# Patient Record
Sex: Male | Born: 1940 | Race: White | Hispanic: No | Marital: Married | Smoking: Former smoker
Health system: Southern US, Community
[De-identification: ages and names within clinical notes are randomized; demographics above are authoritative.]

## PROBLEM LIST (undated history)

## (undated) DIAGNOSIS — I509 Heart failure, unspecified: Secondary | ICD-10-CM

## (undated) DIAGNOSIS — E119 Type 2 diabetes mellitus without complications: Secondary | ICD-10-CM

## (undated) DIAGNOSIS — O2230 Deep phlebothrombosis in pregnancy, unspecified trimester: Secondary | ICD-10-CM

## (undated) DIAGNOSIS — G459 Transient cerebral ischemic attack, unspecified: Secondary | ICD-10-CM

## (undated) DIAGNOSIS — K219 Gastro-esophageal reflux disease without esophagitis: Secondary | ICD-10-CM

## (undated) DIAGNOSIS — I10 Essential (primary) hypertension: Secondary | ICD-10-CM

## (undated) DIAGNOSIS — I2510 Atherosclerotic heart disease of native coronary artery without angina pectoris: Secondary | ICD-10-CM

## (undated) DIAGNOSIS — I82409 Acute embolism and thrombosis of unspecified deep veins of unspecified lower extremity: Secondary | ICD-10-CM

## (undated) DIAGNOSIS — I639 Cerebral infarction, unspecified: Secondary | ICD-10-CM

## (undated) DIAGNOSIS — C969 Malignant neoplasm of lymphoid, hematopoietic and related tissue, unspecified: Secondary | ICD-10-CM

## (undated) DIAGNOSIS — Z7901 Long term (current) use of anticoagulants: Secondary | ICD-10-CM

## (undated) HISTORY — PX: CORONARY ANGIOPLASTY WITH STENT PLACEMENT: SHX49

## (undated) HISTORY — PX: BACK SURGERY: SHX140

---
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
## 2015-01-02 ENCOUNTER — Ambulatory Visit (INDEPENDENT_AMBULATORY_CARE_PROVIDER_SITE_OTHER): Payer: Medicare Other | Admitting: Podiatry

## 2015-01-02 ENCOUNTER — Encounter: Payer: Self-pay | Admitting: Podiatry

## 2015-01-02 VITALS — Ht 74.0 in | Wt 260.0 lb

## 2015-01-02 DIAGNOSIS — M2041 Other hammer toe(s) (acquired), right foot: Secondary | ICD-10-CM

## 2015-01-02 DIAGNOSIS — M79671 Pain in right foot: Secondary | ICD-10-CM

## 2015-01-02 NOTE — Progress Notes (Signed)
Subjective: 74 year old male presents complaining of pain in 2nd digit right foot, been going on off and on for the last 3 months. Has had Gout years ago and was treated and got over it.   Objective: Vascular: All pedal pulses are palpable. Mild pedal edema bilateral. Dermatologic: Thick deformed nails x 10. Neurologic: Subjective numbness and dull feeling on both feet. Pain off and on 2nd toe right x 3 months. Orthopedic: Severe HAV with bunion bilateral. Contracted 2nd toe right.  Enlarged 2nd toe right.   Assessment: Painful hammer toe right. Onychomycosis x 10. Painful feet bilateral.  Plan: May benefit from resection of enlarged phalangeal head. Need to have [place].

## 2015-01-02 NOTE — Patient Instructions (Signed)
Seen for painful toe 2nd right. Need to have nails trimmed in next appointment.

## 2015-01-11 ENCOUNTER — Ambulatory Visit (INDEPENDENT_AMBULATORY_CARE_PROVIDER_SITE_OTHER): Payer: Medicare Other | Admitting: Podiatry

## 2015-01-11 ENCOUNTER — Encounter: Payer: Self-pay | Admitting: Podiatry

## 2015-01-11 VITALS — Ht 74.0 in | Wt 260.0 lb

## 2015-01-11 DIAGNOSIS — B351 Tinea unguium: Secondary | ICD-10-CM | POA: Insufficient documentation

## 2015-01-11 DIAGNOSIS — M79606 Pain in leg, unspecified: Secondary | ICD-10-CM

## 2015-01-11 DIAGNOSIS — M79604 Pain in right leg: Secondary | ICD-10-CM

## 2015-01-11 DIAGNOSIS — M10071 Idiopathic gout, right ankle and foot: Secondary | ICD-10-CM

## 2015-01-11 DIAGNOSIS — M109 Gout, unspecified: Secondary | ICD-10-CM | POA: Insufficient documentation

## 2015-01-11 NOTE — Patient Instructions (Signed)
Seen for hypertrophic nails. Cortisone injection given to right 2nd toe.  All nails debrided. Return in 3 months or as needed.

## 2015-01-11 NOTE — Progress Notes (Signed)
Subjective: 74 year old male presents complaining of pain in 2nd digit right foot, and request for toe nails trimmed. He attempted to trim and ended up bleeding toes. He is on blood thinner.   Objective: Vascular: All pedal pulses are palpable. Mild pedal edema bilateral. Dermatologic: Thick deformed nails x 10. Neurologic: Subjective numbness and dull feeling on both feet. Pain off and on 2nd toe right x 3 months. Orthopedic: Severe HAV with bunion bilateral. Contracted 2nd toe right.  Enlarged 2nd toe right.   Assessment: Painful hammer toe right. Onychomycosis x 10. Painful feet bilateral. R/O Gouty arthritis 2nd digit right.   Plan: 2nd digit right foot injected with mixture of 4 mg Dexamethasone and  1 cc of 0.5% Marcaine plain. Patient tolerated well without difficulty.  All nails debrided.

## 2015-02-20 ENCOUNTER — Ambulatory Visit (INDEPENDENT_AMBULATORY_CARE_PROVIDER_SITE_OTHER): Payer: Medicare Other | Admitting: Podiatry

## 2015-02-20 ENCOUNTER — Encounter: Payer: Self-pay | Admitting: Podiatry

## 2015-02-20 DIAGNOSIS — D2121 Benign neoplasm of connective and other soft tissue of right lower limb, including hip: Secondary | ICD-10-CM | POA: Diagnosis not present

## 2015-02-20 DIAGNOSIS — D2120 Benign neoplasm of connective and other soft tissue of unspecified lower limb, including hip: Secondary | ICD-10-CM | POA: Insufficient documentation

## 2015-02-20 DIAGNOSIS — M79671 Pain in right foot: Secondary | ICD-10-CM

## 2015-02-20 NOTE — Patient Instructions (Signed)
Seen for right foot lesion over the 2nd toe base. Possible insect bite? Localized soft tissue swelling.  Will do hammer toe surgery on 2nd right when scheduled.

## 2015-02-20 NOTE — Progress Notes (Signed)
Subjective: 74 year old male presents complaining of a red lesion at the base of 2nd toe right for the past few days. It was hurting till yesterday and not hurting any more today.  Patient wants to have the hammer toe surgery on the 2nd right.   Objective:  Seen raised skin with pink discoloration. No skin break down. No drainage.  No associated edema on toe. No pain at this time.  Assessment: Cellular reaction with possible insect bite.  Plan:  Reviewed finding. Will discuss hammer toe surgery when he returns for the pre op.

## 2015-04-10 ENCOUNTER — Ambulatory Visit: Payer: Medicare Other | Admitting: Podiatry

## 2015-04-12 ENCOUNTER — Ambulatory Visit: Payer: No Typology Code available for payment source | Admitting: Podiatry

## 2015-05-19 ENCOUNTER — Ambulatory Visit (INDEPENDENT_AMBULATORY_CARE_PROVIDER_SITE_OTHER): Payer: Medicare Other | Admitting: Podiatry

## 2015-05-19 ENCOUNTER — Encounter: Payer: Self-pay | Admitting: Podiatry

## 2015-05-19 VITALS — Ht 74.0 in | Wt 262.0 lb

## 2015-05-19 DIAGNOSIS — M79606 Pain in leg, unspecified: Secondary | ICD-10-CM

## 2015-05-19 DIAGNOSIS — B351 Tinea unguium: Secondary | ICD-10-CM | POA: Diagnosis not present

## 2015-05-19 NOTE — Patient Instructions (Signed)
Seen for hypertrophic nails. All nails debrided. Return in 3 months or as needed.  

## 2015-05-19 NOTE — Progress Notes (Signed)
Subjective: 74 year old male presents complaining of painful nails.   Objective: Vascular: All pedal pulses are palpable. Mild pedal edema bilateral. Dermatologic: Thick deformed nails x 10. Neurologic: Subjective numbness and dull feeling on both feet. Pain off and on 2nd toe right x 3 months. Orthopedic: Severe HAV with bunion bilateral. Contracted 2nd toe right.   Assessment: Painful hammer toe right. Onychomycosis x 10. Painful feet bilateral.  Plan: Debrided all nails x 10.

## 2015-09-06 ENCOUNTER — Ambulatory Visit (INDEPENDENT_AMBULATORY_CARE_PROVIDER_SITE_OTHER): Payer: Medicare Other | Admitting: Podiatry

## 2015-09-06 ENCOUNTER — Encounter: Payer: Self-pay | Admitting: Podiatry

## 2015-09-06 VITALS — BP 145/65 | HR 74

## 2015-09-06 DIAGNOSIS — M79606 Pain in leg, unspecified: Secondary | ICD-10-CM

## 2015-09-06 DIAGNOSIS — B351 Tinea unguium: Secondary | ICD-10-CM

## 2015-09-06 NOTE — Progress Notes (Signed)
Subjective: 74 year old male presents complaining of painful thick nails. Requesting nails trimmed.  Objective: Vascular: All pedal pulses are palpable. Mild pedal edema bilateral. Dermatologic: Thick deformed nails x 10. Neurologic: Subjective numbness and dull feeling on both feet. Pain off and on 2nd toe right x 3 months. Orthopedic: Severe HAV with bunion bilateral. Contracted 2nd toe right.   Assessment: Painful hammer toe right. Onychomycosis x 10. Painful feet bilateral.  Plan: Debrided all nails x 10

## 2016-01-22 ENCOUNTER — Encounter: Payer: Self-pay | Admitting: Podiatry

## 2016-01-22 ENCOUNTER — Ambulatory Visit (INDEPENDENT_AMBULATORY_CARE_PROVIDER_SITE_OTHER): Payer: Medicare Other | Admitting: Podiatry

## 2016-01-22 VITALS — BP 143/65 | HR 70

## 2016-01-22 DIAGNOSIS — B351 Tinea unguium: Secondary | ICD-10-CM | POA: Diagnosis not present

## 2016-01-22 DIAGNOSIS — M79606 Pain in leg, unspecified: Secondary | ICD-10-CM | POA: Diagnosis not present

## 2016-01-22 NOTE — Progress Notes (Signed)
Subjective: 76 year old male presents complaining of painful thick nails. Requesting nails trimmed. Also requests his old skin cream prescribed. He had used for 10 years as skin cracks and peels off.  Objective: Vascular: All pedal pulses are palpable. Mild pedal edema bilateral. Dermatologic: Thick deformed nails x 10. Neurologic: Subjective numbness and dull feeling on both feet. Pain off and on 2nd toe right x 3 months. Orthopedic: Severe HAV with bunion bilateral. Contracted 2nd toe right.   Assessment: Painful hammer toe right. Onychomycosis x 10. Painful feet bilateral.  Plan: Debrided all nails x 10. According to existing container, prescribed Triamcinolone 0.1% mixed with Eucerin cream 1:3 ratio, 454 gm. With 3 refills.

## 2016-01-22 NOTE — Patient Instructions (Signed)
Seen for hypertrophic nails. All nails debrided. Return in 3 months or as needed.  

## 2016-04-24 ENCOUNTER — Ambulatory Visit (INDEPENDENT_AMBULATORY_CARE_PROVIDER_SITE_OTHER): Payer: Medicare Other | Admitting: Podiatry

## 2016-04-24 ENCOUNTER — Encounter: Payer: Self-pay | Admitting: Podiatry

## 2016-04-24 VITALS — BP 130/74 | HR 66

## 2016-04-24 DIAGNOSIS — M79606 Pain in leg, unspecified: Secondary | ICD-10-CM

## 2016-04-24 DIAGNOSIS — B351 Tinea unguium: Secondary | ICD-10-CM | POA: Diagnosis not present

## 2016-04-24 NOTE — Progress Notes (Signed)
Subjective: 75 year old male presents complaining of painful thick nails. Requesting nails trimmed. Been using Cortisone skin cream as needed.  Patient is on anti coagulant therapy.  Objective: Vascular: All pedal pulses are palpable. Hyperpigmented lower limbs bilateral.  Dermatologic: Thick deformed nails x 10. Neurologic: Subjective numbness and dull feeling on both feet. Pain off and on 2nd toe right x 3 months. Orthopedic: Severe HAV with bunion bilateral. Contracted 2nd toe right.   Assessment: Painful hammer toe 2nd right. Onychomycosis x 10. Painful feet bilateral.  Plan: Debrided all nails x 10. Bleeding right great medial border cleansed and dressed with Betadine solution.  Return as needed.

## 2016-04-24 NOTE — Patient Instructions (Signed)
Seen for hypertrophic nails. All nails debrided. Betadine dressing applied to bleeding right great toe. Return in 3 months or as needed.

## 2016-08-14 ENCOUNTER — Ambulatory Visit (INDEPENDENT_AMBULATORY_CARE_PROVIDER_SITE_OTHER): Payer: Medicare Other | Admitting: Podiatry

## 2016-08-14 ENCOUNTER — Encounter: Payer: Self-pay | Admitting: Podiatry

## 2016-08-14 VITALS — BP 130/70 | HR 66

## 2016-08-14 DIAGNOSIS — M79606 Pain in leg, unspecified: Secondary | ICD-10-CM | POA: Diagnosis not present

## 2016-08-14 DIAGNOSIS — B351 Tinea unguium: Secondary | ICD-10-CM

## 2016-08-14 NOTE — Progress Notes (Signed)
Subjective: 75 year old male presents complaining of painful thick nails. Requesting nails trimmed. Been using Cortisone skin cream as needed.  Patient is on anti coagulant therapy.  Objective: Vascular: All pedal pulses are palpable. Hyperpigmented lower limbs bilateral.  Dermatologic: Thick deformed nails x 10. Neurologic: Subjective numbness and dull feeling on both feet. Pain off and on 2nd toe right x 3 months. Orthopedic: Severe HAV with bunion bilateral. Contracted 2nd toe right.   Assessment: Painful hammer toe 2nd right. Onychomycosis x 10. Painful feet bilateral.  Plan: Debrided all nails x 10. Bleeding right great medial border cleansed and dressed with Betadine solution.  Return as needed.

## 2016-08-14 NOTE — Patient Instructions (Signed)
Seen for hypertrophic nails. All nails debrided. Return in 3 months or as needed.  

## 2016-11-13 ENCOUNTER — Encounter: Payer: Self-pay | Admitting: Podiatry

## 2016-11-13 ENCOUNTER — Ambulatory Visit (INDEPENDENT_AMBULATORY_CARE_PROVIDER_SITE_OTHER): Payer: Medicare Other | Admitting: Podiatry

## 2016-11-13 DIAGNOSIS — M79671 Pain in right foot: Secondary | ICD-10-CM | POA: Diagnosis not present

## 2016-11-13 DIAGNOSIS — M79606 Pain in leg, unspecified: Secondary | ICD-10-CM

## 2016-11-13 DIAGNOSIS — B351 Tinea unguium: Secondary | ICD-10-CM | POA: Diagnosis not present

## 2016-11-13 NOTE — Patient Instructions (Signed)
Seen for hypertrophic nails. All nails debrided. Return in 3 months or as needed.  

## 2016-11-14 NOTE — Progress Notes (Signed)
Subjective: 75 year old male presents complaining of painful thick nails. Requesting nails trimmed.  Objective: Vascular: All pedal pulses are palpable. Hyperpigmented lower limbs bilateral.  Dermatologic: Thick deformed nails x 10. Thin hyperpigmented skin in both feet from Venous stasis and anticoagulant therapy.  Neurologic: Subjective numbness and dull feeling on both feet. Pain off and on 2nd toe right x 3 months. Orthopedic: Severe HAV with bunion bilateral. Contracted 2nd toe right.   Assessment: Painful hammer toe 2nd right. Onychomycosis x 10. Painful feet bilateral.  Plan: Debrided all nails x 10. Return as needed.

## 2017-03-12 ENCOUNTER — Encounter: Payer: Self-pay | Admitting: Podiatry

## 2017-03-12 ENCOUNTER — Ambulatory Visit (INDEPENDENT_AMBULATORY_CARE_PROVIDER_SITE_OTHER): Payer: Medicare Other | Admitting: Podiatry

## 2017-03-12 ENCOUNTER — Ambulatory Visit: Payer: Medicare Other | Admitting: Podiatry

## 2017-03-12 DIAGNOSIS — M79673 Pain in unspecified foot: Secondary | ICD-10-CM

## 2017-03-12 DIAGNOSIS — B351 Tinea unguium: Secondary | ICD-10-CM | POA: Diagnosis not present

## 2017-03-12 DIAGNOSIS — M79606 Pain in leg, unspecified: Secondary | ICD-10-CM

## 2017-03-12 NOTE — Patient Instructions (Signed)
Seen for hypertrophic nails. All nails debrided. Return in 3 months or as needed.  

## 2017-03-12 NOTE — Progress Notes (Signed)
Subjective: 76 year old male presents complaining of painful thick nails. Requesting nails trimmed. Still using Cortisone cream on feet when skin flares up.   Objective: Vascular: All pedal pulses are palpable. Hyperpigmented lower limbs bilateral.  Dermatologic: Thick deformed nails x 10. Thin hyperpigmented skin in both feet from Venous stasis and anticoagulant therapy.  Neurologic: Subjective numbness and dull feeling on both feet. Pain off and on at the medial base with firm dark brown mass about 0.3 cm in diameter 2nd toe right x 3 months. Orthopedic: Severe HAV with bunion bilateral. Contracted 2nd toe right.   Assessment: Painful hammer toe with possible fibrous angioma medial base 2nd right. Onychomycosis x 10. Painful feet bilateral.  Plan: Debrided all nails x 10. Applied Vitamin A cream on both lower legs. Return as needed.

## 2017-06-19 ENCOUNTER — Encounter: Payer: Self-pay | Admitting: Podiatry

## 2017-06-19 ENCOUNTER — Ambulatory Visit (INDEPENDENT_AMBULATORY_CARE_PROVIDER_SITE_OTHER): Payer: Medicare Other | Admitting: Podiatry

## 2017-06-19 DIAGNOSIS — M79606 Pain in leg, unspecified: Secondary | ICD-10-CM | POA: Diagnosis not present

## 2017-06-19 DIAGNOSIS — B351 Tinea unguium: Secondary | ICD-10-CM | POA: Diagnosis not present

## 2017-06-19 NOTE — Progress Notes (Signed)
Subjective: 76 year old male presents complaining of painful thick nails. Requesting nails trimmed. Denies any new problems.  Objective: Vascular: All pedal pulses are palpable. Hyperpigmented lower limbs bilateral.  Dermatologic: Thick deformed nails x 10. Thin hyperpigmented skin in both feet from Venous stasis and anticoagulant therapy.  Neurologic: Subjective numbness and dull feeling on both feet. Orthopedic: Severe HAV with bunion bilateral. Contracted 2nd toe right.   Assessment: Onychomycosis x 10. Painful feet bilateral.  Plan: Debrided all nails x 10. Return as needed.

## 2017-06-19 NOTE — Patient Instructions (Signed)
Seen for hypertrophic nails. All nails debrided. Return in 3 months or as needed.  

## 2017-09-18 ENCOUNTER — Encounter: Payer: Self-pay | Admitting: Podiatry

## 2017-09-18 ENCOUNTER — Ambulatory Visit (INDEPENDENT_AMBULATORY_CARE_PROVIDER_SITE_OTHER): Payer: Medicare Other | Admitting: Podiatry

## 2017-09-18 DIAGNOSIS — M79606 Pain in leg, unspecified: Secondary | ICD-10-CM

## 2017-09-18 DIAGNOSIS — B351 Tinea unguium: Secondary | ICD-10-CM

## 2017-09-18 NOTE — Progress Notes (Signed)
Subjective: 76 y.o. year old male patient presents complaining of painful nails. Patient requests toe nails trimmed.  Denies any new problem other than on going neuropathy foot pain.  Objective: Dermatologic: Thick yellow deformed nails x 10. Vascular: Pedal pulses are all palpable. Hyperpigmented purple and red skin with poor skin texture from venous stasis and anti coagulant theatment. Orthopedic: Severe bunion deformity bilateral. Contracted 2nd digit right. Neurologic: Subjective numbness, tingling and pain bilateral.  Assessment: Dystrophic mycotic nails x 10. Peripheral neuropathy. Chronic Venous stasis. On anti coagulant therapy.  Treatment: All mycotic nails debrided and grinded.  Return in 3 months or as needed.

## 2017-09-18 NOTE — Patient Instructions (Signed)
Seen for hypertrophic nails. All nails debrided. Return in 3 months or as needed.  

## 2017-12-18 ENCOUNTER — Encounter: Payer: Self-pay | Admitting: Podiatry

## 2017-12-18 ENCOUNTER — Ambulatory Visit (INDEPENDENT_AMBULATORY_CARE_PROVIDER_SITE_OTHER): Payer: Medicare Other | Admitting: Podiatry

## 2017-12-18 DIAGNOSIS — M79672 Pain in left foot: Secondary | ICD-10-CM | POA: Diagnosis not present

## 2017-12-18 DIAGNOSIS — M79671 Pain in right foot: Secondary | ICD-10-CM | POA: Diagnosis not present

## 2017-12-18 DIAGNOSIS — B351 Tinea unguium: Secondary | ICD-10-CM | POA: Diagnosis not present

## 2017-12-18 NOTE — Patient Instructions (Signed)
Seen for hypertrophic nails. All nails debrided. Return in 3 months or sooner if needed.  

## 2017-12-18 NOTE — Progress Notes (Signed)
Subjective: 77 y.o. year old male patient presents complaining of painful nails and request to be trimmed. Currently having on going neuropathy foot pain. On anticoagulant therapy.  Objective: Dermatologic: Thick yellow deformed nails x 10. Vascular: Pedal pulses are all palpable. Hyperpigmented skin with venous stasis dermatitis and anticoagulant therapy. 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 Neurologic: Positive for subjective numbness and tingling with pain on both feet.  Assessment: Dystrophic mycotic nails x 10. Peripheral neuropathy. Venous stasis dermatitis.  Treatment: All mycotic nails debrided.  Return in 3 months or as needed.

## 2018-03-19 ENCOUNTER — Ambulatory Visit (INDEPENDENT_AMBULATORY_CARE_PROVIDER_SITE_OTHER): Payer: Medicare Other | Admitting: Podiatry

## 2018-03-19 DIAGNOSIS — M79672 Pain in left foot: Secondary | ICD-10-CM

## 2018-03-19 DIAGNOSIS — G609 Hereditary and idiopathic neuropathy, unspecified: Secondary | ICD-10-CM | POA: Diagnosis not present

## 2018-03-19 DIAGNOSIS — M79671 Pain in right foot: Secondary | ICD-10-CM

## 2018-03-19 DIAGNOSIS — B351 Tinea unguium: Secondary | ICD-10-CM | POA: Diagnosis not present

## 2018-03-19 NOTE — Patient Instructions (Signed)
Seen for hypertrophic nails. All nails debrided. Return in 3 months or as needed.  

## 2018-03-19 NOTE — Progress Notes (Signed)
Subjective: 77 y.o. year old male patient presents complaining of painful nails. Patient requests toe nails trimmed.  Patient is on anticoagulant therapy.  Objective: Dermatologic: Thick yellow deformed nails x 10. Vascular: Pedal pulses are all palpable. Venous stasis dermatis without open skin. Orthopedic: Severe hallux valgus with bunion deformity. 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 Neurologic: Subjective numbness and tingling on both feet.  Assessment: Dystrophic mycotic nails x 10. Peripheral neuropathy. Venous stasis dermatitis.  Treatment: All mycotic nails debrided.  Return in 3 months or as needed.

## 2018-03-21 ENCOUNTER — Encounter: Payer: Self-pay | Admitting: Podiatry

## 2018-06-18 ENCOUNTER — Ambulatory Visit (INDEPENDENT_AMBULATORY_CARE_PROVIDER_SITE_OTHER): Payer: Medicare Other | Admitting: Podiatry

## 2018-06-18 ENCOUNTER — Encounter: Payer: Self-pay | Admitting: Podiatry

## 2018-06-18 DIAGNOSIS — M79671 Pain in right foot: Secondary | ICD-10-CM | POA: Diagnosis not present

## 2018-06-18 DIAGNOSIS — B351 Tinea unguium: Secondary | ICD-10-CM | POA: Diagnosis not present

## 2018-06-18 DIAGNOSIS — M79672 Pain in left foot: Secondary | ICD-10-CM | POA: Diagnosis not present

## 2018-06-18 NOTE — Patient Instructions (Signed)
Seen for hypertrophic nails. All nails debrided. Return in 3 months or as needed.  

## 2018-06-18 NOTE — Progress Notes (Signed)
Subjective: 77 y.o. year old male patient presents complaining of painful nails. Patient requests toe nails trimmed.   Objective: Dermatologic: Thick yellow deformed nails x 10. Dark discolored poor textured skin both lower limbs. Vascular: Pedal pulses are not palpable. Orthopedic: Contracted lesser digits bilateral. Neurologic: All epicritic and tactile sensations grossly intact.  Assessment: Dystrophic mycotic nails x 10. Venous stasis dermatitis.  Treatment: All mycotic nails debrided.  Return in 3 months or as needed.

## 2018-09-23 ENCOUNTER — Ambulatory Visit: Payer: Medicare Other | Admitting: Podiatry

## 2018-09-30 ENCOUNTER — Ambulatory Visit: Payer: Medicare Other | Admitting: Podiatry

## 2019-05-31 ENCOUNTER — Inpatient Hospital Stay (HOSPITAL_COMMUNITY)
Admission: EM | Admit: 2019-05-31 | Discharge: 2019-06-07 | DRG: 065 | Disposition: A | Discharge status: Rehabilitation Facility | Payer: Medicare HMO | Attending: Family Medicine | Admitting: Family Medicine

## 2019-05-31 ENCOUNTER — Emergency Department (HOSPITAL_COMMUNITY): Payer: Medicare HMO

## 2019-05-31 DIAGNOSIS — R29810 Facial weakness: Secondary | ICD-10-CM | POA: Diagnosis present

## 2019-05-31 DIAGNOSIS — G460 Middle cerebral artery syndrome: Secondary | ICD-10-CM | POA: Diagnosis present

## 2019-05-31 DIAGNOSIS — I69351 Hemiplegia and hemiparesis following cerebral infarction affecting right dominant side: Secondary | ICD-10-CM | POA: Diagnosis not present

## 2019-05-31 DIAGNOSIS — I2510 Atherosclerotic heart disease of native coronary artery without angina pectoris: Secondary | ICD-10-CM | POA: Diagnosis present

## 2019-05-31 DIAGNOSIS — I63412 Cerebral infarction due to embolism of left middle cerebral artery: Principal | ICD-10-CM | POA: Diagnosis present

## 2019-05-31 DIAGNOSIS — E1142 Type 2 diabetes mellitus with diabetic polyneuropathy: Secondary | ICD-10-CM | POA: Diagnosis present

## 2019-05-31 DIAGNOSIS — G8191 Hemiplegia, unspecified affecting right dominant side: Secondary | ICD-10-CM | POA: Diagnosis present

## 2019-05-31 DIAGNOSIS — K148 Other diseases of tongue: Secondary | ICD-10-CM | POA: Diagnosis present

## 2019-05-31 DIAGNOSIS — Z8249 Family history of ischemic heart disease and other diseases of the circulatory system: Secondary | ICD-10-CM

## 2019-05-31 DIAGNOSIS — R7309 Other abnormal glucose: Secondary | ICD-10-CM | POA: Diagnosis not present

## 2019-05-31 DIAGNOSIS — M109 Gout, unspecified: Secondary | ICD-10-CM | POA: Diagnosis present

## 2019-05-31 DIAGNOSIS — R29716 NIHSS score 16: Secondary | ICD-10-CM | POA: Diagnosis not present

## 2019-05-31 DIAGNOSIS — E1151 Type 2 diabetes mellitus with diabetic peripheral angiopathy without gangrene: Secondary | ICD-10-CM | POA: Diagnosis present

## 2019-05-31 DIAGNOSIS — I10 Essential (primary) hypertension: Secondary | ICD-10-CM | POA: Diagnosis present

## 2019-05-31 DIAGNOSIS — R911 Solitary pulmonary nodule: Secondary | ICD-10-CM

## 2019-05-31 DIAGNOSIS — Z794 Long term (current) use of insulin: Secondary | ICD-10-CM

## 2019-05-31 DIAGNOSIS — M25561 Pain in right knee: Secondary | ICD-10-CM | POA: Diagnosis not present

## 2019-05-31 DIAGNOSIS — I4891 Unspecified atrial fibrillation: Secondary | ICD-10-CM | POA: Diagnosis present

## 2019-05-31 DIAGNOSIS — Z79891 Long term (current) use of opiate analgesic: Secondary | ICD-10-CM

## 2019-05-31 DIAGNOSIS — R471 Dysarthria and anarthria: Secondary | ICD-10-CM | POA: Diagnosis present

## 2019-05-31 DIAGNOSIS — I440 Atrioventricular block, first degree: Secondary | ICD-10-CM | POA: Diagnosis present

## 2019-05-31 DIAGNOSIS — K219 Gastro-esophageal reflux disease without esophagitis: Secondary | ICD-10-CM | POA: Diagnosis present

## 2019-05-31 DIAGNOSIS — E785 Hyperlipidemia, unspecified: Secondary | ICD-10-CM | POA: Diagnosis present

## 2019-05-31 DIAGNOSIS — R29719 NIHSS score 19: Secondary | ICD-10-CM | POA: Diagnosis present

## 2019-05-31 DIAGNOSIS — I69391 Dysphagia following cerebral infarction: Secondary | ICD-10-CM | POA: Diagnosis not present

## 2019-05-31 DIAGNOSIS — Z7901 Long term (current) use of anticoagulants: Secondary | ICD-10-CM | POA: Diagnosis not present

## 2019-05-31 DIAGNOSIS — I6602 Occlusion and stenosis of left middle cerebral artery: Secondary | ICD-10-CM | POA: Diagnosis not present

## 2019-05-31 DIAGNOSIS — Z86718 Personal history of other venous thrombosis and embolism: Secondary | ICD-10-CM

## 2019-05-31 DIAGNOSIS — Z87891 Personal history of nicotine dependence: Secondary | ICD-10-CM

## 2019-05-31 DIAGNOSIS — I63419 Cerebral infarction due to embolism of unspecified middle cerebral artery: Secondary | ICD-10-CM | POA: Diagnosis not present

## 2019-05-31 DIAGNOSIS — R4182 Altered mental status, unspecified: Secondary | ICD-10-CM

## 2019-05-31 DIAGNOSIS — R531 Weakness: Secondary | ICD-10-CM | POA: Diagnosis not present

## 2019-05-31 DIAGNOSIS — R32 Unspecified urinary incontinence: Secondary | ICD-10-CM | POA: Diagnosis present

## 2019-05-31 DIAGNOSIS — I639 Cerebral infarction, unspecified: Secondary | ICD-10-CM | POA: Diagnosis present

## 2019-05-31 DIAGNOSIS — E876 Hypokalemia: Secondary | ICD-10-CM | POA: Diagnosis present

## 2019-05-31 DIAGNOSIS — Z1159 Encounter for screening for other viral diseases: Secondary | ICD-10-CM | POA: Diagnosis not present

## 2019-05-31 DIAGNOSIS — D62 Acute posthemorrhagic anemia: Secondary | ICD-10-CM | POA: Diagnosis not present

## 2019-05-31 DIAGNOSIS — Z66 Do not resuscitate: Secondary | ICD-10-CM | POA: Diagnosis present

## 2019-05-31 DIAGNOSIS — R29720 NIHSS score 20: Secondary | ICD-10-CM | POA: Diagnosis not present

## 2019-05-31 DIAGNOSIS — Z7902 Long term (current) use of antithrombotics/antiplatelets: Secondary | ICD-10-CM

## 2019-05-31 DIAGNOSIS — R4701 Aphasia: Secondary | ICD-10-CM | POA: Diagnosis present

## 2019-05-31 DIAGNOSIS — Z79899 Other long term (current) drug therapy: Secondary | ICD-10-CM

## 2019-05-31 DIAGNOSIS — E1169 Type 2 diabetes mellitus with other specified complication: Secondary | ICD-10-CM | POA: Diagnosis not present

## 2019-05-31 DIAGNOSIS — R0902 Hypoxemia: Secondary | ICD-10-CM | POA: Diagnosis present

## 2019-05-31 DIAGNOSIS — R29715 NIHSS score 15: Secondary | ICD-10-CM | POA: Diagnosis not present

## 2019-05-31 DIAGNOSIS — I63511 Cerebral infarction due to unspecified occlusion or stenosis of right middle cerebral artery: Secondary | ICD-10-CM | POA: Diagnosis not present

## 2019-05-31 DIAGNOSIS — R410 Disorientation, unspecified: Secondary | ICD-10-CM | POA: Diagnosis not present

## 2019-05-31 DIAGNOSIS — Z955 Presence of coronary angioplasty implant and graft: Secondary | ICD-10-CM

## 2019-05-31 DIAGNOSIS — I69320 Aphasia following cerebral infarction: Secondary | ICD-10-CM | POA: Diagnosis not present

## 2019-05-31 DIAGNOSIS — R0989 Other specified symptoms and signs involving the circulatory and respiratory systems: Secondary | ICD-10-CM | POA: Diagnosis not present

## 2019-05-31 DIAGNOSIS — E871 Hypo-osmolality and hyponatremia: Secondary | ICD-10-CM | POA: Diagnosis not present

## 2019-05-31 DIAGNOSIS — G8929 Other chronic pain: Secondary | ICD-10-CM | POA: Diagnosis not present

## 2019-05-31 DIAGNOSIS — E669 Obesity, unspecified: Secondary | ICD-10-CM | POA: Diagnosis not present

## 2019-05-31 HISTORY — DX: Atherosclerotic heart disease of native coronary artery without angina pectoris: I25.10

## 2019-05-31 HISTORY — DX: Essential (primary) hypertension: I10

## 2019-05-31 HISTORY — DX: Type 2 diabetes mellitus without complications: E11.9

## 2019-05-31 HISTORY — DX: Transient cerebral ischemic attack, unspecified: G45.9

## 2019-05-31 HISTORY — DX: Long term (current) use of anticoagulants: Z79.01

## 2019-05-31 LAB — COMPREHENSIVE METABOLIC PANEL
ALT: 23 U/L (ref 0–44)
AST: 25 U/L (ref 15–41)
Albumin: 4 g/dL (ref 3.5–5.0)
Alkaline Phosphatase: 74 U/L (ref 38–126)
Anion gap: 12 (ref 5–15)
BUN: 11 mg/dL (ref 8–23)
CO2: 22 mmol/L (ref 22–32)
Calcium: 8.9 mg/dL (ref 8.9–10.3)
Chloride: 106 mmol/L (ref 98–111)
Creatinine, Ser: 1.08 mg/dL (ref 0.61–1.24)
GFR calc Af Amer: >60 mL/min (ref >60)
GFR calc non Af Amer: >60 mL/min (ref >60)
Glucose, Bld: 202 mg/dL — ABNORMAL HIGH (ref 70–99)
Potassium: 3.7 mmol/L (ref 3.5–5.1)
Sodium: 140 mmol/L (ref 135–145)
Total Bilirubin: 1 mg/dL (ref 0.3–1.2)
Total Protein: 6.4 g/dL — ABNORMAL LOW (ref 6.5–8.1)

## 2019-05-31 LAB — CBC
HCT: 39.1 % (ref 39.0–52.0)
Hemoglobin: 12.9 g/dL — ABNORMAL LOW (ref 13.0–17.0)
MCH: 28.5 pg (ref 26.0–34.0)
MCHC: 33 g/dL (ref 30.0–36.0)
MCV: 86.3 fL (ref 80.0–100.0)
Platelets: 269 10*3/uL (ref 150–400)
RBC: 4.53 MIL/uL (ref 4.22–5.81)
RDW: 15.6 % — ABNORMAL HIGH (ref 11.5–15.5)
WBC: 5.7 10*3/uL (ref 4.0–10.5)
nRBC: 0 % (ref 0.0–0.2)

## 2019-05-31 LAB — I-STAT CHEM 8, ED
BUN: 11 mg/dL (ref 8–23)
Calcium, Ion: 1.01 mmol/L — ABNORMAL LOW (ref 1.15–1.40)
Chloride: 104 mmol/L (ref 98–111)
Creatinine, Ser: 0.9 mg/dL (ref 0.61–1.24)
Glucose, Bld: 199 mg/dL — ABNORMAL HIGH (ref 70–99)
HCT: 37 % — ABNORMAL LOW (ref 39.0–52.0)
Hemoglobin: 12.6 g/dL — ABNORMAL LOW (ref 13.0–17.0)
Potassium: 3.6 mmol/L (ref 3.5–5.1)
Sodium: 140 mmol/L (ref 135–145)
TCO2: 24 mmol/L (ref 22–32)

## 2019-05-31 LAB — DIFFERENTIAL
Abs Immature Granulocytes: 0.04 10*3/uL (ref 0.00–0.07)
Basophils Absolute: 0.1 10*3/uL (ref 0.0–0.1)
Basophils Relative: 1 %
Eosinophils Absolute: 0.1 10*3/uL (ref 0.0–0.5)
Eosinophils Relative: 2 %
Immature Granulocytes: 1 %
Lymphocytes Relative: 37 %
Lymphs Abs: 2.1 10*3/uL (ref 0.7–4.0)
Monocytes Absolute: 0.4 10*3/uL (ref 0.1–1.0)
Monocytes Relative: 7 %
Neutro Abs: 3 10*3/uL (ref 1.7–7.7)
Neutrophils Relative %: 52 %

## 2019-05-31 LAB — SARS CORONAVIRUS 2 BY RT PCR (HOSPITAL ORDER, PERFORMED IN ~~LOC~~ HOSPITAL LAB): SARS Coronavirus 2: NEGATIVE

## 2019-05-31 LAB — ETHANOL: Alcohol, Ethyl (B): <10 mg/dL (ref <10)

## 2019-05-31 LAB — PROTIME-INR
INR: 1.1 (ref 0.8–1.2)
Prothrombin Time: 14.2 seconds (ref 11.4–15.2)

## 2019-05-31 LAB — CBG MONITORING, ED: Glucose-Capillary: 169 mg/dL — ABNORMAL HIGH (ref 70–99)

## 2019-05-31 LAB — GLUCOSE, CAPILLARY: Glucose-Capillary: 204 mg/dL — ABNORMAL HIGH (ref 70–99)

## 2019-05-31 LAB — APTT: aPTT: 31 seconds (ref 24–36)

## 2019-05-31 MED ORDER — ACETAMINOPHEN 160 MG/5ML PO SOLN: 650.0000 mg | ORAL | Status: DC | PRN

## 2019-05-31 MED ORDER — IOHEXOL 350 MG/ML SOLN
75.0000 mL | Freq: Once | INTRAVENOUS | Status: AC | PRN
  Administered 2019-05-31: 75 mL via INTRAVENOUS

## 2019-05-31 MED ORDER — INSULIN ASPART 100 UNIT/ML ~~LOC~~ SOLN
0.0000 [IU] | SUBCUTANEOUS | Status: DC
  Administered 2019-05-31: 3 [IU] via SUBCUTANEOUS
  Administered 2019-06-01 – 2019-06-02 (×8): 2 [IU] via SUBCUTANEOUS

## 2019-05-31 MED ORDER — PANTOPRAZOLE SODIUM 40 MG IV SOLR
40.0000 mg | Freq: Every day | INTRAVENOUS | Status: DC
  Administered 2019-06-01: 40 mg via INTRAVENOUS
  Filled 2019-05-31: qty 40

## 2019-05-31 MED ORDER — ENOXAPARIN SODIUM 40 MG/0.4ML ~~LOC~~ SOLN
40.0000 mg | Freq: Every day | SUBCUTANEOUS | Status: DC
  Administered 2019-06-01 – 2019-06-02 (×2): 40 mg via SUBCUTANEOUS
  Filled 2019-05-31 (×2): qty 0.4

## 2019-05-31 MED ORDER — ACETAMINOPHEN 650 MG RE SUPP: 650.0000 mg | RECTAL | Status: DC | PRN

## 2019-05-31 MED ORDER — ACETAMINOPHEN 325 MG PO TABS: 650.0000 mg | ORAL_TABLET | ORAL | Status: DC | PRN

## 2019-05-31 MED ORDER — SODIUM CHLORIDE 0.9 % IV SOLN
INTRAVENOUS | Status: AC
  Administered 2019-05-31: via INTRAVENOUS

## 2019-05-31 MED ORDER — STROKE: EARLY STAGES OF RECOVERY BOOK
Freq: Once | Status: AC
  Administered 2019-05-31
  Filled 2019-05-31: qty 1

## 2019-05-31 NOTE — ED Notes (Signed)
ED TO INPATIENT HANDOFF REPORT  ED Nurse Name and Phone #:  Kelby Fam [phone]  S Name/Age/Gender Luke Rivera 78 y.o. male Room/Bed: 033C/033C  Code Status   Code Status: DNR  Home/SNF/Other Home Patient oriented to: self Is this baseline? No   Triage Complete: Triage complete  Chief Complaint stroke  Triage Note Pt presents to ED from home by [facility]. Per EMS family reports at 1915 today pt began gargled speech, weakness. Upon EMS arrival R facial droop and R arm drift. EMS VSS.   Allergies No Known Allergies  Level of Care/Admitting Diagnosis ED Disposition    ED Disposition Condition [facility]: [place] [[id number]]  Level of Care: Telemetry Medical [104]  Covid Evaluation: Screening Protocol (No Symptoms)  Diagnosis: CVA (cerebral vascular accident) [facility]) [[id number]]  Admitting Physician: Danna Hefty [[id number]]  Attending Physician: Dorcas Mcmurray L [[id number]]  Estimated length of stay: past midnight tomorrow  Certification:: I certify this patient will need inpatient services for at least 2 midnights  PT Class (Do Not Modify): Inpatient [101]  PT Acc Code (Do Not Modify): Private [1]       B Medical/Surgery History No past medical history on file.    A IV Location/Drains/Wounds Patient Lines/Drains/Airways Status   Active Line/Drains/Airways    Name:   Placement date:   Placement time:   Site:   Days:   Peripheral IV 05/31/19 Anterior;Distal;Left;Upper Antecubital   05/31/19    2005    Antecubital   less than 1          Intake/Output Last 24 hours No intake or output data in the 24 hours ending 05/31/19 2238  Labs/Imaging Results for orders placed or performed during the hospital encounter of 05/31/19 (from the past 48 hour(s))  CBG monitoring, ED     Status: Abnormal   Collection Time: 05/31/19  8:07 PM  Result Value Ref Range   Glucose-Capillary 169 (H) 70 - 99 mg/dL  Ethanol     Status: None   Collection  Time: 05/31/19  8:09 PM  Result Value Ref Range   Alcohol, Ethyl (B) <10 <10 mg/dL    Comment: (NOTE) Lowest detectable limit for serum alcohol is 10 mg/dL. For medical purposes only. Performed at [facility], [place] [address], [place], [place] [address]   Protime-INR     Status: None   Collection Time: 05/31/19  8:09 PM  Result Value Ref Range   Prothrombin Time 14.2 11.4 - 15.2 seconds   INR 1.1 0.8 - 1.2    Comment: (NOTE) INR goal varies based on device and disease states. Performed at [facility], [place] [address], [place], [place] [address]   APTT     Status: None   Collection Time: 05/31/19  8:09 PM  Result Value Ref Range   aPTT 31 24 - 36 seconds    Comment: Performed at [facility] [address] [place], [place] [address]  CBC     Status: Abnormal   Collection Time: 05/31/19  8:09 PM  Result Value Ref Range   WBC 5.7 4.0 - 10.5 K/uL   RBC 4.53 4.22 - 5.81 MIL/uL   Hemoglobin 12.9 (L) 13.0 - 17.0 g/dL   HCT 39.1 39.0 - 52.0 %   MCV 86.3 80.0 - 100.0 fL   MCH 28.5 26.0 - 34.0 pg   MCHC 33.0 30.0 - 36.0 g/dL   RDW 15.6 (H) 11.5 -  15.5 %   Platelets 269 150 - 400 K/uL   nRBC 0.0 0.0 - 0.2 %    Comment: Performed at [facility], [place] [address] [place], [place] [address]  Differential     Status: None   Collection Time: 05/31/19  8:09 PM  Result Value Ref Range   Neutrophils Relative % 52 %   Neutro Abs 3.0 1.7 - 7.7 K/uL   Lymphocytes Relative 37 %   Lymphs Abs 2.1 0.7 - 4.0 K/uL   Monocytes Relative 7 %   Monocytes Absolute 0.4 0.1 - 1.0 K/uL   Eosinophils Relative 2 %   Eosinophils Absolute 0.1 0.0 - 0.5 K/uL   Basophils Relative 1 %   Basophils Absolute 0.1 0.0 - 0.1 K/uL   Immature Granulocytes 1 %   Abs Immature Granulocytes 0.04 0.00 - 0.07 K/uL    Comment: Performed at [facility], [place] [address], [place], [place] [address]  Comprehensive metabolic panel     Status: Abnormal   Collection Time: 05/31/19  8:09 PM   Result Value Ref Range   Sodium 140 135 - 145 mmol/L   Potassium 3.7 3.5 - 5.1 mmol/L   Chloride 106 98 - 111 mmol/L   CO2 22 22 - 32 mmol/L   Glucose, Bld 202 (H) 70 - 99 mg/dL   BUN 11 8 - 23 mg/dL   Creatinine, Ser 1.08 0.61 - 1.24 mg/dL   Calcium 8.9 8.9 - 10.3 mg/dL   Total Protein 6.4 (L) 6.5 - 8.1 g/dL   Albumin 4.0 3.5 - 5.0 g/dL   AST 25 15 - 41 U/L   ALT 23 0 - 44 U/L   Alkaline Phosphatase 74 38 - 126 U/L   Total Bilirubin 1.0 0.3 - 1.2 mg/dL   GFR calc non Af Amer >60 >60 mL/min   GFR calc Af Amer >60 >60 mL/min   Anion gap 12 5 - 15    Comment: Performed at [facility] [address], [place], [place] [address]  I-stat chem 8, ED     Status: Abnormal   Collection Time: 05/31/19  8:12 PM  Result Value Ref Range   Sodium 140 135 - 145 mmol/L   Potassium 3.6 3.5 - 5.1 mmol/L   Chloride 104 98 - 111 mmol/L   BUN 11 8 - 23 mg/dL   Creatinine, Ser 0.90 0.61 - 1.24 mg/dL   Glucose, Bld 199 (H) 70 - 99 mg/dL   Calcium, Ion 1.01 (L) 1.15 - 1.40 mmol/L   TCO2 24 22 - 32 mmol/L   Hemoglobin 12.6 (L) 13.0 - 17.0 g/dL   HCT 37.0 (L) 39.0 - 52.0 %  SARS Coronavirus 2 (CEPHEID - Performed in [place] hospital lab), Hosp Order     Status: None   Collection Time: 05/31/19  8:13 PM   Specimen: Nasopharyngeal Swab  Result Value Ref Range   SARS Coronavirus 2 NEGATIVE NEGATIVE    Comment: (NOTE) If result is NEGATIVE SARS-CoV-2 target nucleic acids are NOT DETECTED. The SARS-CoV-2 RNA is generally detectable in upper and lower  respiratory specimens during the acute phase of infection. The lowest  concentration of SARS-CoV-2 viral copies this assay can detect is 250  copies / mL. A negative result does not preclude SARS-CoV-2 infection  and should not be used as the sole basis for treatment or other  patient management decisions.  A negative result may occur with  improper specimen collection / handling,  submission of specimen other  than nasopharyngeal swab,  presence of viral mutation(s) within the  areas targeted by this assay, and inadequate number of viral copies  (<250 copies / mL). A negative result must be combined with clinical  observations, patient history, and epidemiological information. If result is POSITIVE SARS-CoV-2 target nucleic acids are DETECTED. The SARS-CoV-2 RNA is generally detectable in upper and lower  respiratory specimens dur ing the acute phase of infection.  Positive  results are indicative of active infection with SARS-CoV-2.  Clinical  correlation with patient history and other diagnostic information is  necessary to determine patient infection status.  Positive results do  not rule out bacterial infection or co-infection with other viruses. If result is PRESUMPTIVE POSTIVE SARS-CoV-2 nucleic acids MAY BE PRESENT.   A presumptive positive result was obtained on the submitted specimen  and confirmed on repeat testing.  While 2019 novel coronavirus  (SARS-CoV-2) nucleic acids may be present in the submitted sample  additional confirmatory testing may be necessary for epidemiological  and / or clinical management purposes  to differentiate between  SARS-CoV-2 and other Sarbecovirus currently known to infect humans.  If clinically indicated additional testing with an alternate test  methodology [phone]) is advised. The SARS-CoV-2 RNA is generally  detectable in upper and lower respiratory sp ecimens during the acute  phase of infection. The expected result is Negative. Fact Sheet for Patients:  StrictlyIdeas.no Fact Sheet for Healthcare Providers: BankingDealers.co.za This test is not yet approved or cleared by the [place] FDA and has been authorized for detection and/or diagnosis of SARS-CoV-2 by FDA under an Emergency Use Authorization (EUA).  This EUA will remain in effect (meaning this test can be used) for the duration of the COVID-19 declaration under  Section 564(b)(1) of the Act, 21 U.S.C. section 360bbb-3(b)(1), unless the authorization is terminated or revoked sooner. Performed at [facility], [place] [address], [place], [place] [address]    Ct Angio Head W Or Wo Contrast  Result Date: 05/31/2019 CLINICAL DATA:  Initial evaluation for possible stroke, right-sided deficits. EXAM: CT ANGIOGRAPHY HEAD AND NECK TECHNIQUE: Multidetector CT imaging of the head and neck was performed using the standard protocol during bolus administration of intravenous contrast. Multiplanar CT image reconstructions and MIPs were obtained to evaluate the vascular anatomy. Carotid stenosis measurements (when applicable) are obtained utilizing NASCET criteria, using the distal internal carotid diameter as the denominator. CONTRAST:  105mL OMNIPAQUE IOHEXOL 350 MG/ML SOLN COMPARISON:  None available. FINDINGS: CT HEAD FINDINGS Brain: Generalized age-related cerebral atrophy with mild chronic small vessel ischemic disease. 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 There is question of subtle evolving hypodensity involving the supra ganglionic cortical gray matter, M5 distribution (series 6, image 43). No other definite acute large vessel territory infarct. No acute intracranial hemorrhage. No mass lesion or midline shift. No hydrocephalus. No extra-axial fluid collection. Vascular: No hyperdense vessel. Calcified atherosclerosis present at skull base. Skull: Scalp soft tissues and calvarium within normal limits. Sinuses/Orbits: Globes and orbital soft tissues within normal limits. Chronic right sphenoid sinus disease. Mucosal thickening noted within the inferior left maxillary sinus. Paranasal sinuses are otherwise clear. Small right mastoid effusion noted. Other: None. ASPECTS [facility] Stroke Program Early CT Score) - Ganglionic level infarction (caudate, lentiform nuclei, internal capsule, insula,  M1-M3 cortex): 7 - Supraganglionic infarction (M4-M6 cortex): 2 Total score (0-10 with 10 being normal): 9 Review of the MIP images confirms the above  findings CTA NECK FINDINGS Aortic arch: Visualized arch of normal caliber with normal 3 vessel morphology. Mild atheromatous change about the origin of the great vessels without hemodynamically significant stenosis. Visualized subclavian arteries widely patent. Right carotid system: Right common carotid artery patent from its origin to the bifurcation without stenosis. Scattered mixed plaque about the proximal right ICA with associated stenosis of up to 50% by NASCET criteria. Right ICA otherwise widely patent to the skull base. Left carotid system: Left common carotid artery patent from its origin to the bifurcation without flow-limiting stenosis. Eccentric calcified plaque at the origin of the left ICA with associated stenosis of approximately 65-70% by NASCET criteria. Left ICA otherwise widely patent to the skull base. Vertebral arteries: Both of the vertebral arteries arise from the subclavian arteries. Dominant left vertebral artery widely patent within the neck. 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 Distal reconstitution via muscular branches at the distal right V2 segment. Right vertebral patent distally to the skull base without stenosis or other abnormality. Skeleton: No acute osseous finding. No discrete lytic or blastic osseous lesions. 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 Other neck: No other acute soft tissue abnormality within the neck. Upper chest: Upper lobe predominant emphysema. Superimposed 6 mm left upper lobe nodule (series 5, image 176). Visualized upper chest demonstrates no other acute abnormality. Review of the MIP images confirms the above findings CTA HEAD FINDINGS Anterior circulation: Focal atheromatous plaque within the petrous left ICA with mild stenosis. Petrous right ICA  widely patent. Scattered atheromatous plaque within the cavernous/supraclinoid ICAs with mild to moderate multifocal narrowing (no more than 50%). ICA termini well perfused. A1 segments widely patent bilaterally. Normal anterior communicating artery. Anterior cerebral arteries patent to their distal aspects without stenosis. M1 segments demonstrate atheromatous irregularity but are patent bilaterally without flow-limiting stenosis. Normal MCA bifurcations. No proximal M2 occlusion. Focal atherosclerotic plaque with associated severe left M2 stenosis noted (series 9, image 151). Distal MCA branches well perfused and symmetric. Posterior circulation: Dominant left vertebral artery patent as it crosses into the cranial vault. Atheromatous plaque within the mid-distal left V4 segment with associated moderate approximate 50% stenosis. Left PICA patent. Hypoplastic right vertebral artery largely terminates in PICA, although a tiny branch ascending towards the vertebrobasilar junction. Right PICA patent as well. Basilar demonstrates multifocal atheromatous irregularity short-segment mild stenosis noted within the mid basilar. Superior cerebral arteries patent proximally. Left PCA supplied via the basilar. Predominant fetal type origin of the right PCA. PCAs demonstrate scattered atheromatous irregularity but are patent to their distal aspects without flow-limiting stenosis. Venous sinuses: Grossly patent allowing for timing of the contrast bolus. Anatomic variants: Fetal type origin of the right PCA. No intracranial aneurysm or other vascular abnormality. Delayed phase: Not performed. Review of the MIP images confirms the above findings IMPRESSION: CT HEAD IMPRESSION: 1. Question subtle evolving hypodensity involving the supra ganglionic mid left frontal lobe, suspicious for acute left MCA territory infarct. No intracranial hemorrhage. 2. Aspects equals 9. 3. Underlying chronic anterior left frontal lobe infarct. 4.  Age-related cerebral atrophy with mild chronic small vessel ischemic disease. CTA HEAD AND NECK IMPRESSION: 1. Negative CTA for large vessel occlusion. 2. 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 3. Approximate 50% stenosis about the proximal right ICA. 4. 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 Distal reconstitution at the distal right V2 segment. Dominant left vertebral artery widely patent within the neck. 5. 50% atheromatous stenosis involving the  mid-distal left V4 segment. 6. Additional diffuse small vessel atheromatous irregularity throughout the intracranial circulation. 7. Emphysema. 8. 6 mm left upper lobe nodule, indeterminate. Non-contrast chest CT at 6-12 months is recommended. If the nodule is stable at time of repeat CT, then future CT at 18-24 months (from today's scan) is considered optional for low-risk patients, but is recommended for high-risk patients. This recommendation follows the consensus statement: Guidelines for Management of Incidental Pulmonary Nodules Detected on CT Images: From the Fleischner Society 2017; Radiology 2017; 284:228-243. Electronically Signed   By: Jeannine Boga M.D.   On: 05/31/2019 21:24   Ct Angio Neck W Or Wo Contrast  Result Date: 05/31/2019 CLINICAL DATA:  Initial evaluation for possible stroke, right-sided deficits. EXAM: CT ANGIOGRAPHY HEAD AND NECK TECHNIQUE: Multidetector CT imaging of the head and neck was performed using the standard protocol during bolus administration of intravenous contrast. Multiplanar CT image reconstructions and MIPs were obtained to evaluate the vascular anatomy. Carotid stenosis measurements (when applicable) are obtained utilizing NASCET criteria, using the distal internal carotid diameter as the denominator. CONTRAST:  75mL OMNIPAQUE IOHEXOL 350 MG/ML SOLN COMPARISON:  None available. FINDINGS: CT HEAD FINDINGS Brain: Generalized age-related cerebral atrophy with mild chronic small  vessel ischemic disease. 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 There is question of subtle evolving hypodensity involving the supra ganglionic cortical gray matter, M5 distribution (series 6, image 43). No other definite acute large vessel territory infarct. No acute intracranial hemorrhage. No mass lesion or midline shift. No hydrocephalus. No extra-axial fluid collection. Vascular: No hyperdense vessel. Calcified atherosclerosis present at skull base. Skull: Scalp soft tissues and calvarium within normal limits. Sinuses/Orbits: Globes and orbital soft tissues within normal limits. Chronic right sphenoid sinus disease. Mucosal thickening noted within the inferior left maxillary sinus. Paranasal sinuses are otherwise clear. Small right mastoid effusion noted. Other: None. ASPECTS [facility] Stroke Program Early CT Score) - Ganglionic level infarction (caudate, lentiform nuclei, internal capsule, insula, M1-M3 cortex): 7 - Supraganglionic infarction (M4-M6 cortex): 2 Total score (0-10 with 10 being normal): 9 Review of the MIP images confirms the above findings CTA NECK FINDINGS Aortic arch: Visualized arch of normal caliber with normal 3 vessel morphology. Mild atheromatous change about the origin of the great vessels without hemodynamically significant stenosis. Visualized subclavian arteries widely patent. Right carotid system: Right common carotid artery patent from its origin to the bifurcation without stenosis. Scattered mixed plaque about the proximal right ICA with associated stenosis of up to 50% by NASCET criteria. Right ICA otherwise widely patent to the skull base. Left carotid system: Left common carotid artery patent from its origin to the bifurcation without flow-limiting stenosis. Eccentric calcified plaque at the origin of the left ICA with associated stenosis of approximately 65-70% by NASCET criteria. Left ICA otherwise  widely patent to the skull base. Vertebral arteries: Both of the vertebral arteries arise from the subclavian arteries. Dominant left vertebral artery widely patent within the neck. 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 Distal reconstitution via muscular branches at the distal right V2 segment. Right vertebral patent distally to the skull base without stenosis or other abnormality. Skeleton: No acute osseous finding. No discrete lytic or blastic osseous lesions. 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 Other neck: No other acute soft tissue abnormality within the neck. Upper chest: Upper lobe predominant emphysema. Superimposed 6 mm left upper lobe nodule (series 5, image 176). Visualized upper chest demonstrates  no other acute abnormality. Review of the MIP images confirms the above findings CTA HEAD FINDINGS Anterior circulation: Focal atheromatous plaque within the petrous left ICA with mild stenosis. Petrous right ICA widely patent. Scattered atheromatous plaque within the cavernous/supraclinoid ICAs with mild to moderate multifocal narrowing (no more than 50%). ICA termini well perfused. A1 segments widely patent bilaterally. Normal anterior communicating artery. Anterior cerebral arteries patent to their distal aspects without stenosis. M1 segments demonstrate atheromatous irregularity but are patent bilaterally without flow-limiting stenosis. Normal MCA bifurcations. No proximal M2 occlusion. Focal atherosclerotic plaque with associated severe left M2 stenosis noted (series 9, image 151). Distal MCA branches well perfused and symmetric. Posterior circulation: Dominant left vertebral artery patent as it crosses into the cranial vault. Atheromatous plaque within the mid-distal left V4 segment with associated moderate approximate 50% stenosis. Left PICA patent. Hypoplastic right vertebral artery largely terminates in PICA, although a tiny branch ascending  towards the vertebrobasilar junction. Right PICA patent as well. Basilar demonstrates multifocal atheromatous irregularity short-segment mild stenosis noted within the mid basilar. Superior cerebral arteries patent proximally. Left PCA supplied via the basilar. Predominant fetal type origin of the right PCA. PCAs demonstrate scattered atheromatous irregularity but are patent to their distal aspects without flow-limiting stenosis. Venous sinuses: Grossly patent allowing for timing of the contrast bolus. Anatomic variants: Fetal type origin of the right PCA. No intracranial aneurysm or other vascular abnormality. Delayed phase: Not performed. Review of the MIP images confirms the above findings IMPRESSION: CT HEAD IMPRESSION: 1. Question subtle evolving hypodensity involving the supra ganglionic mid left frontal lobe, suspicious for acute left MCA territory infarct. No intracranial hemorrhage. 2. Aspects equals 9. 3. Underlying chronic anterior left frontal lobe infarct. 4. Age-related cerebral atrophy with mild chronic small vessel ischemic disease. CTA HEAD AND NECK IMPRESSION: 1. Negative CTA for large vessel occlusion. 2. 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 3. Approximate 50% stenosis about the proximal right ICA. 4. 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 Distal reconstitution at the distal right V2 segment. Dominant left vertebral artery widely patent within the neck. 5. 50% atheromatous stenosis involving the mid-distal left V4 segment. 6. Additional diffuse small vessel atheromatous irregularity throughout the intracranial circulation. 7. Emphysema. 8. 6 mm left upper lobe nodule, indeterminate. Non-contrast chest CT at 6-12 months is recommended. If the nodule is stable at time of repeat CT, then future CT at 18-24 months (from today's scan) is considered optional for low-risk patients, but is recommended for high-risk patients. This recommendation follows the  consensus statement: Guidelines for Management of Incidental Pulmonary Nodules Detected on CT Images: From the Fleischner Society 2017; Radiology 2017; 284:228-243. Electronically Signed   By: Jeannine Boga M.D.   On: 05/31/2019 21:24   Ct Head Code Stroke Wo Contrast  Result Date: 05/31/2019 CLINICAL DATA:  Initial evaluation for possible stroke, right-sided deficits. EXAM: CT ANGIOGRAPHY HEAD AND NECK TECHNIQUE: Multidetector CT imaging of the head and neck was performed using the standard protocol during bolus administration of intravenous contrast. Multiplanar CT image reconstructions and MIPs were obtained to evaluate the vascular anatomy. Carotid stenosis measurements (when applicable) are obtained utilizing NASCET criteria, using the distal internal carotid diameter as the denominator. CONTRAST:  31mL OMNIPAQUE IOHEXOL 350 MG/ML SOLN COMPARISON:  None available. FINDINGS: CT HEAD FINDINGS Brain: Generalized age-related cerebral atrophy with mild chronic small vessel ischemic disease. 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 There is question  of subtle evolving hypodensity involving the supra ganglionic cortical gray matter, M5 distribution (series 6, image 43). No other definite acute large vessel territory infarct. No acute intracranial hemorrhage. No mass lesion or midline shift. No hydrocephalus. No extra-axial fluid collection. Vascular: No hyperdense vessel. Calcified atherosclerosis present at skull base. Skull: Scalp soft tissues and calvarium within normal limits. Sinuses/Orbits: Globes and orbital soft tissues within normal limits. Chronic right sphenoid sinus disease. Mucosal thickening noted within the inferior left maxillary sinus. Paranasal sinuses are otherwise clear. Small right mastoid effusion noted. Other: None. ASPECTS [facility] Stroke Program Early CT Score) - Ganglionic level infarction (caudate,  lentiform nuclei, internal capsule, insula, M1-M3 cortex): 7 - Supraganglionic infarction (M4-M6 cortex): 2 Total score (0-10 with 10 being normal): 9 Review of the MIP images confirms the above findings CTA NECK FINDINGS Aortic arch: Visualized arch of normal caliber with normal 3 vessel morphology. Mild atheromatous change about the origin of the great vessels without hemodynamically significant stenosis. Visualized subclavian arteries widely patent. Right carotid system: Right common carotid artery patent from its origin to the bifurcation without stenosis. Scattered mixed plaque about the proximal right ICA with associated stenosis of up to 50% by NASCET criteria. Right ICA otherwise widely patent to the skull base. Left carotid system: Left common carotid artery patent from its origin to the bifurcation without flow-limiting stenosis. Eccentric calcified plaque at the origin of the left ICA with associated stenosis of approximately 65-70% by NASCET criteria. Left ICA otherwise widely patent to the skull base. Vertebral arteries: Both of the vertebral arteries arise from the subclavian arteries. Dominant left vertebral artery widely patent within the neck. 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 Distal reconstitution via muscular branches at the distal right V2 segment. Right vertebral patent distally to the skull base without stenosis or other abnormality. Skeleton: No acute osseous finding. No discrete lytic or blastic osseous lesions. 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 Other neck: No other acute soft tissue abnormality within the neck. Upper chest: Upper lobe predominant emphysema. Superimposed 6 mm left upper lobe nodule (series 5, image 176). Visualized upper chest demonstrates no other acute abnormality. Review of the MIP images confirms the above findings CTA HEAD FINDINGS Anterior circulation: Focal atheromatous plaque within the petrous left  ICA with mild stenosis. Petrous right ICA widely patent. Scattered atheromatous plaque within the cavernous/supraclinoid ICAs with mild to moderate multifocal narrowing (no more than 50%). ICA termini well perfused. A1 segments widely patent bilaterally. Normal anterior communicating artery. Anterior cerebral arteries patent to their distal aspects without stenosis. M1 segments demonstrate atheromatous irregularity but are patent bilaterally without flow-limiting stenosis. Normal MCA bifurcations. No proximal M2 occlusion. Focal atherosclerotic plaque with associated severe left M2 stenosis noted (series 9, image 151). Distal MCA branches well perfused and symmetric. Posterior circulation: Dominant left vertebral artery patent as it crosses into the cranial vault. Atheromatous plaque within the mid-distal left V4 segment with associated moderate approximate 50% stenosis. Left PICA patent. Hypoplastic right vertebral artery largely terminates in PICA, although a tiny branch ascending towards the vertebrobasilar junction. Right PICA patent as well. Basilar demonstrates multifocal atheromatous irregularity short-segment mild stenosis noted within the mid basilar. Superior cerebral arteries patent proximally. Left PCA supplied via the basilar. Predominant fetal type origin of the right PCA. PCAs demonstrate scattered atheromatous irregularity but are patent to their distal aspects without flow-limiting stenosis. Venous sinuses: Grossly patent allowing for timing of the contrast bolus. Anatomic variants: Fetal type origin of  the right PCA. No intracranial aneurysm or other vascular abnormality. Delayed phase: Not performed. Review of the MIP images confirms the above findings IMPRESSION: CT HEAD IMPRESSION: 1. Question subtle evolving hypodensity involving the supra ganglionic mid left frontal lobe, suspicious for acute left MCA territory infarct. No intracranial hemorrhage. 2. Aspects equals 9. 3. Underlying chronic  anterior left frontal lobe infarct. 4. Age-related cerebral atrophy with mild chronic small vessel ischemic disease. CTA HEAD AND NECK IMPRESSION: 1. Negative CTA for large vessel occlusion. 2. 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 3. Approximate 50% stenosis about the proximal right ICA. 4. 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 Distal reconstitution at the distal right V2 segment. Dominant left vertebral artery widely patent within the neck. 5. 50% atheromatous stenosis involving the mid-distal left V4 segment. 6. Additional diffuse small vessel atheromatous irregularity throughout the intracranial circulation. 7. Emphysema. 8. 6 mm left upper lobe nodule, indeterminate. Non-contrast chest CT at 6-12 months is recommended. If the nodule is stable at time of repeat CT, then future CT at 18-24 months (from today's scan) is considered optional for low-risk patients, but is recommended for high-risk patients. This recommendation follows the consensus statement: Guidelines for Management of Incidental Pulmonary Nodules Detected on CT Images: From the Fleischner Society 2017; Radiology 2017; 284:228-243. Electronically Signed   By: Jeannine Boga M.D.   On: 05/31/2019 21:24    Pending Labs Unresulted Labs (From admission, onward)    Start     Ordered   06/01/19 0500  Hemoglobin A1c  Tomorrow morning,   R     05/31/19 2229   06/01/19 0500  Lipid panel  Tomorrow morning,   R    Comments: Fasting    05/31/19 2229   06/01/19 0500  TSH  Tomorrow morning,   R     05/31/19 2229   06/01/19 0500  CBC  Tomorrow morning,   R     05/31/19 2229   06/01/19 8453  Basic metabolic panel  Tomorrow morning,   R     05/31/19 2229   05/31/19 2009  Urine rapid drug screen (hosp performed)  ONCE - STAT,   STAT     05/31/19 2008   05/31/19 2009  Urinalysis, Routine w reflex microscopic  ONCE - STAT,   STAT     05/31/19 2008   Signed and Held  SARS Coronavirus 2 (Performed in  [facility] hospital lab)  (For testing to be completed at [facility] campuses)  Once,   R    Question:  Pre-procedural testing  Answer:  Yes   Signed and Held          Vitals/Pain Today's Vitals   05/31/19 2045 05/31/19 2100 05/31/19 2115 05/31/19 2200  BP: (!) 150/69 (!) 153/73 (!) 144/69 (!) 163/66  Pulse: 69 68 68 69  Resp: 11 11 11 12   Temp:      TempSrc:      SpO2: 95% 96% 95% 95%  Weight:      Height:        Isolation Precautions No active isolations  Medications Medications   stroke: mapping our early stages of recovery book (has no administration in time range)  0.9 %  sodium chloride infusion (has no administration in time range)  acetaminophen (TYLENOL) tablet 650 mg (has no administration in time range)    Or  acetaminophen (TYLENOL) solution 650 mg (has no administration in time range)    Or  acetaminophen (TYLENOL)  suppository 650 mg (has no administration in time range)  enoxaparin (LOVENOX) injection 40 mg (has no administration in time range)  insulin aspart (novoLOG) injection 0-9 Units (has no administration in time range)  pantoprazole (PROTONIX) injection 40 mg (has no administration in time range)  iohexol (OMNIPAQUE) 350 MG/ML injection 75 mL (75 mLs Intravenous Contrast Given 05/31/19 2021)    Mobility non-ambulatory High fall risk   Focused Assessments Neuro Assessment Handoff:  Swallow screen pass? No  Cardiac Rhythm: Normal sinus rhythm NIH Stroke Scale ( + Modified Stroke Scale Criteria)  Interval: Initial Level of Consciousness (1a.)   : Not alert, but arousable by minor stimulation to obey, answer, or respond LOC Questions (1b. )   +: Answers neither question correctly LOC Commands (1c. )   + : Performs both tasks correctly Best Gaze (2. )  +: Normal Visual (3. )  +: Complete hemianopia Facial Palsy (4. )    : Minor paralysis Motor Arm, Left (5a. )   +: No drift Motor Arm, Right (5b. )   +: No movement Motor Leg, Left (6a. )   +:  No drift Motor Leg, Right (6b. )   +: No effort against gravity Limb Ataxia (7. ): Absent Sensory (8. )   +: Severe to total sensory loss, patient is not aware of being touched in the face, arm, and leg Best Language (9. )   +: Severe aphasia Dysarthria (10. ): Severe dysarthria, patient's speech is so slurred as to be unintelligible in the absence of or out of proportion to any dysphasia, or is mute/anarthric Extinction/Inattention (11.)   +: No Abnormality Modified SS Total  +: 15 Complete NIHSS TOTAL: 19 Last date known well: 05/31/19 Last time known well: 1915 Neuro Assessment: Exceptions to WDL Neuro Checks:   Initial (05/31/19 2010)  Last Documented NIHSS Modified Score: 15 (05/31/19 2015) Has TPA been given? No If patient is a Neuro Trauma and patient is going to OR before floor call report to [facility] nurse: [phone] or [phone]     R Recommendations: See Admitting Provider Note  Report given to:   Additional Notes:

## 2019-05-31 NOTE — Consult Note (Signed)
Neurology Consultation Reason for Consult: Right-sided weakness Referring Physician: Tyron Russell  CC: Right-sided weakness  History is obtained from: Wife  HPI: Luke Rivera is a 78 y.o. male with a history of atrial fibrillation on anticoagulation with Eliquis who is compliant with his medication per his wife who was in his normal state of health around 7 when he went into the next room to talk on the phone.  He continued talking normally until approximately 715 which point his speech changed to a garbled mishmash.  She then noticed that he was having trouble with his right side and therefore called 911.  He was brought into the emergency department as a code stroke where CT excluded intracranial hemorrhage and a CTA was performed to assess for candidacy for intra-arterial intervention, but unfortunately there was no target.  At baseline, he is able to walk with the help of a walking stick, but does not get around all that much.  This is due to previous back surgeries.  Mentally, he has had some difficulties with his short-term memory but still takes care of his own bills and his own activities of daily living.  LKW: 7:15 PM tpa given?: no, anticoagulated with Eliquis   ROS:  Unable to obtain due to altered mental status.   Past medical history: Unable to obtain due to altered mental status.   Family history: Unable to obtain due to altered mental status.   Social History:  reports that he has quit smoking. He has never used smokeless tobacco. No history on file for alcohol and drug.   Exam: Current vital signs: BP (!) 150/69   Pulse 69   Temp 97.8 F (36.6 C) (Oral)   Resp 11   Ht 6' (1.829 m)   Wt 125.2 kg   SpO2 95%   BMI 37.43 kg/m  Vital signs in last 24 hours: Temp:  [97.8 F (36.6 C)] 97.8 F (36.6 C) (06/22 2008) Pulse Rate:  [69-85] 69 (06/22 2045) Resp:  [11] 11 (06/22 2045) BP: (150-169)/(69-89) 150/69 (06/22 2045) SpO2:  [93 %-97 %] 95 % (06/22  2045) Weight:  [125.2 kg] 125.2 kg (06/22 2008)   Physical Exam  Constitutional: Appears well-developed and well-nourished.  Psych: Affect appropriate to situation Eyes: No scleral injection HENT: No OP obstrucion Head: Normocephalic.  Cardiovascular: Normal rate and regular rhythm.  Respiratory: Effort normal, non-labored breathing GI: Soft.  No distension. There is no tenderness.  Skin: WDI  Neuro: Mental Status: Patient is awake, alert, he is able to follow some simple commands, but speech is non-intelligible Cranial Nerves: II: Does not blink to threat from the right.  Pupils are equal, round, and reactive to light.   III,IV, VI: Left gaze preference but does cross midline to the right.  V,VII: Facial movement with left weakness VIII: hearing is intact to voice X: Uvula elevates symmetrically XI: Shoulder shrug is symmetric. XII: tongue is midline without atrophy or fasciculations.  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  I suspect that he likely had occlusion of the MCA with  subsequent breaking up and distal embolization.  He is unfortunately not a candidate for IV TPA and his symptoms are currently fairly devastating.  I would hold anticoagulation pending MRI.  Recommendations: - HgbA1c, fasting lipid panel - MRI of the brain without contrast - Frequent neuro checks - Echocardiogram - Prophylactic therapy-Antiplatelet med: Aspirin - dose 325mg  PO or 300mg  PR - Risk factor modification - Telemetry monitoring - PT consult, OT  consult, Speech consult - Stroke team to follow    Roland Rack, MD [facility] [phone]  If 7pm- 7am, please page neurology on call as listed in [facility].

## 2019-05-31 NOTE — ED Provider Notes (Signed)
[facility] EMERGENCY DEPARTMENT Provider Note   CSN: [id number] Arrival date & time: 05/31/19  2005   LEVEL 5 CAVEAT - APHASIA/CODE STROKE  History   Chief Complaint Chief Complaint  Patient presents with  . Code Stroke    HPI Luke Rivera is a 78 y.o. male.     HPI  79 year old male presents as a code stroke.  Around 7:15 PM he took a phone call and went to another room and the wife heard garbled speech.  EMS was called.  He is on Eliquis and Plavix.  The patient is unable to provide much history.  Had right-sided facial droop and right arm weakness for EMS.  No past medical history on file.  Patient Active Problem List   Diagnosis Date Noted  . 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 Onychomycosis 01/11/2015  . Pain in lower limb 01/11/2015  . Hammer toe of right foot 01/02/2015  . Pain in right foot 01/02/2015          Home Medications    Prior to Admission medications   Medication Sig Start Date End Date Taking? Authorizing Provider  atorvastatin (LIPITOR) 80 MG tablet Take 80 mg by mouth daily. Half tab six days a week    [provider]  Cholecalciferol (VITAMIN D3) 3000 UNITS TABS Take by mouth daily.    [provider]  clopidogrel (PLAVIX) 75 MG tablet Take 75 mg by mouth daily.    [provider]  furosemide (LASIX) 40 MG tablet Take 40 mg by mouth as needed.    [provider]  gabapentin (NEURONTIN) 300 MG capsule Take 300 mg by mouth 3 (three) times daily.    [provider]  glipiZIDE (GLUCOTROL) 5 MG tablet Take 5 mg by mouth daily before breakfast.    [provider]  losartan (COZAAR) 25 MG tablet Take 25 mg by mouth. Half 3 times a week    [provider]  metFORMIN (GLUCOPHAGE) 1000 MG tablet Take 1,000 mg by mouth daily with breakfast.    [provider]  metoprolol succinate (TOPROL-XL) 25 MG 24 hr tablet Take 25 mg by mouth. Half twice  daily    [provider]  nitroGLYCERIN (NITROSTAT) 0.6 MG SL tablet Place 0.6 mg under the tongue as needed for chest pain.    [provider]  oxycodone (OXY-IR) 5 MG capsule Take 5 mg by mouth 3 (three) times daily.    [provider]  pantoprazole (PROTONIX) 40 MG tablet Take 40 mg by mouth daily.    [provider]    Family History No family history on file.  Social History Social History   Tobacco Use  . Smoking status: Former Research scientist (life sciences)  . Smokeless tobacco: Never Used  Substance Use Topics  . Alcohol use: Not on file  . Drug use: Not on file     Allergies   Patient has no known allergies.   Review of Systems Review of Systems  Unable to perform ROS: Other     Physical Exam Updated Vital Signs BP (!) 150/69   Pulse 69   Temp 97.8 F (36.6 C) (Oral)   Resp 11   Ht 6' (1.829 m)   Wt 125.2 kg   SpO2 95%   BMI 37.43 kg/m   Physical Exam Vitals signs and nursing note reviewed.  Constitutional:      Appearance: He is well-developed. He is obese.  HENT:  Head: Normocephalic and atraumatic.     Right Ear: External ear normal.     Left Ear: External ear normal.     Nose: Nose normal.  Eyes:     General:        Right eye: No discharge.        Left eye: No discharge.  Neck:     Musculoskeletal: Neck supple.  Cardiovascular:     Rate and Rhythm: Normal rate and regular rhythm.     Heart sounds: Normal heart sounds.  Pulmonary:     Effort: Pulmonary effort is normal.     Breath sounds: Normal breath sounds.  Abdominal:     Palpations: Abdomen is soft.     Tenderness: There is no abdominal tenderness.  Skin:    General: Skin is warm and dry.  Neurological:     Mental Status: He is alert.     Comments: Awake, alert, incomprehensible speech. Does not smile on command. RUE is flaccid. RLE 3/5. LUE, LLE 5/5  Psychiatric:        Mood and Affect: Mood is not anxious.      ED Treatments / Results  Labs (all labs  ordered are listed, but only abnormal results are displayed) Labs Reviewed  CBC - Abnormal; Notable for the following components:      Result Value   Hemoglobin 12.9 (*)    RDW 15.6 (*)    All other components within normal limits  COMPREHENSIVE METABOLIC PANEL - Abnormal; Notable for the following components:   Glucose, Bld 202 (*)    Total Protein 6.4 (*)    All other components within normal limits  I-STAT CHEM 8, ED - Abnormal; Notable for the following components:   Glucose, Bld 199 (*)    Calcium, Ion 1.01 (*)    Hemoglobin 12.6 (*)    HCT 37.0 (*)    All other components within normal limits  CBG MONITORING, ED - Abnormal; Notable for the following components:   Glucose-Capillary 169 (*)    All other components within normal limits  SARS CORONAVIRUS 2 (HOSPITAL ORDER, [facility])  ETHANOL  PROTIME-INR  APTT  DIFFERENTIAL  RAPID URINE DRUG SCREEN, HOSP PERFORMED  URINALYSIS, ROUTINE W REFLEX MICROSCOPIC    EKG EKG Interpretation  Date/Time:  Monday May 31 2019 20:47:13 EDT Ventricular Rate:  67 PR Interval:    QRS Duration: 141 QT Interval:  441 QTC Calculation: 466 R Axis:   -31 Text Interpretation:  Sinus rhythm Ventricular premature complex Prolonged PR interval IVCD, consider atypical RBBB No old tracing to compare Confirmed by Sherwood Gambler [phone]) on 05/31/2019 8:49:41 PM   Radiology No results found.  Procedures Procedures (including critical care time)  Medications Ordered in ED Medications  iohexol (OMNIPAQUE) 350 MG/ML injection 75 mL (75 mLs Intravenous Contrast Given 05/31/19 2021)     Initial Impression / Assessment and Plan / ED Course  I have reviewed the triage vital signs and the nursing notes.  Pertinent labs & imaging results that were available during my care of the patient were reviewed by me and considered in my medical decision making (see chart for details).        Patient is not a candidate for  TPA based on the blood thinners.  No obvious large vessel occlusion.  He will need admission to the hospitalist service for further work-up and care of an acute stroke.  Final Clinical Impressions(s) / ED Diagnoses   Final diagnoses:  Ischemic stroke [facility])    ED Discharge Orders    None       Sherwood Gambler, MD 05/31/19 2127

## 2019-05-31 NOTE — ED Triage Notes (Signed)
Pt presents to ED from home by [facility]. Per EMS family reports at 1915 today pt began gargled speech, weakness. Upon EMS arrival R facial droop and R arm drift. EMS VSS.

## 2019-05-31 NOTE — H&P (Addendum)
[facility] Admission History and Physical Service Pager: [phone]  Patient name: Luke Rivera Medical record number: [id number] Date of birth: [birth year] Age: 78 y.o. Gender: male  Primary Care Provider: Kristopher Glee., MD Consultants: Neurology Code Status: DNR Emergency Contact: Ron Parker [phone]  Chief Complaint: Gargled speech, right sided weakness, questionable right sided facial droop  Assessment and Plan: Luke Rivera is a 78 y.o. male presenting with gargled speech and right sided deficits. 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  Gargled Speech, Right sided Deficits: Presents after developing garbled speech, right sided facial droop and arm weakness at around 7:15pm on 6/22 which has not returned to baseline. Patient unable to provide history during exam. CT head negative for acute hemorrhagebut notable for subtle evolving hypodensity involving supra ganglionic mid left frontal lobe suspicious for acute left MCA infarct with a chronic anterior left frontal lobe infarct and age related cerebral atrophy with mild chronic small vessel ischemic disease. 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 Exam is somewhat limited as patient only able to follow some commands. PE notable for significant RUE weakness, moderate RLE weakness, and tongue deviation to the right. Unable to assess facial droop, however EOMI and PERRL. Speech was mumbled and incomprehensible. Per wife, patient is able to walk at baseline and carry out IADLs, however does appear to have some memory impairment since his TIA in 2018. Neuro was consulted in ED and although patient was in the window for TPA, did not recommend TPA given  he is on Eliquis and Plavix. Patient was admitted for further stroke work up. Per chart review, Echo in 2018 notable for EF of 55-60% with LV impaired relaxation. EKG on admission NSR with some prolonged PR interval and PVC which appear to be chronic for patient per chart review.   -Admit to inpatient, medical telemetry, attending Dr. Nori Riis -Neurology consult, appreciate recommendations -MRI brain -Echo -Carotid Doppler -Permissive hypertension -Hemoglobin A1c, lipid panel, TSH -heart healthy/carb modified diet after passes swallow screen -NS 150 mL/h for 12 hours -PT/OT/SLP assessment -Bed rest until PT/OT recs -AM CBC, BMP, EKG  - Aspirin 325mg  PO or 300mg  PR -depending on Neuro recs, may consider using a different platelet inhibitor such as Brillinta in addition to ASA since patient has had two neurovascular events while on Plavix  HTN:  BP 150/69 on admission. Home meds: Lasix 40mg  QD, Lopressor 12.5mg  2x/week , losartan 12.5mg  3x/week, Potassium gluconate 99mg  QD - permissive HTN  - hold home meds  T2DM with periopheral neuropathy:  Last A1C 6.9 (2018). Home meds: Glipizide 10mg  BID, Lantus 40U qHS, Metformin 1000mg  qHS, Gabapentin 600mg  BID  - NPO until passes swallow study - sSSI - CBGs q4 hours until passes swallow study, then with meals - hold all home meds  HLD: Home meds: Atorvastatin 40mg  QD - hold home meds until swallow study  H/o CAD with two DES (2013): History of two DES in 2nd marginal and mid RCA in 03/2012. 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  FEN/GI: NPO pending swallow study, IV protonix Prophylaxis:  Lovenox  Disposition: med-surg telemetry for further stroke workup  History of Present Illness:  Luke Rivera is a 78 y.o. male presenting with garbled speech and right sided weakness starting this evening at around 7:15pm.  Unable to obtain history from patient given current state. History provided by wife. She notes he was in normal state of health at dinner. He received a phone call and got up to take the call. She went in a few minutes later and noticed he was unable to talk to her. He tried to get up but his right arm was very weak and so she called 911. He continued to mumble to her but unable to understand anything.  Per wife, he has a history of a TIA 2 years ago when visiting [place] that consisted of some right sided arm weakness. Seen in ED and had multiple imaging but was discharged that day given that he had returned to baseline. However she does note that his handwriting (right hand dominant) became a little more sloppy since that time. She also notices a small speech change when he pronounces his "s's" which now sound more like a "hiss" which she has noticed since his TIA in 2018 as well. 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 She also notes that he endorses some numbness in his right foot as well which may be secondary to neuropathy or TIA. Per wife, he is very independent including paying his own bills. Per wife he also has history of DVT in left leg which is why he is on two blood thinners. She also notes he has a history of urinary incontinence.   Review Of Systems: Per HPI with the following additions:   Review of Systems  Unable to perform ROS: Other  Level 5 caveat due to aphasia  Patient Active Problem List   Diagnosis Date Noted  . CVA (cerebral vascular accident) ([place]) 05/31/2019  . 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 Onychomycosis 01/11/2015  . Pain in lower limb 01/11/2015  . Hammer toe of  right foot 01/02/2015  . Pain in right foot 01/02/2015    Past Medical History: No past medical history on file.  Past Surgical History: Unable to obtain history given patient's current state of health  Social History: Social History   Tobacco Use  . Smoking status: Former Research scientist (life sciences)  . Smokeless tobacco: Never Used  Substance Use Topics  . Alcohol use: Not on file  . Drug use: Not on file   Additional social history: Unable to obtain social history Please also refer to relevant sections of EMR.  Family History: No family history on file.  Allergies and Medications: No Known Allergies No current facility-administered medications on file prior to encounter.    Current Outpatient Medications on File Prior to Encounter  Medication Sig Dispense Refill  . atorvastatin (LIPITOR) 80 MG tablet Take 80 mg by mouth daily. Half tab six days a week    . Cholecalciferol (VITAMIN D3) 3000 UNITS TABS Take by mouth daily.    . clopidogrel (PLAVIX) 75 MG tablet Take 75 mg by mouth daily.    . furosemide (LASIX) 40 MG tablet Take 40 mg by mouth as needed.    . gabapentin (NEURONTIN) 300 MG capsule  Take 300 mg by mouth 3 (three) times daily.    Marland Kitchen glipiZIDE (GLUCOTROL) 5 MG tablet Take 5 mg by mouth daily before breakfast.    . losartan (COZAAR) 25 MG tablet Take 25 mg by mouth. Half 3 times a week    . metFORMIN (GLUCOPHAGE) 1000 MG tablet Take 1,000 mg by mouth daily with breakfast.    . metoprolol succinate (TOPROL-XL) 25 MG 24 hr tablet Take 25 mg by mouth. Half twice daily    . nitroGLYCERIN (NITROSTAT) 0.6 MG SL tablet Place 0.6 mg under the tongue as needed for chest pain.    Marland Kitchen oxycodone (OXY-IR) 5 MG capsule Take 5 mg by mouth 3 (three) times daily.    . pantoprazole (PROTONIX) 40 MG tablet Take 40 mg by mouth daily.      Objective: BP (!) 144/69   Pulse 68   Temp 97.8 F (36.6 C) (Oral)   Resp 11   Ht 6' (1.829 m)   Wt 125.2 kg   SpO2 95%   BMI 37.43 kg/m  Physical  Exam Constitutional:      General: He is not in acute distress.    Appearance: He is obese. He is not diaphoretic.  HENT:     Head: Normocephalic and atraumatic.     Nose: Nose normal. No rhinorrhea.     Mouth/Throat:     Mouth: Mucous membranes are moist.  Eyes:     Extraocular Movements: Extraocular movements intact.     Conjunctiva/sclera: Conjunctivae normal.     Pupils: Pupils are equal, round, and reactive to light.  Neck:     Musculoskeletal: Normal range of motion.  Cardiovascular:     Rate and Rhythm: Normal rate and regular rhythm.     Pulses: Normal pulses.     Heart sounds: No murmur.  Pulmonary:     Effort: Pulmonary effort is normal. No respiratory distress.     Breath sounds: Normal breath sounds.  Abdominal:     General: Abdomen is flat. Bowel sounds are normal. There is no distension.     Palpations: Abdomen is soft. There is no mass.     Comments: Scar across RLQ  Musculoskeletal:        General: No tenderness.  Skin:    Comments: Dusky areas on bilateral lower extremities  Neurological:     Mental Status: He is confused.     Cranial Nerves: Cranial nerve deficit, dysarthria and facial asymmetry present.     Sensory: Sensation is intact.     Motor: Weakness present. No tremor, atrophy or abnormal muscle tone.     Comments: 0/5 grip strength of R hand, 2/5 strength of RUE, 3/5 strength of RLE, 5/5 strength of LUE and LLE, tongue deviates to the right.  Will follow some commands but not all.  Unable to assess sensation due to aphasia and confusion. Unable to assess facial droop as he was unable to follow commands.    Labs and Imaging: CBC BMET  Recent Labs  Lab 05/31/19 2009 05/31/19 2012  WBC 5.7  --   HGB 12.9* 12.6*  HCT 39.1 37.0*  PLT 269  --    Recent Labs  Lab 05/31/19 2009 05/31/19 2012  NA 140 140  K 3.7 3.6  CL 106 104  CO2 22  --   BUN 11 11  CREATININE 1.08 0.90  GLUCOSE 202* 199*  CALCIUM 8.9  --      CBG 169 Ethanol  <10 PT/INR: 14.2/1.1  Ct Angio Head W Or Wo Contrast  Result Date: 05/31/2019 CLINICAL DATA:  Initial evaluation for possible stroke, right-sided deficits. EXAM: CT ANGIOGRAPHY HEAD AND NECK TECHNIQUE: Multidetector CT imaging of the head and neck was performed using the standard protocol during bolus administration of intravenous contrast. Multiplanar CT image reconstructions and MIPs were obtained to evaluate the vascular anatomy. Carotid stenosis measurements (when applicable) are obtained utilizing NASCET criteria, using the distal internal carotid diameter as the denominator. CONTRAST:  65mL OMNIPAQUE IOHEXOL 350 MG/ML SOLN COMPARISON:  None available. FINDINGS: CT HEAD FINDINGS Brain: Generalized age-related cerebral atrophy with mild chronic small vessel ischemic disease. 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 There is question of subtle evolving hypodensity involving the supra ganglionic cortical gray matter, M5 distribution (series 6, image 43). No other definite acute large vessel territory infarct. No acute intracranial hemorrhage. No mass lesion or midline shift. No hydrocephalus. No extra-axial fluid collection. Vascular: No hyperdense vessel. Calcified atherosclerosis present at skull base. Skull: Scalp soft tissues and calvarium within normal limits. Sinuses/Orbits: Globes and orbital soft tissues within normal limits. Chronic right sphenoid sinus disease. Mucosal thickening noted within the inferior left maxillary sinus. Paranasal sinuses are otherwise clear. Small right mastoid effusion noted. Other: None. ASPECTS [facility] Stroke Program Early CT Score) - Ganglionic level infarction (caudate, lentiform nuclei, internal capsule, insula, M1-M3 cortex): 7 - Supraganglionic infarction (M4-M6 cortex): 2 Total score (0-10 with 10 being normal): 9 Review of the MIP images confirms the above findings CTA NECK FINDINGS  Aortic arch: Visualized arch of normal caliber with normal 3 vessel morphology. Mild atheromatous change about the origin of the great vessels without hemodynamically significant stenosis. Visualized subclavian arteries widely patent. Right carotid system: Right common carotid artery patent from its origin to the bifurcation without stenosis. Scattered mixed plaque about the proximal right ICA with associated stenosis of up to 50% by NASCET criteria. Right ICA otherwise widely patent to the skull base. Left carotid system: Left common carotid artery patent from its origin to the bifurcation without flow-limiting stenosis. Eccentric calcified plaque at the origin of the left ICA with associated stenosis of approximately 65-70% by NASCET criteria. Left ICA otherwise widely patent to the skull base. Vertebral arteries: Both of the vertebral arteries arise from the subclavian arteries. Dominant left vertebral artery widely patent within the neck. 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 Distal reconstitution via muscular branches at the distal right V2 segment. Right vertebral patent distally to the skull base without stenosis or other abnormality. Skeleton: No acute osseous finding. No discrete lytic or blastic osseous lesions. 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 Other neck: No other acute soft tissue abnormality within the neck. Upper chest: Upper lobe predominant emphysema. Superimposed 6 mm left upper lobe nodule (series 5, image 176). Visualized upper chest demonstrates no other acute abnormality. Review of the MIP images confirms the above findings CTA HEAD FINDINGS Anterior circulation: Focal atheromatous plaque within the petrous left ICA with mild stenosis. Petrous right ICA widely patent. Scattered atheromatous plaque within the cavernous/supraclinoid ICAs with mild to moderate multifocal narrowing (no more than 50%). ICA termini well perfused. A1  segments widely patent bilaterally. Normal anterior communicating artery. Anterior cerebral arteries patent to their distal aspects without stenosis. M1 segments demonstrate atheromatous irregularity but are patent bilaterally without flow-limiting stenosis. Normal MCA bifurcations. No proximal M2 occlusion. Focal atherosclerotic plaque with associated severe left M2 stenosis noted (series 9,  image 151). Distal MCA branches well perfused and symmetric. Posterior circulation: Dominant left vertebral artery patent as it crosses into the cranial vault. Atheromatous plaque within the mid-distal left V4 segment with associated moderate approximate 50% stenosis. Left PICA patent. Hypoplastic right vertebral artery largely terminates in PICA, although a tiny branch ascending towards the vertebrobasilar junction. Right PICA patent as well. Basilar demonstrates multifocal atheromatous irregularity short-segment mild stenosis noted within the mid basilar. Superior cerebral arteries patent proximally. Left PCA supplied via the basilar. Predominant fetal type origin of the right PCA. PCAs demonstrate scattered atheromatous irregularity but are patent to their distal aspects without flow-limiting stenosis. Venous sinuses: Grossly patent allowing for timing of the contrast bolus. Anatomic variants: Fetal type origin of the right PCA. No intracranial aneurysm or other vascular abnormality. Delayed phase: Not performed. Review of the MIP images confirms the above findings IMPRESSION: CT HEAD IMPRESSION: 1. Question subtle evolving hypodensity involving the supra ganglionic mid left frontal lobe, suspicious for acute left MCA territory infarct. No intracranial hemorrhage. 2. Aspects equals 9. 3. Underlying chronic anterior left frontal lobe infarct. 4. Age-related cerebral atrophy with mild chronic small vessel ischemic disease. CTA HEAD AND NECK IMPRESSION: 1. Negative CTA for large vessel occlusion. 2. Approximate 65-70% stenosis  at the origin of the left ICA. 3. Approximate 50% stenosis about the proximal right ICA. 4. 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 Distal reconstitution at the distal right V2 segment. Dominant left vertebral artery widely patent within the neck. 5. 50% atheromatous stenosis involving the mid-distal left V4 segment. 6. Additional diffuse small vessel atheromatous irregularity throughout the intracranial circulation. 7. Emphysema. 8. 6 mm left upper lobe nodule, indeterminate. Non-contrast chest CT at 6-12 months is recommended. If the nodule is stable at time of repeat CT, then future CT at 18-24 months (from today's scan) is considered optional for low-risk patients, but is recommended for high-risk patients. This recommendation follows the consensus statement: Guidelines for Management of Incidental Pulmonary Nodules Detected on CT Images: From the Fleischner Society 2017; Radiology 2017; 284:228-243. Electronically Signed   By: Jeannine Boga M.D.   On: 05/31/2019 21:24   Ct Angio Neck W Or Wo Contrast  Result Date: 05/31/2019 CLINICAL DATA:  Initial evaluation for possible stroke, right-sided deficits. EXAM: CT ANGIOGRAPHY HEAD AND NECK TECHNIQUE: Multidetector CT imaging of the head and neck was performed using the standard protocol during bolus administration of intravenous contrast. Multiplanar CT image reconstructions and MIPs were obtained to evaluate the vascular anatomy. Carotid stenosis measurements (when applicable) are obtained utilizing NASCET criteria, using the distal internal carotid diameter as the denominator. CONTRAST:  26mL OMNIPAQUE IOHEXOL 350 MG/ML SOLN COMPARISON:  None available. FINDINGS: CT HEAD FINDINGS Brain: Generalized age-related cerebral atrophy with mild chronic small vessel ischemic disease. 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 There is question of subtle evolving  hypodensity involving the supra ganglionic cortical gray matter, M5 distribution (series 6, image 43). No other definite acute large vessel territory infarct. No acute intracranial hemorrhage. No mass lesion or midline shift. No hydrocephalus. No extra-axial fluid collection. Vascular: No hyperdense vessel. Calcified atherosclerosis present at skull base. Skull: Scalp soft tissues and calvarium within normal limits. Sinuses/Orbits: Globes and orbital soft tissues within normal limits. Chronic right sphenoid sinus disease. Mucosal thickening noted within the inferior left maxillary sinus. Paranasal sinuses are otherwise clear. Small right mastoid effusion noted. Other: None. ASPECTS [facility] Stroke Program Early CT Score) - Ganglionic  level infarction (caudate, lentiform nuclei, internal capsule, insula, M1-M3 cortex): 7 - Supraganglionic infarction (M4-M6 cortex): 2 Total score (0-10 with 10 being normal): 9 Review of the MIP images confirms the above findings CTA NECK FINDINGS Aortic arch: Visualized arch of normal caliber with normal 3 vessel morphology. Mild atheromatous change about the origin of the great vessels without hemodynamically significant stenosis. Visualized subclavian arteries widely patent. Right carotid system: Right common carotid artery patent from its origin to the bifurcation without stenosis. Scattered mixed plaque about the proximal right ICA with associated stenosis of up to 50% by NASCET criteria. Right ICA otherwise widely patent to the skull base. Left carotid system: Left common carotid artery patent from its origin to the bifurcation without flow-limiting stenosis. Eccentric calcified plaque at the origin of the left ICA with associated stenosis of approximately 65-70% by NASCET criteria. Left ICA otherwise widely patent to the skull base. Vertebral arteries: Both of the vertebral arteries arise from the subclavian arteries. Dominant left vertebral artery widely patent within the neck.  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 Distal reconstitution via muscular branches at the distal right V2 segment. Right vertebral patent distally to the skull base without stenosis or other abnormality. Skeleton: No acute osseous finding. No discrete lytic or blastic osseous lesions. 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 Other neck: No other acute soft tissue abnormality within the neck. Upper chest: Upper lobe predominant emphysema. Superimposed 6 mm left upper lobe nodule (series 5, image 176). Visualized upper chest demonstrates no other acute abnormality. Review of the MIP images confirms the above findings CTA HEAD FINDINGS Anterior circulation: Focal atheromatous plaque within the petrous left ICA with mild stenosis. Petrous right ICA widely patent. Scattered atheromatous plaque within the cavernous/supraclinoid ICAs with mild to moderate multifocal narrowing (no more than 50%). ICA termini well perfused. A1 segments widely patent bilaterally. Normal anterior communicating artery. Anterior cerebral arteries patent to their distal aspects without stenosis. M1 segments demonstrate atheromatous irregularity but are patent bilaterally without flow-limiting stenosis. Normal MCA bifurcations. No proximal M2 occlusion. Focal atherosclerotic plaque with associated severe left M2 stenosis noted (series 9, image 151). Distal MCA branches well perfused and symmetric. Posterior circulation: Dominant left vertebral artery patent as it crosses into the cranial vault. Atheromatous plaque within the mid-distal left V4 segment with associated moderate approximate 50% stenosis. Left PICA patent. Hypoplastic right vertebral artery largely terminates in PICA, although a tiny branch ascending towards the vertebrobasilar junction. Right PICA patent as well. Basilar demonstrates multifocal atheromatous irregularity short-segment mild stenosis noted within the mid basilar.  Superior cerebral arteries patent proximally. Left PCA supplied via the basilar. Predominant fetal type origin of the right PCA. PCAs demonstrate scattered atheromatous irregularity but are patent to their distal aspects without flow-limiting stenosis. Venous sinuses: Grossly patent allowing for timing of the contrast bolus. Anatomic variants: Fetal type origin of the right PCA. No intracranial aneurysm or other vascular abnormality. Delayed phase: Not performed. Review of the MIP images confirms the above findings IMPRESSION: CT HEAD IMPRESSION: 1. Question subtle evolving hypodensity involving the supra ganglionic mid left frontal lobe, suspicious for acute left MCA territory infarct. No intracranial hemorrhage. 2. Aspects equals 9. 3. Underlying chronic anterior left frontal lobe infarct. 4. Age-related cerebral atrophy with mild chronic small vessel ischemic disease. CTA HEAD AND NECK IMPRESSION: 1. Negative CTA for large vessel occlusion. 2. 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 3. Approximate 50% stenosis about the proximal right  ICA. 4. 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 Distal reconstitution at the distal right V2 segment. Dominant left vertebral artery widely patent within the neck. 5. 50% atheromatous stenosis involving the mid-distal left V4 segment. 6. Additional diffuse small vessel atheromatous irregularity throughout the intracranial circulation. 7. Emphysema. 8. 6 mm left upper lobe nodule, indeterminate. Non-contrast chest CT at 6-12 months is recommended. If the nodule is stable at time of repeat CT, then future CT at 18-24 months (from today's scan) is considered optional for low-risk patients, but is recommended for high-risk patients. This recommendation follows the consensus statement: Guidelines for Management of Incidental Pulmonary Nodules Detected on CT Images: From the Fleischner Society 2017; Radiology 2017; 284:228-243. Electronically Signed    By: Jeannine Boga M.D.   On: 05/31/2019 21:24   Ct Head Code Stroke Wo Contrast  Result Date: 05/31/2019 CLINICAL DATA:  Initial evaluation for possible stroke, right-sided deficits. EXAM: CT ANGIOGRAPHY HEAD AND NECK TECHNIQUE: Multidetector CT imaging of the head and neck was performed using the standard protocol during bolus administration of intravenous contrast. Multiplanar CT image reconstructions and MIPs were obtained to evaluate the vascular anatomy. Carotid stenosis measurements (when applicable) are obtained utilizing NASCET criteria, using the distal internal carotid diameter as the denominator. CONTRAST:  73mL OMNIPAQUE IOHEXOL 350 MG/ML SOLN COMPARISON:  None available. FINDINGS: CT HEAD FINDINGS Brain: Generalized age-related cerebral atrophy with mild chronic small vessel ischemic disease. 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 There is question of subtle evolving hypodensity involving the supra ganglionic cortical gray matter, M5 distribution (series 6, image 43). No other definite acute large vessel territory infarct. No acute intracranial hemorrhage. No mass lesion or midline shift. No hydrocephalus. No extra-axial fluid collection. Vascular: No hyperdense vessel. Calcified atherosclerosis present at skull base. Skull: Scalp soft tissues and calvarium within normal limits. Sinuses/Orbits: Globes and orbital soft tissues within normal limits. Chronic right sphenoid sinus disease. Mucosal thickening noted within the inferior left maxillary sinus. Paranasal sinuses are otherwise clear. Small right mastoid effusion noted. Other: None. ASPECTS [facility] Stroke Program Early CT Score) - Ganglionic level infarction (caudate, lentiform nuclei, internal capsule, insula, M1-M3 cortex): 7 - Supraganglionic infarction (M4-M6 cortex): 2 Total score (0-10 with 10 being normal): 9 Review of the MIP images confirms the  above findings CTA NECK FINDINGS Aortic arch: Visualized arch of normal caliber with normal 3 vessel morphology. Mild atheromatous change about the origin of the great vessels without hemodynamically significant stenosis. Visualized subclavian arteries widely patent. Right carotid system: Right common carotid artery patent from its origin to the bifurcation without stenosis. Scattered mixed plaque about the proximal right ICA with associated stenosis of up to 50% by NASCET criteria. Right ICA otherwise widely patent to the skull base. Left carotid system: Left common carotid artery patent from its origin to the bifurcation without flow-limiting stenosis. Eccentric calcified plaque at the origin of the left ICA with associated stenosis of approximately 65-70% by NASCET criteria. Left ICA otherwise widely patent to the skull base. Vertebral arteries: Both of the vertebral arteries arise from the subclavian arteries. Dominant left vertebral artery widely patent within the neck. 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 Distal reconstitution via muscular branches at the distal right V2 segment. Right vertebral patent distally to the skull base without stenosis or other abnormality. Skeleton: No acute osseous finding. No discrete lytic or blastic osseous lesions. 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  Other neck: No other acute soft tissue abnormality within the neck. Upper chest: Upper lobe predominant emphysema. Superimposed 6 mm left upper lobe nodule (series 5, image 176). Visualized upper chest demonstrates no other acute abnormality. Review of the MIP images confirms the above findings CTA HEAD FINDINGS Anterior circulation: Focal atheromatous plaque within the petrous left ICA with mild stenosis. Petrous right ICA widely patent. Scattered atheromatous plaque within the cavernous/supraclinoid ICAs with mild to moderate multifocal narrowing (no more than 50%).  ICA termini well perfused. A1 segments widely patent bilaterally. Normal anterior communicating artery. Anterior cerebral arteries patent to their distal aspects without stenosis. M1 segments demonstrate atheromatous irregularity but are patent bilaterally without flow-limiting stenosis. Normal MCA bifurcations. No proximal M2 occlusion. Focal atherosclerotic plaque with associated severe left M2 stenosis noted (series 9, image 151). Distal MCA branches well perfused and symmetric. Posterior circulation: Dominant left vertebral artery patent as it crosses into the cranial vault. Atheromatous plaque within the mid-distal left V4 segment with associated moderate approximate 50% stenosis. Left PICA patent. Hypoplastic right vertebral artery largely terminates in PICA, although a tiny branch ascending towards the vertebrobasilar junction. Right PICA patent as well. Basilar demonstrates multifocal atheromatous irregularity short-segment mild stenosis noted within the mid basilar. Superior cerebral arteries patent proximally. Left PCA supplied via the basilar. Predominant fetal type origin of the right PCA. PCAs demonstrate scattered atheromatous irregularity but are patent to their distal aspects without flow-limiting stenosis. Venous sinuses: Grossly patent allowing for timing of the contrast bolus. Anatomic variants: Fetal type origin of the right PCA. No intracranial aneurysm or other vascular abnormality. Delayed phase: Not performed. Review of the MIP images confirms the above findings IMPRESSION: CT HEAD IMPRESSION: 1. Question subtle evolving hypodensity involving the supra ganglionic mid left frontal lobe, suspicious for acute left MCA territory infarct. No intracranial hemorrhage. 2. Aspects equals 9. 3. Underlying chronic anterior left frontal lobe infarct. 4. Age-related cerebral atrophy with mild chronic small vessel ischemic disease. CTA HEAD AND NECK IMPRESSION: 1. Negative CTA for large vessel occlusion.  2. 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 3. Approximate 50% stenosis about the proximal right ICA. 4. 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 Distal reconstitution at the distal right V2 segment. Dominant left vertebral artery widely patent within the neck. 5. 50% atheromatous stenosis involving the mid-distal left V4 segment. 6. Additional diffuse small vessel atheromatous irregularity throughout the intracranial circulation. 7. Emphysema. 8. 6 mm left upper lobe nodule, indeterminate. Non-contrast chest CT at 6-12 months is recommended. If the nodule is stable at time of repeat CT, then future CT at 18-24 months (from today's scan) is considered optional for low-risk patients, but is recommended for high-risk patients. This recommendation follows the consensus statement: Guidelines for Management of Incidental Pulmonary Nodules Detected on CT Images: From the Fleischner Society 2017; Radiology 2017; 284:228-243. Electronically Signed   By: Jeannine Boga M.D.   On: 05/31/2019 21:24    Danna Hefty, DO 05/31/2019, 10:29 PM PGY-1, [place] Intern pager: [phone], text pages welcome  FPTS Upper-Level Resident Addendum   I have independently interviewed and examined the patient. I have discussed the above with the original author and agree with their documentation. My edits for correction/addition/clarification are in blue. Please see also any attending notes.    Kathrene Alu, MD PGY-2, [place] [facility] 05/31/2019 11:07 PM  FPTS Service pager: [phone] (text pages welcome through [place])

## 2019-05-31 NOTE — Code Documentation (Signed)
Responded to Code Stroke called at [facility] for R sided deficits. Pt was talking on the phone at 1915 and developed garbled speech, word salad, and weakness per pt's wife. Pt arrived at 2005. [id number], CBG-[id number], NIH-19, CT negative for acute changes, CTA-no LVO. Plan to admit to medical team.

## 2019-06-01 ENCOUNTER — Inpatient Hospital Stay (HOSPITAL_COMMUNITY): Payer: Medicare HMO

## 2019-06-01 ENCOUNTER — Encounter (HOSPITAL_COMMUNITY): Payer: Self-pay | Admitting: *Deleted

## 2019-06-01 DIAGNOSIS — I639 Cerebral infarction, unspecified: Secondary | ICD-10-CM

## 2019-06-01 DIAGNOSIS — R911 Solitary pulmonary nodule: Secondary | ICD-10-CM

## 2019-06-01 DIAGNOSIS — I63419 Cerebral infarction due to embolism of unspecified middle cerebral artery: Secondary | ICD-10-CM

## 2019-06-01 LAB — ECHOCARDIOGRAM COMPLETE
Height: 72 in
Weight: 4303.38 oz

## 2019-06-01 LAB — RAPID URINE DRUG SCREEN, HOSP PERFORMED
Amphetamines: NOT DETECTED
Barbiturates: NOT DETECTED
Benzodiazepines: NOT DETECTED
Cocaine: NOT DETECTED
Opiates: NOT DETECTED
Tetrahydrocannabinol: NOT DETECTED

## 2019-06-01 LAB — BASIC METABOLIC PANEL
Anion gap: 9 (ref 5–15)
BUN: 10 mg/dL (ref 8–23)
CO2: 26 mmol/L (ref 22–32)
Calcium: 8.8 mg/dL — ABNORMAL LOW (ref 8.9–10.3)
Chloride: 108 mmol/L (ref 98–111)
Creatinine, Ser: 1.05 mg/dL (ref 0.61–1.24)
GFR calc Af Amer: >60 mL/min (ref >60)
GFR calc non Af Amer: >60 mL/min (ref >60)
Glucose, Bld: 170 mg/dL — ABNORMAL HIGH (ref 70–99)
Potassium: 3.6 mmol/L (ref 3.5–5.1)
Sodium: 143 mmol/L (ref 135–145)

## 2019-06-01 LAB — HEMOGLOBIN A1C
Hgb A1c MFr Bld: 7.3 % — ABNORMAL HIGH (ref 4.8–5.6)
Mean Plasma Glucose: 162.81 mg/dL

## 2019-06-01 LAB — CBC
HCT: 35.6 % — ABNORMAL LOW (ref 39.0–52.0)
Hemoglobin: 11.8 g/dL — ABNORMAL LOW (ref 13.0–17.0)
MCH: 28.8 pg (ref 26.0–34.0)
MCHC: 33.1 g/dL (ref 30.0–36.0)
MCV: 86.8 fL (ref 80.0–100.0)
Platelets: 245 10*3/uL (ref 150–400)
RBC: 4.1 MIL/uL — ABNORMAL LOW (ref 4.22–5.81)
RDW: 15.8 % — ABNORMAL HIGH (ref 11.5–15.5)
WBC: 6.5 10*3/uL (ref 4.0–10.5)
nRBC: 0 % (ref 0.0–0.2)

## 2019-06-01 LAB — LIPID PANEL
Cholesterol: 107 mg/dL (ref 0–200)
HDL: 32 mg/dL — ABNORMAL LOW (ref >40)
LDL Cholesterol: 49 mg/dL (ref 0–99)
Total CHOL/HDL Ratio: 3.3 RATIO
Triglycerides: 128 mg/dL (ref <150)
VLDL: 26 mg/dL (ref 0–40)

## 2019-06-01 LAB — URINALYSIS, ROUTINE W REFLEX MICROSCOPIC
Bilirubin Urine: NEGATIVE
Glucose, UA: NEGATIVE mg/dL
Hgb urine dipstick: NEGATIVE
Ketones, ur: NEGATIVE mg/dL
Leukocytes,Ua: NEGATIVE
Nitrite: NEGATIVE
Protein, ur: NEGATIVE mg/dL
Specific Gravity, Urine: 1.045 — ABNORMAL HIGH (ref 1.005–1.030)
pH: 5 (ref 5.0–8.0)

## 2019-06-01 LAB — GLUCOSE, CAPILLARY
Glucose-Capillary: 169 mg/dL — ABNORMAL HIGH (ref 70–99)
Glucose-Capillary: 156 mg/dL — ABNORMAL HIGH (ref 70–99)
Glucose-Capillary: 175 mg/dL — ABNORMAL HIGH (ref 70–99)
Glucose-Capillary: 166 mg/dL — ABNORMAL HIGH (ref 70–99)
Glucose-Capillary: 161 mg/dL — ABNORMAL HIGH (ref 70–99)

## 2019-06-01 LAB — TSH: TSH: 0.827 u[IU]/mL (ref 0.350–4.500)

## 2019-06-01 MED ORDER — ASPIRIN 300 MG RE SUPP
300.0000 mg | Freq: Every day | RECTAL | Status: DC
  Administered 2019-06-01: 300 mg via RECTAL
  Filled 2019-06-01 (×2): qty 1

## 2019-06-01 MED ORDER — ASPIRIN 325 MG PO TABS
325.0000 mg | ORAL_TABLET | Freq: Every day | ORAL | Status: DC
  Administered 2019-06-02: 325 mg via ORAL
  Filled 2019-06-01 (×2): qty 1

## 2019-06-01 NOTE — Consult Note (Signed)
[place] Nurse wound consult note Patient receiving care in [facility] 3W05. Reason for Consult: "likely venous stasis" Wound type: NO wounds present, only hemosiderin staining. Thank you for the consult.  Discussed plan of care with the patient.  [place] nurse will not follow at this time.  Please re-consult the [place] team if needed.  Val Riles, RN, MSN, CWOCN, CNS-BC, pager [phone]

## 2019-06-01 NOTE — Evaluation (Signed)
Clinical/Bedside Swallow Evaluation Patient Details  Name: Luke Rivera MRN: [id number] Date of Birth: 1941/05/26  Today's Date: 06/01/2019 Time: SLP Start Time (ACUTE ONLY): 1101 SLP Stop Time (ACUTE ONLY): 1128 SLP Time Calculation (min) (ACUTE ONLY): 27 min  Past Medical History: History reviewed. No pertinent past medical history. Past Surgical History: History reviewed. No pertinent surgical history. HPI:  78 y.o. male presenting with gargled speech and right sided deficits. 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 No intracranial hemorrhage. MRI pending    Assessment / Plan / Recommendation Clinical Impression   Pt presents with oropharyngeal dysphagia characterized by suspected delay in the initiation of the swallow, multiple swallows, and delayed throat clearing with ice chips/tsp water and puree consistencies.  R lingual deviation with minimal oral manipulation affected, but solids were not assessed at bedside d/t overt s/s of aspiration noted; recommend MBS to r/o aspiration and determine safest diet; ST will set goals after objective testing completed. SLP Visit Diagnosis: Dysphagia, oropharyngeal phase (R13.12)    Aspiration Risk  Risk for inadequate nutrition/hydration;Moderate aspiration risk    Diet Recommendation   NPO  Medication Administration: Via alternative means    Other  Recommendations Oral Care Recommendations: Oral care QID   Follow up Recommendations Other (comment)(TBD)      Frequency and Duration min 2x/week  1 week       Prognosis Prognosis for Safe Diet Advancement: Good      Swallow Study   General Date of Onset: 05/31/19 HPI: 78 y.o. male presenting with gargled speech and right sided  deficits. 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 No intracranial hemorrhage. MRI pending  Type of Study: Bedside Swallow Evaluation Previous Swallow Assessment: Failed [place] Diet Prior to this Study: NPO Temperature Spikes Noted: No Respiratory Status: Nasal cannula History of Recent Intubation: No Behavior/Cognition: Alert;Cooperative;Requires cueing;Distractible Oral Cavity Assessment: Dry Oral Care Completed by SLP: Recent completion by staff Oral Cavity - Dentition: Adequate natural dentition Self-Feeding Abilities: Needs assist Patient Positioning: Upright in bed Baseline Vocal Quality: Low vocal intensity;Other (comment)(Not observed d/t garbled speech) Volitional Cough: Strong Volitional Swallow: Able to elicit    Oral/Motor/Sensory Function Overall Oral Motor/Sensory Function: Mild impairment Facial ROM: Reduced right Facial Symmetry: Abnormal symmetry right Facial Strength: Reduced right Lingual ROM: Reduced right Lingual Symmetry: Abnormal symmetry right Lingual Strength: Reduced   Ice Chips Ice chips: Impaired Presentation: Spoon Pharyngeal Phase Impairments: Suspected delayed Swallow;Multiple swallows;Throat Clearing - Delayed   Thin Liquid Thin Liquid: Impaired Presentation: Spoon Pharyngeal  Phase Impairments: Suspected delayed Swallow;Multiple swallows;Throat Clearing - Delayed    Nectar Thick Nectar Thick Liquid: Not tested   Honey Thick Honey Thick Liquid: Not tested   Puree Puree: Impaired Presentation: Spoon Pharyngeal Phase Impairments: Multiple swallows;Throat Clearing - Delayed   Solid     Solid: Not tested      Elvina Sidle, M.S., CCC-SLP 06/01/2019,12:16 PM

## 2019-06-01 NOTE — Procedures (Signed)
Second echo attempt made. Patient being transported to another test. Will attempt again.

## 2019-06-01 NOTE — Discharge Summary (Addendum)
[facility] Discharge Summary  Patient name: Luke Rivera Medical record number: [id number] Date of birth: [birth year] Age: 78 y.o. 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 male in NAD Cardio: RRR no m/r/g Lungs: CTAB, no wheezing, no rhonchi, no crackles, no IWOB on RA Abdomen: Soft, non-tender to palpation, non-distended, positive bowel sounds Skin: warm and dry Extremities: No edema, 3/5 strength RUE/RLE Neuro: Dysarthric, able to state "why am I not at rehab?",  Follows simple commands  Brief Hospital Course:  Luke Rivera a 78 y.o.malepresenting with gargled speech and right sided deficits. 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  His hospital course as outlined below.  Patient presented with new onset expressive aphasia, right-sided facial droop, bilateral arm weakness.  He was found to have left MCA infarct on CT head as well as MRI consistent with new acute stroke in that area.  Also found to have chronic left frontal infarct.  Echo performed with EF of 55 to 24%, no diastolic dysfunction, no PFO visualized.  Re-stratification labs were obtained, LDL was 49, A1c 7.3.  Patient was followed by neurology during his hospitalization, but was not a candidate for TPA on presentation.  Neurology continue to discuss  patient with his wife, and noted that he had a remote history of atrial fibrillation and had been on cardiac monitoring in the past.  Due to this, and suspicion for embolic stroke without other cause found, decision was made to increase patient's Eliquis to 5 mg twice daily, for atrial fibrillation dosing.  He was also restarted on Plavix.  As an outpatient, patient should continue to take Eliquis 5 mg twice daily and Plavix daily.  He should not be on aspirin.  PT/OT saw patient and recommended CIR.  Patient was discharged to CIR on 6/29.  Patient also continue to work with speech therapy throughout his hospitalization.  At the time of discharge, patient was able to articulate some short sentences with dysarthria and had right sided hemiparesis, and would respond to simple commands.  During his hospitalization, patient was allowed for permissive hypertension up to 220/120 with goal to gradually normalize in 5 to 7 days.  Patient's blood pressure medications were held during his hospitalization, and continued to be held on discharge.  Would recommend that these are added back slowly over the next few days while at CIR.  Of note, patient did have a low-grade fever of 100.5 on 6/23 at 2000.  He remained with a normal white blood count during his hospitalization and showed no evidence of infection.  At the time of discharge she had been afebrile for >72 hrs hours.  Patient did have first-degree AV block noted on telemetry as well as two, 2-second pauses during hospitalization.  For this reason Lopressor continued to be held.  He was seen by cardiology while inpatient who recommended possible loop recorder as outpatient from primary cardiologist.  He has an outpatient cardiologist at [facility], and should  see them as an outpatient.  Issues for Follow Up:  1. Incidental finding of 6 mm left upper node nodule on head CTA.  Recommend repeat noncontrast chest CT in 6 to 12 months. 2. Patient started on Eliquis  5 mg twice daily for atrial fibrillation dosing per neurology recommendations.  Patient should also continue on Plavix. No aspirin.  3. Patient should follow-up as outpatient with cardiologist given possibility of atrial fibrillation, noted pause on telemetry.  Holding lopressor and should consider loop recorder with outpatient cardiologist. 4. Patient's lisinopril also held on discharge, can restart as needed as outpatient.  His home dose was 12.5mg  MWF. 5. Continue to monitor CBGs.  Was sent to CIR on sSSI and lantus 10u QHS. 45. Patient's wife noted during discharge conversation that he has "a place on his back" that he needs to follow up with the dermatologist about.  She reports a history of mole removals at [facility].  Please continue to monitor and ensure Derm follow up.  Significant Procedures: none  Significant Labs and Imaging:  Recent Labs  Lab 06/03/19 0402 06/04/19 0420 06/05/19 0344  WBC 6.4 6.1 6.4  HGB 12.2* 12.1* 12.9*  HCT 36.8* 36.0* 38.0*  PLT 252 238 265   Recent Labs  Lab 05/31/19 2009  06/02/19 0456 06/03/19 0402 06/03/19 0449 06/04/19 0420 06/05/19 0344 06/06/19 0611  NA 140   < > 142 140  --  140 140 139  K 3.7   < > 3.6 3.4*  --  3.5 3.4* 3.7  CL 106   < > 106 106  --  105 103 103  CO2 22   < > 26 26  --  25 25 24   GLUCOSE 202*   < > 178* 174*  --  165* 182* 213*  BUN 11   < > 12 15  --  16 16 17   CREATININE 1.08   < > 1.06 0.94  --  0.79 0.84 0.93  CALCIUM 8.9   < > 8.8* 8.8*  --  9.1 9.1 9.0  MG  --   --   --   --  1.7  --  2.0  --   ALKPHOS 74  --   --   --   --   --   --   --   AST 25  --   --   --   --   --   --   --   ALT 23  --   --   --   --   --   --   --   ALBUMIN 4.0  --   --   --   --   --   --   --    < > = values in this interval not displayed.    Echo 06/01/2019  1. The left ventricle has normal systolic function, with an ejection fraction of 55-60%. The cavity size was normal. There is mildly increased left ventricular wall thickness.  Left ventricular diastolic parameters were normal. No evidence of left  ventricular regional wall motion abnormalities.  2. The right ventricle has normal systolic function. The cavity was normal. There is no increase in right ventricular wall thickness.  3. Mild thickening of the mitral valve leaflet. No evidence of mitral valve stenosis.  4. The aortic valve is tricuspid. Mild thickening of the aortic valve. Mild calcification of the aortic valve. No stenosis of the aortic valve.  5. The aortic arch and aortic  root are normal in size and structure.  6. No intracardiac thrombi or masses were visualized.  7. The interatrial septum was not well visualized.  Ct Angio Head W Or Wo Contrast  Result Date: 05/31/2019 CLINICAL DATA:  Initial evaluation for possible stroke, right-sided deficits. EXAM: CT ANGIOGRAPHY HEAD AND NECK TECHNIQUE: Multidetector CT imaging of the head and neck was performed using the standard protocol during bolus administration of intravenous contrast. Multiplanar CT image reconstructions and MIPs were obtained to evaluate the vascular anatomy. Carotid stenosis measurements (when applicable) are obtained utilizing NASCET criteria, using the distal internal carotid diameter as the denominator. CONTRAST:  42mL OMNIPAQUE IOHEXOL 350 MG/ML SOLN COMPARISON:  None available. FINDINGS: CT HEAD FINDINGS Brain: Generalized age-related cerebral atrophy with mild chronic small vessel ischemic disease. 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 There is question of subtle evolving hypodensity involving the supra ganglionic cortical gray matter, M5 distribution (series 6, image 43). No other definite acute large vessel territory infarct. No acute intracranial hemorrhage. No mass lesion or midline shift. No hydrocephalus. No extra-axial fluid collection. Vascular: No hyperdense vessel. Calcified atherosclerosis present at  skull base. Skull: Scalp soft tissues and calvarium within normal limits. Sinuses/Orbits: Globes and orbital soft tissues within normal limits. Chronic right sphenoid sinus disease. Mucosal thickening noted within the inferior left maxillary sinus. Paranasal sinuses are otherwise clear. Small right mastoid effusion noted. Other: None. ASPECTS [facility] Stroke Program Early CT Score) - Ganglionic level infarction (caudate, lentiform nuclei, internal capsule, insula, M1-M3 cortex): 7 - Supraganglionic infarction (M4-M6 cortex): 2 Total score (0-10 with 10 being normal): 9 Review of the MIP images confirms the above findings CTA NECK FINDINGS Aortic arch: Visualized arch of normal caliber with normal 3 vessel morphology. Mild atheromatous change about the origin of the great vessels without hemodynamically significant stenosis. Visualized subclavian arteries widely patent. Right carotid system: Right common carotid artery patent from its origin to the bifurcation without stenosis. Scattered mixed plaque about the proximal right ICA with associated stenosis of up to 50% by NASCET criteria. Right ICA otherwise widely patent to the skull base. Left carotid system: Left common carotid artery patent from its origin to the bifurcation without flow-limiting stenosis. Eccentric calcified plaque at the origin of the left ICA with associated stenosis of approximately 65-70% by NASCET criteria. Left ICA otherwise widely patent to the skull base. Vertebral arteries: Both of the vertebral arteries arise from the subclavian arteries. Dominant left vertebral artery widely patent within the neck. 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 Distal reconstitution via muscular branches at the distal right V2 segment. Right vertebral patent distally to the skull base without stenosis or other abnormality. Skeleton: No acute osseous finding. No discrete lytic or blastic osseous lesions. Moderate cervical  spondylolysis noted, most pronounced at C6-7. Other neck: No other acute soft tissue abnormality within the neck. Upper chest: Upper lobe predominant emphysema. Superimposed 6 mm left upper lobe nodule (series 5, image 176). Visualized upper chest demonstrates no other acute abnormality. Review of the MIP images confirms the above findings CTA HEAD FINDINGS Anterior circulation: Focal atheromatous plaque within the petrous left ICA with mild stenosis. Petrous right ICA widely patent. Scattered atheromatous plaque within the cavernous/supraclinoid ICAs with mild to moderate multifocal narrowing (no more than 50%). ICA termini well perfused. A1 segments widely patent bilaterally. Normal anterior communicating artery. Anterior cerebral arteries patent to their distal aspects without stenosis. M1 segments demonstrate atheromatous irregularity  but are patent bilaterally without flow-limiting stenosis. Normal MCA bifurcations. No proximal M2 occlusion. Focal atherosclerotic plaque with associated severe left M2 stenosis noted (series 9, image 151). Distal MCA branches well perfused and symmetric. Posterior circulation: Dominant left vertebral artery patent as it crosses into the cranial vault. Atheromatous plaque within the mid-distal left V4 segment with associated moderate approximate 50% stenosis. Left PICA patent. Hypoplastic right vertebral artery largely terminates in PICA, although a tiny branch ascending towards the vertebrobasilar junction. Right PICA patent as well. Basilar demonstrates multifocal atheromatous irregularity short-segment mild stenosis noted within the mid basilar. Superior cerebral arteries patent proximally. Left PCA supplied via the basilar. Predominant fetal type origin of the right PCA. PCAs demonstrate scattered atheromatous irregularity but are patent to their distal aspects without flow-limiting stenosis. Venous sinuses: Grossly patent allowing for timing of the contrast bolus. Anatomic  variants: Fetal type origin of the right PCA. No intracranial aneurysm or other vascular abnormality. Delayed phase: Not performed. Review of the MIP images confirms the above findings IMPRESSION: CT HEAD IMPRESSION: 1. Question subtle evolving hypodensity involving the supra ganglionic mid left frontal lobe, suspicious for acute left MCA territory infarct. No intracranial hemorrhage. 2. Aspects equals 9. 3. Underlying chronic anterior left frontal lobe infarct. 4. Age-related cerebral atrophy with mild chronic small vessel ischemic disease. CTA HEAD AND NECK IMPRESSION: 1. Negative CTA for large vessel occlusion. 2. 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 3. Approximate 50% stenosis about the proximal right ICA. 4. 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 Distal reconstitution at the distal right V2 segment. Dominant left vertebral artery widely patent within the neck. 5. 50% atheromatous stenosis involving the mid-distal left V4 segment. 6. Additional diffuse small vessel atheromatous irregularity throughout the intracranial circulation. 7. Emphysema. 8. 6 mm left upper lobe nodule, indeterminate. Non-contrast chest CT at 6-12 months is recommended. If the nodule is stable at time of repeat CT, then future CT at 18-24 months (from today's scan) is considered optional for low-risk patients, but is recommended for high-risk patients. This recommendation follows the consensus statement: Guidelines for Management of Incidental Pulmonary Nodules Detected on CT Images: From the Fleischner Society 2017; Radiology 2017; 284:228-243. Electronically Signed   By: Jeannine Boga M.D.   On: 05/31/2019 21:24   Dg Chest 1 View  Result Date: 06/01/2019 CLINICAL DATA:  Altered mental status today. EXAM: CHEST  1 VIEW COMPARISON:  None. FINDINGS: The lungs are clear. Heart size is normal. No pneumothorax or pleural effusion. No acute or focal bony abnormality IMPRESSION: Negative  chest. Electronically Signed   By: Inge Rise M.D.   On: 06/01/2019 10:29   Dg Abd 1 View  Result Date: 06/01/2019 CLINICAL DATA:  Altered mental status. EXAM: ABDOMEN - 1 VIEW COMPARISON:  None. FINDINGS: The bowel gas pattern is normal. No radio-opaque calculi or other significant radiographic abnormality are seen. Surgical clips right upper quadrant and spinal fusion are noted. IMPRESSION: Negative exam. Electronically Signed   By: Inge Rise M.D.   On: 06/01/2019 10:34   Ct Angio Neck W Or Wo Contrast  Result Date: 05/31/2019 CLINICAL DATA:  Initial evaluation for possible stroke, right-sided deficits. EXAM: CT ANGIOGRAPHY HEAD AND NECK TECHNIQUE: Multidetector CT imaging of the head and neck was performed using the standard protocol during bolus administration of intravenous contrast. Multiplanar CT image reconstructions and MIPs were obtained to evaluate the vascular anatomy. Carotid stenosis measurements (when applicable) are obtained utilizing NASCET criteria, using the distal  internal carotid diameter as the denominator. CONTRAST:  20mL OMNIPAQUE IOHEXOL 350 MG/ML SOLN COMPARISON:  None available. FINDINGS: CT HEAD FINDINGS Brain: Generalized age-related cerebral atrophy with mild chronic small vessel ischemic disease. 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 There is question of subtle evolving hypodensity involving the supra ganglionic cortical gray matter, M5 distribution (series 6, image 43). No other definite acute large vessel territory infarct. No acute intracranial hemorrhage. No mass lesion or midline shift. No hydrocephalus. No extra-axial fluid collection. Vascular: No hyperdense vessel. Calcified atherosclerosis present at skull base. Skull: Scalp soft tissues and calvarium within normal limits. Sinuses/Orbits: Globes and orbital soft tissues within normal limits. Chronic right sphenoid sinus  disease. Mucosal thickening noted within the inferior left maxillary sinus. Paranasal sinuses are otherwise clear. Small right mastoid effusion noted. Other: None. ASPECTS [facility] Stroke Program Early CT Score) - Ganglionic level infarction (caudate, lentiform nuclei, internal capsule, insula, M1-M3 cortex): 7 - Supraganglionic infarction (M4-M6 cortex): 2 Total score (0-10 with 10 being normal): 9 Review of the MIP images confirms the above findings CTA NECK FINDINGS Aortic arch: Visualized arch of normal caliber with normal 3 vessel morphology. Mild atheromatous change about the origin of the great vessels without hemodynamically significant stenosis. Visualized subclavian arteries widely patent. Right carotid system: Right common carotid artery patent from its origin to the bifurcation without stenosis. Scattered mixed plaque about the proximal right ICA with associated stenosis of up to 50% by NASCET criteria. Right ICA otherwise widely patent to the skull base. Left carotid system: Left common carotid artery patent from its origin to the bifurcation without flow-limiting stenosis. Eccentric calcified plaque at the origin of the left ICA with associated stenosis of approximately 65-70% by NASCET criteria. Left ICA otherwise widely patent to the skull base. Vertebral arteries: Both of the vertebral arteries arise from the subclavian arteries. Dominant left vertebral artery widely patent within the neck. 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 Distal reconstitution via muscular branches at the distal right V2 segment. Right vertebral patent distally to the skull base without stenosis or other abnormality. Skeleton: No acute osseous finding. No discrete lytic or blastic osseous lesions. 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 Other neck: No other acute soft tissue abnormality within the neck. Upper chest: Upper lobe predominant emphysema. Superimposed 6 mm  left upper lobe nodule (series 5, image 176). Visualized upper chest demonstrates no other acute abnormality. Review of the MIP images confirms the above findings CTA HEAD FINDINGS Anterior circulation: Focal atheromatous plaque within the petrous left ICA with mild stenosis. Petrous right ICA widely patent. Scattered atheromatous plaque within the cavernous/supraclinoid ICAs with mild to moderate multifocal narrowing (no more than 50%). ICA termini well perfused. A1 segments widely patent bilaterally. Normal anterior communicating artery. Anterior cerebral arteries patent to their distal aspects without stenosis. M1 segments demonstrate atheromatous irregularity but are patent bilaterally without flow-limiting stenosis. Normal MCA bifurcations. No proximal M2 occlusion. Focal atherosclerotic plaque with associated severe left M2 stenosis noted (series 9, image 151). Distal MCA branches well perfused and symmetric. Posterior circulation: Dominant left vertebral artery patent as it crosses into the cranial vault. Atheromatous plaque within the mid-distal left V4 segment with associated moderate approximate 50% stenosis. Left PICA patent. Hypoplastic right vertebral artery largely terminates in PICA, although a tiny branch ascending towards the vertebrobasilar junction. Right PICA patent as well. Basilar demonstrates multifocal atheromatous irregularity short-segment mild stenosis noted within the mid  basilar. Superior cerebral arteries patent proximally. Left PCA supplied via the basilar. Predominant fetal type origin of the right PCA. PCAs demonstrate scattered atheromatous irregularity but are patent to their distal aspects without flow-limiting stenosis. Venous sinuses: Grossly patent allowing for timing of the contrast bolus. Anatomic variants: Fetal type origin of the right PCA. No intracranial aneurysm or other vascular abnormality. Delayed phase: Not performed. Review of the MIP images confirms the above  findings IMPRESSION: CT HEAD IMPRESSION: 1. Question subtle evolving hypodensity involving the supra ganglionic mid left frontal lobe, suspicious for acute left MCA territory infarct. No intracranial hemorrhage. 2. Aspects equals 9. 3. Underlying chronic anterior left frontal lobe infarct. 4. Age-related cerebral atrophy with mild chronic small vessel ischemic disease. CTA HEAD AND NECK IMPRESSION: 1. Negative CTA for large vessel occlusion. 2. 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 3. Approximate 50% stenosis about the proximal right ICA. 4. 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 Distal reconstitution at the distal right V2 segment. Dominant left vertebral artery widely patent within the neck. 5. 50% atheromatous stenosis involving the mid-distal left V4 segment. 6. Additional diffuse small vessel atheromatous irregularity throughout the intracranial circulation. 7. Emphysema. 8. 6 mm left upper lobe nodule, indeterminate. Non-contrast chest CT at 6-12 months is recommended. If the nodule is stable at time of repeat CT, then future CT at 18-24 months (from today's scan) is considered optional for low-risk patients, but is recommended for high-risk patients. This recommendation follows the consensus statement: Guidelines for Management of Incidental Pulmonary Nodules Detected on CT Images: From the Fleischner Society 2017; Radiology 2017; 284:228-243. Electronically Signed   By: Jeannine Boga M.D.   On: 05/31/2019 21:24   Mr Brain Wo Contrast  Result Date: 06/01/2019 CLINICAL DATA:  Right-sided neurological deficits. Speech disturbance. EXAM: MRI HEAD WITHOUT CONTRAST TECHNIQUE: Multiplanar, multiecho pulse sequences of the brain and surrounding structures were obtained without intravenous contrast. COMPARISON:  Head CT and CTA 05/31/2019 FINDINGS: The patient was unable to tolerate the complete examination. Sagittal T1 and coronal T2 sequences were not obtained.  Brain: Patchy acute infarcts are present in the left MCA territory with the most confluent area of infarction involving the posterior left frontal lobe including precentral gyrus. Smaller acute infarcts involve left parietal and left temporal lobe cortex as well as left basal ganglia. No intracranial hemorrhage, mass, midline shift, or extra-axial fluid collection is identified. A chronic infarct is noted in the left frontal lobe anterior to the operculum. Mild cerebral atrophy is not greater than expected for age. Punctate chronic infarcts are present in the cerebellum bilaterally. Vascular: Major intracranial vascular flow voids are preserved with the left vertebral artery being dominant. Skull and upper cervical spine: No suspicious marrow lesion. Sinuses/Orbits: Mild mucosal thickening in the paranasal sinuses. Moderate right mastoid effusion. Bilateral cataract extraction. Other: None. IMPRESSION: 1. 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 2. Chronic left frontal infarct. Electronically Signed   By: Logan Bores M.D.   On: 06/01/2019 15:05   Dg Swallowing Func-speech Pathology  Result Date: 06/01/2019 Objective Swallowing Evaluation: Type of Study: MBS-Modified Barium Swallow Study  Patient Details Name: Amiri Riechers MRN: [id number] Date of Birth: November 06, 1941 Today's Date: 06/01/2019 Time: SLP Start Time (ACUTE ONLY): 1335 -SLP Stop Time (ACUTE ONLY): 1352 SLP Time Calculation (min) (ACUTE ONLY): 17 min Past Medical History: No past medical history on file. Past Surgical History: No past surgical history on file. HPI: 78 y.o. male  presenting with gargled speech and right sided deficits. 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 CT head revealing Question subtle evolving hypodensity involving the supra ganglionic mid left frontal lobe, suspicious for acute left MCA  territory infarct. No intracranial hemorrhage. MRI pending  No data recorded Assessment / Plan / Recommendation CHL IP CLINICAL IMPRESSIONS 06/01/2019 Clinical Impression Pt presents with mild oropharyngeal dysphagia characterized by decreased oral propulsion/impaired mastication and a delay to the vallecular space with thin via tsp; delay to pyriform sinus with nectar consistency also noted; straw sips were attempted with thin/nectar, but unsuccessful d/t decreased mentation (ie: biting on straw despite mod verbal cues); no penetration/aspiration noted during study; pt's mentation decreased overall during exam,so initiation of a conservative diet of Dysphagia 2/thin via tsp recommended with ST f/u for diet tolerance and advancement. SLP Visit Diagnosis Dysphagia, oropharyngeal phase (R13.12);Aphasia (R47.01) Attention and concentration deficit following -- Frontal lobe and executive function deficit following -- Impact on safety and function Mild aspiration risk   CH[facility] IP TREATMENT RECOMMENDATION 06/01/2019 Treatment Recommendations Therapy as outlined in treatment plan below   Prognosis 06/01/2019 Prognosis for Safe Diet Advancement Good Barriers to Reach Goals Language deficits Barriers/Prognosis Comment -- [facility] IP DIET RECOMMENDATION 06/01/2019 SLP Diet Recommendations Dysphagia 2 (Fine chop) solids;Thin liquid;Other (Comment) Liquid Administration via No straw;Cup;Other (Comment) Medication Administration Crushed with puree Compensations Slow rate;Small sips/bites;Effortful swallow;Multiple dry swallows after each bite/sip Postural Changes Seated upright at 90 degrees   CHL IP OTHER RECOMMENDATIONS 06/01/2019 Recommended Consults -- Oral Care Recommendations Oral care BID Other Recommendations --   [facility] IP FOLLOW UP RECOMMENDATIONS 06/01/2019 Follow up Recommendations Other (comment)   [facility] IP FREQUENCY AND DURATION 06/01/2019 Speech Therapy Frequency (ACUTE ONLY) min 2x/week Treatment Duration 1 week      [facility] IP ORAL  PHASE 06/01/2019 Oral Phase Impaired Oral - Pudding Teaspoon -- Oral - Pudding Cup -- Oral - Honey Teaspoon -- Oral - Honey Cup -- Oral - Nectar Teaspoon -- Oral - Nectar Cup -- Oral - Nectar Straw -- Oral - Thin Teaspoon -- Oral - Thin Cup -- Oral - Thin Straw -- Oral - Puree -- Oral - Mech Soft Impaired mastication Oral - Regular -- Oral - Multi-Consistency -- Oral - Pill -- Oral Phase - Comment --  [facility] IP PHARYNGEAL PHASE 06/01/2019 Pharyngeal Phase Impaired Pharyngeal- Pudding Teaspoon -- Pharyngeal -- Pharyngeal- Pudding Cup -- Pharyngeal -- Pharyngeal- Honey Teaspoon -- Pharyngeal -- Pharyngeal- Honey Cup -- Pharyngeal -- Pharyngeal- Nectar Teaspoon Delayed swallow initiation-pyriform sinuses Pharyngeal -- Pharyngeal- Nectar Cup -- Pharyngeal -- Pharyngeal- Nectar Straw -- Pharyngeal -- Pharyngeal- Thin Teaspoon Delayed swallow initiation-vallecula Pharyngeal -- Pharyngeal- Thin Cup -- Pharyngeal -- Pharyngeal- Thin Straw -- Pharyngeal -- Pharyngeal- Puree Other (Comment) Pharyngeal -- Pharyngeal- Mechanical Soft Other (Comment) Pharyngeal -- Pharyngeal- Regular -- Pharyngeal -- Pharyngeal- Multi-consistency -- Pharyngeal -- Pharyngeal- Pill -- Pharyngeal -- Pharyngeal Comment --  [facility] IP CERVICAL ESOPHAGEAL PHASE 06/01/2019 Cervical Esophageal Phase WFL Pudding Teaspoon -- Pudding Cup -- Honey Teaspoon -- Honey Cup -- Nectar Teaspoon -- Nectar Cup -- Nectar Straw -- Thin Teaspoon -- Thin Cup -- Thin Straw -- Puree -- Mechanical Soft -- Regular -- Multi-consistency -- Pill -- Cervical Esophageal Comment -- Elvina Sidle, M.S., CCC-SLP 06/01/2019, 2:39 PM              Ct Head Code Stroke Wo Contrast  Result Date: 05/31/2019 CLINICAL  DATA:  Initial evaluation for possible stroke, right-sided deficits. EXAM: CT ANGIOGRAPHY HEAD AND NECK TECHNIQUE: Multidetector CT imaging of the head and neck was performed using the standard protocol during bolus administration of intravenous contrast. Multiplanar CT image  reconstructions and MIPs were obtained to evaluate the vascular anatomy. Carotid stenosis measurements (when applicable) are obtained utilizing NASCET criteria, using the distal internal carotid diameter as the denominator. CONTRAST:  2mL OMNIPAQUE IOHEXOL 350 MG/ML SOLN COMPARISON:  None available. FINDINGS: CT HEAD FINDINGS Brain: Generalized age-related cerebral atrophy with mild chronic small vessel ischemic disease. 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 There is question of subtle evolving hypodensity involving the supra ganglionic cortical gray matter, M5 distribution (series 6, image 43). No other definite acute large vessel territory infarct. No acute intracranial hemorrhage. No mass lesion or midline shift. No hydrocephalus. No extra-axial fluid collection. Vascular: No hyperdense vessel. Calcified atherosclerosis present at skull base. Skull: Scalp soft tissues and calvarium within normal limits. Sinuses/Orbits: Globes and orbital soft tissues within normal limits. Chronic right sphenoid sinus disease. Mucosal thickening noted within the inferior left maxillary sinus. Paranasal sinuses are otherwise clear. Small right mastoid effusion noted. Other: None. ASPECTS [facility] Stroke Program Early CT Score) - Ganglionic level infarction (caudate, lentiform nuclei, internal capsule, insula, M1-M3 cortex): 7 - Supraganglionic infarction (M4-M6 cortex): 2 Total score (0-10 with 10 being normal): 9 Review of the MIP images confirms the above findings CTA NECK FINDINGS Aortic arch: Visualized arch of normal caliber with normal 3 vessel morphology. Mild atheromatous change about the origin of the great vessels without hemodynamically significant stenosis. Visualized subclavian arteries widely patent. Right carotid system: Right common carotid artery patent from its origin to the bifurcation without stenosis. Scattered mixed plaque about  the proximal right ICA with associated stenosis of up to 50% by NASCET criteria. Right ICA otherwise widely patent to the skull base. Left carotid system: Left common carotid artery patent from its origin to the bifurcation without flow-limiting stenosis. Eccentric calcified plaque at the origin of the left ICA with associated stenosis of approximately 65-70% by NASCET criteria. Left ICA otherwise widely patent to the skull base. Vertebral arteries: Both of the vertebral arteries arise from the subclavian arteries. Dominant left vertebral artery widely patent within the neck. 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 Distal reconstitution via muscular branches at the distal right V2 segment. Right vertebral patent distally to the skull base without stenosis or other abnormality. Skeleton: No acute osseous finding. No discrete lytic or blastic osseous lesions. 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 Other neck: No other acute soft tissue abnormality within the neck. Upper chest: Upper lobe predominant emphysema. Superimposed 6 mm left upper lobe nodule (series 5, image 176). Visualized upper chest demonstrates no other acute abnormality. Review of the MIP images confirms the above findings CTA HEAD FINDINGS Anterior circulation: Focal atheromatous plaque within the petrous left ICA with mild stenosis. Petrous right ICA widely patent. Scattered atheromatous plaque within the cavernous/supraclinoid ICAs with mild to moderate multifocal narrowing (no more than 50%). ICA termini well perfused. A1 segments widely patent bilaterally. Normal anterior communicating artery. Anterior cerebral arteries patent to their distal aspects without stenosis. M1 segments demonstrate atheromatous irregularity but are patent bilaterally without flow-limiting stenosis. Normal MCA bifurcations. No proximal M2 occlusion. Focal atherosclerotic plaque with associated severe left M2  stenosis noted (series 9, image 151). Distal MCA branches well perfused and symmetric. Posterior circulation:  Dominant left vertebral artery patent as it crosses into the cranial vault. Atheromatous plaque within the mid-distal left V4 segment with associated moderate approximate 50% stenosis. Left PICA patent. Hypoplastic right vertebral artery largely terminates in PICA, although a tiny branch ascending towards the vertebrobasilar junction. Right PICA patent as well. Basilar demonstrates multifocal atheromatous irregularity short-segment mild stenosis noted within the mid basilar. Superior cerebral arteries patent proximally. Left PCA supplied via the basilar. Predominant fetal type origin of the right PCA. PCAs demonstrate scattered atheromatous irregularity but are patent to their distal aspects without flow-limiting stenosis. Venous sinuses: Grossly patent allowing for timing of the contrast bolus. Anatomic variants: Fetal type origin of the right PCA. No intracranial aneurysm or other vascular abnormality. Delayed phase: Not performed. Review of the MIP images confirms the above findings IMPRESSION: CT HEAD IMPRESSION: 1. Question subtle evolving hypodensity involving the supra ganglionic mid left frontal lobe, suspicious for acute left MCA territory infarct. No intracranial hemorrhage. 2. Aspects equals 9. 3. Underlying chronic anterior left frontal lobe infarct. 4. Age-related cerebral atrophy with mild chronic small vessel ischemic disease. CTA HEAD AND NECK IMPRESSION: 1. Negative CTA for large vessel occlusion. 2. 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 3. Approximate 50% stenosis about the proximal right ICA. 4. 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 Distal reconstitution at the distal right V2 segment. Dominant left vertebral artery widely patent within the neck. 5. 50% atheromatous stenosis involving the mid-distal left V4 segment. 6. Additional diffuse small  vessel atheromatous irregularity throughout the intracranial circulation. 7. Emphysema. 8. 6 mm left upper lobe nodule, indeterminate. Non-contrast chest CT at 6-12 months is recommended. If the nodule is stable at time of repeat CT, then future CT at 18-24 months (from today's scan) is considered optional for low-risk patients, but is recommended for high-risk patients. This recommendation follows the consensus statement: Guidelines for Management of Incidental Pulmonary Nodules Detected on CT Images: From the Fleischner Society 2017; Radiology 2017; 284:228-243. Electronically Signed   By: Jeannine Boga M.D.   On: 05/31/2019 21:24   Results/Tests Pending at Time of Discharge: None  Discharge Medications:  Allergies as of 06/07/2019   No Known Allergies     Medication List    STOP taking these medications   furosemide 40 MG tablet Commonly known as: LASIX   gabapentin 300 MG capsule Commonly known as: NEURONTIN   HM Potassium 595 (99 K) MG Tabs tablet Generic drug: potassium gluconate   losartan 25 MG tablet Commonly known as: COZAAR   metFORMIN 1000 MG tablet Commonly known as: GLUCOPHAGE   metoprolol tartrate 25 MG tablet Commonly known as: LOPRESSOR   nystatin powder Generic drug: nystatin   vitamin B-12 500 MCG tablet Commonly known as: CYANOCOBALAMIN   Vitamin D 50 MCG (2000 UT) Caps     TAKE these medications   acetaminophen 325 MG tablet Commonly known as: TYLENOL Take 2 tablets (650 mg total) by mouth every 4 (four) hours as needed for mild pain (or temp > 37.5 C (99.5 F)).   apixaban 5 MG Tabs tablet Commonly known as: ELIQUIS Take 1 tablet (5 mg total) by mouth 2 (two) times daily. What changed: how much to take   atorvastatin 80 MG tablet Commonly known as: LIPITOR Take 40 mg by mouth at bedtime.   clopidogrel 75 MG tablet Commonly known as: PLAVIX Take 75 mg by mouth daily.   feeding supplement (ENSURE ENLIVE) Liqd Take 237 mLs by mouth 3  (  three) times daily between meals.   insulin aspart 100 UNIT/ML injection Commonly known as: novoLOG Inject 0-9 Units into the skin 3 (three) times daily with meals.   insulin glargine 100 UNIT/ML injection Commonly known as: LANTUS Inject 0.1 mLs (10 Units total) into the skin at bedtime. What changed: how much to take   multivitamin with minerals Tabs tablet Take 1 tablet by mouth daily.   nitroGLYCERIN 0.6 MG SL tablet Commonly known as: NITROSTAT Place 0.6 mg under the tongue as needed for chest pain.   pantoprazole 40 MG tablet Commonly known as: PROTONIX Take 40 mg by mouth daily.       Discharge Instructions: Please refer to Patient Instructions section of EMR for full details.  Patient was counseled important signs and symptoms that should prompt return to medical care, changes in medications, dietary instructions, activity restrictions, and follow up appointments.   Follow-Up Appointments:   Should follow up with neurology, cardiology, and PCP on discharge from CIR.  [place], DO 06/07/2019, 12:47 PM PGY-1, [place]

## 2019-06-01 NOTE — Evaluation (Signed)
Occupational Therapy Evaluation Patient Details Name: Luke Rivera MRN: [id number] DOB: 06-Jun-1941 Today's Date: 06/01/2019    History of Present Illness 78 y.o. male presenting with gargled speech and right sided deficits. 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 No intracranial hemorrhage. MRI pending   Clinical Impression   Pt admitted with above and presents to OT with deficits impacting ability to complete ADLs and PLOF.  Ptt unable to provide any info about prior function or home environment, information gathered from chart review.  Pt required Max assist +2 for bed mobility and attempts at sit > stand.  Tone noted in RUE and RLE with inability to move Rt side against gravity, no active ROM.  Unable to complete sit > stand from EOB despite +2 assistance, however pt with initiation with weight shift during boost and scoot towards HOB.  Pt will benefit from OT acutely to decrease burden of care with recommendation for CIR level therapies prior to d/c home.    Follow Up Recommendations  CIR    Equipment Recommendations  Other (comment)(TBD)    Recommendations for Other Services Rehab consult     Precautions / Restrictions Precautions Precautions: Fall Restrictions Weight Bearing Restrictions: No      Mobility Bed Mobility Overal bed mobility: Needs Assistance Bed Mobility: Supine to Sit;Sit to Supine;Rolling Rolling: Mod assist   Supine to sit: +2 for physical assistance;Mod assist Sit to supine: Mod assist;Max assist;+2 for physical assistance   General bed mobility comments: requires assist for LE and trunk support to come into sitting - up to Mod A for seated balance  Transfers                 General  transfer comment: attempted to stand with total A+2 - unable to clear buttocks from bed; regressed to boosting with lateral scooting with Max/Total A +2    Balance Overall balance assessment: Needs assistance Sitting-balance support: Single extremity supported;Feet supported Sitting balance-Leahy Scale: Poor                                     ADL either performed or assessed with clinical judgement   ADL Overall ADL's : Needs assistance/impaired         Upper Body Bathing: Maximal assistance   Lower Body Bathing: +2 for physical assistance;Bed level   Upper Body Dressing : Maximal assistance   Lower Body Dressing: +2 for physical assistance;Bed level               Functional mobility during ADLs: +2 for physical assistance;Maximal assistance General ADL Comments: completed rolling at bed level and up to sitting at EOB.  Pt required max multimodal cues due impaired cognition with decreased initation and inattention to RUE/RLE.  Inability to complete sit > stand despite +2, therefore engaged in lateral scoots to reposition back in bed - noted minimal initiation with weight shifting for scooting     Vision   Vision Assessment?: Vision impaired- to be further tested in functional context Additional Comments: Pt able to scan in all directions, difficult to fully assess due to cognitive impairments            Pertinent Vitals/Pain Pain  Assessment: Faces Faces Pain Scale: Hurts little more Pain Location: generalized Pain Descriptors / Indicators: Discomfort;Grimacing;Guarding Pain Intervention(s): Monitored during session     Hand Dominance Right   Extremity/Trunk Assessment Upper Extremity Assessment Upper Extremity Assessment: Defer to OT evaluation RUE Deficits / Details: increased extensor tone, inattention to RUE, difficulty following directions due to impaired cognition RUE Sensation: decreased light touch;decreased proprioception RUE  Coordination: decreased fine motor;decreased gross motor   Lower Extremity Assessment Lower Extremity Assessment: RLE deficits/detail RLE Deficits / Details: does not attempt to lift against gravity RLE Sensation: decreased light touch;decreased proprioception RLE Coordination: decreased fine motor;decreased gross motor       Communication Communication Communication: Expressive difficulties(garbled, unintelligble speech)   Cognition Arousal/Alertness: Lethargic Behavior During Therapy: Flat affect Overall Cognitive Status: Difficult to assess                                                Home Living Family/patient expects to be discharged to:: Private residence Living Arrangements: Spouse/significant other                               Additional Comments: unsure as patient with unintelligble speech      Prior Functioning/Environment Level of Independence: Independent with assistive device(s)        Comments: per chart review pt used "walking stick" but did not move around much; has residual memory impairment with problems processing information at a normal pace.        OT Problem List: Decreased strength;Decreased range of motion;Decreased activity tolerance;Impaired balance (sitting and/or standing);Decreased coordination;Decreased cognition;Decreased safety awareness;Impaired sensation;Impaired tone;Impaired UE functional use;Pain      OT Treatment/Interventions: Self-care/ADL training;Neuromuscular education;DME and/or AE instruction;Cognitive remediation/compensation;Patient/family education;Balance training;Therapeutic activities    OT Goals(Current goals can be found in the care plan section) Acute Rehab OT Goals Patient Stated Goal: none stated OT Goal Formulation: With patient Time For Goal Achievement: 06/15/19 Potential to Achieve Goals: Fair  OT Frequency: Min 3X/week   Barriers to D/C: Decreased caregiver support         Co-evaluation PT/OT/SLP Co-Evaluation/Treatment: Yes Reason for Co-Treatment: Complexity of the patient's impairments (multi-system involvement);Necessary to address cognition/behavior during functional activity;For patient/therapist safety;To address functional/ADL transfers PT goals addressed during session: Mobility/safety with mobility;Balance OT goals addressed during session: ADL's and self-care      AM-PAC OT "6 Clicks" Daily Activity     Outcome Measure Help from another person eating meals?: Total Help from another person taking care of personal grooming?: Total Help from another person toileting, which includes using toliet, bedpan, or urinal?: Total Help from another person bathing (including washing, rinsing, drying)?: Total Help from another person to put on and taking off regular upper body clothing?: Total Help from another person to put on and taking off regular lower body clothing?: Total 6 Click Score: 6   End of Session Equipment Utilized During Treatment: Gait belt;Oxygen Nurse Communication: Mobility status  Activity Tolerance: Patient limited by fatigue;Patient limited by lethargy Patient left: in bed;with call bell/phone within reach;with bed alarm set  OT Visit Diagnosis: Unsteadiness on feet (R26.81);Muscle weakness (generalized) (M62.81);Apraxia (R48.2);Hemiplegia and hemiparesis;Pain Hemiplegia - Right/Left: Right Hemiplegia - dominant/non-dominant: Dominant Pain - part of body: (unable to state)  Time: 6712-4580 OT Time Calculation (min): 24 min Charges:  OT General Charges $OT Visit: 1 Visit OT Evaluation $OT Eval Moderate Complexity: [place], [place], [phone] 06/01/2019, 12:10 PM

## 2019-06-01 NOTE — Consult Note (Signed)
Modified Barium Swallow Progress Note  Patient Details  Name: Luke Rivera MRN: [id number] Date of Birth: Nov 10, 1941  Today's Date: 06/01/2019  Modified Barium Swallow completed.  Full report located under Chart Review in the Imaging Section.  Brief recommendations include the following:  Clinical Impression Pt presents with mild oropharyngeal dysphagia characterized by decreased oral propulsion/impaired mastication and a delay to the vallecular space with thin via tsp; delay to pyriform sinus with nectar consistency also noted; straw sips were attempted with thin/nectar, but unsuccessful d/t decreased mentation (ie: biting on straw despite mod verbal cues); no penetration/aspiration noted during study; pt's mentation decreased overall during exam,so initiation of a conservative diet of Dysphagia 2/thin via tsp recommended with ST f/u for diet tolerance and advancement.       Swallow Evaluation Recommendations       SLP Diet Recommendations: Dysphagia 2 (Fine chop) solids;Thin liquid;Other (Comment)(small sips/tsp sips)   Liquid Administration via: No straw;Cup;Other (Comment)(small sips or tsp sips)   Medication Administration: Crushed with puree   Supervision: Staff to assist with self feeding;Full supervision/cueing for compensatory strategies   Compensations: Slow rate;Small sips/bites;Effortful swallow;Multiple dry swallows after each bite/sip   Postural Changes: Seated upright at 90 degrees   Oral Care Recommendations: Oral care BID        Elvina Sidle, M.S., CCC-SLP 06/01/2019,2:48 PM

## 2019-06-01 NOTE — Progress Notes (Addendum)
Spoke with patient's wife.  She states that patient should be Full Code.  Discussed risks and benefits.  She notes that patient would want CPR and intubation and then if prolonged life-saving measures would needed, wife would discuss when the time came.    Will make patient full code.  Wife agreeable to CIR.  Will place consult.  Wife also asking if she or her daughter could pick up his belongings.  Will call nurse to facilitate if possible.  [place] Constable, D.O.  PGY-1 Family Medicine  06/01/2019 1:45 PM

## 2019-06-01 NOTE — Evaluation (Signed)
Speech Language Pathology Evaluation Patient Details Name: Luke Rivera MRN: [id number] DOB: Jul 10, 1941 Today's Date: 06/01/2019 Time: 9211-9417 SLP Time Calculation (min) (ACUTE ONLY): 27 min  Problem List:  Patient Active Problem List   Diagnosis Date Noted  . Incidental pulmonary nodule, > 38mm and < 40mm 06/01/2019  . CVA (cerebral vascular accident) ([place]) 05/31/2019  . 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 Onychomycosis 01/11/2015  . Pain in lower limb 01/11/2015  . Hammer toe of right foot 01/02/2015  . Pain in right foot 01/02/2015   Past Medical History: History reviewed. No pertinent past medical history. Past Surgical History: History reviewed. No pertinent surgical history. HPI:  78 y.o. male presenting on 05/31/19 with garbled speech and right sided deficits. 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 No intracranial hemorrhage. MRI pending   Assessment / Plan / Recommendation Clinical Impression   Pt presents with expressive aphasia characterized by jargon with pt aware of errors with expression during attempted conversational tasks; pt unable to repeat words, but did benefit from melodic intonation therapy with word production; automatic tasks completed including counting and singing familiar songs (ie: Happy Birthday) with mod verbal cues from SLP; pt could follow 2-step commands, but they were approximately 60% accurate with mod visual/verbal cues provided; cognition difficult to assess d/t expressive aphasia.  ST will f/u for linguistic deficits; on-going cognitive assessment as able; thank you for this consult.    SLP Assessment  SLP Visit Diagnosis: Dysphagia, oropharyngeal phase (R13.12);Aphasia  (R47.01)    Follow Up Recommendations  Other (comment)(TBD)    Frequency and Duration min 2x/week         SLP Evaluation Cognition  Overall Cognitive Status: Difficult to assess Arousal/Alertness: Awake/alert Orientation Level: Other (comment)(DTA d/t expressive aphasia)       Comprehension  Auditory Comprehension Overall Auditory Comprehension: Impaired Yes/No Questions: Impaired Basic Biographical Questions: 0-25% accurate Commands: Impaired Two Step Basic Commands: 25-49% accurate Interfering Components: Other (comment)(aphasia) Visual Recognition/Discrimination Discrimination: Not tested Reading Comprehension Reading Status: Not tested    Expression Expression Primary Mode of Expression: Nonverbal - gestures Verbal Expression Overall Verbal Expression: Impaired Initiation: Impaired Automatic Speech: Counting Level of Generative/Spontaneous Verbalization: Word Repetition: Impaired Level of Impairment: Word level Naming: Impairment Responsive: 0-25% accurate Confrontation: Impaired Convergent: 0-24% accurate Divergent: 0-24% accurate Verbal Errors: Perseveration;Aware of errors;Jargon;Confabulation Pragmatics: Unable to assess Interfering Components: Speech intelligibility Effective Techniques: Melodic intonation Non-Verbal Means of Communication: Gestures Written Expression Dominant Hand: Right Written Expression: Not tested   Oral / Motor  Oral Motor/Sensory Function Overall Oral Motor/Sensory Function: Mild impairment Facial ROM: Reduced right Facial Symmetry: Abnormal symmetry right Facial Strength: Reduced right Lingual ROM: Reduced right Lingual Symmetry: Abnormal symmetry right Lingual Strength: Reduced Motor Speech Overall Motor Speech: Other (comment)(DTA) Respiration: Within functional limits Articulation: Impaired Level of Impairment: Word Intelligibility: Intelligibility reduced Word: 0-24% accurate Phrase: 0-24% accurate Sentence:  0-24% accurate Conversation: 0-24% accurate                       Elvina Sidle, M.S., CCC-SLP 06/01/2019, 12:58 PM

## 2019-06-01 NOTE — Progress Notes (Signed)
Rehab Admissions Coordinator Note:  Per PT and OT recommendation, Patient was screened by Jhonnie Garner for appropriateness for an Inpatient Acute Rehab Consult.  At this time, we are recommending Inpatient Rehab consult. AC will contact MD for order.   Jhonnie Garner 06/01/2019, 1:39 PM  I can be reached at [phone].

## 2019-06-01 NOTE — Procedures (Signed)
Echo attempted. Patient being transported to radiology. Will attempt again later.

## 2019-06-01 NOTE — Progress Notes (Signed)
PT Cancellation Note  Patient Details Name: Luke Rivera MRN: [id number] DOB: 1941-01-04   Cancelled Treatment:    Reason Eval/Treat Not Completed: Active bedrest order Will need updated activity orders. Will follow.   Lanney Gins, PT, DPT Supplemental Physical Therapist 06/01/19 8:05 AM Pager: [phone] Office: [phone]

## 2019-06-01 NOTE — Evaluation (Signed)
Physical Therapy Evaluation Patient Details Name: Luke Rivera MRN: [id number] DOB: [birth year] Today's Date: 06/01/2019   History of Present Illness  78 y.o. male presenting with gargled speech and right sided deficits. 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 No intracranial hemorrhage. MRI pending    Clinical Impression  Patient admitted with the above listed diagnosis. Patient unable to provide prior function. Patient requiring general Max A +2 for bed level mobility. Noted increased tone at R UE and LE with poor ability to move R side against gravity. Attempted to stand at bedside with max A +2 with inability to clear buttocks. Able to boost and scoot towards head of bed with Max A +2 - patient does attempt to initiate movement with forward trunk lean. Will currently recommend CIR level therapies at discharge. PT to continue to follow acutely.      Follow Up Recommendations CIR    Equipment Recommendations  Other (comment)(TBD)    Recommendations for Other Services Rehab consult     Precautions / Restrictions Precautions Precautions: Fall Restrictions Weight Bearing Restrictions: No      Mobility  Bed Mobility Overal bed mobility: Needs Assistance Bed Mobility: Supine to Sit;Sit to Supine;Rolling Rolling: Mod assist   Supine to sit: +2 for physical assistance;Mod assist Sit to supine: Mod assist;Max assist;+2 for physical assistance   General bed mobility comments: requires assist for LE and trunk support to come into sitting - up to Mod A for seated balance  Transfers                 General transfer comment: attempted to stand with total A+2 - unable to clear buttocks from bed; regressed to boosting with  lateral scooting with Max/Total A +2  Ambulation/Gait                Stairs            Wheelchair Mobility    Modified Rankin (Stroke Patients Only) Modified Rankin (Stroke Patients Only) Pre-Morbid Rankin Score: No significant disability Modified Rankin: Severe disability     Balance Overall balance assessment: Needs assistance Sitting-balance support: Single extremity supported;Feet supported Sitting balance-Leahy Scale: Poor                                       Pertinent Vitals/Pain Pain Assessment: Faces Faces Pain Scale: Hurts little more Pain Location: generalized Pain Descriptors / Indicators: Aching;Guarding;Moaning Pain Intervention(s): Limited activity within patient's tolerance;Monitored during session;Repositioned    Home Living Family/patient expects to be discharged to:: Private residence Living Arrangements: Spouse/significant other               Additional Comments: unsure as patient with unintelligble speech    Prior Function Level of Independence: Independent with assistive device(s)         Comments: per chart review pt used "walking stick" but did not move around much; has residual memory impairment with problems processing information at a normal pace.     Hand Dominance   Dominant Hand: Right    Extremity/Trunk Assessment   Upper Extremity Assessment Upper Extremity Assessment: Defer to OT evaluation RUE Deficits / Details: increased extensor tone, inattention to RUE,  difficulty following directions due to impaired cognition RUE Sensation: decreased light touch;decreased proprioception RUE Coordination: decreased fine motor;decreased gross motor    Lower Extremity Assessment Lower Extremity Assessment: RLE deficits/detail RLE Deficits / Details: does not attempt to lift against gravity RLE Sensation: decreased light touch;decreased proprioception RLE Coordination: decreased fine motor;decreased gross  motor       Communication   Communication: Expressive difficulties(garbled, unintelligble speech)  Cognition Arousal/Alertness: Lethargic Behavior During Therapy: Flat affect Overall Cognitive Status: Difficult to assess                                        General Comments      Exercises     Assessment/Plan    PT Assessment Patient needs continued PT services  PT Problem List Decreased strength;Decreased activity tolerance;Decreased balance;Decreased mobility;Decreased knowledge of use of DME;Decreased safety awareness       PT Treatment Interventions DME instruction;Gait training;Functional mobility training;Therapeutic activities;Therapeutic exercise;Balance training;Neuromuscular re-education;Patient/family education    PT Goals (Current goals can be found in the Care Plan section)  Acute Rehab PT Goals Patient Stated Goal: none stated PT Goal Formulation: Patient unable to participate in goal setting Time For Goal Achievement: 06/15/19 Potential to Achieve Goals: Fair    Frequency Min 4X/week   Barriers to discharge        Co-evaluation PT/OT/SLP Co-Evaluation/Treatment: Yes Reason for Co-Treatment: Complexity of the patient's impairments (multi-system involvement);Necessary to address cognition/behavior during functional activity;For patient/therapist safety;To address functional/ADL transfers PT goals addressed during session: Mobility/safety with mobility;Balance OT goals addressed during session: ADL's and self-care       AM-PAC PT "6 Clicks" Mobility  Outcome Measure Help needed turning from your back to your side while in a flat bed without using bedrails?: A Lot Help needed moving from lying on your back to sitting on the side of a flat bed without using bedrails?: Total Help needed moving to and from a bed to a chair (including a wheelchair)?: Total Help needed standing up from a chair using your arms (e.g., wheelchair or bedside  chair)?: Total Help needed to walk in hospital room?: Total   6 Click Score: 6    End of Session Equipment Utilized During Treatment: Gait belt;Oxygen Activity Tolerance: Patient tolerated treatment well Patient left: in bed;with call bell/phone within reach;with bed alarm set Nurse Communication: Mobility status PT Visit Diagnosis: Unsteadiness on feet (R26.81);Other abnormalities of gait and mobility (R26.89);Muscle weakness (generalized) (M62.81)    Time: 5638-7564 PT Time Calculation (min) (ACUTE ONLY): 24 min   Charges:   PT Evaluation $PT Eval Moderate Complexity: 1 Mod          Lanney Gins, PT, DPT Supplemental Physical Therapist 06/01/19 11:43 AM Pager: [phone] Office: [phone]

## 2019-06-01 NOTE — Progress Notes (Addendum)
Family Medicine Teaching Service Daily Progress Note Intern Pager: [phone]  Patient name: Luke Rivera Medical record number: [id number] Date of birth: [birth year] Age: 78 y.o. Gender: male  Primary Care Provider: Kristopher Glee., MD Consultants: Neuro Code Status: DNR  Pt Overview and Major Events to Date:  6/22 Admitted to FPTS  Assessment and Plan: Luke Rivera is a 78 y.o. male presenting with gargled speech and right sided deficits. 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  Gargled Speech, Right sided Deficits: CTA head/neck with suspicion for acute left MCA territory infarct, underlying chronic anterior left frontal lobe infarct, age-related cerebral atrophy with mild chronic small vessel ischemic disease.  Also of left ICA 65 to 75% stenosis, right ICA 50% stenosis.  Neurology saw in ED and noted patient not candidate for TPA.  Recommended holding anticoagulation pending MRI.  Plan to follow-up with final neurology recommendations regarding patient's platelet inhibitor, as he has now had 2 neurovascular events while on Plavix.  Consider Brilinta.  Labs pending this AM.  EKG this a.m. without significant changes from yesterday.  Wife called MRI and noted that patient has "bare metal stents." - order CXR and Abd XR to assess for stent  -Neurology following, appreciate recommendations -MRI brain -A1c/lipid panel -Frequent neuro checks -Continue aspirin 325 mg daily -PT/OT/speech -permissive HTN  HTN:  BP this a.m. 151/76.  Home meds: Lasix 40mg  PRN, Lopressor 12.5mg  MWF, losartan 12.5mg  3x/week, Potassium gluconate 99mg  QD - permissive HTN  - hold home meds  T2DM with periopheral neuropathy:  Last A1C 6.9 (2018). Home meds: Glipizide 10mg  BID, Lantus 40U qHS, Metformin 1000mg  qHS, Gabapentin 600mg  BID  - NPO until passes swallow study - sSSI - CBGs q4 hours until passes  swallow study, then with meals - A1c pending - hold all home meds  HLD: Home meds: Atorvastatin 40mg  QD - hold home meds until swallow study  H/o CAD with two DES (2013): History of two DES in 2nd marginal and mid RCA in 03/2012. Home meds: Plavix 75mg  QD - hold home meds until swallow study  H/o unprovoked DVT on Eliquis Home meds: Eliquis 2.5mg  BID  - hold home meds until swallow study - Lovenox for DVT ppx  GERD: Protonix 40mg  QD  Incidental Pulmonary Nodule: Head CTA notable for an indeterminant 37mm left upper lobe nodule with recommendation for a repeat non-contrast chest CT at 6-12 months.  FEN/GI: NPO pending Swallow Study PPx: Lovenox  Disposition: pending work-up, PT/OT  Subjective:  No acute events overnight.  Patient remains aphasic.  Appears comfortable.  Objective: Temp:  [97.5 F (36.4 C)-97.8 F (36.6 C)] 97.8 F (36.6 C) (06/23 0345) Pulse Rate:  [62-85] 67 (06/23 0558) Resp:  [10-12] 12 (06/23 0345) BP: (144-177)/(65-89) 151/76 (06/23 0558) SpO2:  [93 %-98 %] 98 % (06/23 0558) Weight:  [122 kg-125.2 kg] 122 kg (06/22 2321)  Physical Exam:  General: 78 y.o. male in NAD Cardio: RRR no m/r/g Lungs: CTAB, no wheezing, no rhonchi, no crackles, no IWOB on RA Abdomen: Soft, non-tender to palpation, non-distended, positive bowel sounds Skin: warm and dry Extremities: No edema, chronic venous stasis dermatitis Neuro: PERRL, follows commands, aphasia  Laboratory: Recent Labs  Lab 05/31/19 2009 05/31/19 2012  WBC 5.7  --   HGB 12.9* 12.6*  HCT 39.1 37.0*  PLT 269  --    Recent Labs  Lab 05/31/19 2009 05/31/19 2012  NA 140 140  K 3.7 3.6  CL 106 104  CO2 22  --   BUN 11 11  CREATININE 1.08 0.90  CALCIUM 8.9  --   PROT 6.4*  --   BILITOT 1.0  --   ALKPHOS 74  --   ALT 23  --   AST 25  --   GLUCOSE 202* 199*     Imaging/Diagnostic Tests: Ct Angio Head W Or Wo Contrast  Result Date: 05/31/2019 CLINICAL DATA:  Initial  evaluation for possible stroke, right-sided deficits. EXAM: CT ANGIOGRAPHY HEAD AND NECK TECHNIQUE: Multidetector CT imaging of the head and neck was performed using the standard protocol during bolus administration of intravenous contrast. Multiplanar CT image reconstructions and MIPs were obtained to evaluate the vascular anatomy. Carotid stenosis measurements (when applicable) are obtained utilizing NASCET criteria, using the distal internal carotid diameter as the denominator. CONTRAST:  66mL OMNIPAQUE IOHEXOL 350 MG/ML SOLN COMPARISON:  None available. FINDINGS: CT HEAD FINDINGS Brain: Generalized age-related cerebral atrophy with mild chronic small vessel ischemic disease. 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 There is question of subtle evolving hypodensity involving the supra ganglionic cortical gray matter, M5 distribution (series 6, image 43). No other definite acute large vessel territory infarct. No acute intracranial hemorrhage. No mass lesion or midline shift. No hydrocephalus. No extra-axial fluid collection. Vascular: No hyperdense vessel. Calcified atherosclerosis present at skull base. Skull: Scalp soft tissues and calvarium within normal limits. Sinuses/Orbits: Globes and orbital soft tissues within normal limits. Chronic right sphenoid sinus disease. Mucosal thickening noted within the inferior left maxillary sinus. Paranasal sinuses are otherwise clear. Small right mastoid effusion noted. Other: None. ASPECTS [facility] Stroke Program Early CT Score) - Ganglionic level infarction (caudate, lentiform nuclei, internal capsule, insula, M1-M3 cortex): 7 - Supraganglionic infarction (M4-M6 cortex): 2 Total score (0-10 with 10 being normal): 9 Review of the MIP images confirms the above findings CTA NECK FINDINGS Aortic arch: Visualized arch of normal caliber with normal 3 vessel morphology. Mild atheromatous change about  the origin of the great vessels without hemodynamically significant stenosis. Visualized subclavian arteries widely patent. Right carotid system: Right common carotid artery patent from its origin to the bifurcation without stenosis. Scattered mixed plaque about the proximal right ICA with associated stenosis of up to 50% by NASCET criteria. Right ICA otherwise widely patent to the skull base. Left carotid system: Left common carotid artery patent from its origin to the bifurcation without flow-limiting stenosis. Eccentric calcified plaque at the origin of the left ICA with associated stenosis of approximately 65-70% by NASCET criteria. Left ICA otherwise widely patent to the skull base. Vertebral arteries: Both of the vertebral arteries arise from the subclavian arteries. Dominant left vertebral artery widely patent within the neck. 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 Distal reconstitution via muscular branches at the distal right V2 segment. Right vertebral patent distally to the skull base without stenosis or other abnormality. Skeleton: No acute osseous finding. No discrete lytic or blastic osseous lesions. 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 Other neck: No other acute soft tissue abnormality within the neck. Upper chest: Upper lobe predominant emphysema. Superimposed 6 mm left upper lobe nodule (series 5, image 176). Visualized upper chest demonstrates no other acute abnormality. Review of the MIP images confirms the above findings CTA HEAD FINDINGS Anterior circulation: Focal atheromatous plaque within the petrous left ICA with mild stenosis. Petrous right ICA widely patent.  Scattered atheromatous plaque within the cavernous/supraclinoid ICAs with mild to moderate multifocal narrowing (no more than 50%). ICA termini well perfused. A1 segments widely patent bilaterally. Normal anterior communicating artery. Anterior cerebral arteries patent to  their distal aspects without stenosis. M1 segments demonstrate atheromatous irregularity but are patent bilaterally without flow-limiting stenosis. Normal MCA bifurcations. No proximal M2 occlusion. Focal atherosclerotic plaque with associated severe left M2 stenosis noted (series 9, image 151). Distal MCA branches well perfused and symmetric. Posterior circulation: Dominant left vertebral artery patent as it crosses into the cranial vault. Atheromatous plaque within the mid-distal left V4 segment with associated moderate approximate 50% stenosis. Left PICA patent. Hypoplastic right vertebral artery largely terminates in PICA, although a tiny branch ascending towards the vertebrobasilar junction. Right PICA patent as well. Basilar demonstrates multifocal atheromatous irregularity short-segment mild stenosis noted within the mid basilar. Superior cerebral arteries patent proximally. Left PCA supplied via the basilar. Predominant fetal type origin of the right PCA. PCAs demonstrate scattered atheromatous irregularity but are patent to their distal aspects without flow-limiting stenosis. Venous sinuses: Grossly patent allowing for timing of the contrast bolus. Anatomic variants: Fetal type origin of the right PCA. No intracranial aneurysm or other vascular abnormality. Delayed phase: Not performed. Review of the MIP images confirms the above findings IMPRESSION: CT HEAD IMPRESSION: 1. Question subtle evolving hypodensity involving the supra ganglionic mid left frontal lobe, suspicious for acute left MCA territory infarct. No intracranial hemorrhage. 2. Aspects equals 9. 3. Underlying chronic anterior left frontal lobe infarct. 4. Age-related cerebral atrophy with mild chronic small vessel ischemic disease. CTA HEAD AND NECK IMPRESSION: 1. Negative CTA for large vessel occlusion. 2. 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 3. Approximate 50% stenosis about the proximal right ICA. 4. 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 Distal reconstitution at the distal right V2 segment. Dominant left vertebral artery widely patent within the neck. 5. 50% atheromatous stenosis involving the mid-distal left V4 segment. 6. Additional diffuse small vessel atheromatous irregularity throughout the intracranial circulation. 7. Emphysema. 8. 6 mm left upper lobe nodule, indeterminate. Non-contrast chest CT at 6-12 months is recommended. If the nodule is stable at time of repeat CT, then future CT at 18-24 months (from today's scan) is considered optional for low-risk patients, but is recommended for high-risk patients. This recommendation follows the consensus statement: Guidelines for Management of Incidental Pulmonary Nodules Detected on CT Images: From the Fleischner Society 2017; Radiology 2017; 284:228-243. Electronically Signed   By: Jeannine Boga M.D.   On: 05/31/2019 21:24   Ct Angio Neck W Or Wo Contrast  Result Date: 05/31/2019 CLINICAL DATA:  Initial evaluation for possible stroke, right-sided deficits. EXAM: CT ANGIOGRAPHY HEAD AND NECK TECHNIQUE: Multidetector CT imaging of the head and neck was performed using the standard protocol during bolus administration of intravenous contrast. Multiplanar CT image reconstructions and MIPs were obtained to evaluate the vascular anatomy. Carotid stenosis measurements (when applicable) are obtained utilizing NASCET criteria, using the distal internal carotid diameter as the denominator. CONTRAST:  12mL OMNIPAQUE IOHEXOL 350 MG/ML SOLN COMPARISON:  None available. FINDINGS: CT HEAD FINDINGS Brain: Generalized age-related cerebral atrophy with mild chronic small vessel ischemic disease. 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 There is question of subtle evolving hypodensity involving the supra ganglionic cortical gray matter, M5 distribution (series 6, image 43). No other  definite acute large vessel territory infarct. No acute intracranial hemorrhage. No mass lesion  or midline shift. No hydrocephalus. No extra-axial fluid collection. Vascular: No hyperdense vessel. Calcified atherosclerosis present at skull base. Skull: Scalp soft tissues and calvarium within normal limits. Sinuses/Orbits: Globes and orbital soft tissues within normal limits. Chronic right sphenoid sinus disease. Mucosal thickening noted within the inferior left maxillary sinus. Paranasal sinuses are otherwise clear. Small right mastoid effusion noted. Other: None. ASPECTS [facility] Stroke Program Early CT Score) - Ganglionic level infarction (caudate, lentiform nuclei, internal capsule, insula, M1-M3 cortex): 7 - Supraganglionic infarction (M4-M6 cortex): 2 Total score (0-10 with 10 being normal): 9 Review of the MIP images confirms the above findings CTA NECK FINDINGS Aortic arch: Visualized arch of normal caliber with normal 3 vessel morphology. Mild atheromatous change about the origin of the great vessels without hemodynamically significant stenosis. Visualized subclavian arteries widely patent. Right carotid system: Right common carotid artery patent from its origin to the bifurcation without stenosis. Scattered mixed plaque about the proximal right ICA with associated stenosis of up to 50% by NASCET criteria. Right ICA otherwise widely patent to the skull base. Left carotid system: Left common carotid artery patent from its origin to the bifurcation without flow-limiting stenosis. Eccentric calcified plaque at the origin of the left ICA with associated stenosis of approximately 65-70% by NASCET criteria. Left ICA otherwise widely patent to the skull base. Vertebral arteries: Both of the vertebral arteries arise from the subclavian arteries. Dominant left vertebral artery widely patent within the neck. 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 Distal reconstitution via muscular  branches at the distal right V2 segment. Right vertebral patent distally to the skull base without stenosis or other abnormality. Skeleton: No acute osseous finding. No discrete lytic or blastic osseous lesions. 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 Other neck: No other acute soft tissue abnormality within the neck. Upper chest: Upper lobe predominant emphysema. Superimposed 6 mm left upper lobe nodule (series 5, image 176). Visualized upper chest demonstrates no other acute abnormality. Review of the MIP images confirms the above findings CTA HEAD FINDINGS Anterior circulation: Focal atheromatous plaque within the petrous left ICA with mild stenosis. Petrous right ICA widely patent. Scattered atheromatous plaque within the cavernous/supraclinoid ICAs with mild to moderate multifocal narrowing (no more than 50%). ICA termini well perfused. A1 segments widely patent bilaterally. Normal anterior communicating artery. Anterior cerebral arteries patent to their distal aspects without stenosis. M1 segments demonstrate atheromatous irregularity but are patent bilaterally without flow-limiting stenosis. Normal MCA bifurcations. No proximal M2 occlusion. Focal atherosclerotic plaque with associated severe left M2 stenosis noted (series 9, image 151). Distal MCA branches well perfused and symmetric. Posterior circulation: Dominant left vertebral artery patent as it crosses into the cranial vault. Atheromatous plaque within the mid-distal left V4 segment with associated moderate approximate 50% stenosis. Left PICA patent. Hypoplastic right vertebral artery largely terminates in PICA, although a tiny branch ascending towards the vertebrobasilar junction. Right PICA patent as well. Basilar demonstrates multifocal atheromatous irregularity short-segment mild stenosis noted within the mid basilar. Superior cerebral arteries patent proximally. Left PCA supplied via the basilar. Predominant fetal type  origin of the right PCA. PCAs demonstrate scattered atheromatous irregularity but are patent to their distal aspects without flow-limiting stenosis. Venous sinuses: Grossly patent allowing for timing of the contrast bolus. Anatomic variants: Fetal type origin of the right PCA. No intracranial aneurysm or other vascular abnormality. Delayed phase: Not performed. Review of the MIP images confirms the above findings IMPRESSION: CT HEAD IMPRESSION: 1. Question subtle evolving hypodensity  involving the supra ganglionic mid left frontal lobe, suspicious for acute left MCA territory infarct. No intracranial hemorrhage. 2. Aspects equals 9. 3. Underlying chronic anterior left frontal lobe infarct. 4. Age-related cerebral atrophy with mild chronic small vessel ischemic disease. CTA HEAD AND NECK IMPRESSION: 1. Negative CTA for large vessel occlusion. 2. 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 3. Approximate 50% stenosis about the proximal right ICA. 4. 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 Distal reconstitution at the distal right V2 segment. Dominant left vertebral artery widely patent within the neck. 5. 50% atheromatous stenosis involving the mid-distal left V4 segment. 6. Additional diffuse small vessel atheromatous irregularity throughout the intracranial circulation. 7. Emphysema. 8. 6 mm left upper lobe nodule, indeterminate. Non-contrast chest CT at 6-12 months is recommended. If the nodule is stable at time of repeat CT, then future CT at 18-24 months (from today's scan) is considered optional for low-risk patients, but is recommended for high-risk patients. This recommendation follows the consensus statement: Guidelines for Management of Incidental Pulmonary Nodules Detected on CT Images: From the Fleischner Society 2017; Radiology 2017; 284:228-243. Electronically Signed   By: Jeannine Boga M.D.   On: 05/31/2019 21:24   Ct Head Code Stroke Wo Contrast  Result  Date: 05/31/2019 CLINICAL DATA:  Initial evaluation for possible stroke, right-sided deficits. EXAM: CT ANGIOGRAPHY HEAD AND NECK TECHNIQUE: Multidetector CT imaging of the head and neck was performed using the standard protocol during bolus administration of intravenous contrast. Multiplanar CT image reconstructions and MIPs were obtained to evaluate the vascular anatomy. Carotid stenosis measurements (when applicable) are obtained utilizing NASCET criteria, using the distal internal carotid diameter as the denominator. CONTRAST:  51mL OMNIPAQUE IOHEXOL 350 MG/ML SOLN COMPARISON:  None available. FINDINGS: CT HEAD FINDINGS Brain: Generalized age-related cerebral atrophy with mild chronic small vessel ischemic disease. 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 There is question of subtle evolving hypodensity involving the supra ganglionic cortical gray matter, M5 distribution (series 6, image 43). No other definite acute large vessel territory infarct. No acute intracranial hemorrhage. No mass lesion or midline shift. No hydrocephalus. No extra-axial fluid collection. Vascular: No hyperdense vessel. Calcified atherosclerosis present at skull base. Skull: Scalp soft tissues and calvarium within normal limits. Sinuses/Orbits: Globes and orbital soft tissues within normal limits. Chronic right sphenoid sinus disease. Mucosal thickening noted within the inferior left maxillary sinus. Paranasal sinuses are otherwise clear. Small right mastoid effusion noted. Other: None. ASPECTS [facility] Stroke Program Early CT Score) - Ganglionic level infarction (caudate, lentiform nuclei, internal capsule, insula, M1-M3 cortex): 7 - Supraganglionic infarction (M4-M6 cortex): 2 Total score (0-10 with 10 being normal): 9 Review of the MIP images confirms the above findings CTA NECK FINDINGS Aortic arch: Visualized arch of normal caliber with normal 3 vessel  morphology. Mild atheromatous change about the origin of the great vessels without hemodynamically significant stenosis. Visualized subclavian arteries widely patent. Right carotid system: Right common carotid artery patent from its origin to the bifurcation without stenosis. Scattered mixed plaque about the proximal right ICA with associated stenosis of up to 50% by NASCET criteria. Right ICA otherwise widely patent to the skull base. Left carotid system: Left common carotid artery patent from its origin to the bifurcation without flow-limiting stenosis. Eccentric calcified plaque at the origin of the left ICA with associated stenosis of approximately 65-70% by NASCET criteria. Left ICA otherwise widely patent to the skull base. Vertebral arteries: Both  of the vertebral arteries arise from the subclavian arteries. Dominant left vertebral artery widely patent within the neck. 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 Distal reconstitution via muscular branches at the distal right V2 segment. Right vertebral patent distally to the skull base without stenosis or other abnormality. Skeleton: No acute osseous finding. No discrete lytic or blastic osseous lesions. 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 Other neck: No other acute soft tissue abnormality within the neck. Upper chest: Upper lobe predominant emphysema. Superimposed 6 mm left upper lobe nodule (series 5, image 176). Visualized upper chest demonstrates no other acute abnormality. Review of the MIP images confirms the above findings CTA HEAD FINDINGS Anterior circulation: Focal atheromatous plaque within the petrous left ICA with mild stenosis. Petrous right ICA widely patent. Scattered atheromatous plaque within the cavernous/supraclinoid ICAs with mild to moderate multifocal narrowing (no more than 50%). ICA termini well perfused. A1 segments widely patent bilaterally. Normal anterior communicating  artery. Anterior cerebral arteries patent to their distal aspects without stenosis. M1 segments demonstrate atheromatous irregularity but are patent bilaterally without flow-limiting stenosis. Normal MCA bifurcations. No proximal M2 occlusion. Focal atherosclerotic plaque with associated severe left M2 stenosis noted (series 9, image 151). Distal MCA branches well perfused and symmetric. Posterior circulation: Dominant left vertebral artery patent as it crosses into the cranial vault. Atheromatous plaque within the mid-distal left V4 segment with associated moderate approximate 50% stenosis. Left PICA patent. Hypoplastic right vertebral artery largely terminates in PICA, although a tiny branch ascending towards the vertebrobasilar junction. Right PICA patent as well. Basilar demonstrates multifocal atheromatous irregularity short-segment mild stenosis noted within the mid basilar. Superior cerebral arteries patent proximally. Left PCA supplied via the basilar. Predominant fetal type origin of the right PCA. PCAs demonstrate scattered atheromatous irregularity but are patent to their distal aspects without flow-limiting stenosis. Venous sinuses: Grossly patent allowing for timing of the contrast bolus. Anatomic variants: Fetal type origin of the right PCA. No intracranial aneurysm or other vascular abnormality. Delayed phase: Not performed. Review of the MIP images confirms the above findings IMPRESSION: CT HEAD IMPRESSION: 1. Question subtle evolving hypodensity involving the supra ganglionic mid left frontal lobe, suspicious for acute left MCA territory infarct. No intracranial hemorrhage. 2. Aspects equals 9. 3. Underlying chronic anterior left frontal lobe infarct. 4. Age-related cerebral atrophy with mild chronic small vessel ischemic disease. CTA HEAD AND NECK IMPRESSION: 1. Negative CTA for large vessel occlusion. 2. 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 3. Approximate 50% stenosis about  the proximal right ICA. 4. 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 Distal reconstitution at the distal right V2 segment. Dominant left vertebral artery widely patent within the neck. 5. 50% atheromatous stenosis involving the mid-distal left V4 segment. 6. Additional diffuse small vessel atheromatous irregularity throughout the intracranial circulation. 7. Emphysema. 8. 6 mm left upper lobe nodule, indeterminate. Non-contrast chest CT at 6-12 months is recommended. If the nodule is stable at time of repeat CT, then future CT at 18-24 months (from today's scan) is considered optional for low-risk patients, but is recommended for high-risk patients. This recommendation follows the consensus statement: Guidelines for Management of Incidental Pulmonary Nodules Detected on CT Images: From the [employer]ner Society 2017; Radiology 2017; 284:228-243. Electronically Signed   By: Jeannine Boga M.D.   On: 05/31/2019 21:24    Cleophas Dunker, DO 06/01/2019, 7:16 AM PGY-1, [place] [facility] pager: [phone], text pages welcome

## 2019-06-01 NOTE — Progress Notes (Signed)
STROKE TEAM PROGRESS NOTE  HPI:( Dr Leonel Ramsay ) Luke Rivera is a 78 y.o. male with a history of atrial fibrillation on anticoagulation with Eliquis who is compliant with his medication per his wife who was in his normal state of health around 7 when he went into the next room to talk on the phone.  He continued talking normally until approximately 715 which point his speech changed to a garbled mishmash.  She then noticed that he was having trouble with his right side and therefore called 911.He was brought into the emergency department as a code stroke where CT excluded intracranial hemorrhage and a CTA was performed to assess for candidacy for intra-arterial intervention, but unfortunately there was no target. At baseline, he is able to walk with the help of a walking stick, but does not get around all that much.  This is due to previous back surgeries.  Mentally, he has had some difficulties with his short-term memory but still takes care of his own bills and his own activities of daily living.  LKW: 7:15 PM tpa given?: no, anticoagulated with Eliquis  INTERVAL HISTORY I have personally reviewed history of presenting illness with the patient and reviewed electronic medical records and imaging films in PACS.  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 Fib.  He continues to have significant expressive aphasia and dense right hemiplegia today.  Vitals:   06/01/19 0345 06/01/19 0358 06/01/19 0558 06/01/19 0758  BP:  (!) 145/66 (!) 151/76 (!) 168/75  Pulse: 67 63 67 71  Resp: 12   17  Temp: 97.8 F (36.6 C)   98.1 F (36.7 C)  TempSrc: Oral   Oral  SpO2:  96% 98% 100%  Weight:      Height:        CBC:  Recent Labs  Lab 05/31/19 2009 05/31/19 2012 06/01/19 0736  WBC 5.7  --  6.5  NEUTROABS 3.0  --   --   HGB 12.9* 12.6* 11.8*  HCT 39.1 37.0* 35.6*  MCV 86.3  --  86.8  PLT 269  --  245     Basic Metabolic Panel:  Recent Labs  Lab 05/31/19 2009 05/31/19 2012 06/01/19 0736  NA 140 140 143  K 3.7 3.6 3.6  CL 106 104 108  CO2 22  --  26  GLUCOSE 202* 199* 170*  BUN 11 11 10   CREATININE 1.08 0.90 1.05  CALCIUM 8.9  --  8.8*   Lipid Panel:     Component Value Date/Time   CHOL 107 06/01/2019 0736   TRIG 128 06/01/2019 0736   HDL 32 (L) 06/01/2019 0736   CHOLHDL 3.3 06/01/2019 0736   VLDL 26 06/01/2019 0736   LDLCALC 49 06/01/2019 0736   HgbA1c:  Lab Results  Component Value Date   HGBA1C 7.3 (H) 06/01/2019   Urine Drug Screen:     Component Value Date/Time   LABOPIA NONE DETECTED 06/01/2019 0155   COCAINSCRNUR NONE DETECTED 06/01/2019 0155   LABBENZ NONE DETECTED 06/01/2019 0155   AMPHETMU NONE DETECTED 06/01/2019 0155   THCU NONE DETECTED 06/01/2019 0155   LABBARB NONE DETECTED 06/01/2019 0155    Alcohol Level     Component Value Date/Time   ETH <10 05/31/2019 2009    IMAGING Ct Angio Head W Or Wo Contrast  Result Date: 05/31/2019 CLINICAL DATA:  Initial evaluation for possible stroke, right-sided deficits. EXAM: CT  ANGIOGRAPHY HEAD AND NECK TECHNIQUE: Multidetector CT imaging of the head and neck was performed using the standard protocol during bolus administration of intravenous contrast. Multiplanar CT image reconstructions and MIPs were obtained to evaluate the vascular anatomy. Carotid stenosis measurements (when applicable) are obtained utilizing NASCET criteria, using the distal internal carotid diameter as the denominator. CONTRAST:  23mL OMNIPAQUE IOHEXOL 350 MG/ML SOLN COMPARISON:  None available. FINDINGS: CT HEAD FINDINGS Brain: Generalized age-related cerebral atrophy with mild chronic small vessel ischemic disease. 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 There is question of subtle evolving hypodensity involving the supra ganglionic cortical gray matter, M5  distribution (series 6, image 43). No other definite acute large vessel territory infarct. No acute intracranial hemorrhage. No mass lesion or midline shift. No hydrocephalus. No extra-axial fluid collection. Vascular: No hyperdense vessel. Calcified atherosclerosis present at skull base. Skull: Scalp soft tissues and calvarium within normal limits. Sinuses/Orbits: Globes and orbital soft tissues within normal limits. Chronic right sphenoid sinus disease. Mucosal thickening noted within the inferior left maxillary sinus. Paranasal sinuses are otherwise clear. Small right mastoid effusion noted. Other: None. ASPECTS [facility] Stroke Program Early CT Score) - Ganglionic level infarction (caudate, lentiform nuclei, internal capsule, insula, M1-M3 cortex): 7 - Supraganglionic infarction (M4-M6 cortex): 2 Total score (0-10 with 10 being normal): 9 Review of the MIP images confirms the above findings CTA NECK FINDINGS Aortic arch: Visualized arch of normal caliber with normal 3 vessel morphology. Mild atheromatous change about the origin of the great vessels without hemodynamically significant stenosis. Visualized subclavian arteries widely patent. Right carotid system: Right common carotid artery patent from its origin to the bifurcation without stenosis. Scattered mixed plaque about the proximal right ICA with associated stenosis of up to 50% by NASCET criteria. Right ICA otherwise widely patent to the skull base. Left carotid system: Left common carotid artery patent from its origin to the bifurcation without flow-limiting stenosis. Eccentric calcified plaque at the origin of the left ICA with associated stenosis of approximately 65-70% by NASCET criteria. Left ICA otherwise widely patent to the skull base. Vertebral arteries: Both of the vertebral arteries arise from the subclavian arteries. Dominant left vertebral artery widely patent within the neck. 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 Distal reconstitution via muscular branches at the distal right V2 segment. Right vertebral patent distally to the skull base without stenosis or other abnormality. Skeleton: No acute osseous finding. No discrete lytic or blastic osseous lesions. 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 Other neck: No other acute soft tissue abnormality within the neck. Upper chest: Upper lobe predominant emphysema. Superimposed 6 mm left upper lobe nodule (series 5, image 176). Visualized upper chest demonstrates no other acute abnormality. Review of the MIP images confirms the above findings CTA HEAD FINDINGS Anterior circulation: Focal atheromatous plaque within the petrous left ICA with mild stenosis. Petrous right ICA widely patent. Scattered atheromatous plaque within the cavernous/supraclinoid ICAs with mild to moderate multifocal narrowing (no more than 50%). ICA termini well perfused. A1 segments widely patent bilaterally. Normal anterior communicating artery. Anterior cerebral arteries patent to their distal aspects without stenosis. M1 segments demonstrate atheromatous irregularity but are patent bilaterally without flow-limiting stenosis. Normal MCA bifurcations. No proximal M2 occlusion. Focal atherosclerotic plaque with associated severe left M2 stenosis noted (series 9, image 151). Distal MCA branches well perfused and symmetric. Posterior circulation: Dominant left vertebral artery patent as it crosses into the cranial vault.  Atheromatous plaque within the mid-distal left V4 segment with associated moderate approximate 50% stenosis. Left PICA patent. Hypoplastic right vertebral artery largely terminates in PICA, although a tiny branch ascending towards the vertebrobasilar junction. Right PICA patent as well. Basilar demonstrates multifocal atheromatous irregularity short-segment mild stenosis noted within the mid basilar. Superior cerebral arteries patent proximally. Left PCA supplied  via the basilar. Predominant fetal type origin of the right PCA. PCAs demonstrate scattered atheromatous irregularity but are patent to their distal aspects without flow-limiting stenosis. Venous sinuses: Grossly patent allowing for timing of the contrast bolus. Anatomic variants: Fetal type origin of the right PCA. No intracranial aneurysm or other vascular abnormality. Delayed phase: Not performed. Review of the MIP images confirms the above findings IMPRESSION: CT HEAD IMPRESSION: 1. Question subtle evolving hypodensity involving the supra ganglionic mid left frontal lobe, suspicious for acute left MCA territory infarct. No intracranial hemorrhage. 2. Aspects equals 9. 3. Underlying chronic anterior left frontal lobe infarct. 4. Age-related cerebral atrophy with mild chronic small vessel ischemic disease. CTA HEAD AND NECK IMPRESSION: 1. Negative CTA for large vessel occlusion. 2. 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 3. Approximate 50% stenosis about the proximal right ICA. 4. 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 Distal reconstitution at the distal right V2 segment. Dominant left vertebral artery widely patent within the neck. 5. 50% atheromatous stenosis involving the mid-distal left V4 segment. 6. Additional diffuse small vessel atheromatous irregularity throughout the intracranial circulation. 7. Emphysema. 8. 6 mm left upper lobe nodule, indeterminate. Non-contrast chest CT at 6-12 months is recommended. If the nodule is stable at time of repeat CT, then future CT at 18-24 months (from today's scan) is considered optional for low-risk patients, but is recommended for high-risk patients. This recommendation follows the consensus statement: Guidelines for Management of Incidental Pulmonary Nodules Detected on CT Images: From the Fleischner Society 2017; Radiology 2017; 284:228-243. Electronically Signed   By: Jeannine Boga M.D.   On: 05/31/2019 21:24   Dg  Chest 1 View  Result Date: 06/01/2019 CLINICAL DATA:  Altered mental status today. EXAM: CHEST  1 VIEW COMPARISON:  None. FINDINGS: The lungs are clear. Heart size is normal. No pneumothorax or pleural effusion. No acute or focal bony abnormality IMPRESSION: Negative chest. Electronically Signed   By: Inge Rise M.D.   On: 06/01/2019 10:29   Dg Abd 1 View  Result Date: 06/01/2019 CLINICAL DATA:  Altered mental status. EXAM: ABDOMEN - 1 VIEW COMPARISON:  None. FINDINGS: The bowel gas pattern is normal. No radio-opaque calculi or other significant radiographic abnormality are seen. Surgical clips right upper quadrant and spinal fusion are noted. IMPRESSION: Negative exam. Electronically Signed   By: Inge Rise M.D.   On: 06/01/2019 10:34   Ct Angio Neck W Or Wo Contrast  Result Date: 05/31/2019 CLINICAL DATA:  Initial evaluation for possible stroke, right-sided deficits. EXAM: CT ANGIOGRAPHY HEAD AND NECK TECHNIQUE: Multidetector CT imaging of the head and neck was performed using the standard protocol during bolus administration of intravenous contrast. Multiplanar CT image reconstructions and MIPs were obtained to evaluate the vascular anatomy. Carotid stenosis measurements (when applicable) are obtained utilizing NASCET criteria, using the distal internal carotid diameter as the denominator. CONTRAST:  18mL OMNIPAQUE IOHEXOL 350 MG/ML SOLN COMPARISON:  None available. FINDINGS: CT HEAD FINDINGS Brain: Generalized age-related cerebral atrophy with mild chronic small vessel ischemic disease. 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  There is question of subtle evolving hypodensity involving the supra ganglionic cortical gray matter, M5 distribution (series 6, image 43). No other definite acute large vessel territory infarct. No acute intracranial hemorrhage. No mass lesion or midline shift. No hydrocephalus. No  extra-axial fluid collection. Vascular: No hyperdense vessel. Calcified atherosclerosis present at skull base. Skull: Scalp soft tissues and calvarium within normal limits. Sinuses/Orbits: Globes and orbital soft tissues within normal limits. Chronic right sphenoid sinus disease. Mucosal thickening noted within the inferior left maxillary sinus. Paranasal sinuses are otherwise clear. Small right mastoid effusion noted. Other: None. ASPECTS [facility] Stroke Program Early CT Score) - Ganglionic level infarction (caudate, lentiform nuclei, internal capsule, insula, M1-M3 cortex): 7 - Supraganglionic infarction (M4-M6 cortex): 2 Total score (0-10 with 10 being normal): 9 Review of the MIP images confirms the above findings CTA NECK FINDINGS Aortic arch: Visualized arch of normal caliber with normal 3 vessel morphology. Mild atheromatous change about the origin of the great vessels without hemodynamically significant stenosis. Visualized subclavian arteries widely patent. Right carotid system: Right common carotid artery patent from its origin to the bifurcation without stenosis. Scattered mixed plaque about the proximal right ICA with associated stenosis of up to 50% by NASCET criteria. Right ICA otherwise widely patent to the skull base. Left carotid system: Left common carotid artery patent from its origin to the bifurcation without flow-limiting stenosis. Eccentric calcified plaque at the origin of the left ICA with associated stenosis of approximately 65-70% by NASCET criteria. Left ICA otherwise widely patent to the skull base. Vertebral arteries: Both of the vertebral arteries arise from the subclavian arteries. Dominant left vertebral artery widely patent within the neck. 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 Distal reconstitution via muscular branches at the distal right V2 segment. Right vertebral patent distally to the skull base without stenosis or other abnormality.  Skeleton: No acute osseous finding. No discrete lytic or blastic osseous lesions. 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 Other neck: No other acute soft tissue abnormality within the neck. Upper chest: Upper lobe predominant emphysema. Superimposed 6 mm left upper lobe nodule (series 5, image 176). Visualized upper chest demonstrates no other acute abnormality. Review of the MIP images confirms the above findings CTA HEAD FINDINGS Anterior circulation: Focal atheromatous plaque within the petrous left ICA with mild stenosis. Petrous right ICA widely patent. Scattered atheromatous plaque within the cavernous/supraclinoid ICAs with mild to moderate multifocal narrowing (no more than 50%). ICA termini well perfused. A1 segments widely patent bilaterally. Normal anterior communicating artery. Anterior cerebral arteries patent to their distal aspects without stenosis. M1 segments demonstrate atheromatous irregularity but are patent bilaterally without flow-limiting stenosis. Normal MCA bifurcations. No proximal M2 occlusion. Focal atherosclerotic plaque with associated severe left M2 stenosis noted (series 9, image 151). Distal MCA branches well perfused and symmetric. Posterior circulation: Dominant left vertebral artery patent as it crosses into the cranial vault. Atheromatous plaque within the mid-distal left V4 segment with associated moderate approximate 50% stenosis. Left PICA patent. Hypoplastic right vertebral artery largely terminates in PICA, although a tiny branch ascending towards the vertebrobasilar junction. Right PICA patent as well. Basilar demonstrates multifocal atheromatous irregularity short-segment mild stenosis noted within the mid basilar. Superior cerebral arteries patent proximally. Left PCA supplied via the basilar. Predominant fetal type origin of the right PCA. PCAs demonstrate scattered atheromatous irregularity but are patent to their distal aspects without  flow-limiting stenosis. Venous sinuses: Grossly patent allowing for timing of the contrast bolus. Anatomic variants:  Fetal type origin of the right PCA. No intracranial aneurysm or other vascular abnormality. Delayed phase: Not performed. Review of the MIP images confirms the above findings IMPRESSION: CT HEAD IMPRESSION: 1. Question subtle evolving hypodensity involving the supra ganglionic mid left frontal lobe, suspicious for acute left MCA territory infarct. No intracranial hemorrhage. 2. Aspects equals 9. 3. Underlying chronic anterior left frontal lobe infarct. 4. Age-related cerebral atrophy with mild chronic small vessel ischemic disease. CTA HEAD AND NECK IMPRESSION: 1. Negative CTA for large vessel occlusion. 2. 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 3. Approximate 50% stenosis about the proximal right ICA. 4. 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 Distal reconstitution at the distal right V2 segment. Dominant left vertebral artery widely patent within the neck. 5. 50% atheromatous stenosis involving the mid-distal left V4 segment. 6. Additional diffuse small vessel atheromatous irregularity throughout the intracranial circulation. 7. Emphysema. 8. 6 mm left upper lobe nodule, indeterminate. Non-contrast chest CT at 6-12 months is recommended. If the nodule is stable at time of repeat CT, then future CT at 18-24 months (from today's scan) is considered optional for low-risk patients, but is recommended for high-risk patients. This recommendation follows the consensus statement: Guidelines for Management of Incidental Pulmonary Nodules Detected on CT Images: From the Fleischner Society 2017; Radiology 2017; 284:228-243. Electronically Signed   By: Jeannine Boga M.D.   On: 05/31/2019 21:24   Ct Head Code Stroke Wo Contrast  Result Date: 05/31/2019 CLINICAL DATA:  Initial evaluation for possible stroke, right-sided deficits. EXAM: CT ANGIOGRAPHY HEAD AND  NECK TECHNIQUE: Multidetector CT imaging of the head and neck was performed using the standard protocol during bolus administration of intravenous contrast. Multiplanar CT image reconstructions and MIPs were obtained to evaluate the vascular anatomy. Carotid stenosis measurements (when applicable) are obtained utilizing NASCET criteria, using the distal internal carotid diameter as the denominator. CONTRAST:  73mL OMNIPAQUE IOHEXOL 350 MG/ML SOLN COMPARISON:  None available. FINDINGS: CT HEAD FINDINGS Brain: Generalized age-related cerebral atrophy with mild chronic small vessel ischemic disease. 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 There is question of subtle evolving hypodensity involving the supra ganglionic cortical gray matter, M5 distribution (series 6, image 43). No other definite acute large vessel territory infarct. No acute intracranial hemorrhage. No mass lesion or midline shift. No hydrocephalus. No extra-axial fluid collection. Vascular: No hyperdense vessel. Calcified atherosclerosis present at skull base. Skull: Scalp soft tissues and calvarium within normal limits. Sinuses/Orbits: Globes and orbital soft tissues within normal limits. Chronic right sphenoid sinus disease. Mucosal thickening noted within the inferior left maxillary sinus. Paranasal sinuses are otherwise clear. Small right mastoid effusion noted. Other: None. ASPECTS [facility] Stroke Program Early CT Score) - Ganglionic level infarction (caudate, lentiform nuclei, internal capsule, insula, M1-M3 cortex): 7 - Supraganglionic infarction (M4-M6 cortex): 2 Total score (0-10 with 10 being normal): 9 Review of the MIP images confirms the above findings CTA NECK FINDINGS Aortic arch: Visualized arch of normal caliber with normal 3 vessel morphology. Mild atheromatous change about the origin of the great vessels without hemodynamically significant stenosis.  Visualized subclavian arteries widely patent. Right carotid system: Right common carotid artery patent from its origin to the bifurcation without stenosis. Scattered mixed plaque about the proximal right ICA with associated stenosis of up to 50% by NASCET criteria. Right ICA otherwise widely patent to the skull base. Left carotid system: Left common carotid artery patent from its origin to  the bifurcation without flow-limiting stenosis. Eccentric calcified plaque at the origin of the left ICA with associated stenosis of approximately 65-70% by NASCET criteria. Left ICA otherwise widely patent to the skull base. Vertebral arteries: Both of the vertebral arteries arise from the subclavian arteries. Dominant left vertebral artery widely patent within the neck. 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 Distal reconstitution via muscular branches at the distal right V2 segment. Right vertebral patent distally to the skull base without stenosis or other abnormality. Skeleton: No acute osseous finding. No discrete lytic or blastic osseous lesions. 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 Other neck: No other acute soft tissue abnormality within the neck. Upper chest: Upper lobe predominant emphysema. Superimposed 6 mm left upper lobe nodule (series 5, image 176). Visualized upper chest demonstrates no other acute abnormality. Review of the MIP images confirms the above findings CTA HEAD FINDINGS Anterior circulation: Focal atheromatous plaque within the petrous left ICA with mild stenosis. Petrous right ICA widely patent. Scattered atheromatous plaque within the cavernous/supraclinoid ICAs with mild to moderate multifocal narrowing (no more than 50%). ICA termini well perfused. A1 segments widely patent bilaterally. Normal anterior communicating artery. Anterior cerebral arteries patent to their distal aspects without stenosis. M1 segments demonstrate atheromatous  irregularity but are patent bilaterally without flow-limiting stenosis. Normal MCA bifurcations. No proximal M2 occlusion. Focal atherosclerotic plaque with associated severe left M2 stenosis noted (series 9, image 151). Distal MCA branches well perfused and symmetric. Posterior circulation: Dominant left vertebral artery patent as it crosses into the cranial vault. Atheromatous plaque within the mid-distal left V4 segment with associated moderate approximate 50% stenosis. Left PICA patent. Hypoplastic right vertebral artery largely terminates in PICA, although a tiny branch ascending towards the vertebrobasilar junction. Right PICA patent as well. Basilar demonstrates multifocal atheromatous irregularity short-segment mild stenosis noted within the mid basilar. Superior cerebral arteries patent proximally. Left PCA supplied via the basilar. Predominant fetal type origin of the right PCA. PCAs demonstrate scattered atheromatous irregularity but are patent to their distal aspects without flow-limiting stenosis. Venous sinuses: Grossly patent allowing for timing of the contrast bolus. Anatomic variants: Fetal type origin of the right PCA. No intracranial aneurysm or other vascular abnormality. Delayed phase: Not performed. Review of the MIP images confirms the above findings IMPRESSION: CT HEAD IMPRESSION: 1. Question subtle evolving hypodensity involving the supra ganglionic mid left frontal lobe, suspicious for acute left MCA territory infarct. No intracranial hemorrhage. 2. Aspects equals 9. 3. Underlying chronic anterior left frontal lobe infarct. 4. Age-related cerebral atrophy with mild chronic small vessel ischemic disease. CTA HEAD AND NECK IMPRESSION: 1. Negative CTA for large vessel occlusion. 2. 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 3. Approximate 50% stenosis about the proximal right ICA. 4. 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 Distal reconstitution at the distal  right V2 segment. Dominant left vertebral artery widely patent within the neck. 5. 50% atheromatous stenosis involving the mid-distal left V4 segment. 6. Additional diffuse small vessel atheromatous irregularity throughout the intracranial circulation. 7. Emphysema. 8. 6 mm left upper lobe nodule, indeterminate. Non-contrast chest CT at 6-12 months is recommended. If the nodule is stable at time of repeat CT, then future CT at 18-24 months (from today's scan) is considered optional for low-risk patients, but is recommended for high-risk patients. This recommendation follows the consensus statement: Guidelines for Management of Incidental Pulmonary Nodules Detected on CT Images: From the [employer] 2017; Radiology 2017; 284:228-243. Electronically  Signed   By: Jeannine Boga M.D.   On: 05/31/2019 21:24    PHYSICAL EXAM Pleasant elderly Caucasian male who is not in distress. . Afebrile. Head is nontraumatic. Neck is supple without bruit.    Cardiac exam no murmur or gallop. Lungs are clear to auscultation. Distal pulses are well felt. Neurological Exam : he is awake alert he has severe expressive aphasia he can speak occasional words but not sentences.  Is able to follow midline and most one-step commands but not complex ones.  There is mild dysarthria.  His left gaze preference but able to look to the right past midline.  Blinks to threat on the left but not the right.  Right lower facial weakness.  Tongue midline.  Motor system exam shows dense right hemiparesis with grade 1-2/5 strength in both upper and lower extremities with hypotonia.  Sensation appears diminished on the right.  Right plantar upgoing left downgoing.  Gait not tested.   :   ASSESSMENT/PLAN Luke Rivera is a 78 y.o. 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  Old left frontal infarct.  Age-related small vessel disease and atrophy.  ASPECTS 9    CTA head & neck no LVO.  L ICA 65 to 70%.  R ICA 50%.  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  Dominant L VA.  Mid distal L V4 50%.  Diffuse small vessel disease anterior circulation.  Emphysema.  6 mm LUL nodule, indeterminate.  MRI  pending  2D Echo pending  Check TCD bubble for PFO. If positive, recommend increasing eliquis dosing to 5 mg bid  LDL 49  HgbA1c 7.3  Lovenox 40 mg sq daily for VTE prophylaxis  clopidogrel 75 mg daily and Eliquis (apixaban) daily prior to admission, now on aspirin 300 mg suppository daily. Recommend to resume Eliquis once MRI confirmed no hmg.   If no embolic source found in hospital, consider loop or 30d monitor to look for AF as possible source of stroke as this would change Eliquis dosing  Therapy recommendations: CIR (baseline uses cane)  Disposition: Pending  No hx AF   Hypertension  Stable . Permissive hypertension (OK if < 220/120) but gradually normalize in 5-7 days . Long-term BP goal normotensive  Hyperlipidemia  Home meds: Lipitor 80  Statin currently on hold due to n.p.o. 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 Secondary to stroke . NPO . Speech on board   Other Stroke Risk Factors  Advanced age  Former cigarette smoker  Obesity, Body mass index is 36.48 kg/m., recommend weight loss, diet and exercise as appropriate   Coronary artery disease s/p stents on Plavix prior to admission  Other  Active Problems  Mild difficulty with short-term memory prior to admission   GERD  Incidental pulmonary nodule, repeat CT chest in 6 to 12 months  Hospital day # 1  I have personally obtained history,examined this patient, reviewed notes, independently viewed imaging studies, participated in medical decision making and plan of care.ROS completed by me personally and pertinent positives fully documented  I have made any additions or clarifications directly to the above note.  He presented with expressive aphasia and right hemiplegia due to left MCA branch infarct etiology indeterminate.  Recommend continue ongoing stroke work-up.  Patient is already on anticoagulation for his DVTs and's likely  continue Eliquis when he is able to swallow safely.  Also spoke to the patient's wife over the phone and answered questions about his care.  Greater than 50% time during this 35-minute visit was spent on counseling and coordination of care about his embolic stroke and discussion about anticoagulation, dysphagia and answering questions  Antony Contras, MD Medical Director [place] Pager: [phone] 06/01/2019 2:24 PM   To contact Stroke Continuity provider, please refer to [email or web address] After hours, contact General Neurology

## 2019-06-01 NOTE — Progress Notes (Signed)
  Echocardiogram 2D Echocardiogram has been performed.  Luke Rivera 06/01/2019, 4:50 PM

## 2019-06-02 ENCOUNTER — Inpatient Hospital Stay (HOSPITAL_COMMUNITY): Payer: Medicare HMO

## 2019-06-02 DIAGNOSIS — I639 Cerebral infarction, unspecified: Secondary | ICD-10-CM

## 2019-06-02 DIAGNOSIS — R911 Solitary pulmonary nodule: Secondary | ICD-10-CM

## 2019-06-02 LAB — BASIC METABOLIC PANEL
Anion gap: 10 (ref 5–15)
BUN: 12 mg/dL (ref 8–23)
CO2: 26 mmol/L (ref 22–32)
Calcium: 8.8 mg/dL — ABNORMAL LOW (ref 8.9–10.3)
Chloride: 106 mmol/L (ref 98–111)
Creatinine, Ser: 1.06 mg/dL (ref 0.61–1.24)
GFR calc Af Amer: >60 mL/min (ref >60)
GFR calc non Af Amer: >60 mL/min (ref >60)
Glucose, Bld: 178 mg/dL — ABNORMAL HIGH (ref 70–99)
Potassium: 3.6 mmol/L (ref 3.5–5.1)
Sodium: 142 mmol/L (ref 135–145)

## 2019-06-02 LAB — GLUCOSE, CAPILLARY
Glucose-Capillary: 154 mg/dL — ABNORMAL HIGH (ref 70–99)
Glucose-Capillary: 162 mg/dL — ABNORMAL HIGH (ref 70–99)
Glucose-Capillary: 165 mg/dL — ABNORMAL HIGH (ref 70–99)
Glucose-Capillary: 169 mg/dL — ABNORMAL HIGH (ref 70–99)
Glucose-Capillary: 174 mg/dL — ABNORMAL HIGH (ref 70–99)
Glucose-Capillary: 182 mg/dL — ABNORMAL HIGH (ref 70–99)
Glucose-Capillary: 220 mg/dL — ABNORMAL HIGH (ref 70–99)

## 2019-06-02 LAB — CBC
HCT: 37.9 % — ABNORMAL LOW (ref 39.0–52.0)
Hemoglobin: 12.6 g/dL — ABNORMAL LOW (ref 13.0–17.0)
MCH: 29 pg (ref 26.0–34.0)
MCHC: 33.2 g/dL (ref 30.0–36.0)
MCV: 87.3 fL (ref 80.0–100.0)
Platelets: 240 10*3/uL (ref 150–400)
RBC: 4.34 MIL/uL (ref 4.22–5.81)
RDW: 15.8 % — ABNORMAL HIGH (ref 11.5–15.5)
WBC: 7.6 10*3/uL (ref 4.0–10.5)
nRBC: 0 % (ref 0.0–0.2)

## 2019-06-02 MED ORDER — INSULIN ASPART 100 UNIT/ML ~~LOC~~ SOLN
0.0000 [IU] | Freq: Three times a day (TID) | SUBCUTANEOUS | Status: DC
  Administered 2019-06-02: 3 [IU] via SUBCUTANEOUS
  Administered 2019-06-02 – 2019-06-04 (×5): 2 [IU] via SUBCUTANEOUS
  Administered 2019-06-04: 3 [IU] via SUBCUTANEOUS
  Administered 2019-06-05: 2 [IU] via SUBCUTANEOUS
  Administered 2019-06-05: 5 [IU] via SUBCUTANEOUS
  Administered 2019-06-05 – 2019-06-06 (×2): 3 [IU] via SUBCUTANEOUS
  Administered 2019-06-06: 2 [IU] via SUBCUTANEOUS
  Administered 2019-06-06 – 2019-06-07 (×2): 5 [IU] via SUBCUTANEOUS
  Administered 2019-06-07: 3 [IU] via SUBCUTANEOUS

## 2019-06-02 MED ORDER — ATORVASTATIN CALCIUM 40 MG PO TABS
40.0000 mg | ORAL_TABLET | Freq: Every day | ORAL | Status: DC
  Administered 2019-06-02 – 2019-06-06 (×5): 40 mg via ORAL
  Filled 2019-06-02 (×5): qty 1

## 2019-06-02 MED ORDER — PANTOPRAZOLE SODIUM 40 MG PO TBEC
40.0000 mg | DELAYED_RELEASE_TABLET | Freq: Every day | ORAL | Status: DC
  Administered 2019-06-02 – 2019-06-07 (×6): 40 mg via ORAL
  Filled 2019-06-02 (×6): qty 1

## 2019-06-02 MED ORDER — APIXABAN 5 MG PO TABS
5.0000 mg | ORAL_TABLET | Freq: Two times a day (BID) | ORAL | Status: DC
  Administered 2019-06-02 – 2019-06-07 (×11): 5 mg via ORAL
  Filled 2019-06-02 (×11): qty 1

## 2019-06-02 MED ORDER — CLOPIDOGREL BISULFATE 75 MG PO TABS: 75.0000 mg | ORAL_TABLET | Freq: Every day | ORAL | Status: DC

## 2019-06-02 NOTE — Progress Notes (Signed)
Inpatient Rehab Admissions Coordinator:   Attempted to meet with pt today at 1330, but was sleeping soundly.  Spoke with Dr. Leonie Man regarding good candidate with good family support. Will attempt to f/u tomorrow.  Shann Medal, PT, DPT Admissions Coordinator [phone] 06/02/19  4:54 PM

## 2019-06-02 NOTE — Progress Notes (Signed)
Wife called and updated on patient.  She would like to be contacted by neurology.  Wife told that she will be contacted daily by our team in late morning-early afternoon.  Will let nurse know that patient's wife would like to speak to neurology.  [place] Constable, D.O.  PGY-1 Family Medicine  06/02/2019 12:10 PM

## 2019-06-02 NOTE — Progress Notes (Signed)
PT Cancellation Note  Patient Details Name: Luke Rivera MRN: [id number] DOB: 03/12/1941   Cancelled Treatment:    Reason Eval/Treat Not Completed: Patient at procedure or test/unavailable Will continue to follow.   Lanney Gins, PT, DPT Supplemental Physical Therapist 06/02/19 2:46 PM Pager: [phone] Office: [phone]

## 2019-06-02 NOTE — Progress Notes (Signed)
Wife called and updated on change in Eliquis dosing.  Answered questions that patient's wife had about his overall care.  Plan to call on 6/25 in late morning/early afternoon.  [place] Constable, D.O.  PGY-1 Family Medicine  06/02/2019 3:54 PM

## 2019-06-02 NOTE — Progress Notes (Signed)
STROKE TEAM PROGRESS NOTE       INTERVAL HISTORY   He continues to have significant expressive aphasia and dense right hemiplegia today.  I spoke to his wife who informed me that he did have a possible cardiac arrhythmia a few years ago any oral Holter monitor for a few weeks.  She is not sure whether Eliquis was started after this episode or not.  I personally performed transcranial Doppler bubble study at the bedside and there was no evidence of right-to-left shunt noted.  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  There is also chronic left frontal infarct. Vitals:   06/01/19 2357 06/02/19 0406 06/02/19 0809 06/02/19 1210  BP: (!) 154/66 (!) 145/64 127/66 138/62  Pulse: 78 96 65 63  Resp: 19 19 18 18   Temp: 98.4 F (36.9 C) 99.8 F (37.7 C) 98.4 F (36.9 C) 98.2 F (36.8 C)  TempSrc: Oral Axillary Oral Oral  SpO2: 96% 96% 97% 97%  Weight:      Height:        CBC:  Recent Labs  Lab 05/31/19 2009  06/01/19 0736 06/02/19 0456  WBC 5.7  --  6.5 7.6  NEUTROABS 3.0  --   --   --   HGB 12.9*   < > 11.8* 12.6*  HCT 39.1   < > 35.6* 37.9*  MCV 86.3  --  86.8 87.3  PLT 269  --  245 240   < > = values in this interval not displayed.    Basic Metabolic Panel:  Recent Labs  Lab 06/01/19 0736 06/02/19 0456  NA 143 142  K 3.6 3.6  CL 108 106  CO2 26 26  GLUCOSE 170* 178*  BUN 10 12  CREATININE 1.05 1.06  CALCIUM 8.8* 8.8*   Lipid Panel:     Component Value Date/Time   CHOL 107 06/01/2019 0736   TRIG 128 06/01/2019 0736   HDL 32 (L) 06/01/2019 0736   CHOLHDL 3.3 06/01/2019 0736   VLDL 26 06/01/2019 0736   LDLCALC 49 06/01/2019 0736   HgbA1c:  Lab Results  Component Value Date   HGBA1C 7.3 (H) 06/01/2019   Urine Drug Screen:     Component Value Date/Time   LABOPIA NONE DETECTED 06/01/2019 0155   COCAINSCRNUR NONE DETECTED 06/01/2019 0155   LABBENZ NONE DETECTED 06/01/2019 0155   AMPHETMU NONE DETECTED  06/01/2019 0155   THCU NONE DETECTED 06/01/2019 0155   LABBARB NONE DETECTED 06/01/2019 0155    Alcohol Level     Component Value Date/Time   ETH <10 05/31/2019 2009    IMAGING Ct Angio Head W Or Wo Contrast  Result Date: 05/31/2019 CLINICAL DATA:  Initial evaluation for possible stroke, right-sided deficits. EXAM: CT ANGIOGRAPHY HEAD AND NECK TECHNIQUE: Multidetector CT imaging of the head and neck was performed using the standard protocol during bolus administration of intravenous contrast. Multiplanar CT image reconstructions and MIPs were obtained to evaluate the vascular anatomy. Carotid stenosis measurements (when applicable) are obtained utilizing NASCET criteria, using the distal internal carotid diameter as the denominator. CONTRAST:  68mL OMNIPAQUE IOHEXOL 350 MG/ML SOLN COMPARISON:  None available. FINDINGS: CT HEAD FINDINGS Brain: Generalized age-related cerebral atrophy with mild chronic small vessel ischemic disease. 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 There is question of subtle evolving hypodensity involving the supra ganglionic cortical gray matter, M5 distribution (series 6, image 43). No other definite acute  large vessel territory infarct. No acute intracranial hemorrhage. No mass lesion or midline shift. No hydrocephalus. No extra-axial fluid collection. Vascular: No hyperdense vessel. Calcified atherosclerosis present at skull base. Skull: Scalp soft tissues and calvarium within normal limits. Sinuses/Orbits: Globes and orbital soft tissues within normal limits. Chronic right sphenoid sinus disease. Mucosal thickening noted within the inferior left maxillary sinus. Paranasal sinuses are otherwise clear. Small right mastoid effusion noted. Other: None. ASPECTS [facility] Stroke Program Early CT Score) - Ganglionic level infarction (caudate, lentiform nuclei, internal capsule, insula, M1-M3 cortex): 7 -  Supraganglionic infarction (M4-M6 cortex): 2 Total score (0-10 with 10 being normal): 9 Review of the MIP images confirms the above findings CTA NECK FINDINGS Aortic arch: Visualized arch of normal caliber with normal 3 vessel morphology. Mild atheromatous change about the origin of the great vessels without hemodynamically significant stenosis. Visualized subclavian arteries widely patent. Right carotid system: Right common carotid artery patent from its origin to the bifurcation without stenosis. Scattered mixed plaque about the proximal right ICA with associated stenosis of up to 50% by NASCET criteria. Right ICA otherwise widely patent to the skull base. Left carotid system: Left common carotid artery patent from its origin to the bifurcation without flow-limiting stenosis. Eccentric calcified plaque at the origin of the left ICA with associated stenosis of approximately 65-70% by NASCET criteria. Left ICA otherwise widely patent to the skull base. Vertebral arteries: Both of the vertebral arteries arise from the subclavian arteries. Dominant left vertebral artery widely patent within the neck. 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 Distal reconstitution via muscular branches at the distal right V2 segment. Right vertebral patent distally to the skull base without stenosis or other abnormality. Skeleton: No acute osseous finding. No discrete lytic or blastic osseous lesions. 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 Other neck: No other acute soft tissue abnormality within the neck. Upper chest: Upper lobe predominant emphysema. Superimposed 6 mm left upper lobe nodule (series 5, image 176). Visualized upper chest demonstrates no other acute abnormality. Review of the MIP images confirms the above findings CTA HEAD FINDINGS Anterior circulation: Focal atheromatous plaque within the petrous left ICA with mild stenosis. Petrous right ICA widely patent.  Scattered atheromatous plaque within the cavernous/supraclinoid ICAs with mild to moderate multifocal narrowing (no more than 50%). ICA termini well perfused. A1 segments widely patent bilaterally. Normal anterior communicating artery. Anterior cerebral arteries patent to their distal aspects without stenosis. M1 segments demonstrate atheromatous irregularity but are patent bilaterally without flow-limiting stenosis. Normal MCA bifurcations. No proximal M2 occlusion. Focal atherosclerotic plaque with associated severe left M2 stenosis noted (series 9, image 151). Distal MCA branches well perfused and symmetric. Posterior circulation: Dominant left vertebral artery patent as it crosses into the cranial vault. Atheromatous plaque within the mid-distal left V4 segment with associated moderate approximate 50% stenosis. Left PICA patent. Hypoplastic right vertebral artery largely terminates in PICA, although a tiny branch ascending towards the vertebrobasilar junction. Right PICA patent as well. Basilar demonstrates multifocal atheromatous irregularity short-segment mild stenosis noted within the mid basilar. Superior cerebral arteries patent proximally. Left PCA supplied via the basilar. Predominant fetal type origin of the right PCA. PCAs demonstrate scattered atheromatous irregularity but are patent to their distal aspects without flow-limiting stenosis. Venous sinuses: Grossly patent allowing for timing of the contrast bolus. Anatomic variants: Fetal type origin of the right PCA. No intracranial aneurysm or other vascular abnormality. Delayed phase: Not performed. Review of the MIP images confirms the  above findings IMPRESSION: CT HEAD IMPRESSION: 1. Question subtle evolving hypodensity involving the supra ganglionic mid left frontal lobe, suspicious for acute left MCA territory infarct. No intracranial hemorrhage. 2. Aspects equals 9. 3. Underlying chronic anterior left frontal lobe infarct. 4. Age-related cerebral  atrophy with mild chronic small vessel ischemic disease. CTA HEAD AND NECK IMPRESSION: 1. Negative CTA for large vessel occlusion. 2. 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 3. Approximate 50% stenosis about the proximal right ICA. 4. 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 Distal reconstitution at the distal right V2 segment. Dominant left vertebral artery widely patent within the neck. 5. 50% atheromatous stenosis involving the mid-distal left V4 segment. 6. Additional diffuse small vessel atheromatous irregularity throughout the intracranial circulation. 7. Emphysema. 8. 6 mm left upper lobe nodule, indeterminate. Non-contrast chest CT at 6-12 months is recommended. If the nodule is stable at time of repeat CT, then future CT at 18-24 months (from today's scan) is considered optional for low-risk patients, but is recommended for high-risk patients. This recommendation follows the consensus statement: Guidelines for Management of Incidental Pulmonary Nodules Detected on CT Images: From the Fleischner Society 2017; Radiology 2017; 284:228-243. Electronically Signed   By: Jeannine Boga M.D.   On: 05/31/2019 21:24   Dg Chest 1 View  Result Date: 06/01/2019 CLINICAL DATA:  Altered mental status today. EXAM: CHEST  1 VIEW COMPARISON:  None. FINDINGS: The lungs are clear. Heart size is normal. No pneumothorax or pleural effusion. No acute or focal bony abnormality IMPRESSION: Negative chest. Electronically Signed   By: Inge Rise M.D.   On: 06/01/2019 10:29   Dg Abd 1 View  Result Date: 06/01/2019 CLINICAL DATA:  Altered mental status. EXAM: ABDOMEN - 1 VIEW COMPARISON:  None. FINDINGS: The bowel gas pattern is normal. No radio-opaque calculi or other significant radiographic abnormality are seen. Surgical clips right upper quadrant and spinal fusion are noted. IMPRESSION: Negative exam. Electronically Signed   By: Inge Rise M.D.   On: 06/01/2019 10:34    Ct Angio Neck W Or Wo Contrast  Result Date: 05/31/2019 CLINICAL DATA:  Initial evaluation for possible stroke, right-sided deficits. EXAM: CT ANGIOGRAPHY HEAD AND NECK TECHNIQUE: Multidetector CT imaging of the head and neck was performed using the standard protocol during bolus administration of intravenous contrast. Multiplanar CT image reconstructions and MIPs were obtained to evaluate the vascular anatomy. Carotid stenosis measurements (when applicable) are obtained utilizing NASCET criteria, using the distal internal carotid diameter as the denominator. CONTRAST:  11mL OMNIPAQUE IOHEXOL 350 MG/ML SOLN COMPARISON:  None available. FINDINGS: CT HEAD FINDINGS Brain: Generalized age-related cerebral atrophy with mild chronic small vessel ischemic disease. 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 There is question of subtle evolving hypodensity involving the supra ganglionic cortical gray matter, M5 distribution (series 6, image 43). No other definite acute large vessel territory infarct. No acute intracranial hemorrhage. No mass lesion or midline shift. No hydrocephalus. No extra-axial fluid collection. Vascular: No hyperdense vessel. Calcified atherosclerosis present at skull base. Skull: Scalp soft tissues and calvarium within normal limits. Sinuses/Orbits: Globes and orbital soft tissues within normal limits. Chronic right sphenoid sinus disease. Mucosal thickening noted within the inferior left maxillary sinus. Paranasal sinuses are otherwise clear. Small right mastoid effusion noted. Other: None. ASPECTS [facility] Stroke Program Early CT Score) - Ganglionic level infarction (caudate, lentiform nuclei, internal capsule, insula, M1-M3 cortex): 7 - Supraganglionic infarction (M4-M6 cortex): 2  Total score (0-10 with 10 being normal): 9 Review of the MIP images confirms the above findings CTA NECK FINDINGS Aortic arch: Visualized  arch of normal caliber with normal 3 vessel morphology. Mild atheromatous change about the origin of the great vessels without hemodynamically significant stenosis. Visualized subclavian arteries widely patent. Right carotid system: Right common carotid artery patent from its origin to the bifurcation without stenosis. Scattered mixed plaque about the proximal right ICA with associated stenosis of up to 50% by NASCET criteria. Right ICA otherwise widely patent to the skull base. Left carotid system: Left common carotid artery patent from its origin to the bifurcation without flow-limiting stenosis. Eccentric calcified plaque at the origin of the left ICA with associated stenosis of approximately 65-70% by NASCET criteria. Left ICA otherwise widely patent to the skull base. Vertebral arteries: Both of the vertebral arteries arise from the subclavian arteries. Dominant left vertebral artery widely patent within the neck. 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 Distal reconstitution via muscular branches at the distal right V2 segment. Right vertebral patent distally to the skull base without stenosis or other abnormality. Skeleton: No acute osseous finding. No discrete lytic or blastic osseous lesions. 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 Other neck: No other acute soft tissue abnormality within the neck. Upper chest: Upper lobe predominant emphysema. Superimposed 6 mm left upper lobe nodule (series 5, image 176). Visualized upper chest demonstrates no other acute abnormality. Review of the MIP images confirms the above findings CTA HEAD FINDINGS Anterior circulation: Focal atheromatous plaque within the petrous left ICA with mild stenosis. Petrous right ICA widely patent. Scattered atheromatous plaque within the cavernous/supraclinoid ICAs with mild to moderate multifocal narrowing (no more than 50%). ICA termini well perfused. A1 segments widely patent  bilaterally. Normal anterior communicating artery. Anterior cerebral arteries patent to their distal aspects without stenosis. M1 segments demonstrate atheromatous irregularity but are patent bilaterally without flow-limiting stenosis. Normal MCA bifurcations. No proximal M2 occlusion. Focal atherosclerotic plaque with associated severe left M2 stenosis noted (series 9, image 151). Distal MCA branches well perfused and symmetric. Posterior circulation: Dominant left vertebral artery patent as it crosses into the cranial vault. Atheromatous plaque within the mid-distal left V4 segment with associated moderate approximate 50% stenosis. Left PICA patent. Hypoplastic right vertebral artery largely terminates in PICA, although a tiny branch ascending towards the vertebrobasilar junction. Right PICA patent as well. Basilar demonstrates multifocal atheromatous irregularity short-segment mild stenosis noted within the mid basilar. Superior cerebral arteries patent proximally. Left PCA supplied via the basilar. Predominant fetal type origin of the right PCA. PCAs demonstrate scattered atheromatous irregularity but are patent to their distal aspects without flow-limiting stenosis. Venous sinuses: Grossly patent allowing for timing of the contrast bolus. Anatomic variants: Fetal type origin of the right PCA. No intracranial aneurysm or other vascular abnormality. Delayed phase: Not performed. Review of the MIP images confirms the above findings IMPRESSION: CT HEAD IMPRESSION: 1. Question subtle evolving hypodensity involving the supra ganglionic mid left frontal lobe, suspicious for acute left MCA territory infarct. No intracranial hemorrhage. 2. Aspects equals 9. 3. Underlying chronic anterior left frontal lobe infarct. 4. Age-related cerebral atrophy with mild chronic small vessel ischemic disease. CTA HEAD AND NECK IMPRESSION: 1. Negative CTA for large vessel occlusion. 2. Approximate 65-70% stenosis at the origin of the  left ICA. 3. Approximate 50% stenosis about the proximal right ICA. 4. 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 Distal reconstitution at the distal right  V2 segment. Dominant left vertebral artery widely patent within the neck. 5. 50% atheromatous stenosis involving the mid-distal left V4 segment. 6. Additional diffuse small vessel atheromatous irregularity throughout the intracranial circulation. 7. Emphysema. 8. 6 mm left upper lobe nodule, indeterminate. Non-contrast chest CT at 6-12 months is recommended. If the nodule is stable at time of repeat CT, then future CT at 18-24 months (from today's scan) is considered optional for low-risk patients, but is recommended for high-risk patients. This recommendation follows the consensus statement: Guidelines for Management of Incidental Pulmonary Nodules Detected on CT Images: From the Fleischner Society 2017; Radiology 2017; 284:228-243. Electronically Signed   By: Jeannine Boga M.D.   On: 05/31/2019 21:24   Mr Brain Wo Contrast  Result Date: 06/01/2019 CLINICAL DATA:  Right-sided neurological deficits. Speech disturbance. EXAM: MRI HEAD WITHOUT CONTRAST TECHNIQUE: Multiplanar, multiecho pulse sequences of the brain and surrounding structures were obtained without intravenous contrast. COMPARISON:  Head CT and CTA 05/31/2019 FINDINGS: The patient was unable to tolerate the complete examination. Sagittal T1 and coronal T2 sequences were not obtained. Brain: Patchy acute infarcts are present in the left MCA territory with the most confluent area of infarction involving the posterior left frontal lobe including precentral gyrus. Smaller acute infarcts involve left parietal and left temporal lobe cortex as well as left basal ganglia. No intracranial hemorrhage, mass, midline shift, or extra-axial fluid collection is identified. A chronic infarct is noted in the left frontal lobe anterior to the operculum. Mild cerebral atrophy is not greater  than expected for age. Punctate chronic infarcts are present in the cerebellum bilaterally. Vascular: Major intracranial vascular flow voids are preserved with the left vertebral artery being dominant. Skull and upper cervical spine: No suspicious marrow lesion. Sinuses/Orbits: Mild mucosal thickening in the paranasal sinuses. Moderate right mastoid effusion. Bilateral cataract extraction. Other: None. IMPRESSION: 1. 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 2. Chronic left frontal infarct. Electronically Signed   By: Logan Bores M.D.   On: 06/01/2019 15:05   Dg Swallowing Func-speech Pathology  Result Date: 06/01/2019 Objective Swallowing Evaluation: Type of Study: MBS-Modified Barium Swallow Study  Patient Details Name: Luke Rivera MRN: [id number] Date of Birth: [birth year] Today's Date: 06/01/2019 Time: SLP Start Time (ACUTE ONLY): 1335 -SLP Stop Time (ACUTE ONLY): 1352 SLP Time Calculation (min) (ACUTE ONLY): 17 min Past Medical History: No past medical history on file. Past Surgical History: No past surgical history on file. HPI: 78 y.o. male presenting with gargled speech and right sided deficits. 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 No intracranial hemorrhage. MRI pending  No data recorded Assessment / Plan / Recommendation CHL IP CLINICAL IMPRESSIONS 06/01/2019 Clinical Impression Pt presents with mild oropharyngeal dysphagia characterized by decreased oral propulsion/impaired mastication and a delay to the vallecular space with thin via tsp; delay to pyriform sinus with nectar consistency also noted; straw sips were attempted with thin/nectar, but unsuccessful d/t decreased mentation (ie: biting on straw  despite mod verbal cues); no penetration/aspiration noted during study; pt's mentation decreased overall during exam,so initiation of a conservative diet of Dysphagia 2/thin via tsp recommended with ST f/u for diet tolerance and advancement. SLP Visit Diagnosis Dysphagia, oropharyngeal phase (R13.12);Aphasia (R47.01) Attention and concentration deficit following -- Frontal  lobe and executive function deficit following -- Impact on safety and function Mild aspiration risk   CHL IP TREATMENT RECOMMENDATION 06/01/2019 Treatment Recommendations Therapy as outlined in treatment plan below   Prognosis 06/01/2019 Prognosis for Safe Diet Advancement Good Barriers to Reach Goals Language deficits Barriers/Prognosis Comment -- CHL IP DIET RECOMMENDATION 06/01/2019 SLP Diet Recommendations Dysphagia 2 (Fine chop) solids;Thin liquid;Other (Comment) Liquid Administration via No straw;Cup;Other (Comment) Medication Administration Crushed with puree Compensations Slow rate;Small sips/bites;Effortful swallow;Multiple dry swallows after each bite/sip Postural Changes Seated upright at 90 degrees   CHL IP OTHER RECOMMENDATIONS 06/01/2019 Recommended Consults -- Oral Care Recommendations Oral care BID Other Recommendations --   CHL IP FOLLOW UP RECOMMENDATIONS 06/01/2019 Follow up Recommendations Other (comment)   CHL IP FREQUENCY AND DURATION 06/01/2019 Speech Therapy Frequency (ACUTE ONLY) min 2x/week Treatment Duration 1 week      CHL IP ORAL PHASE 06/01/2019 Oral Phase Impaired Oral - Pudding Teaspoon -- Oral - Pudding Cup -- Oral - Honey Teaspoon -- Oral - Honey Cup -- Oral - Nectar Teaspoon -- Oral - Nectar Cup -- Oral - Nectar Straw -- Oral - Thin Teaspoon -- Oral - Thin Cup -- Oral - Thin Straw -- Oral - Puree -- Oral - Mech Soft Impaired mastication Oral - Regular -- Oral - Multi-Consistency -- Oral - Pill -- Oral Phase - Comment --  CHL IP PHARYNGEAL PHASE 06/01/2019 Pharyngeal Phase Impaired Pharyngeal- Pudding Teaspoon --  Pharyngeal -- Pharyngeal- Pudding Cup -- Pharyngeal -- Pharyngeal- Honey Teaspoon -- Pharyngeal -- Pharyngeal- Honey Cup -- Pharyngeal -- Pharyngeal- Nectar Teaspoon Delayed swallow initiation-pyriform sinuses Pharyngeal -- Pharyngeal- Nectar Cup -- Pharyngeal -- Pharyngeal- Nectar Straw -- Pharyngeal -- Pharyngeal- Thin Teaspoon Delayed swallow initiation-vallecula Pharyngeal -- Pharyngeal- Thin Cup -- Pharyngeal -- Pharyngeal- Thin Straw -- Pharyngeal -- Pharyngeal- Puree Other (Comment) Pharyngeal -- Pharyngeal- Mechanical Soft Other (Comment) Pharyngeal -- Pharyngeal- Regular -- Pharyngeal -- Pharyngeal- Multi-consistency -- Pharyngeal -- Pharyngeal- Pill -- Pharyngeal -- Pharyngeal Comment --  CHL IP CERVICAL ESOPHAGEAL PHASE 06/01/2019 Cervical Esophageal Phase WFL Pudding Teaspoon -- Pudding Cup -- Honey Teaspoon -- Honey Cup -- Nectar Teaspoon -- Nectar Cup -- Nectar Straw -- Thin Teaspoon -- Thin Cup -- Thin Straw -- Puree -- Mechanical Soft -- Regular -- Multi-consistency -- Pill -- Cervical Esophageal Comment -- Elvina Sidle, M.S., CCC-SLP 06/01/2019, 2:39 PM              Vas [place] Transcranial Doppler W Bubbles  Result Date: 06/02/2019  Transcranial Doppler with Bubble Indications: Stroke. History: Stroke with known DVT, Apashia, right sided wekness, HTN, HLD, DM II, peripheral neuropathy,CAD with DES. Performing Technologist: Toma Copier RVS  Examination Guidelines: A complete evaluation includes B-mode imaging, spectral Doppler, color Doppler, and power Doppler as needed of all accessible portions of each vessel. Bilateral testing is considered an integral part of a complete examination. Limited examinations for reoccurring indications may be performed as noted.  Summary:  A vascular evaluation was performed. The right middle cerebral artey was studied. Patient already had an IV placed in the left wrist. No HITS were noted at rest and after two Valsalva maneuvers. No apparent PFO. *See table(s)  above for measurements and observations.    Preliminary    Ct Head Code Stroke Wo Contrast  Result Date: 05/31/2019 CLINICAL DATA:  Initial evaluation for possible stroke, right-sided deficits. EXAM: CT ANGIOGRAPHY HEAD AND NECK TECHNIQUE: Multidetector CT imaging of the head and neck was performed using the standard protocol during bolus administration of  intravenous contrast. Multiplanar CT image reconstructions and MIPs were obtained to evaluate the vascular anatomy. Carotid stenosis measurements (when applicable) are obtained utilizing NASCET criteria, using the distal internal carotid diameter as the denominator. CONTRAST:  68mL OMNIPAQUE IOHEXOL 350 MG/ML SOLN COMPARISON:  None available. FINDINGS: CT HEAD FINDINGS Brain: Generalized age-related cerebral atrophy with mild chronic small vessel ischemic disease. 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 There is question of subtle evolving hypodensity involving the supra ganglionic cortical gray matter, M5 distribution (series 6, image 43). No other definite acute large vessel territory infarct. No acute intracranial hemorrhage. No mass lesion or midline shift. No hydrocephalus. No extra-axial fluid collection. Vascular: No hyperdense vessel. Calcified atherosclerosis present at skull base. Skull: Scalp soft tissues and calvarium within normal limits. Sinuses/Orbits: Globes and orbital soft tissues within normal limits. Chronic right sphenoid sinus disease. Mucosal thickening noted within the inferior left maxillary sinus. Paranasal sinuses are otherwise clear. Small right mastoid effusion noted. Other: None. ASPECTS [facility] Stroke Program Early CT Score) - Ganglionic level infarction (caudate, lentiform nuclei, internal capsule, insula, M1-M3 cortex): 7 - Supraganglionic infarction (M4-M6 cortex): 2 Total score (0-10 with 10 being normal): 9 Review of the MIP images confirms the  above findings CTA NECK FINDINGS Aortic arch: Visualized arch of normal caliber with normal 3 vessel morphology. Mild atheromatous change about the origin of the great vessels without hemodynamically significant stenosis. Visualized subclavian arteries widely patent. Right carotid system: Right common carotid artery patent from its origin to the bifurcation without stenosis. Scattered mixed plaque about the proximal right ICA with associated stenosis of up to 50% by NASCET criteria. Right ICA otherwise widely patent to the skull base. Left carotid system: Left common carotid artery patent from its origin to the bifurcation without flow-limiting stenosis. Eccentric calcified plaque at the origin of the left ICA with associated stenosis of approximately 65-70% by NASCET criteria. Left ICA otherwise widely patent to the skull base. Vertebral arteries: Both of the vertebral arteries arise from the subclavian arteries. Dominant left vertebral artery widely patent within the neck. 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 Distal reconstitution via muscular branches at the distal right V2 segment. Right vertebral patent distally to the skull base without stenosis or other abnormality. Skeleton: No acute osseous finding. No discrete lytic or blastic osseous lesions. 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 Other neck: No other acute soft tissue abnormality within the neck. Upper chest: Upper lobe predominant emphysema. Superimposed 6 mm left upper lobe nodule (series 5, image 176). Visualized upper chest demonstrates no other acute abnormality. Review of the MIP images confirms the above findings CTA HEAD FINDINGS Anterior circulation: Focal atheromatous plaque within the petrous left ICA with mild stenosis. Petrous right ICA widely patent. Scattered atheromatous plaque within the cavernous/supraclinoid ICAs with mild to moderate multifocal narrowing (no more than 50%).  ICA termini well perfused. A1 segments widely patent bilaterally. Normal anterior communicating artery. Anterior cerebral arteries patent to their distal aspects without stenosis. M1 segments demonstrate atheromatous irregularity but are patent bilaterally without flow-limiting stenosis. Normal MCA bifurcations. No proximal M2 occlusion. Focal atherosclerotic plaque with associated severe left M2 stenosis noted (series 9, image 151). Distal MCA branches well perfused and symmetric. Posterior circulation: Dominant left vertebral artery patent as it crosses into the cranial vault. Atheromatous plaque within the mid-distal left V4 segment with associated moderate approximate 50% stenosis. Left PICA patent. Hypoplastic right vertebral artery largely terminates  in PICA, although a tiny branch ascending towards the vertebrobasilar junction. Right PICA patent as well. Basilar demonstrates multifocal atheromatous irregularity short-segment mild stenosis noted within the mid basilar. Superior cerebral arteries patent proximally. Left PCA supplied via the basilar. Predominant fetal type origin of the right PCA. PCAs demonstrate scattered atheromatous irregularity but are patent to their distal aspects without flow-limiting stenosis. Venous sinuses: Grossly patent allowing for timing of the contrast bolus. Anatomic variants: Fetal type origin of the right PCA. No intracranial aneurysm or other vascular abnormality. Delayed phase: Not performed. Review of the MIP images confirms the above findings IMPRESSION: CT HEAD IMPRESSION: 1. Question subtle evolving hypodensity involving the supra ganglionic mid left frontal lobe, suspicious for acute left MCA territory infarct. No intracranial hemorrhage. 2. Aspects equals 9. 3. Underlying chronic anterior left frontal lobe infarct. 4. Age-related cerebral atrophy with mild chronic small vessel ischemic disease. CTA HEAD AND NECK IMPRESSION: 1. Negative CTA for large vessel occlusion.  2. 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 3. Approximate 50% stenosis about the proximal right ICA. 4. 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 Distal reconstitution at the distal right V2 segment. Dominant left vertebral artery widely patent within the neck. 5. 50% atheromatous stenosis involving the mid-distal left V4 segment. 6. Additional diffuse small vessel atheromatous irregularity throughout the intracranial circulation. 7. Emphysema. 8. 6 mm left upper lobe nodule, indeterminate. Non-contrast chest CT at 6-12 months is recommended. If the nodule is stable at time of repeat CT, then future CT at 18-24 months (from today's scan) is considered optional for low-risk patients, but is recommended for high-risk patients. This recommendation follows the consensus statement: Guidelines for Management of Incidental Pulmonary Nodules Detected on CT Images: From the Fleischner Society 2017; Radiology 2017; 284:228-243. Electronically Signed   By: Jeannine Boga M.D.   On: 05/31/2019 21:24    PHYSICAL EXAM Pleasant elderly Caucasian male who is not in distress. . Afebrile. Head is nontraumatic. Neck is supple without bruit.    Cardiac exam no murmur or gallop. Lungs are clear to auscultation. Distal pulses are well felt. Neurological Exam : awake alert he has severe expressive aphasia he can speak occasional words but not sentences.  Is able to follow midline and most one-step commands but not complex ones.  There is mild dysarthria.  His left gaze preference but able to look to the right past midline.  Blinks to threat on the left but not the right.  Right lower facial weakness.  Tongue midline.  Motor system exam shows dense right hemiparesis with grade 1-2/5 strength in both upper and lower extremities with hypotonia.  Sensation appears diminished on the right.  Right plantar upgoing left downgoing.  Gait not tested.   :   ASSESSMENT/PLAN [name] is a 78  y.o. 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  Old left frontal infarct.  Age-related small vessel disease and atrophy.  ASPECTS 9    CTA head & neck no LVO.  L ICA 65 to 70%.  R ICA 50%.  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  Dominant L VA.  Mid distal L V4 50%.  Diffuse small vessel disease anterior circulation.  Emphysema.  6 mm LUL nodule, indeterminate.  MRI acute left posterior frontal MCA infarct.  Chronic left frontal MCA infarct as well.    2D Echo normal ejection fraction no cardiac source of embolism.  TCD bubble  Negative for PFO.   LDL 49  HgbA1c 7.3  Lovenox 40 mg sq daily for VTE prophylaxis  clopidogrel 75 mg daily and Eliquis (apixaban) daily prior to admission, now on aspirin 300 mg suppository daily. Recommend to resume Eliquis 5 mg twice daily and stop aspirin   Therapy recommendations: CIR (baseline uses cane)  Disposition: Pending  ? hx AF   Hypertension  Stable . Permissive hypertension (OK if < 220/120) but gradually normalize in 5-7 days . Long-term BP goal normotensive  Hyperlipidemia  Home meds: Lipitor 80  Statin currently on hold due to n.p.o. 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 Secondary to stroke . NPO . Speech on board   Other Stroke Risk Factors  Advanced age  Former cigarette smoker  Obesity, Body mass index is 36.48  kg/m., recommend weight loss, diet and exercise as appropriate   Coronary artery disease s/p stents on Plavix prior to admission  Other Active Problems  Mild difficulty with short-term memory prior to admission   GERD  Incidental pulmonary nodule, repeat CT chest in 6 to 12 months  Hospital day # 2  .  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 He has possible h/o AFIB as per his wife.  Recommend continue Eliquis when he is able to swallow safely but change dose to 5 mg twice daily.  Also spoke to the patient's wife over the phone and answered questions about his care. D/W family practice intern on call. Greater than 50% time during this 25-minute visit was spent on counseling and coordination of care about his embolic stroke and discussion about anticoagulation, dysphagia and answering questions Stroke team will sign off. Call for questions. Antony Contras, MD Medical Director [facility] Stroke Center Pager: [phone] 06/02/2019 3:24 PM   To contact Stroke Continuity provider, please refer to [email or web address] After hours, contact General Neurology

## 2019-06-02 NOTE — Progress Notes (Signed)
TCD with bubbles completed. Preliminary results in Chart review CV Proc. [facility], RVS  06/02/2019, 3:15 pm

## 2019-06-02 NOTE — Progress Notes (Signed)
SLP Cancellation Note  Patient Details Name: Luke Rivera MRN: [id number] DOB: 1940-12-29   Cancelled treatment:       Reason Eval/Treat Not Completed: Patient at procedure or test/unavailable; x3   Elvina Sidle, M.S., CCC-SLP 06/02/2019, 4:14 PM

## 2019-06-02 NOTE — Progress Notes (Signed)
Spoke with Dr. Leonie Man.  He notes that wife had noted patient had a history of A. fib in the past.  Per Dr. Leonie Man, will restart Eliquis at A. fib dosing.  Patient's creatinine is 1.06, does not require dose adjustment.  Patient is however 78 years old, but is greater than 60 kg.  Therefore no dose adjustment required.  Will increase patient's home Eliquis to 5 mg twice daily.  [place] Constable, D.O.  PGY-1 Family Medicine  06/02/2019 1:41 PM

## 2019-06-02 NOTE — Progress Notes (Addendum)
Family Medicine Teaching Service Daily Progress Note Intern Pager: [phone]  Patient name: Luke Rivera Medical record number: [id number] Date of birth: [birth year] Age: 78 y.o. Gender: male  Primary Care Provider: Kristopher Glee., MD Consultants: Neuro Code Status: DNR  Pt Overview and Major Events to Date:  6/22 Admitted to FPTS  Assessment and Plan: Luke Rivera is a 78 y.o. male presenting with gargled speech and right sided deficits. 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  Left MCA embolic infarct MRI consistent with CT head findings, acute left MCA infarcts with greatest involvement of posterior frontal lobe, chronic left frontal infarct.  Echo with EF of 55 to 14%, no diastolic dysfunction, no PFO visualized.  LDL 49, hemoglobin A1c 7.3.  Patient evaluated by speech and placed on dysphagia 2 diet.  Per neurology recommendations, will consider loop recorder or 30-day monitor to look for A. fib, as will change home Eliquis dosing.  Wife think he has dhad an event monitor previously.  Patient continues to have expressive aphasia, but follows commands.  No other neurologic deficits noted. -Neurology following, appreciate recommendations -Frequent neuro checks -Continue aspirin 325 mg daily -PT/OT: CIR, referral placed -permissive HTN (<220/120), normalize gradually in 5 to 7 days  Low-Grade Temp Temp 100.5 at 2000.  WBC 7.6. - cont to monitor  Oxygen Requirement Patient without noted desaturations in chart.  Was weaned to room air yesterday, placed back on O2 per [place] overnight.  This a.m. on 2.5 L. - wean as tolerated  HTN:  BP this a.m. 151/76.  Home meds: Lasix 40mg  PRN, Lopressor 12.5mg  MWF, losartan 12.5mg  3x/week, Potassium gluconate 99mg  QD - permissive HTN (<220/120), normalize gradually in 5 to 7 days - hold home meds  T2DM with periopheral neuropathy:  A1c on  admission 7.3. Home meds: Glipizide 10mg  BID, Lantus 40U qHS, Metformin 1000mg  qHS, Gabapentin 600mg  BID.  CBG this a.m. 162. - sSSI - CBGs AC/HS - A1c pending - hold all home meds  HLD: Home meds: Atorvastatin 40mg  QD.  LDL 49. -Restart home atorvastatin, goal LDL less than 70  H/o CAD with two DES (2013): History of two DES in 2nd marginal and mid RCA in 03/2012. 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  FEN/GI: Dysphagia 2 diet per SLP recommendations PPx: Lovenox  Disposition: CIR pending clearance from neurology  Subjective:  Patient continues to have expressive aphasia.  When asked if he has pain, he shakes his head no.  Objective: Temp:  [98.1 F (36.7 C)-100.5 F (38.1 C)] 99.8 F (37.7 C) (06/24 0406) Pulse Rate:  [71-96] 96 (06/24 0406) Resp:  [17-19] 19 (06/24 0406) BP: (145-168)/(64-75) 145/64 (06/24 0406) SpO2:  [93 %-100 %] 96 % (06/24 0406) FiO2 (%):  [2.5 %-3 %] 2.5 % (06/24 0406)  Physical Exam:  General: 78 y.o. male in NAD Cardio: RRR no m/r/g Lungs: CTAB, no wheezing, no rhonchi, no crackles, no IWOB on 2.5L Abdomen: Soft, non-tender to palpation, non-distended, positive bowel sounds Skin: warm and dry Extremities: No edema, venous stasis dermatitis on bilateral lower extremities Neuro: Expressive aphasia, follows  commands  Laboratory: Recent Labs  Lab 05/31/19 2009 05/31/19 2012 06/01/19 0736 06/02/19 0456  WBC 5.7  --  6.5 7.6  HGB 12.9* 12.6* 11.8* 12.6*  HCT 39.1 37.0* 35.6* 37.9*  PLT 269  --  245 240   Recent Labs  Lab 05/31/19 2009 05/31/19 2012 06/01/19 0736 06/02/19 0456  NA 140 140 143 142  K  3.7 3.6 3.6 3.6  CL 106 104 108 106  CO2 22  --  26 26  BUN 11 11 10 12   CREATININE 1.08 0.90 1.05 1.06  CALCIUM 8.9  --  8.8* 8.8*  PROT 6.4*  --   --   --   BILITOT 1.0  --   --   --   ALKPHOS 74  --   --   --   ALT 23  --   --   --   AST 25  --   --   --   GLUCOSE 202* 199* 170* 178*     Imaging/Diagnostic Tests: Dg Chest 1 View  Result Date: 06/01/2019 CLINICAL DATA:  Altered mental status today. EXAM: CHEST  1 VIEW COMPARISON:  None. FINDINGS: The lungs are clear. Heart size is normal. No pneumothorax or pleural effusion. No acute or focal bony abnormality IMPRESSION: Negative chest. Electronically Signed   By: Inge Rise M.D.   On: 06/01/2019 10:29   Dg Abd 1 View  Result Date: 06/01/2019 CLINICAL DATA:  Altered mental status. EXAM: ABDOMEN - 1 VIEW COMPARISON:  None. FINDINGS: The bowel gas pattern is normal. No radio-opaque calculi or other significant radiographic abnormality are seen. Surgical clips right upper quadrant and spinal fusion are noted. IMPRESSION: Negative exam. Electronically Signed   By: Inge Rise M.D.   On: 06/01/2019 10:34   Mr Brain Wo Contrast  Result Date: 06/01/2019 CLINICAL DATA:  Right-sided neurological deficits. Speech disturbance. EXAM: MRI HEAD WITHOUT CONTRAST TECHNIQUE: Multiplanar, multiecho pulse sequences of the brain and surrounding structures were obtained without intravenous contrast. COMPARISON:  Head CT and CTA 05/31/2019 FINDINGS: The patient was unable to tolerate the complete examination. Sagittal T1 and coronal T2 sequences were not obtained. Brain: Patchy acute infarcts are present in the left MCA territory with the most confluent area of infarction involving the posterior left frontal lobe including precentral gyrus. Smaller acute infarcts involve left parietal and left temporal lobe cortex as well as left basal ganglia. No intracranial hemorrhage, mass, midline shift, or extra-axial fluid collection is identified. A chronic  infarct is noted in the left frontal lobe anterior to the operculum. Mild cerebral atrophy is not greater than expected for age. Punctate chronic infarcts are present in the cerebellum bilaterally. Vascular: Major intracranial vascular flow voids are preserved with the left vertebral artery being dominant. Skull and upper cervical spine: No suspicious marrow lesion. Sinuses/Orbits: Mild mucosal thickening in the paranasal sinuses. Moderate right mastoid effusion. Bilateral cataract extraction. Other: None. IMPRESSION: 1. 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 2. Chronic left frontal infarct. Electronically Signed   By: Logan Bores M.D.   On: 06/01/2019 15:05   Dg Swallowing Func-speech Pathology  Result Date: 06/01/2019 Objective Swallowing Evaluation: Type of Study: MBS-Modified Barium Swallow Study  Patient Details Name: Diyan Dave MRN: [id number] Date of Birth: July 31, 1941 Today's Date: 06/01/2019 Time: SLP Start Time (ACUTE ONLY): 1335 -SLP Stop Time (ACUTE ONLY): 1352 SLP Time Calculation (min) (ACUTE ONLY): 17 min Past Medical History: No past medical history on file. Past Surgical  History: No past surgical history on file. HPI: 78 y.o. male presenting with gargled speech and right sided deficits. 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 No intracranial hemorrhage. MRI pending  No data recorded Assessment / Plan / Recommendation CHL IP CLINICAL IMPRESSIONS 06/01/2019 Clinical Impression Pt presents with mild oropharyngeal dysphagia characterized by decreased oral propulsion/impaired mastication and a delay to the vallecular space with thin via tsp; delay to pyriform sinus with nectar consistency also noted;  straw sips were attempted with thin/nectar, but unsuccessful d/t decreased mentation (ie: biting on straw despite mod verbal cues); no penetration/aspiration noted during study; pt's mentation decreased overall during exam,so initiation of a conservative diet of Dysphagia 2/thin via tsp recommended with ST f/u for diet tolerance and advancement. SLP Visit Diagnosis Dysphagia, oropharyngeal phase (R13.12);Aphasia (R47.01) Attention and concentration deficit following -- Frontal lobe and executive function deficit following -- Impact on safety and function Mild aspiration risk   CHL IP TREATMENT RECOMMENDATION 06/01/2019 Treatment Recommendations Therapy as outlined in treatment plan below   Prognosis 06/01/2019 Prognosis for Safe Diet Advancement Good Barriers to Reach Goals Language deficits Barriers/Prognosis Comment -- CHL IP DIET RECOMMENDATION 06/01/2019 SLP Diet Recommendations Dysphagia 2 (Fine chop) solids;Thin liquid;Other (Comment) Liquid Administration via No straw;Cup;Other (Comment) Medication Administration Crushed with puree Compensations Slow rate;Small sips/bites;Effortful swallow;Multiple dry swallows after each bite/sip Postural Changes Seated upright at 90 degrees   CHL IP OTHER RECOMMENDATIONS 06/01/2019 Recommended Consults -- Oral Care Recommendations Oral care BID Other Recommendations --   CHL IP FOLLOW UP RECOMMENDATIONS 06/01/2019 Follow up Recommendations Other (comment)   CHL IP FREQUENCY AND DURATION 06/01/2019 Speech Therapy Frequency (ACUTE ONLY) min 2x/week Treatment Duration 1 week      CHL IP ORAL PHASE 06/01/2019 Oral Phase Impaired Oral - Pudding Teaspoon -- Oral - Pudding Cup -- Oral - Honey Teaspoon -- Oral - Honey Cup -- Oral - Nectar Teaspoon -- Oral - Nectar Cup -- Oral - Nectar Straw -- Oral - Thin Teaspoon -- Oral - Thin Cup -- Oral - Thin Straw -- Oral - Puree -- Oral - Mech Soft Impaired mastication Oral - Regular -- Oral - Multi-Consistency -- Oral - Pill -- Oral Phase -  Comment --  CHL IP PHARYNGEAL PHASE 06/01/2019 Pharyngeal Phase Impaired Pharyngeal- Pudding Teaspoon -- Pharyngeal -- Pharyngeal- Pudding Cup -- Pharyngeal -- Pharyngeal- Honey Teaspoon -- Pharyngeal -- Pharyngeal- Honey Cup -- Pharyngeal -- Pharyngeal- Nectar Teaspoon Delayed swallow initiation-pyriform sinuses Pharyngeal -- Pharyngeal- Nectar Cup -- Pharyngeal -- Pharyngeal- Nectar Straw -- Pharyngeal -- Pharyngeal- Thin Teaspoon Delayed swallow initiation-vallecula Pharyngeal -- Pharyngeal- Thin Cup -- Pharyngeal -- Pharyngeal- Thin Straw -- Pharyngeal -- Pharyngeal- Puree Other (Comment) Pharyngeal -- Pharyngeal- Mechanical Soft Other (Comment) Pharyngeal -- Pharyngeal- Regular -- Pharyngeal -- Pharyngeal- Multi-consistency -- Pharyngeal -- Pharyngeal- Pill -- Pharyngeal -- Pharyngeal Comment --  CHL IP CERVICAL ESOPHAGEAL PHASE 06/01/2019 Cervical Esophageal Phase WFL Pudding Teaspoon -- Pudding Cup -- Honey Teaspoon -- Honey Cup -- Nectar Teaspoon -- Nectar Cup -- Nectar Straw -- Thin Teaspoon -- Thin Cup -- Thin Straw -- Puree -- Mechanical Soft -- Regular -- Multi-consistency -- Pill -- Cervical Esophageal Comment -- Elvina Sidle, M.S., CCC-SLP 06/01/2019, 2:39 PM  Camary Sosa, Bernita Raisin, DO 06/02/2019, 7:36 AM PGY-1, [place] Intern pager: [phone], text pages welcome

## 2019-06-03 LAB — CBC
HCT: 36.8 % — ABNORMAL LOW (ref 39.0–52.0)
Hemoglobin: 12.2 g/dL — ABNORMAL LOW (ref 13.0–17.0)
MCH: 28.6 pg (ref 26.0–34.0)
MCHC: 33.2 g/dL (ref 30.0–36.0)
MCV: 86.4 fL (ref 80.0–100.0)
Platelets: 252 10*3/uL (ref 150–400)
RBC: 4.26 MIL/uL (ref 4.22–5.81)
RDW: 15.3 % (ref 11.5–15.5)
WBC: 6.4 10*3/uL (ref 4.0–10.5)
nRBC: 0 % (ref 0.0–0.2)

## 2019-06-03 LAB — GLUCOSE, CAPILLARY
Glucose-Capillary: 168 mg/dL — ABNORMAL HIGH (ref 70–99)
Glucose-Capillary: 174 mg/dL — ABNORMAL HIGH (ref 70–99)
Glucose-Capillary: 192 mg/dL — ABNORMAL HIGH (ref 70–99)
Glucose-Capillary: 199 mg/dL — ABNORMAL HIGH (ref 70–99)

## 2019-06-03 LAB — BASIC METABOLIC PANEL
Anion gap: 8 (ref 5–15)
BUN: 15 mg/dL (ref 8–23)
CO2: 26 mmol/L (ref 22–32)
Calcium: 8.8 mg/dL — ABNORMAL LOW (ref 8.9–10.3)
Chloride: 106 mmol/L (ref 98–111)
Creatinine, Ser: 0.94 mg/dL (ref 0.61–1.24)
GFR calc Af Amer: >60 mL/min (ref >60)
GFR calc non Af Amer: >60 mL/min (ref >60)
Glucose, Bld: 174 mg/dL — ABNORMAL HIGH (ref 70–99)
Potassium: 3.4 mmol/L — ABNORMAL LOW (ref 3.5–5.1)
Sodium: 140 mmol/L (ref 135–145)

## 2019-06-03 LAB — MAGNESIUM: Magnesium: 1.7 mg/dL (ref 1.7–2.4)

## 2019-06-03 MED ORDER — METOPROLOL TARTRATE 12.5 MG HALF TABLET: 12.5000 mg | ORAL_TABLET | Freq: Two times a day (BID) | ORAL | Status: DC

## 2019-06-03 MED ORDER — POTASSIUM CHLORIDE CRYS ER 20 MEQ PO TBCR
40.0000 meq | EXTENDED_RELEASE_TABLET | Freq: Once | ORAL | Status: AC
  Administered 2019-06-03: 40 meq via ORAL
  Filled 2019-06-03: qty 2

## 2019-06-03 MED ORDER — CLOPIDOGREL BISULFATE 75 MG PO TABS
75.0000 mg | ORAL_TABLET | Freq: Every day | ORAL | Status: DC
  Administered 2019-06-03 – 2019-06-07 (×5): 75 mg via ORAL
  Filled 2019-06-03 (×5): qty 1

## 2019-06-03 NOTE — Progress Notes (Signed)
   06/03/19 0348  Provider Notification  Provider Name/Title Dr Tarry Kos (on call)  Date Provider Notified 06/03/19  Time Provider Notified [phone]  Notification Type Page  Notification Reason Other (Comment) (Pt having frequent PVCs, junctional rythym and missed beats )  Response No new orders (Pt not Symptomatic)  Date of Provider Response 06/03/19  Time of Provider Response [phone]

## 2019-06-03 NOTE — Progress Notes (Signed)
  Speech Language Pathology Treatment: Cognitive-Linquistic(Aphasia)  Patient Details Name: Luke Rivera MRN: [id number] DOB: 11/21/1941 Today's Date: 06/03/2019 Time: 3612-2449 SLP Time Calculation (min) (ACUTE ONLY): 22 min  Assessment / Plan / Recommendation Clinical Impression  Pt was seen for aphasia treatment and was cooperative throughout the session. Intelligibility was reduced secondary to imprecise articulation but periods of improved precision were also noted. He produced 1-6 and Monday and Tuesday with mod verbal cues during automatic sequence tasks. He demonstrated confrontational naming with 60% accuracy increasing to 80% with mod verbal and orthographic cues. He achieved 60% accuracy with sentence completion increasing to 80% with phonemic cues. He required mod cues to answer complex yes/no questions. SLP will continue to follow pt.    HPI HPI: 78 y.o. male presenting with gargled speech and right sided deficits. 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 No intracranial hemorrhage. MRI pending       SLP Plan          Recommendations                  Oral Care Recommendations: Oral care QID Follow up Recommendations: Inpatient Rehab SLP Visit Diagnosis: Aphasia (R47.01);Dysarthria and anarthria (R47.1)       Neithan Day I. Hardin Negus, [place], [place] Office number [phone] Pager [place] 06/03/2019, 2:43 PM

## 2019-06-03 NOTE — Progress Notes (Signed)
Physician team made aware of 2.1 second pause per CCMD.

## 2019-06-03 NOTE — Progress Notes (Signed)
Inpatient Rehab Admissions Coordinator:   I was able to speak to pt's wife at length.  She understands that there is a risk that insurance will not approve for pt to do CIR and then SNF subsequently.  She still wants to attempt CIR first and will plan for the next phase when it gets here, whatever that may be.  I will open up a case with pt's insurance today and plan for possible admission tomorrow or early next week pending their decision and bed availability.   Shann Medal, PT, DPT Admissions Coordinator [phone] 06/03/19  12:18 PM

## 2019-06-03 NOTE — Progress Notes (Signed)
FPTS Interim Progress Note  Received page from nurse concerning episodes of PVCs and dropped beats on telemetry.  Patient completely asymptomatic and sleeping.  Given new concern for A. Fib/arrythmia and recent left MCA infarct, will obtain EKG to further evaluate.  Luke Rivera [place], DO 06/03/2019, 4:08 AM PGY-1, [facility] Service pager [phone]

## 2019-06-03 NOTE — Progress Notes (Signed)
Physical Therapy Treatment Patient Details Name: Luke Rivera MRN: [id number] DOB: 02-05-1941 Today's Date: 06/03/2019    History of Present Illness 78 y.o. male presenting with gargled speech and right sided deficits. 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 No intracranial hemorrhage. MRI pending    PT Comments    Patient seen for mobility progression. Patient following simple commands in session and demonstrating good initiation for all mobility.  Does require support at trunk to come into sitting as well as significant assist in standing. Patient seemingly very thankful for mobility today. Will continue to recommend CIR at discharge.    Follow Up Recommendations  CIR     Equipment Recommendations  Other (comment)(TBD)    Recommendations for Other Services Rehab consult     Precautions / Restrictions Precautions Precautions: Fall Restrictions Weight Bearing Restrictions: No    Mobility  Bed Mobility Overal bed mobility: Needs Assistance Bed Mobility: Rolling;Sidelying to Sit Rolling: Min assist Sidelying to sit: Mod assist;Max assist       General bed mobility comments: able to roll and lower LE off edge of bed; requires Mod/Max A at trunk to come into sitting  Transfers Overall transfer level: Needs assistance Equipment used: 2 person hand held assist Transfers: Sit to/from [facility] Sit to Stand: Mod assist;Max assist;+2 physical assistance Stand pivot transfers: Max assist;+2 physical assistance       General transfer comment: standing from bedside with Mod/Max of 2 with good push from L UE/LE; cueing for posturing once in standing; difficulty with weight shift during  transfer  Ambulation/Gait             General Gait Details: unable   Stairs             Wheelchair Mobility    Modified Rankin (Stroke Patients Only)       Balance Overall balance assessment: Needs assistance Sitting-balance support: Single extremity supported;Feet supported Sitting balance-Leahy Scale: Fair     Standing balance support: Bilateral upper extremity supported;During functional activity Standing balance-Leahy Scale: Zero                              Cognition Arousal/Alertness: Awake/alert Behavior During Therapy: Flat affect Overall Cognitive Status: Difficult to assess                                 General Comments: able to follow simple commands throughout session      Exercises      General Comments General comments (skin integrity, edema, etc.): patient holding PT hand at end of session and held it to his cheek - he seems very frustrated regarding speech, but thankful to be out of bed - very pleasant gentleman      Pertinent Vitals/Pain Pain Assessment: Faces Faces Pain Scale: No hurt    Home Living                      Prior Function            PT Goals (current goals can now be found in the care plan section) Acute Rehab PT Goals Patient Stated Goal: none  stated PT Goal Formulation: Patient unable to participate in goal setting Time For Goal Achievement: 06/15/19 Potential to Achieve Goals: Fair Progress towards PT goals: Progressing toward goals    Frequency    Min 4X/week      PT Plan Current plan remains appropriate    Co-evaluation              AM-PAC PT "6 Clicks" Mobility   Outcome Measure  Help needed turning from your back to your side while in a flat bed without using bedrails?: A Little Help needed moving from lying on your back to sitting on the side of a flat bed without using bedrails?: A Lot Help needed moving to and from a bed to a chair (including a  wheelchair)?: A Lot Help needed standing up from a chair using your arms (e.g., wheelchair or bedside chair)?: A Lot Help needed to walk in hospital room?: Total Help needed climbing 3-5 steps with a railing? : Total 6 Click Score: 11    End of Session Equipment Utilized During Treatment: Gait belt;Oxygen Activity Tolerance: Patient tolerated treatment well Patient left: in chair;with call bell/phone within reach;with chair alarm set Nurse Communication: Mobility status PT Visit Diagnosis: Unsteadiness on feet (R26.81);Other abnormalities of gait and mobility (R26.89);Muscle weakness (generalized) (M62.81)     Time: 5868-2574 PT Time Calculation (min) (ACUTE ONLY): 24 min  Charges:  $Therapeutic Activity: 23-37 mins                      Lanney Gins, PT, DPT Supplemental Physical Therapist 06/03/19 3:27 PM Pager: [phone] Office: [phone]

## 2019-06-03 NOTE — Progress Notes (Signed)
Attempted to call patient's wife for update.  No answer, VM left.  Please page [phone] if she calls back.  [place] Constable, D.O.  PGY-1 Family Medicine  06/03/2019 12:41 PM

## 2019-06-03 NOTE — Progress Notes (Addendum)
Inpatient Rehab Admissions:  Inpatient Rehab Consult received.  I met with patient at the bedside for rehabilitation assessment and to discuss goals and expectations of an inpatient rehab admission.  He is very aphasic, I will need to contact his wife to confirm that she will be able to support patient 24/7 at discharge and obtain insurance prior authorization prior to possible admission tomorrow or Monday.   Addendum: I was able to speak to patient's daughter.  She is agreeable to CIR, and states her mother would be too, however she states they will need to come together as a family and determine whether they would be able to provide hands on care for patient 24/7 at discharge.  It is highly likely that he will need, at best, minimal assistance for most mobility and ADLs at discharge and with his insurance, the possibility of transitioning from CIR to SNF is low.  Family will discuss today and get back to me with their decision.   Signed: Shann Medal, PT, DPT Admissions Coordinator [phone] 06/03/19  11:21 AM

## 2019-06-03 NOTE — Progress Notes (Addendum)
Family Medicine Teaching Service Daily Progress Note Intern Pager: [phone]  Patient name: Luke Rivera Medical record number: [id number] Date of birth: 10/19/1941 Age: 78 y.o. Gender: male  Primary Care Provider: Kristopher Glee., MD Consultants: Neuro Code Status: DNR  Pt Overview and Major Events to Date:  6/22 Admitted to FPTS  Assessment and Plan: Alvin Diffee is a 78 y.o. 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 Stable for discharge to CIR.  Left MCA embolic infarct MRI consistent with CT head findings, acute left MCA infarct with greatest involvement of posterior frontal lobe, chronic left frontal infarct.  Patient had an echo and transcutaneous Dopplers without PFO noted.  Per discussion with neurology, believe that patient may have a history of A. fib after discussing with his wife, and given likely embolic stroke, decided to place patient on Eliquis at A. fib dosing.  Neurology has signed off.  This a.m. remains dysarthric, with right-sided upper and lower extremity weakness.  Can follow simple commands.  Was not able to write when asked to write on a piece of paper with a pen. -Neurology has signed off -Frequent neuro checks -Continue Eliquis 5 mg twice daily, Plavix daily -Patient should NOT be on aspirin -PT/OT: CIR, referral placed -permissive HTN (<220/120), normalize gradually in 5 to 7 days  Possible atrial fibrillation, PVCs Patient overnight the patient having multiple PVCs/dropped beats.  Patient asymptomatic during that time.  EKG obtained showing first-degree AV block with a dropped beat.  Patient is currently anticoagulated for possible atrial fibrillation his wife noted a history of A. fib to Dr. Leonie Man. -Continue Eliquis 5 mg twice daily -Patient has an  outpatient cardiologist, would recommend follow-up with them as outpatient  Low-Grade Temp Temp 100.5 at 2000 on 6/23.  He has remained afebrile since that time.  WBC 6.4 this a.m. - cont to monitor  Hypokalemia K3.4. -Add on mag -K-Dur 40 mEq x 1  Oxygen Requirement No desaturations noted in chart.  Patient on 2 L this AM.  Was weaned to room air yesterday, but was increased to 1 L throughout the day. - wean as tolerated  HTN:  BP this a.m. 148/65.  Home meds: Lasix 40mg  PRN, Lopressor 12.5mg  BID, losartan 12.5mg  3x/week, Potassium gluconate 99mg  QD - permissive HTN (<220/120), normalize gradually in 5 to 7 days - hold home meds, have plan to restart Lopressor today, but patient had 2-second pause noted on telemetry, therefore will continue to hold  T2DM with periopheral neuropathy:  A1c on admission 7.3. Home meds: Glipizide 10mg  BID, Lantus 40U qHS, Metformin 1000mg  qHS, Gabapentin 600mg  BID.  CBG this a.m. 168. - sSSI - CBGs AC/HS - hold all home meds  HLD: Home meds: Atorvastatin 40mg  QD.  LDL 49. -Restart home atorvastatin, goal LDL less than 70  H/o CAD with two DES (2013): History of two DES in 2nd marginal and mid RCA in 03/2012. 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 fib dosing  GERD: Protonix 40mg  QD  Incidental Pulmonary Nodule: Head CTA notable for an indeterminant 19mm left upper lobe nodule with recommendation for a repeat non-contrast chest CT at 6-12 months.  FEN/GI: Dysphagia 2 diet  per SLP recommendations PPx: Eliquis  Disposition: CIR when bed available, patient is medically stable for discharge  Subjective:  Patient remains dysarthric, does not appear to be in discomfort.  He is awake and follows commands.  Objective: Temp:  [97.8 F (36.6 C)-98.5 F (36.9 C)] 98.5 F (36.9 C) (06/25 0732) Pulse Rate:  [58-69] 58  (06/25 0732) Resp:  [18] 18 (06/25 0732) BP: (127-155)/(59-74) 148/65 (06/25 0732) SpO2:  [92 %-100 %] 100 % (06/25 0732)  Physical Exam:  General: 78 y.o. male in NAD Cardio: RRR no m/r/g Lungs: CTAB, no wheezing, no rhonchi, no crackles, no IWOB on 2L Abdomen: Soft, non-tender to palpation, non-distended, positive bowel sounds Skin: warm and dry Extremities: No edema, 2/5 strength RUE/RLE Neuro: Expressive aphasia, mumbled speech, follows simple commands, was given paper and pen and asked to write something, but did not seem to understand   Laboratory: Recent Labs  Lab 06/01/19 0736 06/02/19 0456 06/03/19 0402  WBC 6.5 7.6 6.4  HGB 11.8* 12.6* 12.2*  HCT 35.6* 37.9* 36.8*  PLT 245 240 252   Recent Labs  Lab 05/31/19 2009  06/01/19 0736 06/02/19 0456 06/03/19 0402  NA 140   < > 143 142 140  K 3.7   < > 3.6 3.6 3.4*  CL 106   < > 108 106 106  CO2 22  --  26 26 26   BUN 11   < > 10 12 15   CREATININE 1.08   < > 1.05 1.06 0.94  CALCIUM 8.9  --  8.8* 8.8* 8.8*  PROT 6.4*  --   --   --   --   BILITOT 1.0  --   --   --   --   ALKPHOS 74  --   --   --   --   ALT 23  --   --   --   --   AST 25  --   --   --   --   GLUCOSE 202*   < > 170* 178* 174*   < > = values in this interval not displayed.     Imaging/Diagnostic Tests: Vas [place] Transcranial Doppler W Bubbles  Result Date: 06/02/2019  Transcranial Doppler with Bubble Indications: Stroke. History: Stroke with known DVT, Apashia, right sided wekness, HTN, HLD, DM II, peripheral neuropathy,CAD with DES. Performing Technologist: Toma Copier RVS  Examination Guidelines: A complete evaluation includes B-mode imaging, spectral Doppler, color Doppler, and power Doppler as needed of all accessible portions of each vessel. Bilateral testing is considered an integral part of a complete examination. Limited examinations for reoccurring indications may be performed as noted.  Summary:  A vascular evaluation was performed. The  right middle cerebral artey was studied. Patient already had an IV placed in the left wrist. No HITS were noted at rest and after two Valsalva maneuvers. No apparent PFO. *See table(s) above for measurements and observations.    Preliminary     Walfred Bettendorf, Bernita Raisin, DO 06/03/2019, 7:46 AM PGY-1, [place] Intern pager: [phone], text pages welcome

## 2019-06-03 NOTE — Care Management Important Message (Signed)
Important Message  Patient Details  Name: Phillp Dolores MRN: [id number] Date of Birth: 10-04-1941   Medicare Important Message Given:  Yes  Due to illness patient is not able to sign. Signed copy left at the patient bedside.  Bharath Bernstein 06/03/2019, 1:24 PM

## 2019-06-03 NOTE — Plan of Care (Signed)
Progressing towards goals

## 2019-06-04 ENCOUNTER — Encounter (HOSPITAL_COMMUNITY): Payer: Self-pay | Admitting: Cardiology

## 2019-06-04 DIAGNOSIS — Z7901 Long term (current) use of anticoagulants: Secondary | ICD-10-CM

## 2019-06-04 LAB — GLUCOSE, CAPILLARY
Glucose-Capillary: 174 mg/dL — ABNORMAL HIGH (ref 70–99)
Glucose-Capillary: 232 mg/dL — ABNORMAL HIGH (ref 70–99)
Glucose-Capillary: 200 mg/dL — ABNORMAL HIGH (ref 70–99)

## 2019-06-04 LAB — BASIC METABOLIC PANEL
Anion gap: 10 (ref 5–15)
BUN: 16 mg/dL (ref 8–23)
CO2: 25 mmol/L (ref 22–32)
Calcium: 9.1 mg/dL (ref 8.9–10.3)
Chloride: 105 mmol/L (ref 98–111)
Creatinine, Ser: 0.79 mg/dL (ref 0.61–1.24)
GFR calc Af Amer: >60 mL/min (ref >60)
GFR calc non Af Amer: >60 mL/min (ref >60)
Glucose, Bld: 165 mg/dL — ABNORMAL HIGH (ref 70–99)
Potassium: 3.5 mmol/L (ref 3.5–5.1)
Sodium: 140 mmol/L (ref 135–145)

## 2019-06-04 LAB — CBC
HCT: 36 % — ABNORMAL LOW (ref 39.0–52.0)
Hemoglobin: 12.1 g/dL — ABNORMAL LOW (ref 13.0–17.0)
MCH: 28.7 pg (ref 26.0–34.0)
MCHC: 33.6 g/dL (ref 30.0–36.0)
MCV: 85.5 fL (ref 80.0–100.0)
Platelets: 238 10*3/uL (ref 150–400)
RBC: 4.21 MIL/uL — ABNORMAL LOW (ref 4.22–5.81)
RDW: 15.1 % (ref 11.5–15.5)
WBC: 6.1 10*3/uL (ref 4.0–10.5)
nRBC: 0 % (ref 0.0–0.2)

## 2019-06-04 MED ORDER — ENSURE ENLIVE PO LIQD
237.0000 mL | Freq: Three times a day (TID) | ORAL | Status: DC
  Administered 2019-06-04 – 2019-06-07 (×8): 237 mL via ORAL
  Filled 2019-06-04: qty 237

## 2019-06-04 MED ORDER — POTASSIUM CHLORIDE CRYS ER 20 MEQ PO TBCR
40.0000 meq | EXTENDED_RELEASE_TABLET | Freq: Once | ORAL | Status: AC
  Administered 2019-06-04: 40 meq via ORAL
  Filled 2019-06-04: qty 2

## 2019-06-04 MED ORDER — ENSURE ENLIVE PO LIQD
237.0000 mL | Freq: Two times a day (BID) | ORAL | Status: DC
  Administered 2019-06-04: 237 mL via ORAL

## 2019-06-04 MED ORDER — MAGNESIUM SULFATE 2 GM/50ML IV SOLN
2.0000 g | Freq: Once | INTRAVENOUS | Status: AC
  Administered 2019-06-04: 2 g via INTRAVENOUS
  Filled 2019-06-04: qty 50

## 2019-06-04 NOTE — Progress Notes (Signed)
Physical Therapy Treatment Patient Details Name: Luke Rivera MRN: [id number] DOB: 1941-10-25 Today's Date: 06/04/2019    History of Present Illness 78 y.o. male presenting with gargled speech and right sided deficits. 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 No intracranial hemorrhage. MRI pending    PT Comments    Pt seen for mobility progression. Pt is making progress toward PT goals and tolerated session well. Pt requires mod A +2 for bed mobility and min A +2 for sit to stand transfers utilizing Stedy standing frame. Pt with some active movement in both R UE/LE when multimodal cues provided.  Continue to recommend CIR for further skilled PT services to maximize independence and safety with mobility.   Follow Up Recommendations  CIR     Equipment Recommendations  Other (comment)(TBD)    Recommendations for Other Services       Precautions / Restrictions Precautions Precautions: Fall Restrictions Weight Bearing Restrictions: No    Mobility  Bed Mobility Overal bed mobility: Needs Assistance Bed Mobility: Rolling;Sidelying to Sit Rolling: Min assist Sidelying to sit: Mod assist;+2 for physical assistance       General bed mobility comments: cues for sequencing and use of bed rail rolling toward R side; assist to bring bilat LE off EOB and to elevate trunk into sitting   Transfers Overall transfer level: Needs assistance   Transfers: Sit to/from Stand Sit to Stand: +2 physical assistance;Min assist;+2 safety/equipment         General transfer comment: cues for LE positioning and assist to place R foot and reach R UE toward Stedy standing frame; assist to power up and to steady  Ambulation/Gait                  Stairs             Wheelchair Mobility    Modified Rankin (Stroke Patients Only) Modified Rankin (Stroke Patients Only) Pre-Morbid Rankin Score: No significant disability Modified Rankin: Severe disability     Balance Overall balance assessment: Needs assistance Sitting-balance support: Feet supported;Bilateral upper extremity supported Sitting balance-Leahy Scale: Fair     Standing balance support: Bilateral upper extremity supported;During functional activity Standing balance-Leahy Scale: Poor Standing balance comment: multimodal cues for hip extension and forward gaze; pt able to maintain standing balance with min guard/min A after R foot repositioned for improved BOS with bilat UE support; pt with L lateral lean                             Cognition Arousal/Alertness: Awake/alert Behavior During Therapy: Flat affect(tends to laugh when having difficulty with speech) Overall Cognitive Status: Difficult to assess                   Orientation Level: (able to state we are at [facility]) Current Attention Level: Sustained   Following Commands: Follows one step commands consistently;Follows one step commands with increased time Safety/Judgement: Decreased awareness of safety   Problem Solving: Requires verbal cues;Requires tactile cues        Exercises      General Comments General comments (skin integrity, edema, etc.): redness at sacrum--NT notified; pt without O2 upon arrival and SpO2 90% at rest on RA  Pertinent Vitals/Pain Pain Assessment: Faces Faces Pain Scale: Hurts little more Pain Location: grimacing with L UE ROM, particularly shoulder Pain Descriptors / Indicators: Discomfort;Grimacing;Guarding Pain Intervention(s): Limited activity within patient's tolerance;Monitored during session;Repositioned    Home Living                      Prior Function            PT Goals (current goals can now be found  in the care plan section) Acute Rehab PT Goals Patient Stated Goal: none stated Progress towards PT goals: Progressing toward goals    Frequency    Min 4X/week      PT Plan Current plan remains appropriate    Co-evaluation PT/OT/SLP Co-Evaluation/Treatment: Yes Reason for Co-Treatment: Complexity of the patient's impairments (multi-system involvement);Necessary to address cognition/behavior during functional activity;For patient/therapist safety;To address functional/ADL transfers PT goals addressed during session: Mobility/safety with mobility;Balance OT goals addressed during session: ADL's and self-care;Proper use of Adaptive equipment and DME;Strengthening/ROM      AM-PAC PT "6 Clicks" Mobility   Outcome Measure  Help needed turning from your back to your side while in a flat bed without using bedrails?: A Little Help needed moving from lying on your back to sitting on the side of a flat bed without using bedrails?: A Lot Help needed moving to and from a bed to a chair (including a wheelchair)?: A Lot Help needed standing up from a chair using your arms (e.g., wheelchair or bedside chair)?: A Lot Help needed to walk in hospital room?: Total Help needed climbing 3-5 steps with a railing? : Total 6 Click Score: 11    End of Session Equipment Utilized During Treatment: Gait belt Activity Tolerance: Patient tolerated treatment well Patient left: in chair;with call bell/phone within reach;with chair alarm set Nurse Communication: Mobility status;Need for lift equipment PT Visit Diagnosis: Unsteadiness on feet (R26.81);Other abnormalities of gait and mobility (R26.89);Muscle weakness (generalized) (M62.81)     Time: 1020-1051 PT Time Calculation (min) (ACUTE ONLY): 31 min  Charges:  $Gait Training: 8-22 mins                     Earney Navy, PTA Acute Rehabilitation Services Pager: [phone] Office: [phone]     Darliss Cheney 06/04/2019, 11:17  AM

## 2019-06-04 NOTE — Progress Notes (Addendum)
Initial Nutrition Assessment   RD working remotely.  DOCUMENTATION CODES:   Obesity unspecified  INTERVENTION:  Provide Ensure Enlive po TID, each supplement provides 350 kcal and 20 grams of protein.  Encourage adequate PO intake.   NUTRITION DIAGNOSIS:   Inadequate oral intake related to dysphagia as evidenced by meal completion < 25%.  GOAL:   Patient will meet greater than or equal to 90% of their needs  MONITOR:   PO intake, Supplement acceptance, Diet advancement, Skin, Weight trends, Labs, I & O's  REASON FOR ASSESSMENT:   Consult Assessment of nutrition requirement/status, Poor PO  ASSESSMENT:   78 y.o. 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  RD unable to obtain pt nutrition history. Pt with expressive aphasia. Pt is currently on a dysphagia 2 diet with thin liquids. Meal completion poor at 10%. RD to order nutritional supplements to aid in caloric and protein needs. Unable to complete Nutrition-Focused physical exam at this time.   Labs and medications reviewed.   Diet Order:   Diet Order            DIET DYS 2 Room service appropriate? Yes; Fluid consistency: Thin  Diet effective now              EDUCATION NEEDS:   Not appropriate for education at this time  Skin:  Skin Assessment: Reviewed RN Assessment  Last BM:  6/26  Height:   Ht Readings from Last 1 Encounters:  05/31/19 6' (1.829 m)    Weight:   Wt Readings from Last 1 Encounters:  05/31/19 122 kg    Ideal Body Weight:  80.9 kg  BMI:  Body mass index is 36.48 kg/m.  Estimated Nutritional Needs:   Kcal:  1850-2050  Protein:  85-100 grams  Fluid:  >/= 1.8 L/day    Corrin Parker, MS, RD, LDN Pager # [phone] After hours/ weekend pager # [phone]

## 2019-06-04 NOTE — Progress Notes (Signed)
Occupational Therapy Treatment Patient Details Name: Luke Rivera MRN: [id number] DOB: Aug 31, 1941 Today's Date: 06/04/2019    History of present illness 78 y.o. male presenting with gargled speech and right sided deficits. 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 No intracranial hemorrhage. MRI pending   OT comments  Pt verbalized "TOm" when asked what he preferred to be called. Pt able to follow simple commands and progress to chair this session. Pt with activation of R UE during session and able to sustain R UE on sara stedy during transfer total +2 Min (A) elevated surface.Pt with hoyer pad placed per RN request.  Pt smiling and sitting in the chair at the end of session.   Follow Up Recommendations  CIR    Equipment Recommendations  Other (comment)(defer to next venue at this time)    Recommendations for Other Services Rehab consult    Precautions / Restrictions Precautions Precautions: Fall Restrictions Weight Bearing Restrictions: No       Mobility Bed Mobility Overal bed mobility: Needs Assistance Bed Mobility: Rolling;Sidelying to Sit Rolling: Min assist Sidelying to sit: Mod assist;+2 for physical assistance       General bed mobility comments: cues for sequencing and use of bed rail rolling toward R side; assist to bring bilat LE off EOB and to elevate trunk into sitting   Transfers Overall transfer level: Needs assistance   Transfers: Sit to/from Stand Sit to Stand: +2 physical assistance;Min assist;+2 safety/equipment         General transfer comment: cues for LE positioning and assist to place R foot and reach R UE toward Stedy standing frame; assist to power up and to steady tactile cue at forehead  to extend trunk and neck    Balance Overall balance assessment: Needs assistance Sitting-balance support: Feet supported;Bilateral upper extremity supported Sitting balance-Leahy Scale: Fair     Standing balance support: Bilateral upper extremity supported;During functional activity Standing balance-Leahy Scale: Poor Standing balance comment: multimodal cues for hip extension and forward gaze; pt able to maintain standing balance with min guard/min A after R foot repositioned for improved BOS with bilat UE support; pt with L lateral lean                            ADL either performed or assessed with clinical judgement   ADL Overall ADL's : Needs assistance/impaired     Grooming: Set up;Sitting Grooming Details (indicate cue type and reason): min cue to wash face and completed iwth L UE             Lower Body Dressing: Total assistance   Toilet Transfer: +2 for physical assistance;Minimal assistance Toilet Transfer Details (indicate cue type and reason): sara stedy with (A) to position R LE ( demonstrate buckle and use of knee plate upon standing) pt shifting weight toward the L side to maintain static standing           General ADL Comments: pt able to sustain static standing in sara stedy with cues for core extension and looking out window as a motivator     Vision       Perception     Praxis      Cognition Arousal/Alertness: Awake/alert Behavior During  Therapy: Flat affect(tends to laugh when having difficulty with speech) Overall Cognitive Status: Difficult to assess                   Orientation Level: (able to state we are at [facility]) Current Attention Level: Sustained   Following Commands: Follows one step commands consistently;Follows one step commands with increased time Safety/Judgement: Decreased awareness of safety   Problem Solving: Requires verbal cues;Requires tactile cues General Comments: pt states several times "why do  you ... unintelligible speech" Pt laughing at therapist throughout session.         Exercises     Shoulder Instructions       General Comments redness noted at buttock sacral area. Staff notifed to check and need for sacral dressing upon return to bed    Pertinent Vitals/ Pain       Pain Assessment: Faces Faces Pain Scale: Hurts little more Pain Location: grimacing with L UE ROM, particularly shoulder Pain Descriptors / Indicators: Discomfort;Grimacing;Guarding Pain Intervention(s): Limited activity within patient's tolerance;Monitored during session;Premedicated before session;Repositioned  Home Living                                          Prior Functioning/Environment              Frequency  Min 3X/week        Progress Toward Goals  OT Goals(current goals can now be found in the care plan section)  Progress towards OT goals: Progressing toward goals  Acute Rehab OT Goals Patient Stated Goal: smiling but unable to verbalize due to communication deficits. pt with garbled speech OT Goal Formulation: With patient Time For Goal Achievement: 06/15/19 Potential to Achieve Goals: Fair ADL Goals Pt Will Perform Grooming: with min assist Pt Will Perform Upper Body Bathing: with min assist Pt Will Perform Lower Body Bathing: with mod assist Pt Will Perform Upper Body Dressing: with min assist Pt Will Perform Lower Body Dressing: with mod assist Pt Will Transfer to Toilet: with mod assist Pt Will Perform Toileting - Clothing Manipulation and hygiene: with mod assist  Plan Discharge plan remains appropriate    Co-evaluation    PT/OT/SLP Co-Evaluation/Treatment: Yes Reason for Co-Treatment: Complexity of the patient's impairments (multi-system involvement);Necessary to address cognition/behavior during functional activity;For patient/therapist safety;To address functional/ADL transfers PT goals addressed during session: Mobility/safety with  mobility;Balance OT goals addressed during session: ADL's and self-care;Proper use of Adaptive equipment and DME;Strengthening/ROM      AM-PAC OT "6 Clicks" Daily Activity     Outcome Measure   Help from another person eating meals?: A Lot Help from another person taking care of personal grooming?: A Lot Help from another person toileting, which includes using toliet, bedpan, or urinal?: A Lot Help from another person bathing (including washing, rinsing, drying)?: A Lot Help from another person to put on and taking off regular upper body clothing?: A Lot Help from another person to put on and taking off regular lower body clothing?: Total 6 Click Score: 11    End of Session Equipment Utilized During Treatment: Gait belt  OT Visit Diagnosis: Unsteadiness on feet (R26.81);Muscle weakness (generalized) (M62.81);Apraxia (R48.2);Hemiplegia and hemiparesis;Pain Hemiplegia - Right/Left: Right Hemiplegia - dominant/non-dominant: Dominant   Activity Tolerance Patient tolerated treatment well   Patient Left in chair;with call bell/phone within reach;with chair alarm set   Nurse Communication Mobility status;Precautions  Time: 1020-1051 OT Time Calculation (min): 31 min  Charges: OT General Charges $OT Visit: 1 Visit OT Treatments $Self Care/Home Management : 8-22 mins   Jeri Modena, OTR/L  Acute Rehabilitation Services Pager: [phone] Office: [phone] .    Jeri Modena 06/04/2019, 11:22 AM

## 2019-06-04 NOTE — Care Management Important Message (Signed)
Important Message  Patient Details  Name: Luke Rivera MRN: [id number] Date of Birth: 10-Dec-1940   Medicare Important Message Given:  Yes     Cato Liburd Montine Circle 06/04/2019, 2:59 PM

## 2019-06-04 NOTE — Progress Notes (Signed)
Family Medicine Teaching Service Daily Progress Note Intern Pager: [phone]  Patient name: Luke Rivera Medical record number: [id number] Date of birth: February 17, 1941 Age: 78 y.o. 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   Left MCA embolic infarct Medically stable for CIR placement, awaiting bed. Remains with right sided hemiparesis and slurred speech.  -Neurology has signed off -Continue Eliquis 5 mg twice daily, Plavix daily -Patient should NOT be on aspirin -PT/OT: CIR -permissive HTN (<220/120), normalize gradually in 5 to 7 days -poor po intake, nutrition following  Possible atrial fibrillation, PVCs Telemetry with occasional PVCs.  Has remote history of A fib per wife. -Continue Eliquis 5 mg twice daily -f/u with outpatient cardiologist at [facility] -will ensure electrolytes repleted to optimal levels -consider loop recorder for PAF  Hypokalemia: Improved K 3.4  Mag 2.0. Replete K -K-Dur 40 mEq x 1 -BMP in AM  Oxygen Requirement Patient continues to be placed on oxygen without desaturations noted in chart.  He was on 2 L this a.m., but weaned to room air prior to examination and was satting at 97% on room air during examination. - wean as tolerated  HTN: BP this a.m. 150/77.  Home meds:Lasix40mg  PRN, Lopressor 12.5mg  BID, losartan 12.5mg  3x/week, Potassium gluconate 99mg  QD - permissive HTN (<220/120), normalize gradually in 5 to 7 days - hold home meds, especially lopressor in the setting of pauses on  telemetry  T2DM with periopheral neuropathy: A1c on admission 7.3.Home meds: Glipizide 10mg  BID, Lantus 40U qHS, Metformin 1000mg  qHS, Gabapentin 600mg  BID.  CBG this a.m. 174 - sSSI - CBGs AC/HS - hold all home meds - hold glipizide on d/c  HLD: Home meds: Atorvastatin 40mg  QD.  LDL 49. 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 fib dosing  GERD:Protonix 40mg  QD  Incidental Pulmonary Nodule: Head CTA notable for an indeterminant 16mm left upper lobe nodule with recommendation for a repeat non-contrast chest CT at 6-12 months.  FEN/GI: Dysphagia 2 diet per SLP recommendations PPx: Eliquis  Disposition: CIR when bed available  Subjective:  Sleeping heavily, hard to awaken and quick to go back to sleep.   Objective: Temp:  [97.6 F (36.4 C)-98.2 F (36.8 C)] 98.1 F (36.7 C) (06/27 0353) Pulse Rate:  [60-70] 70 (06/27 0353) Resp:  [15-18] 18 (06/27 0353) BP: (122-166)/(62-78) 150/77 (06/27 0353) SpO2:  [93 %-97 %] 93 % (06/27 0353) Physical Exam: General: NAD, sleeping Cardiovascular: RRR, no m/r/g, no LE edema Respiratory: CTA BL, normal work of breathing Gastrointestinal: soft, nontender, nondistended Derm: no rashes appreciated Neuro: right sided hemiparesis of upper and lower extremities  Laboratory: Recent Labs  Lab 06/03/19 0402 06/04/19 0420 06/05/19 0344  WBC 6.4 6.1 6.4  HGB 12.2* 12.1* 12.9*  HCT 36.8* 36.0* 38.0*  PLT 252 238 265   Recent Labs  Lab 05/31/19  2009  06/03/19 0402 06/04/19 0420 06/05/19 0344  NA 140   < > 140 140 140  K 3.7   < > 3.4* 3.5 3.4*  CL 106   < > 106 105 103  CO2 22   < > 26 25 25   BUN 11   < > 15 16 16   CREATININE 1.08   < > 0.94 0.79 0.84  CALCIUM  8.9   < > 8.8* 9.1 9.1  PROT 6.4*  --   --   --   --   BILITOT 1.0  --   --   --   --   ALKPHOS 74  --   --   --   --   ALT 23  --   --   --   --   AST 25  --   --   --   --   GLUCOSE 202*   < > 174* 165* 182*   < > = values in this interval not displayed.    Imaging/Diagnostic Tests: No new imagining  Terriana Barreras, [place], DO 06/05/2019, 5:53 AM PGY-2, [place] Intern pager: [phone], text pages welcome

## 2019-06-04 NOTE — Progress Notes (Signed)
  Speech Language Pathology Treatment: Cognitive-Linquistic(Aphasia, Dysarthria)  Patient Details Name: Luke Rivera MRN: [id number] DOB: 02/20/1941 Today's Date: 06/04/2019 Time: 2836-6294 SLP Time Calculation (min) (ACUTE ONLY): 22 min  Assessment / Plan / Recommendation Clinical Impression  Pt was seen for aphasia and dysarthria treatment and was cooperative throughout the session without complaint of pain. He was educated regarding compensatory strategies for speech intelligibility and verbalized understanding regarding them. He used compensatory strategies at the word level with 50% accuracy and at the phrase level with 60% accuracy increasing to 100% accuracy with mod cues for intensity and overarticulation. His verbal output was improved compared to that which was noted yesterday and he exhibited reduced difficulty with word retrieval. He achieved 100% accuracy with confrontational naming of lower frequency words and phrase formulation related to single-action photos. He demonstrated 60% accuracy with responsive naming increasing to 100% accuracy with min. cues. SLP will continue to follow pt.    HPI HPI: Pt is an 78 y.o. male who presented with "gargled speech" and right sided deficits. 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 MRI of the brain revealed acute left MCA infarcts with greatest involvement of the posterior frontal lobe.      SLP Plan  Continue with current plan of care       Recommendations  Diet recommendations: Dysphagia 2 (fine chop);Thin liquid Liquids provided via: Cup;No straw Medication Administration: Crushed with puree Supervision: Staff to assist with self feeding;Full supervision/cueing for compensatory  strategies Compensations: Slow rate;Small sips/bites;Effortful swallow;Multiple dry swallows after each bite/sip Postural Changes and/or Swallow Maneuvers: Seated upright 90 degrees                Oral Care Recommendations: Oral care QID Follow up Recommendations: Inpatient Rehab SLP Visit Diagnosis: Aphasia (R47.01);Dysarthria and anarthria (R47.1) Plan: Continue with current plan of care       Nena Hampe I. Hardin Negus, [place], [place] Office number [phone] Pager [place] 06/04/2019, 10:08 AM

## 2019-06-04 NOTE — Progress Notes (Signed)
  Speech Language Pathology Treatment: Dysphagia  Patient Details Name: Luke Rivera MRN: [id number] DOB: 1941/05/08 Today's Date: 06/04/2019 Time: 2330-0762 SLP Time Calculation (min) (ACUTE ONLY): 15 min  Assessment / Plan / Recommendation Clinical Impression  Pt wanted little POs off his breakfast tray, primarily wanting the liquids. He tried bites of various solids with mastication prolonged and mild right-sided pocketing observed, requiring Min multimodal cueing to clear. Occasional throat clearing was observed during intake, but without eliciting any coughing even when he took larger, consecutive cup sips or when he had mixed consistencies in his mouth. RN said that he has primarily been eating ice cream and applesauce - may wish to consider consult to dieticians to facilitate nutritional intake. Would continue current diet for now with additional SLP f/u recommended for additional advanced trials.   HPI HPI: 78 y.o. male presenting with gargled speech and right sided deficits. 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 No intracranial hemorrhage. MRI pending       SLP Plan  Continue with current plan of care       Recommendations  Diet recommendations: Dysphagia 2 (fine chop);Thin liquid Liquids provided via: Cup;No straw Medication Administration: Crushed with puree Supervision: Staff to assist with self feeding;Full supervision/cueing for compensatory strategies Compensations: Slow rate;Small sips/bites;Effortful swallow;Multiple dry swallows after each bite/sip Postural Changes and/or Swallow Maneuvers: Seated upright 90 degrees                Oral Care Recommendations: Oral care BID Follow up Recommendations:  Inpatient Rehab SLP Visit Diagnosis: Dysphagia, oropharyngeal phase (R13.12) Plan: Continue with current plan of care       GO                Venita Sheffield Sharilynn Cassity 06/04/2019, 9:41 AM  Pollyann Glen, M.A. [place] Acute Environmental education officer [phone] Office [phone]

## 2019-06-04 NOTE — Progress Notes (Signed)
Spoke with cardiology NP Stefani Dama to clarify that patient has been cleared from neuro standpoint and they have signed off.  She notes that <4 second pause that is asymptomatic does not need emergent intervention in the hospital.  Recommend outpatient follow up with primary cardiologist, Dr. Metta Clines and possible loop recorder.  Patient is stable for discharge to CIR when approved and bed available.  Wife called and updated on patient's status.  [place] Constable, D.O.  PGY-1 Family Medicine  06/04/2019 2:12 PM

## 2019-06-04 NOTE — Plan of Care (Signed)
Progressing towards goals

## 2019-06-04 NOTE — Progress Notes (Addendum)
Family Medicine Teaching Service Daily Progress Note Intern Pager: [phone]  Patient name: Luke Rivera Medical record number: [id number] Date of birth: [birth year] Age: 78 y.o. Gender: male  Primary Care Provider: Kristopher Glee., MD Consultants: Neuro Code Status: DNR  Pt Overview and Major Events to Date:  6/22 Admitted to [facility]  Assessment and Plan: Cliford Sequeira is a 78 y.o. 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   Left MCA embolic infarct Remains with right sided hemiparesis and slurred speech.  Medically stable at this time and pending CIR placement. -Neurology has signed off -Frequent neuro checks -Continue Eliquis 5 mg twice daily, Plavix daily -Patient should NOT be on aspirin -PT/OT: CIR, referral placed -permissive HTN (<220/120), normalize gradually in 5 to 7 days - poor po intake, will add ensure BID and consult nutrition  Possible atrial fibrillation, PVCs Telemetry overnight with 2 sec pause and occasional PVCs.  Has remote history of A fib per wife. -Continue Eliquis 5 mg twice daily -Patient has an outpatient cardiologist, would recommend follow-up with them as outpatient -will ensure electrolytes repleted to optimal levels -will consult cardiology while inpatient  Low-Grade Temp Temp 100.5 at 2000 on 6/23.  He has remained afebrile since that time.  WBC 6.4 this a.m. - cont to monitor  Hypokalemia: Improved K3.4>3.5.  Mag 1.7.  Given pauses on telemetry, will replete both again today. -Mag IV 2g -K-Dur 40 mEq x 1 -BMP and Mag in AM  Oxygen Requirement Patient continues to be placed on oxygen without desaturations noted in chart.  He was on 2 L this a.m., but weaned to room air prior to examination and was satting at 97% on room air during  examination. - wean as tolerated  HTN:  BP this a.m. 145/60.  Home meds: Lasix 40mg  PRN, Lopressor 12.5mg  BID, losartan 12.5mg  3x/week, Potassium gluconate 99mg  QD - permissive HTN (<220/120), normalize gradually in 5 to 7 days - hold home meds, especially lopressor in the setting of pauses on telemetry  T2DM with periopheral neuropathy:  A1c on admission 7.3. Home meds: Glipizide 10mg  BID, Lantus 40U qHS, Metformin 1000mg  qHS, Gabapentin 600mg  BID.  CBG this a.m. 174 - sSSI - CBGs AC/HS - hold all home meds - hold glipizide on d/c  HLD: Home meds: Atorvastatin 40mg  QD.  LDL 49. -Restart home atorvastatin, goal LDL less than 70  H/o CAD with two DES (2013): History of two DES in 2nd marginal and mid RCA in 03/2012. 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 fib dosing  GERD: Protonix 40mg  QD  Incidental Pulmonary Nodule: Head CTA notable for an indeterminant 32mm left upper lobe nodule with recommendation for a repeat non-contrast chest CT at 6-12 months.  FEN/GI: Dysphagia 2 diet per SLP recommendations PPx: Eliquis  Disposition: CIR when bed available and cardiac clearance  Subjective:  Continues to remain with expressive aphasia, does not appear to be in pain.  Follows simple commands.  Objective: Temp:  [97.7 F (36.5 C)-98.3 F (36.8 C)] 98.1 F (36.7 C) (06/26 0437) Pulse Rate:  [57-68] 57 (06/26 0437) Resp:  [18] 18 (06/26 0437) BP: (136-156)/(60-72) 145/60 (06/26 0437) SpO2:  [95 %-98 %]  96 % (06/26 0437)  Physical Exam:  General: 78 y.o. male in NAD Cardio: RRR no m/r/g Lungs: CTAB, no wheezing, no rhonchi, no crackles, no IWOB on RA Abdomen: Soft, non-tender to palpation, non-distended, positive bowel sounds Skin: warm and dry Extremities: No edema, right-sided hemiparesis of upper and lower extremities, garbled speech with  expressive aphasia, follows simple commands    Laboratory: Recent Labs  Lab 06/02/19 0456 06/03/19 0402 06/04/19 0420  WBC 7.6 6.4 6.1  HGB 12.6* 12.2* 12.1*  HCT 37.9* 36.8* 36.0*  PLT 240 252 238   Recent Labs  Lab 05/31/19 2009  06/02/19 0456 06/03/19 0402 06/04/19 0420  NA 140   < > 142 140 140  K 3.7   < > 3.6 3.4* 3.5  CL 106   < > 106 106 105  CO2 22   < > 26 26 25   BUN 11   < > 12 15 16   CREATININE 1.08   < > 1.06 0.94 0.79  CALCIUM 8.9   < > 8.8* 8.8* 9.1  PROT 6.4*  --   --   --   --   BILITOT 1.0  --   --   --   --   ALKPHOS 74  --   --   --   --   ALT 23  --   --   --   --   AST 25  --   --   --   --   GLUCOSE 202*   < > 178* 174* 165*   < > = values in this interval not displayed.     Imaging/Diagnostic Tests: No results found.  Toa Mia, Bernita Raisin, DO 06/04/2019, 7:41 AM PGY-1, [place] Intern pager: [phone], text pages welcome

## 2019-06-04 NOTE — Consult Note (Addendum)
Cardiology Consultation:   Patient ID: Luke Rivera; [id number]; December 16, 1940   Admit date: 05/31/2019 Date of Consult: 06/04/2019  Primary Care Provider: Kristopher Rivera., MD Primary Cardiologist: Follows with Dr. Metta Clines, MD with [facility]  Patient Profile:   Luke Rivera is a 78 y.o. 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 HPI obtained per wife as the patient remains aphasic at this time. She reports that prior to this event, he was in his usual state of health and at approximately 7pm on day of presentation, she went to the next room tot talk on the phone. When she returned about 715pm, the patients speech had changed and therefore EMS was called for transport to the ED for further evaluation.   In the ED, Code Stroke was called. Head CT excluded intracranial hemorrhage. CTA was negative as well. Per neurology notes, suspicion of MCA with subsequent breaking up and distal embolization. Not thought to be a candidate for IV tPA. MRI was ordered. He was started on ASA 325. Pt is anticoagulated with Eliquis 2.5mg  BID (DVT), ASA and Plavix (hx of TIA while on Eliquis and ASA).  Per neurology, there was initial concern for possible PFO versus atrial fibrillation.  Echocardiogram with bubble study performed which was negative for PFO.  In talking with family, patient wore monitor in 2018 for unclear reasons. Per chart review in Care Everywhere, it appears that there was no evidence of AF, only brief episodes of tachycardia. ZIO patch  was placed secondary to TIA while on ASA and low dose Eliquis (for DVT). Plavix was added at that time. See below. Neurology questioning history of atrial fibrillation.  On 06/03/2019 primary team called Cardiology with concerns of increasing episodes of PVCs with dropped beats on telemetry review.  Patient was noted to be asymptomatic however there was new concern for possible atrial fibrillation/arrhythmia in the setting of recent left MCA infarct. EKG revealed first-degree AV block with PR interval at 312ms which is not new for him per chart review of cardiology records. Eliquis increased from 2.5 mg twice daily to 5 mg twice daily.  Luke Rivera was last seen by his primary cardiologist on 09/18/2018 for follow up of HTN and CAD. No changes were made in his care. Prior to that he was seen in 12/2017. EKG showed NSR with 1st degree AV with a PR interval at 342ms. On 05/22/2017 he was seen after being dx with a TIA and Plavix was added back to his regimen. Carotid dopplers showed minimal arthrosclerotic disease bilaterally. An echocardiogram was performed which showed a normal LV function and wall motion and no evidence of shunting. Due to TIA on ASA and Eliquis, Plavix was added to regimen. Additionally a ZIO patch was placed to evaluate for possible asymptomatic atrial fibrillation. ZIO patch results showed no AF and a few episodes of tachycardia with no change in medical therapy at that time.    Cardiology has been asked to evaluate given hx of CAD and  possible AF.    Past Medical History:  Diagnosis Date   CAD (coronary artery disease)    Chronic anticoagulation    DM2 (diabetes mellitus, type 2) (HCC)    DVT (deep vein thrombosis) in pregnancy    HTN (hypertension)    TIA (transient ischemic attack)     History reviewed. No pertinent surgical history.   Prior to Admission medications   Medication Sig Start Date End Date Taking? Authorizing Provider  apixaban (ELIQUIS) 5 MG TABS tablet  Take 2.5 mg by mouth 2 (two) times daily.   Yes [provider]  atorvastatin (LIPITOR) 80 MG tablet Take 40 mg by mouth at bedtime.    Yes [provider]  Cholecalciferol (VITAMIN D) 50 MCG (2000 UT) CAPS Take 2,000 Units by mouth 2 (two) times a day.   Yes [provider]  clopidogrel (PLAVIX) 75 MG tablet Take 75 mg by mouth daily.   Yes [provider]  furosemide (LASIX) 40 MG tablet Take 40 mg by mouth daily as needed for fluid or edema.    Yes [provider]  gabapentin (NEURONTIN) 300 MG capsule Take 600 mg by mouth 2 (two) times daily.    Yes [provider]  glipiZIDE (GLUCOTROL) 10 MG tablet Take 10 mg by mouth 2 (two) times daily before a meal.   Yes [provider]  insulin glargine (LANTUS) 100 UNIT/ML injection Inject 30 Units into the skin at bedtime.    Yes [provider]  losartan (COZAAR) 25 MG tablet Take 12.5 mg by mouth every Monday, Wednesday, and Friday.    Yes [provider]  metFORMIN (GLUCOPHAGE) 1000 MG tablet Take 1,000 mg by mouth daily with supper.    Yes [provider]  metoprolol tartrate (LOPRESSOR) 25 MG tablet Take 12.5 mg by mouth 2 (two) times daily.   Yes [provider]  Multiple Vitamin (MULTIVITAMIN WITH MINERALS) TABS tablet Take 1 tablet by mouth daily.   Yes [provider]  nitroGLYCERIN (NITROSTAT) 0.6 MG SL tablet Place 0.6 mg under the tongue as needed for chest pain.   Yes [provider]  nystatin (NYSTATIN) powder Apply 1 g topically daily. Apply to skin fold on abdomen.   Yes [provider]  pantoprazole (PROTONIX) 40 MG tablet Take 40 mg by mouth daily.   Yes [provider]  potassium gluconate (HM POTASSIUM) 595 (99 K) MG TABS tablet Take 595 mg by mouth daily.   Yes [provider]  vitamin B-12 (CYANOCOBALAMIN) 500 MCG tablet Take 500 mcg by mouth every Monday, Wednesday, and Friday.   Yes  [provider]    Inpatient Medications: Scheduled Meds:  apixaban  5 mg Oral BID   atorvastatin  40 mg Oral QHS   clopidogrel  75 mg Oral Daily   feeding supplement (ENSURE ENLIVE)  237 mL Oral BID BM   insulin aspart  0-9 Units Subcutaneous TID WC   pantoprazole  40 mg Oral Daily   Continuous Infusions:  PRN Meds: acetaminophen **OR** acetaminophen (TYLENOL) oral liquid 160 mg/5 mL **OR** acetaminophen  Allergies:   No Known Allergies  Social History:   Social History   Socioeconomic History   Marital status: Married    Spouse name: Not on file   Number of children: Not on file   Years of education: Not on file   Highest education level: Not on file  Occupational History   Not on file  Social Needs  Financial resource strain: Not on file   Food insecurity    Worry: Not on file    Inability: Not on file   Transportation needs    Medical: Not on file    Non-medical: Not on file  Tobacco Use   Smoking status: Former Smoker   Smokeless tobacco: Never Used  Substance and Sexual Activity   Alcohol use: Not on file   Drug use: Not on file   Sexual activity: Not on file  Lifestyle   Physical activity    Days per week: Not on file    Minutes per session: Not on file   Stress: Not on file  Relationships   Social connections    Talks on phone: Not on file    Gets together: Not on file    Attends religious service: Not on file    Active member of club or organization: Not on file    Attends meetings of clubs or organizations: Not on file    Relationship status: Not on file   Intimate partner violence    Fear of current or ex partner: Not on file    Emotionally abused: Not on file    Physically abused: Not on file    Forced sexual activity: Not on file  Other Topics Concern   Not on file  Social History Narrative   Not on file    Family History:   History reviewed. No pertinent family history. Family Status:  No family  status information on file.   ROS:  Please see the history of present illness.  All other ROS reviewed and negative.     Physical Exam/Data:   Vitals:   06/04/19 0437 06/04/19 0759 06/04/19 0900 06/04/19 1204  BP: (!) 145/60 138/69  127/62  Pulse: (!) 57 60  64  Resp: 18 18  18   Temp: 98.1 F (36.7 C) 97.6 F (36.4 C)  97.9 F (36.6 C)  TempSrc: Oral Oral  Oral  SpO2: 96% 96% 97% 93%  Weight:      Height:        Intake/Output Summary (Last 24 hours) at 06/04/2019 1222 Last data filed at 06/04/2019 1100 Gross per 24 hour  Intake 567 ml  Output --  Net 567 ml   Filed Weights   05/31/19 2008 05/31/19 2321  Weight: 125.2 kg 122 kg   Body mass index is 36.48 kg/m.   General: Aphasic, NAD Skin: Warm, dry, intact  Neck: Negative for carotid bruits. No JVD Lungs:Clear to ausculation bilaterally. No wheezes, rales, or rhonchi. Breathing is unlabored. Cardiovascular: RRR with S1 S2. No murmurs, rubs, gallops, or LV heave appreciated. Abdomen: Soft, non-tender, non-distended. No obvious abdominal masses. Extremities: No edema. No clubbing or cyanosis. DP/PT pulses 1+ bilaterally Neuro: Alert and oriented. Unable to communicate  Psych: Opens eye and follows commands   EKG:  The EKG was personally reviewed and demonstrates: 06/03/2019 NSR with first-degree AV block, PR interval 341ms with no acute ischemic changes. AV block noted per primary cardiology notes  Telemetry:  Telemetry was personally reviewed and demonstrates: 06/04/2019 NSR with few episodes of pausing at 2.11 seconds and infrequent PVCs  Relevant CV Studies:  Echocardiogram 06/01/2019:  The left ventricle has normal systolic function, with an ejection fraction of 55-60%. The cavity size was normal. There is mildly increased left ventricular wall thickness. Left ventricular diastolic parameters were normal. No evidence of left  ventricular regional wall motion abnormalities.  2. The right ventricle has normal  systolic  function. The cavity was normal. There is no increase in right ventricular wall thickness.  3. Mild thickening of the mitral valve leaflet. No evidence of mitral valve stenosis.  4. The aortic valve is tricuspid. Mild thickening of the aortic valve. Mild calcification of the aortic valve. No stenosis of the aortic valve.  5. The aortic arch and aortic root are normal in size and structure.  6. No intracardiac thrombi or masses were visualized.  7. The interatrial septum was not well visualized.   Cardiac catheterization 04/02/2012: CORONARY ANGIOGRAPHY:  Vessel SegmentVessel TypeStenosis (%)DescriptionComment 2nd MarginalNative 90 Mid RCANative90 Mid LADNative30   [place] Present: No Coronary Dominance:Left  COMPLICATIONS: None  CONCLUSIONS:   CORONARY STATUS: Obstructive 2 vessel    LV FUNCTION:  Ejection Fraction: 50%   Laboratory Data:  Chemistry Recent Labs  Lab 06/02/19 0456 06/03/19 0402 06/04/19 0420  NA 142 140 140  K 3.6 3.4* 3.5  CL 106 106 105  CO2 26 26 25   GLUCOSE 178* 174* 165*  BUN 12 15 16   CREATININE 1.06 0.94 0.79  CALCIUM 8.8* 8.8* 9.1  GFRNONAA >60 >60 >60  GFRAA >60 >60 >60  ANIONGAP 10 8 10     Total Protein  Date Value Ref Range Status  05/31/2019 6.4 (L) 6.5 - 8.1 g/dL Final   Albumin  Date Value Ref Range Status  05/31/2019 4.0 3.5 - 5.0 g/dL Final   AST  Date Value Ref Range Status  05/31/2019 25 15 - 41 U/L Final   ALT  Date Value Ref Range Status  05/31/2019 23 0 - 44 U/L Final   Alkaline Phosphatase  Date Value Ref Range Status  05/31/2019 74 38 - 126 U/L Final   Total Bilirubin  Date Value Ref Range Status  05/31/2019 1.0 0.3 - 1.2 mg/dL Final   Hematology Recent Labs  Lab 06/02/19 0456 06/03/19 0402 06/04/19 0420  WBC 7.6 6.4 6.1  RBC 4.34 4.26 4.21*  HGB 12.6* 12.2* 12.1*  HCT 37.9*  36.8* 36.0*  MCV 87.3 86.4 85.5  MCH 29.0 28.6 28.7  MCHC 33.2 33.2 33.6  RDW 15.8* 15.3 15.1  PLT 240 252 238   Cardiac EnzymesNo results for input(s): TROPONINI in the last 168 hours. No results for input(s): TROPIPOC in the last 168 hours.  BNPNo results for input(s): BNP, PROBNP in the last 168 hours.  DDimer No results for input(s): DDIMER in the last 168 hours. TSH:  Lab Results  Component Value Date   TSH 0.827 06/01/2019   Lipids: Lab Results  Component Value Date   CHOL 107 06/01/2019   HDL 32 (L) 06/01/2019   LDLCALC 49 06/01/2019   TRIG 128 06/01/2019   CHOLHDL 3.3 06/01/2019   HgbA1c: Lab Results  Component Value Date   HGBA1C 7.3 (H) 06/01/2019    Radiology/Studies:  Ct Angio Head W Or Wo Contrast  Result Date: 05/31/2019 CLINICAL DATA:  Initial evaluation for possible stroke, right-sided deficits. EXAM: CT ANGIOGRAPHY HEAD AND NECK TECHNIQUE: Multidetector CT imaging of the head and neck was performed using the standard protocol during bolus administration of intravenous contrast. Multiplanar CT image reconstructions and MIPs were obtained to evaluate the vascular anatomy. Carotid stenosis measurements (when applicable) are obtained utilizing NASCET criteria, using the distal internal carotid diameter as the denominator. CONTRAST:  52mL OMNIPAQUE IOHEXOL 350 MG/ML SOLN COMPARISON:  None available. FINDINGS: CT HEAD FINDINGS Brain: Generalized age-related cerebral atrophy with mild chronic small vessel ischemic disease. Encephalomalacia involving the anterior left frontal lobe  compatible with chronic anterior left MCA territory infarct. There is question of subtle evolving hypodensity involving the supra ganglionic cortical gray matter, M5 distribution (series 6, image 43). No other definite acute large vessel territory infarct. No acute intracranial hemorrhage. No mass lesion or midline shift. No hydrocephalus. No extra-axial fluid collection. Vascular: No hyperdense  vessel. Calcified atherosclerosis present at skull base. Skull: Scalp soft tissues and calvarium within normal limits. Sinuses/Orbits: Globes and orbital soft tissues within normal limits. Chronic right sphenoid sinus disease. Mucosal thickening noted within the inferior left maxillary sinus. Paranasal sinuses are otherwise clear. Small right mastoid effusion noted. Other: None. ASPECTS [facility] Stroke Program Early CT Score) - Ganglionic level infarction (caudate, lentiform nuclei, internal capsule, insula, M1-M3 cortex): 7 - Supraganglionic infarction (M4-M6 cortex): 2 Total score (0-10 with 10 being normal): 9 Review of the MIP images confirms the above findings CTA NECK FINDINGS Aortic arch: Visualized arch of normal caliber with normal 3 vessel morphology. Mild atheromatous change about the origin of the great vessels without hemodynamically significant stenosis. Visualized subclavian arteries widely patent. Right carotid system: Right common carotid artery patent from its origin to the bifurcation without stenosis. Scattered mixed plaque about the proximal right ICA with associated stenosis of up to 50% by NASCET criteria. Right ICA otherwise widely patent to the skull base. Left carotid system: Left common carotid artery patent from its origin to the bifurcation without flow-limiting stenosis. Eccentric calcified plaque at the origin of the left ICA with associated stenosis of approximately 65-70% by NASCET criteria. Left ICA otherwise widely patent to the skull base. Vertebral arteries: Both of the vertebral arteries arise from the subclavian arteries. Dominant left vertebral artery widely patent within the neck. 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 Distal reconstitution via muscular branches at the distal right V2 segment. Right vertebral patent distally to the skull base without stenosis or other abnormality. Skeleton: No acute osseous finding. No discrete lytic or  blastic osseous lesions. 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 Other neck: No other acute soft tissue abnormality within the neck. Upper chest: Upper lobe predominant emphysema. Superimposed 6 mm left upper lobe nodule (series 5, image 176). Visualized upper chest demonstrates no other acute abnormality. Review of the MIP images confirms the above findings CTA HEAD FINDINGS Anterior circulation: Focal atheromatous plaque within the petrous left ICA with mild stenosis. Petrous right ICA widely patent. Scattered atheromatous plaque within the cavernous/supraclinoid ICAs with mild to moderate multifocal narrowing (no more than 50%). ICA termini well perfused. A1 segments widely patent bilaterally. Normal anterior communicating artery. Anterior cerebral arteries patent to their distal aspects without stenosis. M1 segments demonstrate atheromatous irregularity but are patent bilaterally without flow-limiting stenosis. Normal MCA bifurcations. No proximal M2 occlusion. Focal atherosclerotic plaque with associated severe left M2 stenosis noted (series 9, image 151). Distal MCA branches well perfused and symmetric. Posterior circulation: Dominant left vertebral artery patent as it crosses into the cranial vault. Atheromatous plaque within the mid-distal left V4 segment with associated moderate approximate 50% stenosis. Left PICA patent. Hypoplastic right vertebral artery largely terminates in PICA, although a tiny branch ascending towards the vertebrobasilar junction. Right PICA patent as well. Basilar demonstrates multifocal atheromatous irregularity short-segment mild stenosis noted within the mid basilar. Superior cerebral arteries patent proximally. Left PCA supplied via the basilar. Predominant fetal type origin of the right PCA. PCAs demonstrate scattered atheromatous irregularity but are patent to their distal aspects without flow-limiting stenosis. Venous sinuses: Grossly patent allowing  for timing of the contrast bolus. Anatomic variants: Fetal type origin of the right PCA. No intracranial aneurysm or other vascular abnormality. Delayed phase: Not performed. Review of the MIP images confirms the above findings IMPRESSION: CT HEAD IMPRESSION: 1. Question subtle evolving hypodensity involving the supra ganglionic mid left frontal lobe, suspicious for acute left MCA territory infarct. No intracranial hemorrhage. 2. Aspects equals 9. 3. Underlying chronic anterior left frontal lobe infarct. 4. Age-related cerebral atrophy with mild chronic small vessel ischemic disease. CTA HEAD AND NECK IMPRESSION: 1. Negative CTA for large vessel occlusion. 2. 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 3. Approximate 50% stenosis about the proximal right ICA. 4. 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 Distal reconstitution at the distal right V2 segment. Dominant left vertebral artery widely patent within the neck. 5. 50% atheromatous stenosis involving the mid-distal left V4 segment. 6. Additional diffuse small vessel atheromatous irregularity throughout the intracranial circulation. 7. Emphysema. 8. 6 mm left upper lobe nodule, indeterminate. Non-contrast chest CT at 6-12 months is recommended. If the nodule is stable at time of repeat CT, then future CT at 18-24 months (from today's scan) is considered optional for low-risk patients, but is recommended for high-risk patients. This recommendation follows the consensus statement: Guidelines for Management of Incidental Pulmonary Nodules Detected on CT Images: From the Fleischner Society 2017; Radiology 2017; 284:228-243. Electronically Signed   By: Jeannine Boga M.D.   On: 05/31/2019 21:24   Dg Chest 1 View  Result Date: 06/01/2019 CLINICAL DATA:  Altered mental status today. EXAM: CHEST  1 VIEW COMPARISON:  None. FINDINGS: The lungs are clear. Heart size is normal. No pneumothorax or pleural effusion. No acute or focal  bony abnormality IMPRESSION: Negative chest. Electronically Signed   By: Inge Rise M.D.   On: 06/01/2019 10:29   Dg Abd 1 View  Result Date: 06/01/2019 CLINICAL DATA:  Altered mental status. EXAM: ABDOMEN - 1 VIEW COMPARISON:  None. FINDINGS: The bowel gas pattern is normal. No radio-opaque calculi or other significant radiographic abnormality are seen. Surgical clips right upper quadrant and spinal fusion are noted. IMPRESSION: Negative exam. Electronically Signed   By: Inge Rise M.D.   On: 06/01/2019 10:34   Ct Angio Neck W Or Wo Contrast  Result Date: 05/31/2019 CLINICAL DATA:  Initial evaluation for possible stroke, right-sided deficits. EXAM: CT ANGIOGRAPHY HEAD AND NECK TECHNIQUE: Multidetector CT imaging of the head and neck was performed using the standard protocol during bolus administration of intravenous contrast. Multiplanar CT image reconstructions and MIPs were obtained to evaluate the vascular anatomy. Carotid stenosis measurements (when applicable) are obtained utilizing NASCET criteria, using the distal internal carotid diameter as the denominator. CONTRAST:  26mL OMNIPAQUE IOHEXOL 350 MG/ML SOLN COMPARISON:  None available. FINDINGS: CT HEAD FINDINGS Brain: Generalized age-related cerebral atrophy with mild chronic small vessel ischemic disease. 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 There is question of subtle evolving hypodensity involving the supra ganglionic cortical gray matter, M5 distribution (series 6, image 43). No other definite acute large vessel territory infarct. No acute intracranial hemorrhage. No mass lesion or midline shift. No hydrocephalus. No extra-axial fluid collection. Vascular: No hyperdense vessel. Calcified atherosclerosis present at skull base. Skull: Scalp soft tissues and calvarium within normal limits. Sinuses/Orbits: Globes and orbital soft tissues within normal  limits. Chronic right sphenoid sinus disease. Mucosal thickening noted within the inferior left maxillary sinus. Paranasal sinuses are otherwise clear.  Small right mastoid effusion noted. Other: None. ASPECTS [facility] Stroke Program Early CT Score) - Ganglionic level infarction (caudate, lentiform nuclei, internal capsule, insula, M1-M3 cortex): 7 - Supraganglionic infarction (M4-M6 cortex): 2 Total score (0-10 with 10 being normal): 9 Review of the MIP images confirms the above findings CTA NECK FINDINGS Aortic arch: Visualized arch of normal caliber with normal 3 vessel morphology. Mild atheromatous change about the origin of the great vessels without hemodynamically significant stenosis. Visualized subclavian arteries widely patent. Right carotid system: Right common carotid artery patent from its origin to the bifurcation without stenosis. Scattered mixed plaque about the proximal right ICA with associated stenosis of up to 50% by NASCET criteria. Right ICA otherwise widely patent to the skull base. Left carotid system: Left common carotid artery patent from its origin to the bifurcation without flow-limiting stenosis. Eccentric calcified plaque at the origin of the left ICA with associated stenosis of approximately 65-70% by NASCET criteria. Left ICA otherwise widely patent to the skull base. Vertebral arteries: Both of the vertebral arteries arise from the subclavian arteries. Dominant left vertebral artery widely patent within the neck. 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 Distal reconstitution via muscular branches at the distal right V2 segment. Right vertebral patent distally to the skull base without stenosis or other abnormality. Skeleton: No acute osseous finding. No discrete lytic or blastic osseous lesions. 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 Other neck: No other acute soft tissue abnormality within the neck. Upper chest: Upper lobe  predominant emphysema. Superimposed 6 mm left upper lobe nodule (series 5, image 176). Visualized upper chest demonstrates no other acute abnormality. Review of the MIP images confirms the above findings CTA HEAD FINDINGS Anterior circulation: Focal atheromatous plaque within the petrous left ICA with mild stenosis. Petrous right ICA widely patent. Scattered atheromatous plaque within the cavernous/supraclinoid ICAs with mild to moderate multifocal narrowing (no more than 50%). ICA termini well perfused. A1 segments widely patent bilaterally. Normal anterior communicating artery. Anterior cerebral arteries patent to their distal aspects without stenosis. M1 segments demonstrate atheromatous irregularity but are patent bilaterally without flow-limiting stenosis. Normal MCA bifurcations. No proximal M2 occlusion. Focal atherosclerotic plaque with associated severe left M2 stenosis noted (series 9, image 151). Distal MCA branches well perfused and symmetric. Posterior circulation: Dominant left vertebral artery patent as it crosses into the cranial vault. Atheromatous plaque within the mid-distal left V4 segment with associated moderate approximate 50% stenosis. Left PICA patent. Hypoplastic right vertebral artery largely terminates in PICA, although a tiny branch ascending towards the vertebrobasilar junction. Right PICA patent as well. Basilar demonstrates multifocal atheromatous irregularity short-segment mild stenosis noted within the mid basilar. Superior cerebral arteries patent proximally. Left PCA supplied via the basilar. Predominant fetal type origin of the right PCA. PCAs demonstrate scattered atheromatous irregularity but are patent to their distal aspects without flow-limiting stenosis. Venous sinuses: Grossly patent allowing for timing of the contrast bolus. Anatomic variants: Fetal type origin of the right PCA. No intracranial aneurysm or other vascular abnormality. Delayed phase: Not performed. Review  of the MIP images confirms the above findings IMPRESSION: CT HEAD IMPRESSION: 1. Question subtle evolving hypodensity involving the supra ganglionic mid left frontal lobe, suspicious for acute left MCA territory infarct. No intracranial hemorrhage. 2. Aspects equals 9. 3. Underlying chronic anterior left frontal lobe infarct. 4. Age-related cerebral atrophy with mild chronic small vessel ischemic disease. CTA HEAD AND NECK IMPRESSION: 1. Negative CTA for large vessel occlusion. 2. 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 3. Approximate 50% stenosis about the proximal right ICA. 4. 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 Distal reconstitution at the distal right V2 segment. Dominant left vertebral artery widely patent within the neck. 5. 50% atheromatous stenosis involving the mid-distal left V4 segment. 6. Additional diffuse small vessel atheromatous irregularity throughout the intracranial circulation. 7. Emphysema. 8. 6 mm left upper lobe nodule, indeterminate. Non-contrast chest CT at 6-12 months is recommended. If the nodule is stable at time of repeat CT, then future CT at 18-24 months (from today's scan) is considered optional for low-risk patients, but is recommended for high-risk patients. This recommendation follows the consensus statement: Guidelines for Management of Incidental Pulmonary Nodules Detected on CT Images: From the Fleischner Society 2017; Radiology 2017; 284:228-243. Electronically Signed   By: Jeannine Boga M.D.   On: 05/31/2019 21:24   Mr Brain Wo Contrast  Result Date: 06/01/2019 CLINICAL DATA:  Right-sided neurological deficits. Speech disturbance. EXAM: MRI HEAD WITHOUT CONTRAST TECHNIQUE: Multiplanar, multiecho pulse sequences of the brain and surrounding structures were obtained without intravenous contrast. COMPARISON:  Head CT and CTA 05/31/2019 FINDINGS: The patient was unable to tolerate the complete examination. Sagittal T1 and  coronal T2 sequences were not obtained. Brain: Patchy acute infarcts are present in the left MCA territory with the most confluent area of infarction involving the posterior left frontal lobe including precentral gyrus. Smaller acute infarcts involve left parietal and left temporal lobe cortex as well as left basal ganglia. No intracranial hemorrhage, mass, midline shift, or extra-axial fluid collection is identified. A chronic infarct is noted in the left frontal lobe anterior to the operculum. Mild cerebral atrophy is not greater than expected for age. Punctate chronic infarcts are present in the cerebellum bilaterally. Vascular: Major intracranial vascular flow voids are preserved with the left vertebral artery being dominant. Skull and upper cervical spine: No suspicious marrow lesion. Sinuses/Orbits: Mild mucosal thickening in the paranasal sinuses. Moderate right mastoid effusion. Bilateral cataract extraction. Other: None. IMPRESSION: 1. 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 2. Chronic left frontal infarct. Electronically Signed   By: Logan Bores M.D.   On: 06/01/2019 15:05   Dg Swallowing Func-speech Pathology  Result Date: 06/01/2019 Objective Swallowing Evaluation: Type of Study: MBS-Modified Barium Swallow Study  Patient Details Name: Bosco Paparella MRN: [id number] Date of Birth: 01/22/1941 Today's Date: 06/01/2019 Time: SLP Start Time (ACUTE ONLY): 1335 -SLP Stop Time (ACUTE ONLY): 1352 SLP Time Calculation (min) (ACUTE ONLY): 17 min Past Medical History: No past medical history on file. Past Surgical History: No past surgical history on file. HPI: 78 y.o. male presenting with gargled speech and right sided deficits. 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 CT head revealing Question subtle evolving hypodensity involving the supra ganglionic mid left frontal  lobe, suspicious for acute left MCA territory infarct. No intracranial hemorrhage. MRI pending  No data recorded Assessment / Plan / Recommendation [facility]L IP CLINICAL IMPRESSIONS 06/01/2019 Clinical Impression Pt presents with mild oropharyngeal dysphagia characterized by decreased oral propulsion/impaired mastication and a delay to the vallecular space with thin via tsp; delay to pyriform sinus with nectar consistency also noted; straw sips were attempted with thin/nectar, but unsuccessful d/t decreased mentation (ie: biting on straw despite mod verbal cues); no penetration/aspiration noted during study; pt's mentation decreased overall during exam,so initiation of  a conservative diet of Dysphagia 2/thin via tsp recommended with ST f/u for diet tolerance and advancement. SLP Visit Diagnosis Dysphagia, oropharyngeal phase (R13.12);Aphasia (R47.01) Attention and concentration deficit following -- Frontal lobe and executive function deficit following -- Impact on safety and function Mild aspiration risk   [facility] IP TREATMENT RECOMMENDATION 06/01/2019 Treatment Recommendations Therapy as outlined in treatment plan below   Prognosis 06/01/2019 Prognosis for Safe Diet Advancement Good Barriers to Reach Goals Language deficits Barriers/Prognosis Comment -- [facility] IP DIET RECOMMENDATION 06/01/2019 SLP Diet Recommendations Dysphagia 2 (Fine chop) solids;Thin liquid;Other (Comment) Liquid Administration via No straw;Cup;Other (Comment) Medication Administration Crushed with puree Compensations Slow rate;Small sips/bites;Effortful swallow;Multiple dry swallows after each bite/sip Postural Changes Seated upright at 90 degrees   CHL IP OTHER RECOMMENDATIONS 06/01/2019 Recommended Consults -- Oral Care Recommendations Oral care BID Other Recommendations --   [facility] IP FOLLOW UP RECOMMENDATIONS 06/01/2019 Follow up Recommendations Other (comment)   [facility] IP FREQUENCY AND DURATION 06/01/2019 Speech Therapy Frequency (ACUTE ONLY) min 2x/week Treatment  Duration 1 week      CHL IP ORAL PHASE 06/01/2019 Oral Phase Impaired Oral - Pudding Teaspoon -- Oral - Pudding Cup -- Oral - Honey Teaspoon -- Oral - Honey Cup -- Oral - Nectar Teaspoon -- Oral - Nectar Cup -- Oral - Nectar Straw -- Oral - Thin Teaspoon -- Oral - Thin Cup -- Oral - Thin Straw -- Oral - Puree -- Oral - Mech Soft Impaired mastication Oral - Regular -- Oral - Multi-Consistency -- Oral - Pill -- Oral Phase - Comment --  [facility] IP PHARYNGEAL PHASE 06/01/2019 Pharyngeal Phase Impaired Pharyngeal- Pudding Teaspoon -- Pharyngeal -- Pharyngeal- Pudding Cup -- Pharyngeal -- Pharyngeal- Honey Teaspoon -- Pharyngeal -- Pharyngeal- Honey Cup -- Pharyngeal -- Pharyngeal- Nectar Teaspoon Delayed swallow initiation-pyriform sinuses Pharyngeal -- Pharyngeal- Nectar Cup -- Pharyngeal -- Pharyngeal- Nectar Straw -- Pharyngeal -- Pharyngeal- Thin Teaspoon Delayed swallow initiation-vallecula Pharyngeal -- Pharyngeal- Thin Cup -- Pharyngeal -- Pharyngeal- Thin Straw -- Pharyngeal -- Pharyngeal- Puree Other (Comment) Pharyngeal -- Pharyngeal- Mechanical Soft Other (Comment) Pharyngeal -- Pharyngeal- Regular -- Pharyngeal -- Pharyngeal- Multi-consistency -- Pharyngeal -- Pharyngeal- Pill -- Pharyngeal -- Pharyngeal Comment --  [facility]L IP CERVICAL ESOPHAGEAL PHASE 06/01/2019 Cervical Esophageal Phase WFL Pudding Teaspoon -- Pudding Cup -- Honey Teaspoon -- Honey Cup -- Nectar Teaspoon -- Nectar Cup -- Nectar Straw -- Thin Teaspoon -- Thin Cup -- Thin Straw -- Puree -- Mechanical Soft -- Regular -- Multi-consistency -- Pill -- Cervical Esophageal Comment -- Elvina Sidle, M.S., CCC-SLP 06/01/2019, 2:39 PM              Vas [place] Transcranial Doppler W Bubbles  Result Date: 06/04/2019  Transcranial Doppler with Bubble Indications: Stroke. History: Stroke with known DVT, Apashia, right sided wekness, HTN, HLD, DM II, peripheral neuropathy,CAD with DES. Performing Technologist: Toma Copier RVS  Examination Guidelines: A complete  evaluation includes B-mode imaging, spectral Doppler, color Doppler, and power Doppler as needed of all accessible portions of each vessel. Bilateral testing is considered an integral part of a complete examination. Limited examinations for reoccurring indications may be performed as noted.  Summary:  A vascular evaluation was performed. The right middle cerebral artey was studied. Patient already had an IV placed in the left wrist. No HITS were noted at rest and after two Valsalva maneuvers. No apparent PFO. *See table(s) above for measurements and observations.  Diagnosing physician: Antony Contras MD Electronically signed by Antony Contras MD on 06/04/2019 at 11:46:08 AM.  Final    Ct Head Code Stroke Wo Contrast  Result Date: 05/31/2019 CLINICAL DATA:  Initial evaluation for possible stroke, right-sided deficits. EXAM: CT ANGIOGRAPHY HEAD AND NECK TECHNIQUE: Multidetector CT imaging of the head and neck was performed using the standard protocol during bolus administration of intravenous contrast. Multiplanar CT image reconstructions and MIPs were obtained to evaluate the vascular anatomy. Carotid stenosis measurements (when applicable) are obtained utilizing NASCET criteria, using the distal internal carotid diameter as the denominator. CONTRAST:  27mL OMNIPAQUE IOHEXOL 350 MG/ML SOLN COMPARISON:  None available. FINDINGS: CT HEAD FINDINGS Brain: Generalized age-related cerebral atrophy with mild chronic small vessel ischemic disease. 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 There is question of subtle evolving hypodensity involving the supra ganglionic cortical gray matter, M5 distribution (series 6, image 43). No other definite acute large vessel territory infarct. No acute intracranial hemorrhage. No mass lesion or midline shift. No hydrocephalus. No extra-axial fluid collection. Vascular: No hyperdense vessel. Calcified  atherosclerosis present at skull base. Skull: Scalp soft tissues and calvarium within normal limits. Sinuses/Orbits: Globes and orbital soft tissues within normal limits. Chronic right sphenoid sinus disease. Mucosal thickening noted within the inferior left maxillary sinus. Paranasal sinuses are otherwise clear. Small right mastoid effusion noted. Other: None. ASPECTS [facility] Stroke Program Early CT Score) - Ganglionic level infarction (caudate, lentiform nuclei, internal capsule, insula, M1-M3 cortex): 7 - Supraganglionic infarction (M4-M6 cortex): 2 Total score (0-10 with 10 being normal): 9 Review of the MIP images confirms the above findings CTA NECK FINDINGS Aortic arch: Visualized arch of normal caliber with normal 3 vessel morphology. Mild atheromatous change about the origin of the great vessels without hemodynamically significant stenosis. Visualized subclavian arteries widely patent. Right carotid system: Right common carotid artery patent from its origin to the bifurcation without stenosis. Scattered mixed plaque about the proximal right ICA with associated stenosis of up to 50% by NASCET criteria. Right ICA otherwise widely patent to the skull base. Left carotid system: Left common carotid artery patent from its origin to the bifurcation without flow-limiting stenosis. Eccentric calcified plaque at the origin of the left ICA with associated stenosis of approximately 65-70% by NASCET criteria. Left ICA otherwise widely patent to the skull base. Vertebral arteries: Both of the vertebral arteries arise from the subclavian arteries. Dominant left vertebral artery widely patent within the neck. 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 Distal reconstitution via muscular branches at the distal right V2 segment. Right vertebral patent distally to the skull base without stenosis or other abnormality. Skeleton: No acute osseous finding. No discrete lytic or blastic osseous  lesions. 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 Other neck: No other acute soft tissue abnormality within the neck. Upper chest: Upper lobe predominant emphysema. Superimposed 6 mm left upper lobe nodule (series 5, image 176). Visualized upper chest demonstrates no other acute abnormality. Review of the MIP images confirms the above findings CTA HEAD FINDINGS Anterior circulation: Focal atheromatous plaque within the petrous left ICA with mild stenosis. Petrous right ICA widely patent. Scattered atheromatous plaque within the cavernous/supraclinoid ICAs with mild to moderate multifocal narrowing (no more than 50%). ICA termini well perfused. A1 segments widely patent bilaterally. Normal anterior communicating artery. Anterior cerebral arteries patent to their distal aspects without stenosis. M1 segments demonstrate atheromatous irregularity but are patent bilaterally without flow-limiting stenosis. Normal MCA bifurcations. No proximal M2 occlusion. Focal atherosclerotic plaque with associated severe left M2 stenosis  noted (series 9, image 151). Distal MCA branches well perfused and symmetric. Posterior circulation: Dominant left vertebral artery patent as it crosses into the cranial vault. Atheromatous plaque within the mid-distal left V4 segment with associated moderate approximate 50% stenosis. Left PICA patent. Hypoplastic right vertebral artery largely terminates in PICA, although a tiny branch ascending towards the vertebrobasilar junction. Right PICA patent as well. Basilar demonstrates multifocal atheromatous irregularity short-segment mild stenosis noted within the mid basilar. Superior cerebral arteries patent proximally. Left PCA supplied via the basilar. Predominant fetal type origin of the right PCA. PCAs demonstrate scattered atheromatous irregularity but are patent to their distal aspects without flow-limiting stenosis. Venous sinuses: Grossly patent allowing for timing of the  contrast bolus. Anatomic variants: Fetal type origin of the right PCA. No intracranial aneurysm or other vascular abnormality. Delayed phase: Not performed. Review of the MIP images confirms the above findings IMPRESSION: CT HEAD IMPRESSION: 1. Question subtle evolving hypodensity involving the supra ganglionic mid left frontal lobe, suspicious for acute left MCA territory infarct. No intracranial hemorrhage. 2. Aspects equals 9. 3. Underlying chronic anterior left frontal lobe infarct. 4. Age-related cerebral atrophy with mild chronic small vessel ischemic disease. CTA HEAD AND NECK IMPRESSION: 1. Negative CTA for large vessel occlusion. 2. 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 3. Approximate 50% stenosis about the proximal right ICA. 4. 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 Distal reconstitution at the distal right V2 segment. Dominant left vertebral artery widely patent within the neck. 5. 50% atheromatous stenosis involving the mid-distal left V4 segment. 6. Additional diffuse small vessel atheromatous irregularity throughout the intracranial circulation. 7. Emphysema. 8. 6 mm left upper lobe nodule, indeterminate. Non-contrast chest CT at 6-12 months is recommended. If the nodule is stable at time of repeat CT, then future CT at 18-24 months (from today's scan) is considered optional for low-risk patients, but is recommended for high-risk patients. This recommendation follows the consensus statement: Guidelines for Management of Incidental Pulmonary Nodules Detected on CT Images: From the Fleischner Society 2017; Radiology 2017; 284:228-243. Electronically Signed   By: Jeannine Boga M.D.   On: 05/31/2019 21:24   Assessment and Plan:   1.  NSR with infrequent PVCs and brief pausing per telemetry:  -Patient presented to [facility] on 05/31/2019 with slurred speech found to have a left MCA stroke per MRI followed by neurology/IM teams -Etiology unknown in which an  echocardiogram with bubble study performed 06/01/2019 to rule out PFO which was found to be negative for PFO.  LV function was normal with no evidence of valvular disease -Cardiology asked to evaluate for the possibility of AF as etiology for CVA -Per telemetry review, there is no evidence of AF with several brief pauses, no greater than 2.11 seconds in duration and infrequent PVCs -Pt is currently anticoagulated with Eliquis (home dose was 2.5mg  BID) for hx of unprovoked DVT and ASA. He was placed on Plavix by his primary cardiologist secondary to TIA while anticoagulated. He wore a ZIO patch at that time with no evidence of AF, only tachycardia.  -Would continue current workup per neurology and IM for now -He is currently in NSR with rate control  -Mg+ found to be 1.7>>>would give supplemental Mg+ given PVCs -TSH was normal at 0.827 -Hold lopressor for now and monitor for recurrent pausing on telemetry -Continue Eliquis 5mg  PO BID and Plavix -Could consider OP loop placement arranged per primary cardiology team at [facility]  2. Acute left MCA/CVA: -CTA  head/neck with suspicion for LMCA infarct and underlying chronic anterior left frontal lobe infarct and small vessel ischemic disease.  Left ICA noted to be 65 to 75% stenosis, right ICA 50% stenosis -Neurology felt not a candidate for TPA and recommended holding anticoagulation for MRI -Left MCA infarct embolic secondary to unknown source, possible PFO given current DVT versus other cardioembolic source i.e. atrial fibrillation most likely -Echocardiogram with bubble for possible PFO which was found to be negative  -There is a questionable history of atrial fibrillation per family report>>ZIO patch worn in 2018 with no evidence of AF. Worn in the setting of TIA>>placed on Plavix  -Hemoglobin A1c, 7.3 -Continue ASA 325 mg daily -Per primary team, neurology  3.  Hypertension: -Stable, 127/62>138/69>145/60 -Allow permissive hypertension -On home  Lasix 40 mg as needed, metoprolol 12.5 mg MWF, losartan 12.5 mg 3 times per week -Antihypertensives currently on hold  4.  DM2 with peripheral neuropathy: -Last hemoglobin A1c 6.9 in 2018, most recent hemoglobin A1c this admission, 7.3 with goal of <7.0 -SSI for glucose control while inpatient status -Home medications currently on hold -Per primary team  5.  HLD: -Last LDL, 49 with goal of <70 -Continue atorvastatin 40 mg daily  6.  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 medication Eliquis 2.5 mg increased to 5mg  PO BID secondary to difficulty swallowing -Plavix restarted    For questions or updates, please contact [place] Please consult [email or web address] for contact info under Cardiology/STEMI.   Signed, Kathyrn Drown NP-C [facility] Pager: [phone] 06/04/2019 12:22 PM  I have examined the patient and reviewed assessment and plan and discussed with patient.  Agree with above as stated.    Stroke with significant neuro deficits.  Agree with increasing dose of Eliquis.  Consider loop recorder to check on PAF presence.  He follows at [facility] for cardiac care.   Watch for bleeding issues given Plavix and ELiquis use.   Larae Grooms

## 2019-06-05 LAB — CBC
HCT: 38 % — ABNORMAL LOW (ref 39.0–52.0)
Hemoglobin: 12.9 g/dL — ABNORMAL LOW (ref 13.0–17.0)
MCH: 28.5 pg (ref 26.0–34.0)
MCHC: 33.9 g/dL (ref 30.0–36.0)
MCV: 84.1 fL (ref 80.0–100.0)
Platelets: 265 10*3/uL (ref 150–400)
RBC: 4.52 MIL/uL (ref 4.22–5.81)
RDW: 15 % (ref 11.5–15.5)
WBC: 6.4 10*3/uL (ref 4.0–10.5)
nRBC: 0 % (ref 0.0–0.2)

## 2019-06-05 LAB — BASIC METABOLIC PANEL
Anion gap: 12 (ref 5–15)
BUN: 16 mg/dL (ref 8–23)
CO2: 25 mmol/L (ref 22–32)
Calcium: 9.1 mg/dL (ref 8.9–10.3)
Chloride: 103 mmol/L (ref 98–111)
Creatinine, Ser: 0.84 mg/dL (ref 0.61–1.24)
GFR calc Af Amer: >60 mL/min (ref >60)
GFR calc non Af Amer: >60 mL/min (ref >60)
Glucose, Bld: 182 mg/dL — ABNORMAL HIGH (ref 70–99)
Potassium: 3.4 mmol/L — ABNORMAL LOW (ref 3.5–5.1)
Sodium: 140 mmol/L (ref 135–145)

## 2019-06-05 LAB — GLUCOSE, CAPILLARY
Glucose-Capillary: 164 mg/dL — ABNORMAL HIGH (ref 70–99)
Glucose-Capillary: 269 mg/dL — ABNORMAL HIGH (ref 70–99)
Glucose-Capillary: 240 mg/dL — ABNORMAL HIGH (ref 70–99)
Glucose-Capillary: 199 mg/dL — ABNORMAL HIGH (ref 70–99)

## 2019-06-05 LAB — MAGNESIUM: Magnesium: 2 mg/dL (ref 1.7–2.4)

## 2019-06-05 MED ORDER — POTASSIUM CHLORIDE CRYS ER 20 MEQ PO TBCR
40.0000 meq | EXTENDED_RELEASE_TABLET | Freq: Once | ORAL | Status: AC
  Administered 2019-06-05: 40 meq via ORAL
  Filled 2019-06-05: qty 2

## 2019-06-05 NOTE — Progress Notes (Signed)
[facility] will sign off.   Medication Recommendations: see note 06/04/19 Other recommendations (labs, testing, etc):  As per primary cardiologist Follow up as an outpatient:  With primary cardiologist

## 2019-06-06 DIAGNOSIS — R4182 Altered mental status, unspecified: Secondary | ICD-10-CM

## 2019-06-06 DIAGNOSIS — R410 Disorientation, unspecified: Secondary | ICD-10-CM

## 2019-06-06 LAB — GLUCOSE, CAPILLARY
Glucose-Capillary: 187 mg/dL — ABNORMAL HIGH (ref 70–99)
Glucose-Capillary: 231 mg/dL — ABNORMAL HIGH (ref 70–99)
Glucose-Capillary: 259 mg/dL — ABNORMAL HIGH (ref 70–99)
Glucose-Capillary: 255 mg/dL — ABNORMAL HIGH (ref 70–99)

## 2019-06-06 LAB — BASIC METABOLIC PANEL
Anion gap: 12 (ref 5–15)
BUN: 17 mg/dL (ref 8–23)
CO2: 24 mmol/L (ref 22–32)
Calcium: 9 mg/dL (ref 8.9–10.3)
Chloride: 103 mmol/L (ref 98–111)
Creatinine, Ser: 0.93 mg/dL (ref 0.61–1.24)
GFR calc Af Amer: >60 mL/min (ref >60)
GFR calc non Af Amer: >60 mL/min (ref >60)
Glucose, Bld: 213 mg/dL — ABNORMAL HIGH (ref 70–99)
Potassium: 3.7 mmol/L (ref 3.5–5.1)
Sodium: 139 mmol/L (ref 135–145)

## 2019-06-06 NOTE — Progress Notes (Signed)
Physical Therapy Treatment Patient Details Name: Luke Rivera MRN: [id number] DOB: 1941/04/27 Today's Date: 06/06/2019    History of Present Illness 78 y.o. male presenting with gargled speech and right sided deficits. 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 No intracranial hemorrhage. MRI pending    PT Comments    Patient seen for mobility progression. This session focused on functional transfer training. Pt requires mod A +2 with use of RW and min A +2 with Stedy standing frame for sit to stand transfers. Pt did not attempt to verbalize as much this session despite cues but follows single step commands consistently. Continue to progress as tolerated.     Follow Up Recommendations  CIR     Equipment Recommendations  Other (comment)(TBD)    Recommendations for Other Services       Precautions / Restrictions Precautions Precautions: Fall Precaution Comments: R side inattention Restrictions Weight Bearing Restrictions: No    Mobility  Bed Mobility Overal bed mobility: Needs Assistance Bed Mobility: Rolling;Sidelying to Sit Rolling: Min guard Sidelying to sit: Mod assist;HOB elevated       General bed mobility comments: assist to bring R LE from EOB, scoot hips to EOB, and to elevate trunk into sitting; cues and assistance to use R UE to push self up into sitting; use of rail   Transfers Overall transfer level: Needs assistance Equipment used: Rolling walker (2 wheeled) Transfers: Sit to/from Stand Sit to Stand: +2 physical assistance;Min assist;+2 safety/equipment;Mod assist         General transfer comment: pt stood X 2 trials from EOB and X 1 from [place] seat; cues for safe hand placement and hand over hand  assist for R UE grip on RW; assist to power up into standing; pt unable to pivot or pick up feet so Stedy standing frame utilized for transfer to recliner; pt is able to stand with min A +2 using standing frame   Ambulation/Gait             General Gait Details: unable   Chief Strategy Officer    Modified Rankin (Stroke Patients Only) Modified Rankin (Stroke Patients Only) Pre-Morbid Rankin Score: No significant disability Modified Rankin: Severe disability     Balance Overall balance assessment: Needs assistance Sitting-balance support: Feet supported;Bilateral upper extremity supported Sitting balance-Leahy Scale: Fair     Standing balance support: Bilateral upper extremity supported;During functional activity Standing balance-Leahy Scale: Poor Standing balance comment: multimodal cues for hip extension and forward gaze                            Cognition Arousal/Alertness: Awake/alert Behavior During Therapy: Flat affect Overall Cognitive Status: Difficult to assess                     Current Attention Level: Sustained   Following Commands: Follows one step commands consistently;Follows one step commands with increased time     Problem Solving: Requires verbal cues;Requires tactile cues General Comments: pt repeating "wow" for every response this session and did not attempt to verbalize as much as previous session on 6/26 however does follow single step commands  Exercises      General Comments        Pertinent Vitals/Pain Pain Assessment: Faces Faces Pain Scale: No hurt    Home Living                      Prior Function            PT Goals (current goals can now be found in the care plan section) Progress towards PT goals: Progressing toward goals    Frequency    Min 4X/week      PT Plan Current plan remains appropriate    Co-evaluation              AM-PAC PT "6 Clicks"  Mobility   Outcome Measure  Help needed turning from your back to your side while in a flat bed without using bedrails?: A Little Help needed moving from lying on your back to sitting on the side of a flat bed without using bedrails?: A Lot Help needed moving to and from a bed to a chair (including a wheelchair)?: A Lot Help needed standing up from a chair using your arms (e.g., wheelchair or bedside chair)?: A Lot Help needed to walk in hospital room?: Total Help needed climbing 3-5 steps with a railing? : Total 6 Click Score: 11    End of Session Equipment Utilized During Treatment: Gait belt Activity Tolerance: Patient tolerated treatment well Patient left: in chair;with call bell/phone within reach;with chair alarm set Nurse Communication: Mobility status;Need for lift equipment PT Visit Diagnosis: Unsteadiness on feet (R26.81);Other abnormalities of gait and mobility (R26.89);Muscle weakness (generalized) (M62.81)     Time: 5208-0223 PT Time Calculation (min) (ACUTE ONLY): 29 min  Charges:  $Gait Training: 8-22 mins $Therapeutic Activity: 8-22 mins                     Earney Navy, PTA Acute Rehabilitation Services Pager: [phone] Office: [phone]     Darliss Cheney 06/06/2019, 1:53 PM

## 2019-06-06 NOTE — Progress Notes (Signed)
  Speech Language Pathology Treatment: Dysphagia  Patient Details Name: Luke Rivera MRN: [id number] DOB: 1941-10-26 Today's Date: 06/06/2019 Time: 5573-2202 SLP Time Calculation (min) (ACUTE ONLY): 8 min  Assessment / Plan / Recommendation Clinical Impression  RN reports poor intake and oral holding with meal tray.  Today pt accepted three trials of soft solid (graham cracker with applesauce) with no overt s/s of aspiration and achieved adequate oral clearance after prolonged mastication prior to refusing additional trials.  Pt tolerated thin liquid by cup with no clinical s/s of aspiration.  With single trial of ground/chopped consistency from meal tray, pt exhibited extremely prolonged mastication, anterior loss from R side of oral cavity, and declined further trials.  RN reports that pt declined purees with meal (mashed potatoes) as well.  Pt appears to find meal tray unappetizing, but given difficulty of oral transit with current diet consistency advancement is not advised.  Pt appears to prefer sweet foods to savory and this may also be impacting performance with meals.    Recommend continuing ground/chopped texture diet with thin liquid by cup.    HPI HPI: Pt is an 78 y.o. male who presented with "gargled speech" and right sided deficits. 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 MRI of the brain revealed acute left MCA infarcts with greatest involvement of the posterior frontal lobe.      SLP Plan  Continue with current plan of care       Recommendations  Diet recommendations: Dysphagia 2 (fine chop) Liquids provided via: Cup Medication Administration: Crushed with puree Supervision: Full supervision/cueing for compensatory  strategies Compensations: Slow rate;Small sips/bites;Lingual sweep for clearance of pocketing;Follow solids with liquid Postural Changes and/or Swallow Maneuvers: Seated upright 90 degrees                Oral Care Recommendations: Oral care BID SLP Visit Diagnosis: Dysphagia, oropharyngeal phase (R13.12) Plan: Continue with current plan of care       [place], [place], [place] Office: [phone]; Pager (6/28): [phone] 06/06/2019, 4:13 PM

## 2019-06-06 NOTE — Progress Notes (Signed)
Family Medicine Teaching Service Daily Progress Note Intern Pager: [phone]  Patient name: Luke Rivera Medical record number: [id number] Date of birth: 07-Mar-1941 Age: 78 y.o. 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   Left MCA embolic infarct Medically stable for CIR placement, awaiting bed and insurance approval. Remains with right sided hemiparesis and slurred speech.  -Neurology and cardiology have signed off -Continue Eliquis 5 mg twice daily, Plavix daily -Patient should NOT be on aspirin -PT/OT: CIR -permissive HTN (<220/120), normalize gradually in 5 to 7 days -poor po intake, nutrition following  Possible atrial fibrillation, PVCs Telemetry with occasional PVCs.  Has remote history of A fib per wife. -Continue Eliquis 5 mg twice daily -f/u with outpatient cardiologist at [facility] -will ensure electrolytes repleted to optimal levels -consider loop recorder for PAF  Hypokalemia: Improved K 3.4 on 6/28, labs pending this am.  Mag 2.0. Replete K prn -BMP in AM  HTN: BP this a.m. 118/56.  Home meds:Lasix40mg  PRN, Lopressor 12.5mg  BID, losartan 12.5mg  3x/week, Potassium gluconate 99mg  QD - permissive HTN (<220/120), normalize gradually in 5 to 7 days - hold home meds, especially lopressor in the setting of pauses on telemetry  T2DM with periopheral neuropathy: A1c on admission 7.3.Home meds: Glipizide 10mg  BID, Lantus 40U qHS, Metformin 1000mg  qHS, Gabapentin 600mg  BID.  CBG this a.m. 164-269 - sSSI - CBGs  AC/HS - hold all home meds - hold glipizide on d/c  HLD: Home meds: Atorvastatin 40mg  QD.  LDL 49. 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 fib dosing  GERD:Protonix 40mg  QD  Incidental Pulmonary Nodule: Head CTA notable for an indeterminant 36mm left upper lobe nodule with recommendation for a repeat non-contrast chest CT at 6-12 months.  FEN/GI: Dysphagia 2 diet per SLP recommendations PPx: Eliquis  Disposition: CIR when bed available  Subjective:  Sleeping heavily and again hard to awaken.  Once awoken patient states that he does not have any concerns at this time.  Objective: Temp:  [97.8 F (36.6 C)-99.1 F (37.3 C)] 97.9 F (36.6 C) (06/28 0421) Pulse Rate:  [67-87] 72 (06/28 0421) Resp:  [15-20] 19 (06/28 0421) BP: (117-164)/(51-71) 118/56 (06/28 0421) SpO2:  [91 %-96 %] 96 % (06/28 0421) Physical Exam: General: NAD, pleasant Cardiovascular: RRR, no m/r/g, no LE edema Respiratory: CTA BL, normal work of breathing Gastrointestinal: soft, nontender, nondistended Derm: no rashes appreciated, chronic venous stasis changes of BL LE Neuro: right sided hemiparesis of upper and lower extremities  Laboratory: Recent Labs  Lab 06/03/19 0402 06/04/19 0420 06/05/19 0344  WBC 6.4 6.1 6.4  HGB 12.2* 12.1* 12.9*  HCT 36.8* 36.0* 38.0*  PLT 252 238 265   Recent Labs  Lab 05/31/19 2009  06/03/19 0402 06/04/19 0420 06/05/19 0344  NA 140   < > 140 140 140  K 3.7   < >  3.4* 3.5 3.4*  CL 106   < > 106 105 103  CO2 22   < > 26 25 25   BUN 11   < > 15 16 16   CREATININE 1.08   < > 0.94 0.79 0.84  CALCIUM 8.9   < > 8.8* 9.1 9.1  PROT 6.4*  --   --   --   --   BILITOT 1.0  --   --   --   --   ALKPHOS 74   --   --   --   --   ALT 23  --   --   --   --   AST 25  --   --   --   --   GLUCOSE 202*   < > 174* 165* 182*   < > = values in this interval not displayed.    Imaging/Diagnostic Tests: No new imagining  [name], [place], DO 06/06/2019, 5:59 AM PGY-2, [place] Intern pager: [phone], text pages welcome

## 2019-06-06 NOTE — Progress Notes (Signed)
Wife called and updated on plan of care.  She notes that patient's glasses were returned to the floor yesterday and she would like to make sure that the nurse received them.  He would also like for him to have them on.  Called nurse and advised her of this.  Also asked nurse to DC condom catheter.  [place] Constable, D.O.  PGY-1 Family Medicine  06/06/2019 3:11 PM

## 2019-06-07 ENCOUNTER — Other Ambulatory Visit: Payer: Self-pay

## 2019-06-07 ENCOUNTER — Encounter (HOSPITAL_COMMUNITY): Payer: Self-pay

## 2019-06-07 ENCOUNTER — Inpatient Hospital Stay (HOSPITAL_COMMUNITY)
Admission: RE | Admit: 2019-06-07 | Discharge: 2019-07-02 | DRG: 057 | Disposition: A | Discharge status: Home Health | Payer: Medicare HMO | Source: Intra-hospital | Attending: Physical Medicine & Rehabilitation | Admitting: Physical Medicine & Rehabilitation

## 2019-06-07 DIAGNOSIS — I2510 Atherosclerotic heart disease of native coronary artery without angina pectoris: Secondary | ICD-10-CM | POA: Diagnosis present

## 2019-06-07 DIAGNOSIS — I69391 Dysphagia following cerebral infarction: Secondary | ICD-10-CM

## 2019-06-07 DIAGNOSIS — R1310 Dysphagia, unspecified: Secondary | ICD-10-CM | POA: Diagnosis present

## 2019-06-07 DIAGNOSIS — I69321 Dysphasia following cerebral infarction: Secondary | ICD-10-CM | POA: Diagnosis not present

## 2019-06-07 DIAGNOSIS — E871 Hypo-osmolality and hyponatremia: Secondary | ICD-10-CM | POA: Diagnosis not present

## 2019-06-07 DIAGNOSIS — D62 Acute posthemorrhagic anemia: Secondary | ICD-10-CM

## 2019-06-07 DIAGNOSIS — E663 Overweight: Secondary | ICD-10-CM

## 2019-06-07 DIAGNOSIS — I10 Essential (primary) hypertension: Secondary | ICD-10-CM | POA: Diagnosis present

## 2019-06-07 DIAGNOSIS — I6602 Occlusion and stenosis of left middle cerebral artery: Secondary | ICD-10-CM

## 2019-06-07 DIAGNOSIS — E1142 Type 2 diabetes mellitus with diabetic polyneuropathy: Secondary | ICD-10-CM

## 2019-06-07 DIAGNOSIS — E876 Hypokalemia: Secondary | ICD-10-CM

## 2019-06-07 DIAGNOSIS — Z794 Long term (current) use of insulin: Secondary | ICD-10-CM

## 2019-06-07 DIAGNOSIS — Z7901 Long term (current) use of anticoagulants: Secondary | ICD-10-CM | POA: Diagnosis not present

## 2019-06-07 DIAGNOSIS — R0989 Other specified symptoms and signs involving the circulatory and respiratory systems: Secondary | ICD-10-CM | POA: Diagnosis not present

## 2019-06-07 DIAGNOSIS — E1169 Type 2 diabetes mellitus with other specified complication: Secondary | ICD-10-CM | POA: Diagnosis not present

## 2019-06-07 DIAGNOSIS — R531 Weakness: Secondary | ICD-10-CM | POA: Diagnosis present

## 2019-06-07 DIAGNOSIS — R0902 Hypoxemia: Secondary | ICD-10-CM | POA: Diagnosis present

## 2019-06-07 DIAGNOSIS — I69320 Aphasia following cerebral infarction: Secondary | ICD-10-CM | POA: Diagnosis not present

## 2019-06-07 DIAGNOSIS — I69351 Hemiplegia and hemiparesis following cerebral infarction affecting right dominant side: Principal | ICD-10-CM

## 2019-06-07 DIAGNOSIS — Z7902 Long term (current) use of antithrombotics/antiplatelets: Secondary | ICD-10-CM | POA: Diagnosis not present

## 2019-06-07 DIAGNOSIS — Z87891 Personal history of nicotine dependence: Secondary | ICD-10-CM

## 2019-06-07 DIAGNOSIS — I63511 Cerebral infarction due to unspecified occlusion or stenosis of right middle cerebral artery: Secondary | ICD-10-CM | POA: Diagnosis not present

## 2019-06-07 DIAGNOSIS — E669 Obesity, unspecified: Secondary | ICD-10-CM | POA: Diagnosis present

## 2019-06-07 DIAGNOSIS — Z4659 Encounter for fitting and adjustment of other gastrointestinal appliance and device: Secondary | ICD-10-CM

## 2019-06-07 DIAGNOSIS — I639 Cerebral infarction, unspecified: Secondary | ICD-10-CM | POA: Diagnosis present

## 2019-06-07 DIAGNOSIS — M25561 Pain in right knee: Secondary | ICD-10-CM

## 2019-06-07 DIAGNOSIS — R7309 Other abnormal glucose: Secondary | ICD-10-CM

## 2019-06-07 DIAGNOSIS — G8191 Hemiplegia, unspecified affecting right dominant side: Secondary | ICD-10-CM | POA: Diagnosis not present

## 2019-06-07 DIAGNOSIS — R4701 Aphasia: Secondary | ICD-10-CM | POA: Diagnosis not present

## 2019-06-07 HISTORY — DX: Gastro-esophageal reflux disease without esophagitis: K21.9

## 2019-06-07 HISTORY — DX: Heart failure, unspecified: I50.9

## 2019-06-07 LAB — GLUCOSE, CAPILLARY
Glucose-Capillary: 233 mg/dL — ABNORMAL HIGH (ref 70–99)
Glucose-Capillary: 209 mg/dL — ABNORMAL HIGH (ref 70–99)
Glucose-Capillary: 264 mg/dL — ABNORMAL HIGH (ref 70–99)
Glucose-Capillary: 275 mg/dL — ABNORMAL HIGH (ref 70–99)
Glucose-Capillary: 223 mg/dL — ABNORMAL HIGH (ref 70–99)
Glucose-Capillary: 233 mg/dL — ABNORMAL HIGH (ref 70–99)

## 2019-06-07 MED ORDER — ENSURE ENLIVE PO LIQD: 237.0000 mL | Freq: Three times a day (TID) | ORAL | 12 refills | Status: DC

## 2019-06-07 MED ORDER — INSULIN GLARGINE 100 UNIT/ML ~~LOC~~ SOLN
10.0000 [IU] | Freq: Every day | SUBCUTANEOUS | Status: DC
  Administered 2019-06-07: 10 [IU] via SUBCUTANEOUS
  Filled 2019-06-07 (×2): qty 0.1

## 2019-06-07 MED ORDER — ENSURE ENLIVE PO LIQD
237.0000 mL | Freq: Three times a day (TID) | ORAL | Status: DC
  Administered 2019-06-07 – 2019-06-09 (×5): 237 mL via ORAL

## 2019-06-07 MED ORDER — METFORMIN HCL 500 MG PO TABS
500.0000 mg | ORAL_TABLET | Freq: Two times a day (BID) | ORAL | Status: DC
  Administered 2019-06-07 – 2019-06-11 (×7): 500 mg via ORAL
  Filled 2019-06-07 (×6): qty 1

## 2019-06-07 MED ORDER — BISACODYL 10 MG RE SUPP: 10.0000 mg | Freq: Every day | RECTAL | Status: DC | PRN

## 2019-06-07 MED ORDER — POLYETHYLENE GLYCOL 3350 17 G PO PACK: 17.0000 g | PACK | Freq: Every day | ORAL | Status: DC | PRN

## 2019-06-07 MED ORDER — GUAIFENESIN-DM 100-10 MG/5ML PO SYRP
5.0000 mL | ORAL_SOLUTION | Freq: Four times a day (QID) | ORAL | Status: DC | PRN
  Administered 2019-06-21 – 2019-06-25 (×5): 10 mL via ORAL
  Filled 2019-06-07 (×6): qty 10

## 2019-06-07 MED ORDER — ACETAMINOPHEN 325 MG PO TABS: 650.0000 mg | ORAL_TABLET | ORAL | Status: DC | PRN

## 2019-06-07 MED ORDER — ALUM & MAG HYDROXIDE-SIMETH 200-200-20 MG/5ML PO SUSP: 30.0000 mL | ORAL | Status: DC | PRN

## 2019-06-07 MED ORDER — PROCHLORPERAZINE EDISYLATE 10 MG/2ML IJ SOLN: 5.0000 mg | Freq: Four times a day (QID) | INTRAMUSCULAR | Status: DC | PRN

## 2019-06-07 MED ORDER — INSULIN ASPART 100 UNIT/ML ~~LOC~~ SOLN
0.0000 [IU] | Freq: Three times a day (TID) | SUBCUTANEOUS | Status: DC
  Administered 2019-06-07 – 2019-06-08 (×2): 3 [IU] via SUBCUTANEOUS
  Administered 2019-06-08: 5 [IU] via SUBCUTANEOUS
  Administered 2019-06-08 – 2019-06-10 (×6): 3 [IU] via SUBCUTANEOUS
  Administered 2019-06-10: 2 [IU] via SUBCUTANEOUS
  Administered 2019-06-11 (×2): 3 [IU] via SUBCUTANEOUS
  Administered 2019-06-11 – 2019-06-12 (×4): 2 [IU] via SUBCUTANEOUS
  Administered 2019-06-13 (×2): 1 [IU] via SUBCUTANEOUS
  Administered 2019-06-13: 2 [IU] via SUBCUTANEOUS
  Administered 2019-06-14: 1 [IU] via SUBCUTANEOUS
  Administered 2019-06-14 (×2): 2 [IU] via SUBCUTANEOUS
  Administered 2019-06-15: 1 [IU] via SUBCUTANEOUS
  Administered 2019-06-15: 2 [IU] via SUBCUTANEOUS
  Administered 2019-06-15 – 2019-06-16 (×2): 1 [IU] via SUBCUTANEOUS
  Administered 2019-06-16: 2 [IU] via SUBCUTANEOUS
  Administered 2019-06-17 (×2): 1 [IU] via SUBCUTANEOUS
  Administered 2019-06-17: 2 [IU] via SUBCUTANEOUS
  Administered 2019-06-18 – 2019-06-20 (×7): 1 [IU] via SUBCUTANEOUS
  Administered 2019-06-21: 2 [IU] via SUBCUTANEOUS
  Administered 2019-06-21 – 2019-06-24 (×5): 1 [IU] via SUBCUTANEOUS
  Administered 2019-06-26: 2 [IU] via SUBCUTANEOUS
  Administered 2019-06-26: 18:00:00 1 [IU] via SUBCUTANEOUS
  Administered 2019-06-27: 2 [IU] via SUBCUTANEOUS
  Administered 2019-06-27: 1 [IU] via SUBCUTANEOUS
  Administered 2019-06-28: 3 [IU] via SUBCUTANEOUS
  Administered 2019-06-29 – 2019-07-02 (×7): 1 [IU] via SUBCUTANEOUS

## 2019-06-07 MED ORDER — ACETAMINOPHEN 325 MG PO TABS
325.0000 mg | ORAL_TABLET | ORAL | Status: DC | PRN
  Administered 2019-06-14 – 2019-06-22 (×8): 650 mg via ORAL
  Administered 2019-06-23: 325 mg via ORAL
  Administered 2019-06-26: 650 mg via ORAL
  Filled 2019-06-07 (×14): qty 2

## 2019-06-07 MED ORDER — ATORVASTATIN CALCIUM 40 MG PO TABS
40.0000 mg | ORAL_TABLET | Freq: Every day | ORAL | Status: DC
  Administered 2019-06-07 – 2019-07-01 (×26): 40 mg via ORAL
  Filled 2019-06-07 (×25): qty 1

## 2019-06-07 MED ORDER — APIXABAN 5 MG PO TABS
5.0000 mg | ORAL_TABLET | Freq: Two times a day (BID) | ORAL | Status: DC
  Administered 2019-06-07 – 2019-07-02 (×50): 5 mg via ORAL
  Filled 2019-06-07 (×51): qty 1

## 2019-06-07 MED ORDER — PROCHLORPERAZINE MALEATE 5 MG PO TABS
5.0000 mg | ORAL_TABLET | Freq: Four times a day (QID) | ORAL | Status: DC | PRN
  Administered 2019-06-13: 18:00:00 5 mg via ORAL
  Administered 2019-06-22 – 2019-06-24 (×2): 10 mg via ORAL
  Filled 2019-06-07: qty 1
  Filled 2019-06-07 (×2): qty 2

## 2019-06-07 MED ORDER — INSULIN GLARGINE 100 UNIT/ML ~~LOC~~ SOLN
10.0000 [IU] | Freq: Every day | SUBCUTANEOUS | Status: DC
  Filled 2019-06-07: qty 0.1

## 2019-06-07 MED ORDER — INSULIN GLARGINE 100 UNIT/ML ~~LOC~~ SOLN: 10.0000 [IU] | Freq: Every day | SUBCUTANEOUS | 11 refills | Status: DC

## 2019-06-07 MED ORDER — CLOPIDOGREL BISULFATE 75 MG PO TABS
75.0000 mg | ORAL_TABLET | Freq: Every day | ORAL | Status: DC
  Administered 2019-06-08 – 2019-07-02 (×25): 75 mg via ORAL
  Filled 2019-06-07 (×25): qty 1

## 2019-06-07 MED ORDER — DIPHENHYDRAMINE HCL 12.5 MG/5ML PO ELIX: 12.5000 mg | ORAL_SOLUTION | Freq: Four times a day (QID) | ORAL | Status: DC | PRN

## 2019-06-07 MED ORDER — PANTOPRAZOLE SODIUM 40 MG PO TBEC
40.0000 mg | DELAYED_RELEASE_TABLET | Freq: Every day | ORAL | Status: DC
  Administered 2019-06-08 – 2019-07-02 (×25): 40 mg via ORAL
  Filled 2019-06-07 (×26): qty 1

## 2019-06-07 MED ORDER — TRAZODONE HCL 50 MG PO TABS
25.0000 mg | ORAL_TABLET | Freq: Every evening | ORAL | Status: DC | PRN
  Administered 2019-06-11 – 2019-07-01 (×18): 25 mg via ORAL
  Filled 2019-06-07 (×19): qty 1

## 2019-06-07 MED ORDER — FLEET ENEMA 7-19 GM/118ML RE ENEM: 1.0000 | ENEMA | Freq: Once | RECTAL | Status: DC | PRN

## 2019-06-07 MED ORDER — APIXABAN 5 MG PO TABS: 5.0000 mg | ORAL_TABLET | Freq: Two times a day (BID) | ORAL | Status: AC

## 2019-06-07 MED ORDER — PROCHLORPERAZINE 25 MG RE SUPP
12.5000 mg | Freq: Four times a day (QID) | RECTAL | Status: DC | PRN
  Filled 2019-06-07: qty 1

## 2019-06-07 MED ORDER — INSULIN ASPART 100 UNIT/ML ~~LOC~~ SOLN: 0.0000 [IU] | Freq: Three times a day (TID) | SUBCUTANEOUS | 11 refills | Status: DC

## 2019-06-07 NOTE — H&P (Signed)
Physical Medicine and Rehabilitation Admission H&P        Chief Complaint  Patient presents with  .        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  2D echo showed EF 55-60 % without wall abnormalities. TCD negative for PFO.    Wife reported hx of arrhthymias in the past and telemetry revealed frequent PVCs with pauses therefore Dr. Irish Lack consulted for input. He recommended holding BB and increasing home dose Eliquis to 5 mg bid--to monitor for bleeding side effects with plavix on board. He is to follow up with primary cards at [facility] after discharge.      Review of Systems  Unable to perform ROS: Language            Past Medical History:  Diagnosis Date  . CAD (coronary artery disease)    . Chronic anticoagulation    . DM2 (diabetes mellitus, type 2) ([place])    . DVT (deep vein thrombosis) in pregnancy    . HTN (hypertension)    . TIA (transient ischemic attack)       History reviewed. No pertinent surgical history.      Family History  Problem Relation Age of Onset  . Hypertension Father       Social History:  reports that he has quit smoking. He has never used smokeless tobacco. No history on file for alcohol and drug.      Allergies: No Known Allergies            Medications Prior to Admission  Medication Sig Dispense Refill  . apixaban  (ELIQUIS) 5 MG TABS tablet Take 2.5 mg by mouth 2 (two) times daily.      Marland Kitchen atorvastatin (LIPITOR) 80 MG tablet Take 40 mg by mouth at bedtime.       . Cholecalciferol (VITAMIN D) 50 MCG (2000 UT) CAPS Take 2,000 Units by mouth 2 (two) times a day.      . clopidogrel (PLAVIX) 75 MG tablet Take 75 mg by mouth daily.      . furosemide (LASIX) 40 MG tablet Take 40 mg by mouth daily as needed for fluid or edema.       . gabapentin (NEURONTIN) 300 MG capsule Take 600 mg by mouth 2 (two) times daily.       . insulin glargine (LANTUS) 100 UNIT/ML injection Inject 30 Units into the skin at bedtime.       Marland Kitchen losartan (COZAAR) 25 MG tablet Take 12.5 mg by mouth every Monday, Wednesday, and Friday.       . metFORMIN (GLUCOPHAGE) 1000 MG tablet Take 1,000 mg by mouth daily with supper.       . metoprolol tartrate (LOPRESSOR) 25 MG tablet Take 12.5 mg by mouth 2 (two) times daily.      . Multiple Vitamin (MULTIVITAMIN WITH MINERALS) TABS tablet Take 1 tablet by mouth daily.      Marland Kitchen  nitroGLYCERIN (NITROSTAT) 0.6 MG SL tablet Place 0.6 mg under the tongue as needed for chest pain.      Marland Kitchen nystatin (NYSTATIN) powder Apply 1 g topically daily. Apply to skin fold on abdomen.      . pantoprazole (PROTONIX) 40 MG tablet Take 40 mg by mouth daily.      . potassium gluconate (HM POTASSIUM) 595 (99 K) MG TABS tablet Take 595 mg by mouth daily.      . vitamin B-12 (CYANOCOBALAMIN) 500 MCG tablet Take 500 mcg by mouth every Monday, Wednesday, and Friday.         Drug Regimen Review  Drug regimen was reviewed and remains appropriate with no significant issues identified   Home: Home Living Family/patient expects to be discharged to:: Private residence Living Arrangements: Spouse/significant other Available Help at Discharge: Family, Available PRN/intermittently Type of Home: House Additional Comments: unsure as patient with unintelligble speech  Lives With: Spouse   Functional History: Prior Function Level of  Independence: Independent with assistive device(s) Comments: per chart review pt used "walking stick" but did not move around much; has residual memory impairment with problems processing information at a normal pace.   Functional Status:  Mobility: Bed Mobility Overal bed mobility: Needs Assistance Bed Mobility: Rolling, Sidelying to Sit Rolling: Min guard Sidelying to sit: Mod assist, HOB elevated Supine to sit: +2 for physical assistance, Mod assist Sit to supine: Mod assist, Max assist, +2 for physical assistance General bed mobility comments: assist to bring R LE from EOB, scoot hips to EOB, and to elevate trunk into sitting; cues and assistance to use R UE to push self up into sitting; use of rail  Transfers Overall transfer level: Needs assistance Equipment used: Rolling walker (2 wheeled) Transfer via Lift Equipment: Stedy Transfers: Sit to/from Stand Sit to Stand: +2 physical assistance, Min assist, +2 safety/equipment, Mod assist Stand pivot transfers: Max assist, +2 physical assistance General transfer comment: pt stood X 2 trials from EOB and X 1 from Tyro seat; cues for safe hand placement and hand over hand assist for R UE grip on RW; assist to power up into standing; pt unable to pivot or pick up feet so Stedy standing frame utilized for transfer to recliner; pt is able to stand with min A +2 using standing frame  Ambulation/Gait General Gait Details: unable     ADL: ADL Overall ADL's : Needs assistance/impaired Grooming: Set up, Sitting Grooming Details (indicate cue type and reason): min cue to wash face and completed iwth L UE Upper Body Bathing: Maximal assistance Lower Body Bathing: +2 for physical assistance, Bed level Upper Body Dressing : Maximal assistance Lower Body Dressing: Total assistance Toilet Transfer: +2 for physical assistance, Minimal assistance Toilet Transfer Details (indicate cue type and reason): sara stedy with (A) to position R LE (  demonstrate buckle and use of knee plate upon standing) pt shifting weight toward the L side to maintain static standing Functional mobility during ADLs: +2 for physical assistance, Maximal assistance General ADL Comments: pt able to sustain static standing in sara stedy with cues for core extension and looking out window as a motivator   Cognition: Cognition Overall Cognitive Status: Difficult to assess Arousal/Alertness: Awake/alert Orientation Level: Oriented to person, Oriented to place Cognition Arousal/Alertness: Awake/alert Behavior During Therapy: Flat affect Overall Cognitive Status: Difficult to assess Orientation Level: (able to state we are at [facility]) Current Attention Level: Sustained Following Commands: Follows one step commands consistently, Follows one step commands with  increased time Safety/Judgement: Decreased awareness of safety Problem Solving: Requires verbal cues, Requires tactile cues General Comments: pt repeating "wow" for every response this session and did not attempt to verbalize as much as previous session on 6/26 however does follow single step commands Difficult to assess due to: Impaired communication     Blood pressure 132/68, pulse 70, temperature 98.8 F (37.1 C), temperature source Oral, resp. rate 18, height 6' (1.829 m), weight 122 kg, SpO2 95 %. Physical Exam  Constitutional: No distress.  Large man, sitting in chair with legs crossed   HENT:  Head: Normocephalic.  Eyes: Pupils are equal, round, and reactive to light. Left eye exhibits no discharge.  Neck: Normal range of motion. No tracheal deviation present. No thyromegaly present.  Cardiovascular: Normal rate and regular rhythm.  No murmur heard. Respiratory: Effort normal. No respiratory distress. He has no wheezes.  GI: Soft. He exhibits no distension. There is no abdominal tenderness.  Musculoskeletal:        General: No deformity or edema.  Neurological: He is alert.  Makes eye  contact with me. Right central 7. Follows simple commands with verbal and tactile cues. Word finding deficits, garbled speech. RUE 1/5 prox to distal. RLE 2-/5 prox to 1-2/5 distally. LUE and LLE 5/5. Senses pain right arm and leg.   Skin: Skin is warm. He is not diaphoretic.  Scattered abrasions right arm and leg  Psychiatric:  Pt flat, distracted      Lab Results Last 48 Hours        Results for orders placed or performed during the hospital encounter of 05/31/19 (from the past 48 hour(s))  Glucose, capillary     Status: Abnormal    Collection Time: 06/05/19  4:05 PM  Result Value Ref Range    Glucose-Capillary 240 (H) 70 - 99 mg/dL    Comment 1 Notify RN      Comment 2 Document in Chart    Glucose, capillary     Status: Abnormal    Collection Time: 06/05/19  9:22 PM  Result Value Ref Range    Glucose-Capillary 199 (H) 70 - 99 mg/dL  Glucose, capillary     Status: Abnormal    Collection Time: 06/06/19  6:06 AM  Result Value Ref Range    Glucose-Capillary 187 (H) 70 - 99 mg/dL  Basic metabolic panel     Status: Abnormal    Collection Time: 06/06/19  6:11 AM  Result Value Ref Range    Sodium 139 135 - 145 mmol/L    Potassium 3.7 3.5 - 5.1 mmol/L    Chloride 103 98 - 111 mmol/L    CO2 24 22 - 32 mmol/L    Glucose, Bld 213 (H) 70 - 99 mg/dL    BUN 17 8 - 23 mg/dL    Creatinine, Ser 0.93 0.61 - 1.24 mg/dL    Calcium 9.0 8.9 - 10.3 mg/dL    GFR calc non Af Amer >60 >60 mL/min    GFR calc Af Amer >60 >60 mL/min    Anion gap 12 5 - 15      Comment: Performed at [facility], [address], [place], [place] [address]  Glucose, capillary     Status: Abnormal    Collection Time: 06/06/19 11:11 AM  Result Value Ref Range    Glucose-Capillary 231 (H) 70 - 99 mg/dL    Comment 1 Notify RN      Comment 2 Document in Chart  Glucose, capillary     Status: Abnormal    Collection Time: 06/06/19  3:44 PM  Result Value Ref Range    Glucose-Capillary 259 (H) 70 - 99 mg/dL     Comment 1 Notify RN      Comment 2 Document in Chart    Glucose, capillary     Status: Abnormal    Collection Time: 06/06/19  9:14 PM  Result Value Ref Range    Glucose-Capillary 255 (H) 70 - 99 mg/dL  Glucose, capillary     Status: Abnormal    Collection Time: 06/07/19  6:11 AM  Result Value Ref Range    Glucose-Capillary 209 (H) 70 - 99 mg/dL  Glucose, capillary     Status: Abnormal    Collection Time: 06/07/19 11:22 AM  Result Value Ref Range    Glucose-Capillary 264 (H) 70 - 99 mg/dL    Comment 1 Notify RN      Comment 2 Document in Chart       Imaging Results (Last 48 hours)  No results found.           Medical Problem List and Plan: 1.  Functional and cognitive deficits secondary to left MCA infarct             -admit to inpatient rehab 2.  Antithrombotics: -DVT/anticoagulation:  Pharmaceutical: Other (comment)--Eliquis             -antiplatelet therapy: Plavix 3. Pain Management: tylenol prn 4. Mood: LCSW to follow for evaluation and support.              -antipsychotic agents: N/A 5. Neuropsych: This patient is not capable of making decisions on his own behalf. 6. Skin/Wound Care: Routine pressure relief measures 7. Fluids/Electrolytes/Nutrition: Monitor I/O. Check lytes in am.Hypokalemia resolved past supplementation 8. CAD: On Lipitor and Plavix --no BB due to intermittent pauses.   9. T2DM with neuropathy: Monitor BS ac/hs. Was on metformin, Glipizide and Lantus at home--currently Lantus 10 units at bedtime.  Will resume metformin bid for meal coverage.  10.Hypoxia: Has been weaned off oxygen. Continue to monitor for recurrent fevers. 11. LUL pulmonary nodule: follow up CT in 6-12 months   Post Admission Physician Evaluation: 1. Functional deficits secondary  to left MCA infarct. 2. Patient is admitted to receive collaborative, interdisciplinary care between the physiatrist, rehab nursing staff, and therapy team. 3. Patient's level of medical complexity and  substantial therapy needs in context of that medical necessity cannot be provided at a lesser intensity of care such as a SNF. 4. Patient has experienced substantial functional loss from his/her baseline which was documented above under the "Functional History" and "Functional Status" headings.  Judging by the patient's diagnosis, physical exam, and functional history, the patient has potential for functional progress which will result in measurable gains while on inpatient rehab.  These gains will be of substantial and practical use upon discharge  in facilitating mobility and self-care at the household level. 5. Physiatrist will provide 24 hour management of medical needs as well as oversight of the therapy plan/treatment and provide guidance as appropriate regarding the interaction of the two. 6. The Preadmission Screening has been reviewed and patient status is unchanged unless otherwise stated above. 7. 24 hour rehab nursing will assist with bladder management, bowel management, safety, skin/wound care, disease management, medication administration, pain management and patient education  and help integrate therapy concepts, techniques,education, etc. 8. PT will assess and treat for/with: Lower extremity strength, range of motion, stamina,  balance, functional mobility, safety, adaptive techniques and equipment, NMR, family ed.   Goals are: supervision. 9. OT will assess and treat for/with: ADL's, functional mobility, safety, upper extremity strength, adaptive techniques and equipment, NMR, family ed.   Goals are: supervision. Therapy may proceed with showering this patient. 10. SLP will assess and treat for/with: speech, swallowing, language, and communication.  Goals are: supervision to min + assist. 11. Case Management and Social Worker will assess and treat for psychological issues and discharge planning. 12. Team conference will be held weekly to assess progress toward goals and to determine  barriers to discharge. 13. Patient will receive at least 3 hours of therapy per day at least 5 days per week. 14. ELOS: 18-21 days       15. Prognosis:  excellent   I have personally performed a face to face diagnostic evaluation of this patient and formulated the key components of the plan.  Additionally, I have personally reviewed laboratory data, imaging studies, as well as relevant notes and concur with the physician assistant's documentation above.  Meredith Staggers, MD, [place], PA-C 06/07/2019

## 2019-06-07 NOTE — Plan of Care (Signed)
Progressing towards goals

## 2019-06-07 NOTE — Progress Notes (Addendum)
Inpatient Rehab Admissions Coordinator:    I have insurance authorization and a bed available for pt to admit to CIR today.  I await MD approval.    Addendum: I have approval from Dr. Grandville Silos and I will plan to admit pt today. I will let CM, RN, and family know.   Shann Medal, PT, DPT Admissions Coordinator [phone] 06/07/19  10:53 AM

## 2019-06-07 NOTE — Progress Notes (Signed)
Patient ID: Luke Rivera, male   DOB: Apr 09, 1941, 78 y.o.   MRN: [id number] Admit to floor.  Oriented to unit.  Reviewed medications.Acknowledged understanding. Luna Glasgow

## 2019-06-07 NOTE — PMR Pre-admission (Signed)
PMR Admission Coordinator Pre-Admission Assessment  Patient: Luke Rivera is an 78 y.o., male MRN: [id number] DOB: May 07, 1941 Height: 6' (182.9 cm) Weight: 122 kg  Insurance Information HMO:     PPO: yes     PCP:      IPA:      80/20:      OTHER:  PRIMARY: [employer] Medicare      Policy#: [id number]      Subscriber: patient CM Name: Larene Beach      Phone#:      Fax#:  Pre-Cert#: [id number]      Employer: n/a Benefits:  Phone #: [phone]     Name:  Eff. Date: 12/09/2018     Deduct: $0      Out of Pocket Max: $5900 (met $35)      Life Max:  CIR: $375/day for days 1-4      SNF: 20 full days Outpatient: $35/visit     Co-Pay:  Home Health: 100%      Co-Pay:  DME: 80%     Co-Pay: 20% Providers:  SECONDARY:       Policy#:       Subscriber:  CM Name:       Phone#:      Fax#:  Pre-Cert#:       Employer:  Benefits:  Phone #:      Name:  Eff. Date:      Deduct:       Out of Pocket Max:       Life Max:  CIR:       SNF:  Outpatient:      Co-Pay:  Home Health:       Co-Pay:  DME:      Co-Pay:   Medicaid Application Date:       Case Manager:  Disability Application Date:       Case Worker:   The "Data Collection Information Summary" for patients in Inpatient Rehabilitation Facilities with attached "Privacy Act [place] Records" was provided and verbally reviewed with: Family  Emergency Contact Information Contact Information    Name Relation Home Work Gray Spouse [phone]  [phone]      Current Medical History  Patient Admitting Diagnosis: L MCA CVA  History of Present Illness: Pt is a 78 y/o male with 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  Code stroke on arrival to ED.  CT negative for hemorrhage, and CTA showed no target for intervention.  MRI showed acute L posterior frontal MCA infarct and chronic frontal L MCA infarct as well.  ECHO normal, bubble negative for PFO.   Therapy evaluations completed and pt demonstrates deficits in mobility, ADLs, cognition, speech, and swallow.  Recommendations were made for CIR.   Complete NIHSS TOTAL: 14  Patient's medical record from [facility] has been reviewed by the rehabilitation admission coordinator and physician.  Past Medical History  Past Medical History:  Diagnosis Date  . CAD (coronary artery disease)   . Chronic anticoagulation   . DM2 (diabetes mellitus, type 2) ([place])   . DVT (deep vein thrombosis) in pregnancy   . HTN (hypertension)   . TIA (transient ischemic attack)     Family History   family history includes Hypertension in his father.  Prior Rehab/Hospitalizations Has the patient had prior rehab or hospitalizations prior to admission? No  Has the patient had major surgery during 100 days prior to admission?  No   Current Medications  Current Facility-Administered Medications:  .  acetaminophen (TYLENOL) tablet 650 mg, 650 mg, Oral, Q4H PRN **OR** acetaminophen (TYLENOL) solution 650 mg, 650 mg, Per Tube, Q4H PRN **OR** acetaminophen (TYLENOL) suppository 650 mg, 650 mg, Rectal, Q4H PRN, Mullis, Kiersten P, DO .  apixaban (ELIQUIS) tablet 5 mg, 5 mg, Oral, BID, Meccariello, Bailey J, DO, 5 mg at 06/07/19 1005 .  atorvastatin (LIPITOR) tablet 40 mg, 40 mg, Oral, QHS, Meccariello, Bailey J, DO, 40 mg at 06/06/19 2140 .  clopidogrel (PLAVIX) tablet 75 mg, 75 mg, Oral, Daily, Matilde Haymaker, MD, 75 mg at 06/07/19 1005 .  feeding supplement (ENSURE ENLIVE) (ENSURE ENLIVE) liquid 237 mL, 237 mL, Oral, TID BM, Dickie La, MD, 237 mL at 06/07/19 1004 .  insulin aspart (novoLOG) injection 0-9 Units, 0-9 Units, Subcutaneous, TID WC, Matilde Haymaker, MD, 3 Units at 06/07/19 0645 .  insulin glargine (LANTUS) injection 10 Units, 10 Units, Subcutaneous, QHS, Mullis, Kiersten P, DO .  pantoprazole (PROTONIX) EC tablet 40 mg, 40 mg, Oral, Daily, Meccariello, Bailey J, DO, 40 mg at 06/07/19  1005  Patients Current Diet:  Diet Order            DIET DYS 2 Room service appropriate? No; Fluid consistency: Thin  Diet effective now              Precautions / Restrictions Precautions Precautions: Fall Precaution Comments: R side inattention Restrictions Weight Bearing Restrictions: No   Has the patient had 2 or more falls or a fall with injury in the past year? No  Prior Activity Level Limited Community (1-2x/wk): limited mobility, independent with walking stick, but sedentary with stooped posture  Prior Functional Level Self Care: Did the patient need help bathing, dressing, using the toilet or eating? Independent  Indoor Mobility: Did the patient need assistance with walking from room to room (with or without device)? Independent  Stairs: Did the patient need assistance with internal or external stairs (with or without device)? Independent  Functional Cognition: Did the patient need help planning regular tasks such as shopping or remembering to take medications? Needed some help  Home Assistive Devices / Equipment    Prior Device Use: Indicate devices/aids used by the patient prior to current illness, exacerbation or injury? walking stick  Current Functional Level Cognition  Arousal/Alertness: Awake/alert Overall Cognitive Status: Difficult to assess Difficult to assess due to: Impaired communication Current Attention Level: Sustained Orientation Level: Oriented to person, Oriented to place Following Commands: Follows one step commands consistently, Follows one step commands with increased time Safety/Judgement: Decreased awareness of safety General Comments: pt repeating "wow" for every response this session and did not attempt to verbalize as much as previous session on 6/26 however does follow single step commands    Extremity Assessment (includes Sensation/Coordination)  Upper Extremity Assessment: RUE deficits/detail RUE Deficits / Details:  demonstrates activation of R UE with 2+ out 5 elbow flexion and 3+ out 5 elbow extension. pt with uncoordinated movement but able tofollow command to activate RUE Sensation: decreased proprioception, decreased light touch RUE Coordination: decreased fine motor, decreased gross motor  Lower Extremity Assessment: Defer to PT evaluation RLE Deficits / Details: does not attempt to lift against gravity RLE Sensation: decreased light touch, decreased proprioception RLE Coordination: decreased fine motor, decreased gross motor    ADLs  Overall ADL's : Needs assistance/impaired Grooming: Set up, Sitting Grooming Details (indicate cue type and reason): min cue to wash face and completed iwth L  UE Upper Body Bathing: Maximal assistance Lower Body Bathing: +2 for physical assistance, Bed level Upper Body Dressing : Maximal assistance Lower Body Dressing: Total assistance Toilet Transfer: +2 for physical assistance, Minimal assistance Toilet Transfer Details (indicate cue type and reason): sara stedy with (A) to position R LE ( demonstrate buckle and use of knee plate upon standing) pt shifting weight toward the L side to maintain static standing Functional mobility during ADLs: +2 for physical assistance, Maximal assistance General ADL Comments: pt able to sustain static standing in sara stedy with cues for core extension and looking out window as a motivator    Mobility  Overal bed mobility: Needs Assistance Bed Mobility: Rolling, Sidelying to Sit Rolling: Min guard Sidelying to sit: Mod assist, HOB elevated Supine to sit: +2 for physical assistance, Mod assist Sit to supine: Mod assist, Max assist, +2 for physical assistance General bed mobility comments: assist to bring R LE from EOB, scoot hips to EOB, and to elevate trunk into sitting; cues and assistance to use R UE to push self up into sitting; use of rail     Transfers  Overall transfer level: Needs assistance Equipment used: Rolling  walker (2 wheeled) Transfer via Lift Equipment: Stedy Transfers: Sit to/from Stand Sit to Stand: +2 physical assistance, Min assist, +2 safety/equipment, Mod assist Stand pivot transfers: Max assist, +2 physical assistance General transfer comment: pt stood X 2 trials from EOB and X 1 from [place] seat; cues for safe hand placement and hand over hand assist for R UE grip on RW; assist to power up into standing; pt unable to pivot or pick up feet so Stedy standing frame utilized for transfer to recliner; pt is able to stand with min A +2 using standing frame     Ambulation / Gait / Stairs / Wheelchair Mobility  Ambulation/Gait General Gait Details: unable    Posture / Balance Balance Overall balance assessment: Needs assistance Sitting-balance support: Feet supported, Bilateral upper extremity supported Sitting balance-Leahy Scale: Fair Standing balance support: Bilateral upper extremity supported, During functional activity Standing balance-Leahy Scale: Poor Standing balance comment: multimodal cues for hip extension and forward gaze    Special needs/care consideration BiPAP/CPAP no CPM no Continuous Drip IV no Dialysis no        Days n/a Life Vest no Oxygen no Special Bed no Trach Size no Wound Vac (area) no      Location n/a Skin ecchymosis to BLEs                            Bowel mgmt: incontinent, last BM 6/27 Bladder mgmt: incontinent Diabetic mgmt: yes Behavioral consideration no Chemo/radiation no   Previous Home Environment (from acute therapy documentation) Living Arrangements: Spouse/significant other  Lives With: Spouse Available Help at Discharge: Family, Available PRN/intermittently Type of Home: House Additional Comments: unsure as patient with unintelligble speech  Discharge Living Setting Plans for Discharge Living Setting: Patient's home Type of Home at Discharge: House Discharge Home Layout: One level Discharge Home Access: Stairs to enter Entrance  Stairs-Rails: None Entrance Stairs-Number of Steps: 1+1 Discharge Bathroom Shower/Tub: Walk-in shower Discharge Bathroom Toilet: Handicapped height Discharge Bathroom Accessibility: Yes How Accessible: Accessible via wheelchair Does the patient have any problems obtaining your medications?: No  Social/Family/Support Systems Patient Roles: Spouse Anticipated Caregiver: spouse, Obinna Ehresman Anticipated Caregiver's Contact Information: [name] [phone] Ability/Limitations of Caregiver: supervision Caregiver Availability: 24/7 Discharge Plan Discussed with Primary Caregiver: Yes Is Caregiver In  Agreement with Plan?: Yes Does Caregiver/Family have Issues with Lodging/Transportation while Pt is in Rehab?: No  Goals/Additional Needs Patient/Family Goal for Rehab: PT/OT/SLP supervision Expected length of stay: 18-21 days Dietary Needs: D2/thin Equipment Needs: tbd Pt/Family Agrees to Admission and willing to participate: Yes Program Orientation Provided & Reviewed with Pt/Caregiver Including Roles  & Responsibilities: Yes  Decrease burden of Care through IP rehab admission: n/a  Possible need for SNF placement upon discharge: not anticipated  Patient Condition: I have reviewed medical records from [facility], spoken with CM, and patient, spouse and daughter. I met with patient at the bedside and discussed with family via phone for inpatient rehabilitation assessment.  Patient will benefit from ongoing PT, OT and SLP, can actively participate in 3 hours of therapy a day 5 days of the week, and can make measurable gains during the admission.  Patient will also benefit from the coordinated team approach during an Inpatient Acute Rehabilitation admission.  The patient will receive intensive therapy as well as Rehabilitation physician, nursing, social worker, and care management interventions.  Due to bladder management, bowel management, safety, skin/wound care, disease management,  medication administration, pain management and patient education the patient requires 24 hour a day rehabilitation nursing.  The patient is currently mod +2 with mobility and basic ADLs.  Discharge setting and therapy post discharge at home with home health is anticipated.  Patient has agreed to participate in the Acute Inpatient Rehabilitation Program and will admit today.  Preadmission Screen Completed By:  Michel Santee, PT, DPT 06/07/2019 11:09 AM ______________________________________________________________________   Discussed status with Dr. Naaman Plummer on 06/07/19  at 11:09 AM  and received approval for admission today.  Admission Coordinator:  Michel Santee, PT, DPT 06/07/19 /11:09 AM    Assessment/Plan: Diagnosis: left MCA infarct 1. Does the need for close, 24 hr/day Medical supervision in concert with the patient's rehab needs make it unreasonable for this patient to be served in a less intensive setting? Yes 2. Co-Morbidities requiring supervision/potential complications: DM2, DVT, CAD 3. Due to bladder management, bowel management, safety, skin/wound care, disease management, medication administration, pain management and patient education, does the patient require 24 hr/day rehab nursing? Yes 4. Does the patient require coordinated care of a physician, rehab nurse, PT (1-2 hrs/day, 5 days/week), OT (1-2 hrs/day, 5 days/week) and SLP (1-2 hrs/day, 5 days/week) to address physical and functional deficits in the context of the above medical diagnosis(es)? Yes Addressing deficits in the following areas: balance, endurance, locomotion, strength, transferring, bowel/bladder control, bathing, dressing, feeding, grooming, toileting, cognition, speech, language, swallowing and psychosocial support 5. Can the patient actively participate in an intensive therapy program of at least 3 hrs of therapy 5 days a week? Yes 6. The potential for patient to make measurable gains while on inpatient rehab  is excellent 7. Anticipated functional outcomes upon discharge from inpatients are: supervision PT, supervision OT, supervision SLP 8. Estimated rehab length of stay to reach the above functional goals is: 18-21 days 9. Anticipated D/C setting: Home 10. Anticipated post D/C treatments: [place] therapy 11. Overall Rehab/Functional Prognosis: excellent  MD Signature: Meredith Staggers, MD, [facility] 06/07/2019

## 2019-06-07 NOTE — Progress Notes (Addendum)
Family Medicine Teaching Service Daily Progress Note Intern Pager: [phone]  Patient name: Luke Rivera Medical record number: [id number] Date of birth: [birth year] Age: 78 y.o. Gender: male  Primary Care Provider: Kristopher Glee., MD Consultants: Neuro Code Status: DNR  Pt Overview and Major Events to Date:  6/22 Admitted to [facility] 6/24 Referral placed for CIR placement  Assessment and Plan: Valente Fosberg a 78 y.o.malepresenting with gargled speech and right sided deficits, found to have new left MCA embolic infarct. 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   Left MCA embolic infarct Patient remains medically stable for discharge to CIR.  Approval pending.  This a.m. patient able to ask "why am I not at rehab."  Able to move right hand and right leg, 3/5 strength. -Neurology and cardiology have signed off -Continue Eliquis 5 mg twice daily, Plavix daily -Patient should NOT be on aspirin -PT/OT: CIR -permissive HTN (<220/120), normalize gradually in 5 to 7 days -poor po intake, nutrition following  Possible atrial fibrillation, PVCs Telemetry with occasional PVCs.  Has remote history of A fib per wife. -Continue Eliquis 5 mg twice daily -f/u with outpatient [facility]ologist at [facility] -will ensure electrolytes repleted to optimal levels -consider loop recorder for PAF  Hypokalemia: Resolved K3.7 on 6/28.  No labs this AM. -Given that patient has remained medically stable for discharge to CIR, will not repeat labs today  HTN: BP 149/58 this AM.  Home meds:Lasix40mg  PRN, Lopressor 12.5mg  BID, losartan 12.5mg  3x/week, Potassium gluconate 99mg  QD - permissive HTN (<220/120), normalize gradually in 5 to 7 days - hold home meds, especially lopressor in the setting of pauses on telemetry  T2DM with periopheral neuropathy: A1c on admission 7.3.Home meds: Glipizide 10mg   BID, Lantus 40U qHS, Metformin 1000mg  qHS, Gabapentin 600mg  BID.    CBG this a.m. 187-259. - sSSI - CBGs AC/HS - hold all home meds - hold glipizide on d/c - start lantus 10u QHS  HLD: Home meds: Atorvastatin 40mg  QD.  LDL 49. 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 fib dosing  GERD:Protonix 40mg  QD  Incidental Pulmonary Nodule: Head CTA notable for an indeterminant 45mm left upper lobe nodule with recommendation for a repeat non-contrast chest CT at 6-12 months.  FEN/GI: Dysphagia 2 diet per SLP recommendations PPx: Eliquis  Disposition:  Patient remains medically discharge to CIR when bed available in insurance approval  Subjective:  Patient able to ask "why my not at rehab?"  Awake, not in distress.  Objective: Temp:  [97.8 F (36.6 C)-98.8 F (37.1 C)] 98.8 F (37.1 C) (06/29 0319) Pulse Rate:  [72-111] 83 (06/29 0319) Resp:  [15-20] 20 (06/29 0319) BP: (127-149)/(58-93) 149/58 (06/28 2341) SpO2:  [94 %-96 %] 96 % (06/29 0319)  Physical Exam:  General: 78 y.o. male in NAD Cardio: RRR no m/r/g Lungs: CTAB, no wheezing, no rhonchi, no crackles, no IWOB on RA Abdomen: Soft, non-tender to palpation, non-distended, positive bowel sounds Skin: warm and dry Extremities: No edema, 3/5 strength RUE/RLE Neuro: Dysarthric, able to state "why am I not at rehab?",  Follows simple commands  Laboratory: Recent Labs  Lab 06/03/19 0402 06/04/19 0420 06/05/19 0344  WBC 6.4 6.1 6.4  HGB 12.2* 12.1* 12.9*  HCT 36.8* 36.0* 38.0*  PLT 252 238 265   Recent Labs  Lab 05/31/19 2009  06/04/19 0420 06/05/19 0344 06/06/19 0611  NA 140   < > 140 140 139  K 3.7   < > 3.5 3.4* 3.7  CL 106   < > 105 103 103  CO2  22   < > 25 25 24   BUN 11   < > 16 16 17   CREATININE 1.08   < > 0.79 0.84 0.93  CALCIUM 8.9   < > 9.1 9.1 9.0  PROT 6.4*  --   --   --   --   BILITOT 1.0  --   --   --   --   ALKPHOS 74  --   --   --   --   ALT 23  --   --   --   --   AST 25  --   --   --   --   GLUCOSE 202*   < > 165* 182* 213*   < > = values in this interval not displayed.    Imaging/Diagnostic Tests: No new imagining  Cleophas Dunker, DO 06/07/2019, 7:28 AM PGY-1, [place] Intern pager: [phone], text pages welcome

## 2019-06-07 NOTE — Progress Notes (Signed)
Patient being transferred to CIR. No IV to remove. Telemetry removed. Report given. Glasses sent with the patient.

## 2019-06-07 NOTE — Progress Notes (Signed)
Occupational Therapy Treatment Patient Details Name: Luke Rivera MRN: [id number] DOB: Apr 15, 1941 Today's Date: 06/07/2019    History of present illness 78 y.o. male presenting with gargled speech and right sided deficits. 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 No intracranial hemorrhage. MRI pending   OT comments  Pt provided his glasses during session while he was supine and pt smiling states "wow" in response to don. Pt using L ue to place R ue on stedy this session without cues. Pt eager to participate and get OOB. Pt max (A) transfer to stedy this session.    Follow Up Recommendations  CIR    Equipment Recommendations  Other (comment)(next venue)    Recommendations for Other Services Rehab consult    Precautions / Restrictions Precautions Precautions: Fall Precaution Comments: R side inattention       Mobility Bed Mobility Overal bed mobility: Needs Assistance       Supine to sit: Mod assist     General bed mobility comments: pt using L ue to pull trunk to upright sitting with HOB elevated. pt with (A) to progress R hip to EOB  Transfers Overall transfer level: Needs assistance   Transfers: Sit to/from Stand Sit to Stand: Max assist         General transfer comment: elevated bed surface to allow stedy to fit under the bed surface    Balance           Standing balance support: Bilateral upper extremity supported;During functional activity Standing balance-Leahy Scale: Fair Standing balance comment: requires blocking R LE and positioning by Therapist                            ADL either performed or assessed with clinical judgement   ADL Overall ADL's : Needs assistance/impaired      Grooming: Moderate assistance;Sitting Grooming Details (indicate cue type and reason): pt in stedy static sitting at sink level for grooming. pt brushing teeth with mod (A). pt noted ot have some pocketed food. pt leaning to the R and R hand sliding off due to attention visually to mirror. Pt provided mod (A) for upright posture                 Toilet Transfer: Maximal assistance(stedy elevated bed surface to simulate sit<S.tand)             General ADL Comments: pt requires stedy to transfer to sink then to chair.      Vision       Perception     Praxis      Cognition Arousal/Alertness: Awake/alert Behavior During Therapy: (smiling and pointing at thearpist) Overall Cognitive Status: Difficult to assess                                 General Comments: pt thanking thearpist very clearly at the end of session.         Exercises     Shoulder Instructions       General Comments reness noted at sacrum and pad applied this session. pt noted to have incontinence on arrival. Pt could benefit from a toileting schedule on CIR  Pertinent Vitals/ Pain       Pain Assessment: Faces Pain Location: grimacing R knee positioning Pain Descriptors / Indicators: Grimacing Pain Intervention(s): Monitored during session;Repositioned  Home Living                                          Prior Functioning/Environment              Frequency  Min 3X/week        Progress Toward Goals  OT Goals(current goals can now be found in the care plan section)  Progress towards OT goals: Progressing toward goals  Acute Rehab OT Goals Patient Stated Goal: smiling and pointing to therapist. pt recognizes therpaist this session. pt provided glasses in session and states "wow" in response to being able to see OT Goal Formulation: With patient Time For Goal Achievement: 06/15/19 Potential to Achieve Goals: Fair ADL Goals Pt Will Perform  Grooming: with min assist Pt Will Perform Upper Body Bathing: with min assist Pt Will Perform Lower Body Bathing: with mod assist Pt Will Perform Upper Body Dressing: with min assist Pt Will Perform Lower Body Dressing: with mod assist Pt Will Transfer to Toilet: with mod assist Pt Will Perform Toileting - Clothing Manipulation and hygiene: with mod assist  Plan Discharge plan remains appropriate    Co-evaluation                 AM-PAC OT "6 Clicks" Daily Activity     Outcome Measure   Help from another person eating meals?: A Little Help from another person taking care of personal grooming?: A Lot Help from another person toileting, which includes using toliet, bedpan, or urinal?: A Lot Help from another person bathing (including washing, rinsing, drying)?: A Lot Help from another person to put on and taking off regular upper body clothing?: A Lot Help from another person to put on and taking off regular lower body clothing?: Total 6 Click Score: 12    End of Session Equipment Utilized During Treatment: Gait belt  OT Visit Diagnosis: Unsteadiness on feet (R26.81);Muscle weakness (generalized) (M62.81);Apraxia (R48.2);Hemiplegia and hemiparesis;Pain Hemiplegia - Right/Left: Right Hemiplegia - dominant/non-dominant: Dominant   Activity Tolerance Patient tolerated treatment well   Patient Left in chair;with call bell/phone within reach;with chair alarm set   Nurse Communication Mobility status;Precautions        Time: 5027-7412 OT Time Calculation (min): 34 min  Charges: OT General Charges $OT Visit: 1 Visit OT Treatments $Self Care/Home Management : 23-37 mins   Jeri Modena, OTR/L  Acute Rehabilitation Services Pager: [phone] Office: [phone] .    Jeri Modena 06/07/2019, 1:52 PM

## 2019-06-07 NOTE — Progress Notes (Signed)
  Speech Language Pathology Treatment: Dysphagia  Patient Details Name: Luke Rivera MRN: [id number] DOB: [birth year] Today's Date: 06/07/2019 Time: 2500-3704 SLP Time Calculation (min) (ACUTE ONLY): 12 min  Assessment / Plan / Recommendation Clinical Impression  Precautions over bed indicate pt remains constrained to sips of thin from teaspoon.  Pt observed to drink thin liquids from a straw with no s/s of aspiration.  He consumed trials today of graham cracker and peanut butter, with ongoing anterior residue right side, requiring cues for attention and removal. Pt has been eating minimal amounts from Dys 2 tray.  Recommend advancing diet to dysphagia 3, thin liquids; allow straws - hopefully advancing diet will allow greater food choices and improve intake.  Pt will continue to require full supervision to ensure safety while eating, and to encourage lingual sweep to clear pocketing on right.  He is D/Cing today to CIR and will benefit from ongoing dysphagia and aphasia therapy.   HPI HPI: Pt is an 78 y.o. male who presented with "gargled speech" and right sided deficits. 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 MRI of the brain revealed acute left MCA infarcts with greatest involvement of the posterior frontal lobe.      SLP Plan  Discharge SLP treatment due to (comment) d/cing to cir       Recommendations  Diet recommendations: Dysphagia 3 (mechanical soft);Thin liquid Liquids provided via: Cup;Straw Medication Administration: Crushed with puree Supervision: Full supervision/cueing for compensatory strategies Compensations: Slow rate;Small sips/bites;Lingual sweep for clearance of pocketing;Follow solids with liquid Postural Changes and/or  Swallow Maneuvers: Seated upright 90 degrees                Oral Care Recommendations: Oral care BID Follow up Recommendations: Inpatient Rehab SLP Visit Diagnosis: Dysphagia, oropharyngeal phase (R13.12) Plan: Discharge SLP treatment due to (comment)       GO                Juan Quam Laurice 06/07/2019, 11:25 AM  Estill Bamberg L. Tivis Ringer, [place] Office number [phone] Pager [phone]

## 2019-06-07 NOTE — Progress Notes (Signed)
Meredith Staggers, MD  Physician  Physical Medicine and Rehabilitation  PMR Pre-admission  Signed  Date of Service:  06/07/2019 10:23 AM      Related encounter: ED to Hosp-Admission (Discharged) from 05/31/2019 in [facility]      Signed         Show:Clear all [x] Manual[x] Template[] Copied  Added by: [x] Meredith Staggers, MD[x] Michel Santee, PT  [] Hover for details PMR Admission Coordinator Pre-Admission Assessment  Patient: Luke Rivera is an 78 y.o., male MRN: [id number] DOB: 05/21/1941 Height: 6' (182.9 cm) Weight: 122 kg  Insurance Information HMO:     PPO: yes     PCP:      IPA:      80/20:      OTHER:  PRIMARY: [employer] Medicare      Policy#: [id number]      Subscriber: patient CM Name: Larene Beach      Phone#:      Fax#:  Pre-Cert#: [id number]      Employer: n/a Benefits:  Phone #: [phone]     Name:  Eff. Date: 12/09/2018     Deduct: $0      Out of Pocket Max: $5900 (met $35)      Life Max:  CIR: $375/day for days 1-4      SNF: 20 full days Outpatient: $35/visit     Co-Pay:  Home Health: 100%      Co-Pay:  DME: 80%     Co-Pay: 20% Providers:  SECONDARY:       Policy#:       Subscriber:  CM Name:       Phone#:      Fax#:  Pre-Cert#:       Employer:  Benefits:  Phone #:      Name:  Eff. Date:      Deduct:       Out of Pocket Max:       Life Max:  CIR:       SNF:  Outpatient:      Co-Pay:  Home Health:       Co-Pay:  DME:      Co-Pay:   Medicaid Application Date:       Case Manager:  Disability Application Date:       Case Worker:   The "Data Collection Information Summary" for patients in Inpatient Rehabilitation Facilities with attached "Privacy Act [place] Records" was provided and verbally reviewed with: Family  Emergency Contact Information         Contact Information    Name Relation Home Work [place] Spouse [phone]  [phone]      Current Medical History  Patient  Admitting Diagnosis: L MCA CVA  History of Present Illness: Pt is a 78 y/o male with 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  Code stroke on arrival to ED.  CT negative for hemorrhage, and CTA showed no target for intervention.  MRI showed acute L posterior frontal MCA infarct and chronic frontal L MCA infarct as well.  ECHO normal, bubble negative for PFO.  Therapy evaluations completed and pt demonstrates deficits in mobility, ADLs, cognition, speech, and swallow.  Recommendations were made for CIR.   Complete NIHSS TOTAL: 14  Patient's medical record from [facility] has been reviewed by the rehabilitation admission coordinator and physician.  Past Medical History      Past Medical History:  Diagnosis Date  . CAD (coronary artery disease)   . Chronic anticoagulation   . DM2 (diabetes mellitus, type 2) ([place])   . DVT (deep vein thrombosis) in pregnancy   . HTN (hypertension)   . TIA (transient ischemic attack)     Family History   family history includes Hypertension in his father.  Prior Rehab/Hospitalizations Has the patient had prior rehab or hospitalizations prior to admission? No  Has the patient had major surgery during 100 days prior to admission? No              Current Medications  Current Facility-Administered Medications:  .  acetaminophen (TYLENOL) tablet 650 mg, 650 mg, Oral, Q4H PRN **OR** acetaminophen (TYLENOL) solution 650 mg, 650 mg, Per Tube, Q4H PRN **OR** acetaminophen (TYLENOL) suppository 650 mg, 650 mg, Rectal, Q4H PRN, Mullis, Kiersten P, DO .  apixaban (ELIQUIS) tablet 5 mg, 5 mg, Oral, BID, Meccariello, Bailey J, DO, 5 mg at 06/07/19 1005 .  atorvastatin (LIPITOR) tablet 40 mg, 40 mg, Oral, QHS, Meccariello, Bailey J, DO, 40 mg at 06/06/19 2140 .  clopidogrel (PLAVIX) tablet 75 mg, 75 mg, Oral, Daily, Matilde Haymaker, MD, 75 mg at 06/07/19 1005 .  feeding  supplement (ENSURE ENLIVE) (ENSURE ENLIVE) liquid 237 mL, 237 mL, Oral, TID BM, Dickie La, MD, 237 mL at 06/07/19 1004 .  insulin aspart (novoLOG) injection 0-9 Units, 0-9 Units, Subcutaneous, TID WC, Matilde Haymaker, MD, 3 Units at 06/07/19 0645 .  insulin glargine (LANTUS) injection 10 Units, 10 Units, Subcutaneous, QHS, Mullis, Kiersten P, DO .  pantoprazole (PROTONIX) EC tablet 40 mg, 40 mg, Oral, Daily, Meccariello, Bailey J, DO, 40 mg at 06/07/19 1005  Patients Current Diet:     Diet Order                  DIET DYS 2 Room service appropriate? No; Fluid consistency: Thin  Diet effective now               Precautions / Restrictions Precautions Precautions: Fall Precaution Comments: R side inattention Restrictions Weight Bearing Restrictions: No   Has the patient had 2 or more falls or a fall with injury in the past year? No  Prior Activity Level Limited Community (1-2x/wk): limited mobility, independent with walking stick, but sedentary with stooped posture  Prior Functional Level Self Care: Did the patient need help bathing, dressing, using the toilet or eating? Independent  Indoor Mobility: Did the patient need assistance with walking from room to room (with or without device)? Independent  Stairs: Did the patient need assistance with internal or external stairs (with or without device)? Independent  Functional Cognition: Did the patient need help planning regular tasks such as shopping or remembering to take medications? Needed some help  Home Assistive Devices / Equipment    Prior Device Use: Indicate devices/aids used by the patient prior to current illness, exacerbation or injury? walking stick  Current Functional Level Cognition  Arousal/Alertness: Awake/alert Overall Cognitive Status: Difficult to assess Difficult to assess due to: Impaired communication Current Attention Level: Sustained Orientation Level: Oriented to person, Oriented to  place Following Commands: Follows one step commands consistently, Follows one step commands with increased time Safety/Judgement: Decreased awareness of safety General Comments: pt repeating "wow" for every response this session and did not attempt to verbalize as much as previous session on 6/26 however does follow single step commands    Extremity Assessment (includes Sensation/Coordination)  Upper Extremity Assessment: RUE deficits/detail RUE Deficits /  Details: demonstrates activation of R UE with 2+ out 5 elbow flexion and 3+ out 5 elbow extension. pt with uncoordinated movement but able tofollow command to activate RUE Sensation: decreased proprioception, decreased light touch RUE Coordination: decreased fine motor, decreased gross motor  Lower Extremity Assessment: Defer to PT evaluation RLE Deficits / Details: does not attempt to lift against gravity RLE Sensation: decreased light touch, decreased proprioception RLE Coordination: decreased fine motor, decreased gross motor    ADLs  Overall ADL's : Needs assistance/impaired Grooming: Set up, Sitting Grooming Details (indicate cue type and reason): min cue to wash face and completed iwth L UE Upper Body Bathing: Maximal assistance Lower Body Bathing: +2 for physical assistance, Bed level Upper Body Dressing : Maximal assistance Lower Body Dressing: Total assistance Toilet Transfer: +2 for physical assistance, Minimal assistance Toilet Transfer Details (indicate cue type and reason): sara stedy with (A) to position R LE ( demonstrate buckle and use of knee plate upon standing) pt shifting weight toward the L side to maintain static standing Functional mobility during ADLs: +2 for physical assistance, Maximal assistance General ADL Comments: pt able to sustain static standing in sara stedy with cues for core extension and looking out window as a motivator    Mobility  Overal bed mobility: Needs Assistance Bed Mobility:  Rolling, Sidelying to Sit Rolling: Min guard Sidelying to sit: Mod assist, HOB elevated Supine to sit: +2 for physical assistance, Mod assist Sit to supine: Mod assist, Max assist, +2 for physical assistance General bed mobility comments: assist to bring R LE from EOB, scoot hips to EOB, and to elevate trunk into sitting; cues and assistance to use R UE to push self up into sitting; use of rail     Transfers  Overall transfer level: Needs assistance Equipment used: Rolling walker (2 wheeled) Transfer via Lift Equipment: Stedy Transfers: Sit to/from Stand Sit to Stand: +2 physical assistance, Min assist, +2 safety/equipment, Mod assist Stand pivot transfers: Max assist, +2 physical assistance General transfer comment: pt stood X 2 trials from EOB and X 1 from [place] seat; cues for safe hand placement and hand over hand assist for R UE grip on RW; assist to power up into standing; pt unable to pivot or pick up feet so Stedy standing frame utilized for transfer to recliner; pt is able to stand with min A +2 using standing frame     Ambulation / Gait / Stairs / Wheelchair Mobility  Ambulation/Gait General Gait Details: unable    Posture / Balance Balance Overall balance assessment: Needs assistance Sitting-balance support: Feet supported, Bilateral upper extremity supported Sitting balance-Leahy Scale: Fair Standing balance support: Bilateral upper extremity supported, During functional activity Standing balance-Leahy Scale: Poor Standing balance comment: multimodal cues for hip extension and forward gaze    Special needs/care consideration BiPAP/CPAP no CPM no Continuous Drip IV no Dialysis no        Days n/a Life Vest no Oxygen no Special Bed no Trach Size no Wound Vac (area) no      Location n/a Skin ecchymosis to BLEs                            Bowel mgmt: incontinent, last BM 6/27 Bladder mgmt: incontinent Diabetic mgmt: yes Behavioral consideration no Chemo/radiation  no   Previous Home Environment (from acute therapy documentation) Living Arrangements: Spouse/significant other  Lives With: Spouse Available Help at Discharge: Family, Available PRN/intermittently Type of Home: House  Additional Comments: unsure as patient with unintelligble speech  Discharge Living Setting Plans for Discharge Living Setting: Patient's home Type of Home at Discharge: House Discharge Home Layout: One level Discharge Home Access: Stairs to enter Entrance Stairs-Rails: None Entrance Stairs-Number of Steps: 1+1 Discharge Bathroom Shower/Tub: Walk-in shower Discharge Bathroom Toilet: Handicapped height Discharge Bathroom Accessibility: Yes How Accessible: Accessible via wheelchair Does the patient have any problems obtaining your medications?: No  Social/Family/Support Systems Patient Roles: Spouse Anticipated Caregiver: spouse, Elvyn Krohn Anticipated Caregiver's Contact Information: [name] [phone] Ability/Limitations of Caregiver: supervision Caregiver Availability: 24/7 Discharge Plan Discussed with Primary Caregiver: Yes Is Caregiver In Agreement with Plan?: Yes Does Caregiver/Family have Issues with Lodging/Transportation while Pt is in Rehab?: No  Goals/Additional Needs Patient/Family Goal for Rehab: PT/OT/SLP supervision Expected length of stay: 18-21 days Dietary Needs: D2/thin Equipment Needs: tbd Pt/Family Agrees to Admission and willing to participate: Yes Program Orientation Provided & Reviewed with Pt/Caregiver Including Roles  & Responsibilities: Yes  Decrease burden of Care through IP rehab admission: n/a  Possible need for SNF placement upon discharge: not anticipated  Patient Condition: I have reviewed medical records from [facility], spoken with CM, and patient, spouse and daughter. I met with patient at the bedside and discussed with family via phone for inpatient rehabilitation assessment.  Patient will benefit from  ongoing PT, OT and SLP, can actively participate in 3 hours of therapy a day 5 days of the week, and can make measurable gains during the admission.  Patient will also benefit from the coordinated team approach during an Inpatient Acute Rehabilitation admission.  The patient will receive intensive therapy as well as Rehabilitation physician, nursing, social worker, and care management interventions.  Due to bladder management, bowel management, safety, skin/wound care, disease management, medication administration, pain management and patient education the patient requires 24 hour a day rehabilitation nursing.  The patient is currently mod +2 with mobility and basic ADLs.  Discharge setting and therapy post discharge at home with home health is anticipated.  Patient has agreed to participate in the Acute Inpatient Rehabilitation Program and will admit today.  Preadmission Screen Completed By:  Michel Santee, PT, DPT 06/07/2019 11:09 AM ______________________________________________________________________   Discussed status with Dr. Naaman Plummer on 06/07/19  at 11:09 AM  and received approval for admission today.  Admission Coordinator:  Michel Santee, PT, DPT 06/07/19 /11:09 AM    Assessment/Plan: Diagnosis: left MCA infarct 1. Does the need for close, 24 hr/day Medical supervision in concert with the patient's rehab needs make it unreasonable for this patient to be served in a less intensive setting? Yes 2. Co-Morbidities requiring supervision/potential complications: DM2, DVT, CAD 3. Due to bladder management, bowel management, safety, skin/wound care, disease management, medication administration, pain management and patient education, does the patient require 24 hr/day rehab nursing? Yes 4. Does the patient require coordinated care of a physician, rehab nurse, PT (1-2 hrs/day, 5 days/week), OT (1-2 hrs/day, 5 days/week) and SLP (1-2 hrs/day, 5 days/week) to address physical and functional  deficits in the context of the above medical diagnosis(es)? Yes Addressing deficits in the following areas: balance, endurance, locomotion, strength, transferring, bowel/bladder control, bathing, dressing, feeding, grooming, toileting, cognition, speech, language, swallowing and psychosocial support 5. Can the patient actively participate in an intensive therapy program of at least 3 hrs of therapy 5 days a week? Yes 6. The potential for patient to make measurable gains while on inpatient rehab is excellent 7. Anticipated functional outcomes upon discharge from inpatients  are: supervision PT, supervision OT, supervision SLP 8. Estimated rehab length of stay to reach the above functional goals is: 18-21 days 9. Anticipated D/C setting: Home 10. Anticipated post D/C treatments: [place] therapy 11. Overall Rehab/Functional Prognosis: excellent  MD Signature: Meredith Staggers, MD, [place] [facility] 06/07/2019         Revision History

## 2019-06-07 NOTE — TOC Transition Note (Signed)
Transition of Care [facility]) - CM/SW Discharge Note   Patient Details  Name: Luke Rivera MRN: [id number] Date of Birth: Aug 12, 1941  Transition of Care [facility] CM/SW Contact:  Pollie Friar, RN Phone Number: 06/07/2019, 11:37 AM   Clinical Narrative:    Pt is discharging to CIR today. TOC is signing off.   Final next level of care: IP Rehab Facility Barriers to Discharge: No Barriers Identified   Patient Goals and CMS Choice        Discharge Placement                       Discharge Plan and Services                                     Social Determinants of Health (SDOH) Interventions     Readmission Risk Interventions No flowsheet data found.

## 2019-06-07 NOTE — H&P (Signed)
Physical Medicine and Rehabilitation Admission H&P    Chief Complaint  Patient presents with  .     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  2D echo showed EF 55-60 % without wall abnormalities. TCD negative for PFO.    Wife reported hx of arrhthymias in the past and telemetry revealed frequent PVCs with pauses therefore Dr. Irish Lack consulted for input. He recommended holding BB and increasing home dose Eliquis to 5 mg bid--to monitor for bleeding side effects with plavix on board. He is to follow up with primary cards at [facility] after discharge.    Review of Systems  Unable to perform ROS: Language      Past Medical History:  Diagnosis Date  . CAD (coronary artery disease)   . Chronic anticoagulation   . DM2 (diabetes mellitus, type 2) ([place])   . DVT (deep vein thrombosis) in pregnancy   . HTN (hypertension)   . TIA (transient ischemic attack)     History reviewed. No pertinent surgical history.    Family History  Problem Relation Age of Onset  . Hypertension Father     Social History:  reports that he has quit smoking. He has never used smokeless tobacco. No history on file for alcohol and drug.    Allergies: No Known Allergies    Medications Prior to Admission  Medication Sig Dispense Refill  . apixaban (ELIQUIS) 5 MG TABS tablet Take 2.5 mg by mouth  2 (two) times daily.    Marland Kitchen atorvastatin (LIPITOR) 80 MG tablet Take 40 mg by mouth at bedtime.     . Cholecalciferol (VITAMIN D) 50 MCG (2000 UT) CAPS Take 2,000 Units by mouth 2 (two) times a day.    . clopidogrel (PLAVIX) 75 MG tablet Take 75 mg by mouth daily.    . furosemide (LASIX) 40 MG tablet Take 40 mg by mouth daily as needed for fluid or edema.     . gabapentin (NEURONTIN) 300 MG capsule Take 600 mg by mouth 2 (two) times daily.     . insulin glargine (LANTUS) 100 UNIT/ML injection Inject 30 Units into the skin at bedtime.     Marland Kitchen losartan (COZAAR) 25 MG tablet Take 12.5 mg by mouth every Monday, Wednesday, and Friday.     . metFORMIN (GLUCOPHAGE) 1000 MG tablet Take 1,000 mg by mouth daily with supper.     . metoprolol tartrate (LOPRESSOR) 25 MG tablet Take 12.5 mg by mouth 2 (two) times daily.    . Multiple Vitamin (MULTIVITAMIN WITH MINERALS) TABS tablet Take 1 tablet by mouth daily.    . nitroGLYCERIN (NITROSTAT) 0.6 MG SL tablet Place 0.6 mg under the tongue as needed for chest pain.    Marland Kitchen nystatin (NYSTATIN) powder Apply 1 g topically daily. Apply to skin fold on abdomen.    . pantoprazole (PROTONIX) 40 MG tablet Take 40 mg by mouth daily.    . potassium gluconate (  HM POTASSIUM) 595 (99 K) MG TABS tablet Take 595 mg by mouth daily.    . vitamin B-12 (CYANOCOBALAMIN) 500 MCG tablet Take 500 mcg by mouth every Monday, Wednesday, and Friday.      Drug Regimen Review  Drug regimen was reviewed and remains appropriate with no significant issues identified  Home: Home Living Family/patient expects to be discharged to:: Private residence Living Arrangements: Spouse/significant other Available Help at Discharge: Family, Available PRN/intermittently Type of Home: House Additional Comments: unsure as patient with unintelligble speech  Lives With: Spouse   Functional History: Prior Function Level of Independence: Independent with assistive device(s) Comments: per chart review pt  used "walking stick" but did not move around much; has residual memory impairment with problems processing information at a normal pace.  Functional Status:  Mobility: Bed Mobility Overal bed mobility: Needs Assistance Bed Mobility: Rolling, Sidelying to Sit Rolling: Min guard Sidelying to sit: Mod assist, HOB elevated Supine to sit: +2 for physical assistance, Mod assist Sit to supine: Mod assist, Max assist, +2 for physical assistance General bed mobility comments: assist to bring R LE from EOB, scoot hips to EOB, and to elevate trunk into sitting; cues and assistance to use R UE to push self up into sitting; use of rail  Transfers Overall transfer level: Needs assistance Equipment used: Rolling walker (2 wheeled) Transfer via Lift Equipment: Stedy Transfers: Sit to/from Stand Sit to Stand: +2 physical assistance, Min assist, +2 safety/equipment, Mod assist Stand pivot transfers: Max assist, +2 physical assistance General transfer comment: pt stood X 2 trials from EOB and X 1 from [place] seat; cues for safe hand placement and hand over hand assist for R UE grip on RW; assist to power up into standing; pt unable to pivot or pick up feet so Stedy standing frame utilized for transfer to recliner; pt is able to stand with min A +2 using standing frame  Ambulation/Gait General Gait Details: unable    ADL: ADL Overall ADL's : Needs assistance/impaired Grooming: Set up, Sitting Grooming Details (indicate cue type and reason): min cue to wash face and completed iwth L UE Upper Body Bathing: Maximal assistance Lower Body Bathing: +2 for physical assistance, Bed level Upper Body Dressing : Maximal assistance Lower Body Dressing: Total assistance Toilet Transfer: +2 for physical assistance, Minimal assistance Toilet Transfer Details (indicate cue type and reason): sara stedy with (A) to position R LE ( demonstrate buckle and use of knee plate upon standing) pt shifting weight toward the L  side to maintain static standing Functional mobility during ADLs: +2 for physical assistance, Maximal assistance General ADL Comments: pt able to sustain static standing in sara stedy with cues for core extension and looking out window as a motivator  Cognition: Cognition Overall Cognitive Status: Difficult to assess Arousal/Alertness: Awake/alert Orientation Level: Oriented to person, Oriented to place Cognition Arousal/Alertness: Awake/alert Behavior During Therapy: Flat affect Overall Cognitive Status: Difficult to assess Orientation Level: (able to state we are at [facility]) Current Attention Level: Sustained Following Commands: Follows one step commands consistently, Follows one step commands with increased time Safety/Judgement: Decreased awareness of safety Problem Solving: Requires verbal cues, Requires tactile cues General Comments: pt repeating "wow" for every response this session and did not attempt to verbalize as much as previous session on 6/26 however does follow single step commands Difficult to assess due to: Impaired communication   Blood pressure 132/68, pulse 70, temperature 98.8 F (37.1 C), temperature source Oral, resp. rate 18, height 6' (  1.829 m), weight 122 kg, SpO2 95 %. Physical Exam  Constitutional: No distress.  Large man, sitting in chair with legs crossed   HENT:  Head: Normocephalic.  Eyes: Pupils are equal, round, and reactive to light. Left eye exhibits no discharge.  Neck: Normal range of motion. No tracheal deviation present. No thyromegaly present.  Cardiovascular: Normal rate and regular rhythm.  No murmur heard. Respiratory: Effort normal. No respiratory distress. He has no wheezes.  GI: Soft. He exhibits no distension. There is no abdominal tenderness.  Musculoskeletal:        General: No deformity or edema.  Neurological: He is alert.  Makes eye contact with me. Right central 7. Follows simple commands with verbal and tactile cues.  Word finding deficits, garbled speech. RUE 1/5 prox to distal. RLE 2-/5 prox to 1-2/5 distally. LUE and LLE 5/5. Senses pain right arm and leg.   Skin: Skin is warm. He is not diaphoretic.  Scattered abrasions right arm and leg  Psychiatric:  Pt flat, distracted    Results for orders placed or performed during the hospital encounter of 05/31/19 (from the past 48 hour(s))  Glucose, capillary     Status: Abnormal   Collection Time: 06/05/19  4:05 PM  Result Value Ref Range   Glucose-Capillary 240 (H) 70 - 99 mg/dL   Comment 1 Notify RN    Comment 2 Document in Chart   Glucose, capillary     Status: Abnormal   Collection Time: 06/05/19  9:22 PM  Result Value Ref Range   Glucose-Capillary 199 (H) 70 - 99 mg/dL  Glucose, capillary     Status: Abnormal   Collection Time: 06/06/19  6:06 AM  Result Value Ref Range   Glucose-Capillary 187 (H) 70 - 99 mg/dL  Basic metabolic panel     Status: Abnormal   Collection Time: 06/06/19  6:11 AM  Result Value Ref Range   Sodium 139 135 - 145 mmol/L   Potassium 3.7 3.5 - 5.1 mmol/L   Chloride 103 98 - 111 mmol/L   CO2 24 22 - 32 mmol/L   Glucose, Bld 213 (H) 70 - 99 mg/dL   BUN 17 8 - 23 mg/dL   Creatinine, Ser 0.93 0.61 - 1.24 mg/dL   Calcium 9.0 8.9 - 10.3 mg/dL   GFR calc non Af Amer >60 >60 mL/min   GFR calc Af Amer >60 >60 mL/min   Anion gap 12 5 - 15    Comment: Performed at [facility], [address], [place], [place] [address]  Glucose, capillary     Status: Abnormal   Collection Time: 06/06/19 11:11 AM  Result Value Ref Range   Glucose-Capillary 231 (H) 70 - 99 mg/dL   Comment 1 Notify RN    Comment 2 Document in Chart   Glucose, capillary     Status: Abnormal   Collection Time: 06/06/19  3:44 PM  Result Value Ref Range   Glucose-Capillary 259 (H) 70 - 99 mg/dL   Comment 1 Notify RN    Comment 2 Document in Chart   Glucose, capillary     Status: Abnormal   Collection Time: 06/06/19  9:14 PM  Result Value Ref Range    Glucose-Capillary 255 (H) 70 - 99 mg/dL  Glucose, capillary     Status: Abnormal   Collection Time: 06/07/19  6:11 AM  Result Value Ref Range   Glucose-Capillary 209 (H) 70 - 99 mg/dL  Glucose, capillary     Status:  Abnormal   Collection Time: 06/07/19 11:22 AM  Result Value Ref Range   Glucose-Capillary 264 (H) 70 - 99 mg/dL   Comment 1 Notify RN    Comment 2 Document in Chart    No results found.      Medical Problem List and Plan: 1.  Functional and cognitive deficits secondary to left MCA infarct  -admit to inpatient rehab 2.  Antithrombotics: -DVT/anticoagulation:  Pharmaceutical: Other (comment)--Eliquis  -antiplatelet therapy: Plavix 3. Pain Management: tylenol prn 4. Mood: LCSW to follow for evaluation and support.   -antipsychotic agents: N/A 5. Neuropsych: This patient is not capable of making decisions on his own behalf. 6. Skin/Wound Care: Routine pressure relief measures 7. Fluids/Electrolytes/Nutrition: Monitor I/O. Check lytes in am.Hypokalemia resolved past supplementation 8. CAD: On Lipitor and Plavix --no BB due to intermittent pauses.   9. T2DM with neuropathy: Monitor BS ac/hs. Was on metformin, Glipizide and Lantus at home--currently Lantus 10 units at bedtime.  Will resume metformin bid for meal coverage.  10.Hypoxia: Has been weaned off oxygen. Continue to monitor for recurrent fevers. 11. LUL pulmonary nodule: follow up CT in 6-12 months       Bary Leriche, PA-C 06/07/2019

## 2019-06-08 ENCOUNTER — Inpatient Hospital Stay (HOSPITAL_COMMUNITY): Payer: Medicare HMO | Admitting: Physical Therapy

## 2019-06-08 ENCOUNTER — Inpatient Hospital Stay (HOSPITAL_COMMUNITY): Payer: Medicare HMO | Admitting: Speech Pathology

## 2019-06-08 ENCOUNTER — Inpatient Hospital Stay (HOSPITAL_COMMUNITY): Payer: Medicare HMO | Admitting: Occupational Therapy

## 2019-06-08 DIAGNOSIS — E871 Hypo-osmolality and hyponatremia: Secondary | ICD-10-CM

## 2019-06-08 DIAGNOSIS — R4701 Aphasia: Secondary | ICD-10-CM

## 2019-06-08 LAB — COMPREHENSIVE METABOLIC PANEL
ALT: 18 U/L (ref 0–44)
AST: 16 U/L (ref 15–41)
Albumin: 3.5 g/dL (ref 3.5–5.0)
Alkaline Phosphatase: 70 U/L (ref 38–126)
Anion gap: 11 (ref 5–15)
BUN: 19 mg/dL (ref 8–23)
CO2: 23 mmol/L (ref 22–32)
Calcium: 8.8 mg/dL — ABNORMAL LOW (ref 8.9–10.3)
Chloride: 101 mmol/L (ref 98–111)
Creatinine, Ser: 0.84 mg/dL (ref 0.61–1.24)
GFR calc Af Amer: >60 mL/min (ref >60)
GFR calc non Af Amer: >60 mL/min (ref >60)
Glucose, Bld: 220 mg/dL — ABNORMAL HIGH (ref 70–99)
Potassium: 3.6 mmol/L (ref 3.5–5.1)
Sodium: 135 mmol/L (ref 135–145)
Total Bilirubin: 1.8 mg/dL — ABNORMAL HIGH (ref 0.3–1.2)
Total Protein: 6.6 g/dL (ref 6.5–8.1)

## 2019-06-08 LAB — CBC WITH DIFFERENTIAL/PLATELET
Abs Immature Granulocytes: 0.18 10*3/uL — ABNORMAL HIGH (ref 0.00–0.07)
Basophils Absolute: 0.1 10*3/uL (ref 0.0–0.1)
Basophils Relative: 1 %
Eosinophils Absolute: 0.1 10*3/uL (ref 0.0–0.5)
Eosinophils Relative: 1 %
HCT: 37.9 % — ABNORMAL LOW (ref 39.0–52.0)
Hemoglobin: 12.8 g/dL — ABNORMAL LOW (ref 13.0–17.0)
Immature Granulocytes: 2 %
Lymphocytes Relative: 19 %
Lymphs Abs: 1.6 10*3/uL (ref 0.7–4.0)
MCH: 28.7 pg (ref 26.0–34.0)
MCHC: 33.8 g/dL (ref 30.0–36.0)
MCV: 85 fL (ref 80.0–100.0)
Monocytes Absolute: 0.6 10*3/uL (ref 0.1–1.0)
Monocytes Relative: 7 %
Neutro Abs: 6 10*3/uL (ref 1.7–7.7)
Neutrophils Relative %: 70 %
Platelets: 290 10*3/uL (ref 150–400)
RBC: 4.46 MIL/uL (ref 4.22–5.81)
RDW: 15.5 % (ref 11.5–15.5)
WBC: 8.6 10*3/uL (ref 4.0–10.5)
nRBC: 0 % (ref 0.0–0.2)

## 2019-06-08 LAB — GLUCOSE, CAPILLARY
Glucose-Capillary: 228 mg/dL — ABNORMAL HIGH (ref 70–99)
Glucose-Capillary: 255 mg/dL — ABNORMAL HIGH (ref 70–99)
Glucose-Capillary: 229 mg/dL — ABNORMAL HIGH (ref 70–99)
Glucose-Capillary: 232 mg/dL — ABNORMAL HIGH (ref 70–99)

## 2019-06-08 MED ORDER — INSULIN GLARGINE 100 UNIT/ML ~~LOC~~ SOLN
15.0000 [IU] | Freq: Every day | SUBCUTANEOUS | Status: DC
  Administered 2019-06-08: 15 [IU] via SUBCUTANEOUS
  Filled 2019-06-08 (×2): qty 0.15

## 2019-06-08 NOTE — Evaluation (Signed)
Speech Language Pathology Assessment and Plan  Patient Details  Name: Gevon Markus MRN: [id number] Date of Birth: 1941/08/27  SLP Diagnosis: Aphasia;Dysarthria  Rehab Potential: Good ELOS:      Today's Date: 06/08/2019 SLP Individual Time: 1100-1200 SLP Individual Time Calculation (min): 60 min   Problem List:  Patient Active Problem List   Diagnosis Date Noted  . Acute ischemic right MCA stroke ([place]) 06/07/2019  . AMS (altered mental status)   . Incidental pulmonary nodule, > 17m and < 850m06/23/2020  . [place]ce[place]) [place]  . 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 Onychomycosis 01/11/2015  . Pain in lower limb 01/11/2015  . Hammer toe of right foot 01/02/2015  . Pain in right foot 01/02/2015   Past Medical History:  Past Medical History:  Diagnosis Date  . CAD (coronary artery disease)   . CHF (congestive heart failure) ([place]  . Chronic anticoagulation   . DM2 (diabetes [place], type 2) ([place]  . DVT (deep vein thrombosis) in pregnancy   . GERD (gastroesophageal reflux disease)   . HTN (hypertension)   . TIA (transient ischemic attack)    Past Surgical History: History reviewed. No pertinent surgical history.  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 MRI: patchy acute infarcts are present in the left MCA territory with the most confluent area of infarction involving the posterior left frontal lobe including precentral gyrus. Smaller acute infarcts involve left parietal and left temporal lobe cortex as well as left basal ganglia.  MBS on 6/23 revealed "mild oropharyngeal dysphagia characterized by decreased oral propulsion/impaired mastication and a delay to the  vallecular space with thin via tsp; delay to pyriform sinus with nectar consistency also noted; straw sips were attempted with thin/nectar, but unsuccessful d/t decreased mentation (ie: biting on straw despite mod verbal cues); no penetration/aspiration noted during study; pt's mentation decreased overall during exam,so initiation of a conservative diet of Dysphagia 2/thin via tsp recommended with ST f/u for diet tolerance and advancement." Pt's diet was advanced to Dys 3/thin while in acute care.    Assessment of aphasia reveals a primary nonfluent, expressive aphasia with a milder receptive component.  Pt was able to recite most automatic sequences, repeat single words and some simple sentences ("you know how"); confrontation and responsive naming tasks were completed with 80% accuracy.  He was able to read single words and short sentences aloud with occasional paraphasic substitutions.  He followed one step commands and discriminated left/right with 90% accuracy; simple questions requiring yes/no were answered accurately, but pt had a tendency to attempt to explain answers rather than providing a single word response.  Pt initiated most propositional speech with "well..." - dysfluencies during novel communication were significant, with reduced word retrieval and reduced use of grammatic forms.  Attempts at longer utterances were significantly unintelligible; there is a component of dysarthria.    Dysphagia appears to be primarily oral in nature with ongoing right sensorimotor deficits and right inattention.  There is intermittent spillage from right oral cavity and pocketing in right lateral sulcus. Pt appears to be protecting his airway with minimal risk of aspiration.  He will require intermittent supervision with meals and assistance with tray set-up.   Pt  will benefit from intensive aphasia therapy to address functional communication - he demonstrates emerging abilities to identify and self-correct  errors and shows great potential for progress with his speech.  Dysphagia has improved since initial stroke, is currently mild and will likely require minimal intervention.           SLP Assessment  Patient will need skilled Speech Lanaguage Pathology Services during CIR admission    Recommendations  SLP Diet Recommendations: Dysphagia 3 (Mech soft);Thin Liquid Administration via: Cup;Straw Medication Administration: Crushed with puree Supervision: Intermittent supervision to cue for compensatory strategies Compensations: Minimize environmental distractions;Lingual sweep for clearance of pocketing Postural Changes and/or Swallow Maneuvers: Seated upright 90 degrees Oral Care Recommendations: Oral care BID Patient destination: Home Equipment Recommended: None recommended by SLP    SLP Frequency 3 to 5 out of 7 days   SLP Duration  SLP Intensity  SLP Treatment/Interventions  two weeks  Minumum of 1-2 x/day, 30 to 90 minutes  Dysphagia/aspiration precaution training;Environmental controls;Speech/Language facilitation;Internal/external aids;Patient/family education;Functional tasks    Pain Pain Assessment Pain Scale: Faces Faces Pain Scale: Hurts little more Pain Type: Acute pain Pain Location: Leg(appeared to be located in knee but pt unable to verbalize due to aphasia) Pain Orientation: Right Pain Onset: With Activity(during PROM) Pain Intervention(s): Other (Comment);Rest;Repositioned(therapy to tolerance)  Prior Functioning Cognitive/Linguistic Baseline: Baseline deficits Baseline deficit details: Memory deficits per wife in chart review Type of Home: House  Lives With: Spouse Available Help at Discharge: Family;Available PRN/intermittently Vocation: Retired  Industrial/product designer Term Goals: Week 1: SLP Short Term Goal 1 (Week 1): Pt will construct noun + verb utterances when describing pictures with 80% accuracy and multimodal cues. SLP Short Term Goal 2 (Week 1): Pt will  respond to questions with a clear/intelligible YES/NO on 8/10 opportunities. SLP Short Term Goal 3 (Week 1): Pt will listen to short, simple paragraphs and respond to yes/no questions with 80% accuracy. SLP Short Term Goal 4 (Week 1): Pt will attend to right side of mouth and clear oral residue with lingual sweep on 5/7 opportunities independently.  Refer to Care Plan for Long Term Goals  Recommendations for other services: None   Discharge Criteria: Patient will be discharged from SLP if patient refuses treatment 3 consecutive times without medical reason, if treatment goals not met, if there is a change in medical status, if patient makes no progress towards goals or if patient is discharged from hospital.  The above assessment, treatment plan, treatment alternatives and goals were discussed and mutually agreed upon: by patient  Assunta Curtis 06/08/2019, 4:53 PM

## 2019-06-08 NOTE — Care Management Note (Signed)
Inpatient [facility] Individual Statement of Services  Patient Name:  Luke Rivera  Date:  06/08/2019  Welcome to the [facility].  Our goal is to provide you with an individualized program based on your diagnosis and situation, designed to meet your specific needs.  With this comprehensive rehabilitation program, you will be expected to participate in at least 3 hours of rehabilitation therapies Monday-Friday, with modified therapy programming on the weekends.  Your rehabilitation program will include the following services:  Physical Therapy (PT), Occupational Therapy (OT), Speech Therapy (ST), 24 hour per day rehabilitation nursing, Neuropsychology, Case Management (Social Worker), Rehabilitation Medicine, Nutrition Services and Pharmacy Services  Weekly team conferences will be held on Wedensday to discuss your progress.  Your Social Worker will talk with you frequently to get your input and to update you on team discussions.  Team conferences with you and your family in attendance may also be held.  Expected length of stay: 3-3.5 weeks  Overall anticipated outcome: min assist level  Depending on your progress and recovery, your program may change. Your Social Worker will coordinate services and will keep you informed of any changes. Your Social Worker's name and contact numbers are listed  below.  The following services may also be recommended but are not provided by the [place]:    [place] will be made to provide these services after discharge if needed.  Arrangements include referral to agencies that provide these services.  Your insurance has been verified to be:  [employer] Your primary doctor is:  Yong Channel  Pertinent information will be shared with your doctor and your insurance company.  Social Worker:  Ovidio Kin, [place] or  [phone]  Information discussed with and copy given to patient by: Elease Hashimoto, 06/08/2019, 1:59 PM

## 2019-06-08 NOTE — Evaluation (Signed)
Occupational Therapy Assessment and Plan  Patient Details  Name: Luke Rivera MRN: [id number] Date of Birth: [birth year]  OT Diagnosis: abnormal posture, apraxia, cognitive deficits, hemiplegia affecting dominant side and muscle weakness (generalized) Rehab Potential: Rehab Potential (ACUTE ONLY): Good ELOS: 21-23 days   Today's Date: 06/08/2019 OT Individual Time: 4970-2637 OT Individual Time Calculation (min): 75 min     Problem List:  Patient Active Problem List   Diagnosis Date Noted  . Acute ischemic right MCA stroke ([place]) 06/07/2019  . AMS (altered mental status)   . Incidental pulmonary nodule, > 76m and < 812m06/23/2020  . CVA (cerebral vascular accident) ([facility]06/22/2020  . 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 Onychomycosis 01/11/2015  . Pain in lower limb 01/11/2015  . Hammer toe of right foot 01/02/2015  . Pain in right foot 01/02/2015    Past Medical History:  Past Medical History:  Diagnosis Date  . CAD (coronary artery disease)   . CHF (congestive heart failure) ([facility]  . Chronic anticoagulation   . DM2 (diabetes mellitus, type 2) ([facility]  . DVT (deep vein thrombosis) in pregnancy   . GERD (gastroesophageal reflux disease)   . HTN (hypertension)   . TIA (transient ischemic attack)    Past Surgical History: History reviewed. No pertinent surgical history.  Assessment & Plan Clinical Impression:Ammon BaLanges an [age]ear old male with history of 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  2D echo showed EF 55-60 % without wall abnormalities. TCD negative for PFO.    Wife reported hx of arrhthymias in the past and telemetry revealed frequent PVCs with pauses therefore Dr. VaIrish Lackonsulted for input. He recommended holding BB and increasing home dose Eliquis to 5 mg bid--to monitor for bleeding side effects with plavix on board. [facility]    Patient transferred to CIR on 06/07/2019 .    Patient currently requires max with basic self-care skills secondary to muscle weakness, decreased cardiorespiratoy endurance, abnormal tone and decreased motor planning, decreased midline orientation, decreased problem solving and decreased memory and decreased standing balance, decreased postural control, hemiplegia and decreased balance strategies.  Prior to hospitalization, patient could complete BADLs with modified independent .  Patient will benefit from skilled intervention to increase independence with basic self-care skills prior to discharge home with care partner.  Anticipate patient will require minimal physical assistance and follow up home health.  OT - End of Session Activity Tolerance: Tolerates 10 - 20 min activity with multiple rests Endurance Deficit: Yes Endurance Deficit Description: fatigues quickly and easily OT Assessment Rehab Potential (ACUTE ONLY): Good OT Patient demonstrates impairments in the following area(s): Balance;Cognition;Endurance;Motor;Perception;Safety;Sensory OT Basic ADL's Functional Problem(s): Grooming;Bathing;Dressing;Toileting OT Transfers Functional Problem(s): Toilet;Tub/Shower OT Additional Impairment(s): Fuctional Use of Upper Extremity OT Plan OT Intensity: Minimum of 1-2 x/day, 45 to 90 minutes OT Frequency: 5 out of 7 days OT Duration/Estimated Length of Stay: 21-23 days OT  Treatment/Interventions: Balance/vestibular training;Cognitive remediation/compensation;Discharge planning;DME/adaptive equipment instruction;Disease mangement/prevention;Functional mobility training;Pain management;Neuromuscular re-education;Patient/family education;Psychosocial support;Self Care/advanced ADL retraining;Therapeutic Activities;UE/LE Strength taining/ROM;Therapeutic Exercise;UE/LE Coordination activities;Visual/perceptual remediation/compensation;Splinting/orthotics OT Self Feeding Anticipated Outcome(s): Independent OT Basic Self-Care Anticipated Outcome(s): Min A overall OT Toileting Anticipated Outcome(s): Min A overall OT Bathroom Transfers Anticipated Outcome(s): Min A overall OT Recommendation Patient destination: Home Follow Up Recommendations: Home health OT Equipment Recommended: Tub/shower bench;To be determined;3 in 1 bedside comode   Skilled Therapeutic Intervention Pt seen for initial evaluation and ADL training with a focus on use of RUE and balance in sitting.  See ADL documentation below for details.   Discussed role of OT, pt's goals, POC, ELOS.  Pt nodded head in agreement.  He does have some cognitive impairments but followed basic directions well and seemed to understand what I was explaining. Pt resting in wc with quick release belt on and all needs met.   OT Evaluation Precautions/Restrictions  Precautions Precautions: Fall Restrictions Weight Bearing Restrictions: No   Pain Pain Assessment Pain Scale: 0-10 Pain Score: 0-No pain Home Living/Prior Functioning Home Living Available Help at Discharge: Family, Available PRN/intermittently Type of Home: House Bathroom Shower/Tub: Tub/shower unit  Lives With: Spouse Prior Function Level of Independence: Independent with basic ADLs, Independent with gait, Requires assistive device for independence Vocation: Retired Comments: per chart review pt used "walking stick" but did not move around much; has  residual memory impairment with problems processing information at a normal pace. ADL ADL Grooming: Minimal assistance Upper Body Bathing: Minimal assistance Where Assessed-Upper Body Bathing: Edge of bed Lower Body Bathing: Moderate assistance Where Assessed-Lower Body Bathing: Edge of bed Upper Body Dressing: Moderate assistance Lower Body Dressing: Dependent Toileting: Dependent Where Assessed-Toileting: Bedside Commode Toilet Transfer: Other (comment)(+2 stedy lift) Toilet Transfer Equipment: Bedside commode Vision Baseline Vision/History: Wears glasses Wears Glasses: At all times Patient Visual Report: No change from baseline Vision Assessment?: Yes Eye Alignment: Within Functional Limits Ocular Range of Motion: Within Functional Limits Alignment/Gaze Preference: Within Defined Limits Tracking/Visual Pursuits: Decreased smoothness of horizontal tracking;Decreased smoothness of vertical tracking Visual Fields: No apparent deficits Additional Comments: difficult to assess due to limited expression and cognitive impairments, no significant deficits observed Perception  Perception: Impaired Inattention/Neglect: Other (comment)(decreased midline awareness) Praxis Praxis: Impaired Praxis Impairment Details: Motor planning Cognition Overall Cognitive Status: Difficult to assess Arousal/Alertness: Awake/alert Orientation Level: Person;Place;Situation Person: Oriented Place: Oriented(stated clearly "[facility]") Situation: Oriented Year: 2020 Month: July Day of Week: Correct Memory: Impaired Memory Impairment: Storage deficit;Decreased recall of new information Immediate Memory Recall: Sock;Blue;Bed Memory Recall Sock: With Cue Memory Recall Blue: Without Cue Memory Recall Bed: Without Cue Awareness: Impaired Awareness Impairment: Intellectual impairment Problem Solving: Impaired Problem Solving Impairment: Verbal basic;Functional  basic Sensation Sensation Light Touch: Impaired Detail Peripheral sensation comments: history of peripheral sensation loss in feet from DM Hot/Cold: Appears Intact Proprioception: Impaired by gross assessment Stereognosis: Not tested Coordination Gross Motor Movements are Fluid and Coordinated: No Fine Motor Movements are Fluid and Coordinated: No Coordination and Movement Description: R hemiplegia Finger Nose Finger Test: Pt unable to reach finger to nose Motor  Motor Motor: Hemiplegia;Abnormal tone Mobility    +2 for transfers Trunk/Postural Assessment  Thoracic Assessment Thoracic Assessment: Exceptions to WFL(rounded shoulders) Lumbar Assessment Lumbar Assessment: Exceptions to WFL(posterior pelvic tilt) Postural Control Postural Control: Deficits on evaluation Trunk Control: R lean when is semi stand in stedy and in standing  Balance Static Sitting Balance Static Sitting - Level of Assistance: 5: Stand by assistance Dynamic Sitting Balance  Dynamic Sitting - Level of Assistance: 4: Min assist Static Standing Balance Static Standing - Level of Assistance: 1: +2 Total assist Extremity/Trunk Assessment RUE Assessment RUE Assessment: Exceptions to [facility] Passive Range of Motion (PROM) Comments: WFL distally, limited shoulder due to tightness Active Range of Motion (AROM) Comments: 20 degrees of sh flexion, 90 elbow flexion, 50% of grasp RUE Body System: Neuro Brunstrum levels for arm and hand: Arm;Hand Brunstrum level for arm: Stage II Synergy is developing Brunstrum level for hand: Stage II Synergy is developing RUE Tone RUE Tone: Mild;Hypertonic Hypertonic Details: mild hypertone in elbow flexors LUE Assessment LUE Assessment: Within Functional Limits     Refer to Care Plan for Long Term Goals  Recommendations for other services: None    Discharge Criteria: Patient will be discharged from OT if patient refuses treatment 3 consecutive times without medical reason,  if treatment goals not met, if there is a change in medical status, if patient makes no progress towards goals or if patient is discharged from hospital.  The above assessment, treatment plan, treatment alternatives and goals were discussed and mutually agreed upon: by patient  [place] 06/08/2019, 1:11 PM

## 2019-06-08 NOTE — Discharge Instructions (Addendum)
Inpatient Rehab Discharge Instructions  Luke Rivera Discharge date and time:  07/02/19  Activities/Precautions/ Functional Status: Activity: no lifting, driving, or strenuous exercise till cleared by MD Diet: diabetic diet and low fat, low cholesterol diet-- Chopped up/soft foods Wound Care: N/A   Functional status:  ___ No restrictions     ___ Walk up steps independently _X__ 24/7 supervision/assistance   ___ Walk up steps with assistance ___ Intermittent supervision/assistance  ___ Bathe/dress independently ___ Walk with walker     _X__ Bathe/dress with assistance ___ Walk Independently    ___ Shower independently ___ Walk with assistance    ___ Shower with assistance _X__ No alcohol     ___ Return to work/school ________  COMMUNITY REFERRALS UPON DISCHARGE:   Home Health:   PT     OT     ST     RN    Agency:  [facility]:  [phone] Medical Equipment/Items Ordered:  20"x18" wheelchair with basic cushion; bedside commode; tub transfer bench  Agency/Supplier:  [facility] PATIENT/FAMILY: Support Groups:  [place] [facility] - on hold for COVID-19 right now                              Meets the second Thursday of each month from 6 - 7 PM (except June, July, August)                              [facility] [facility]. [facility], [address], [place]                              For more information, call [phone]  Special Instructions: 1. Monitor blood sugars before meals and at bedtime. IF blood sugar is below 100--have a small snack. Contact PCP for input regarding adjustment of insulin/diabetic medications.  2. Apply blue emu or voltaren to bilateral knees at least 3 times a day for pain control. 3. Use biotene after meals for oral care/ulcers that are healing. Do not drink for 30 minutes after using oral rinse.     STROKE/TIA DISCHARGE INSTRUCTIONS SMOKING Cigarette  smoking nearly doubles your risk of having a stroke & is the single most alterable risk factor  If you smoke or have smoked in the last 12 months, you are advised to quit smoking for your health.  Most of the excess cardiovascular risk related to smoking disappears within a year of stopping.  Ask you doctor about anti-smoking medications  [place] Quit Line: 1-800-QUIT NOW  Free Smoking Cessation Classes [phone]  CHOLESTEROL Know your levels; limit fat & cholesterol in your diet  Lipid Panel     Component Value Date/Time   CHOL 107 06/01/2019 0736   TRIG 128 06/01/2019 0736   HDL 32 (L) 06/01/2019 0736   CHOLHDL 3.3 06/01/2019 0736   VLDL 26 06/01/2019 0736   LDLCALC 49 06/01/2019 0736      Many patients benefit from treatment even if their cholesterol is at goal.  Goal: Total Cholesterol (CHOL) less than 160  Goal:  Triglycerides (TRIG) less than 150  Goal:  HDL greater than 40  Goal:  LDL (LDLCALC) less than 100   BLOOD PRESSURE American Stroke Association blood pressure target is  less that 120/80 mm/Hg  Your discharge blood pressure is:  BP: (!) 121/55  Monitor your blood pressure  Limit your salt and alcohol intake  Many individuals will require more than one medication for high blood pressure  DIABETES (A1c is a blood sugar average for last 3 months) Goal HGBA1c is under 7% (HBGA1c is blood sugar average for last 3 months)  Diabetes:     Lab Results  Component Value Date   HGBA1C 7.3 (H) 06/01/2019     Your HGBA1c can be lowered with medications, healthy diet, and exercise.  Check your blood sugar as directed by your physician  Call your physician if you experience unexplained or low blood sugars.  PHYSICAL ACTIVITY/REHABILITATION Goal is 30 minutes at least 4 days per week  Activity: No driving, Therapies: see above Return to work: N/A  Activity decreases your risk of heart attack and stroke and makes your heart stronger.  It helps control your weight  and blood pressure; helps you relax and can improve your mood.  Participate in a regular exercise program.  Talk with your doctor about the best form of exercise for you (dancing, walking, swimming, cycling).  DIET/WEIGHT Goal is to maintain a healthy weight  Your discharge diet is:  Diet Order            DIET DYS 3 Room service appropriate? Yes; Fluid consistency: Thin  Diet effective now             liquids Your height is:  Height: 6' (182.9 cm) Your current weight is: 235 LBS Your Body Mass Index (BMI) is:  BMI (Calculated): 32.11  Following the type of diet specifically designed for you will help prevent another stroke.  Your goal weight is:  184 lbs  Your goal Body Mass Index (BMI) is 19-24.  Healthy food habits can help reduce 3 risk factors for stroke:  High cholesterol, hypertension, and excess weight.  RESOURCES Stroke/Support Group:  Call [phone]   STROKE EDUCATION PROVIDED/REVIEWED AND GIVEN TO PATIENT Stroke warning signs and symptoms How to activate emergency medical system (call 911). Medications prescribed at discharge. Need for follow-up after discharge. Personal risk factors for stroke. Pneumonia vaccine given:  Flu vaccine given:  My questions have been answered, the writing is legible, and I understand these instructions.  I will adhere to these goals & educational materials that have been provided to me after my discharge from the hospital.    My questions have been answered and I understand these instructions. I will adhere to these goals and the provided educational materials after my discharge from the hospital.  Patient/Caregiver Signature _______________________________ Date __________  Clinician Signature _______________________________________ Date __________  Please bring this form and your medication list with you to all your follow-up doctor's appointments. Information on my medicine     ELIQUIS (apixaban)  Why was Eliquis  prescribed for you? Eliquis was prescribed for you to reduce the risk of a blood clot forming that can cause a stroke if you have a medical condition called atrial fibrillation (a type of irregular heartbeat).  What do You need to know about Eliquis ? Take your Eliquis TWICE DAILY - one tablet in the morning and one tablet in the evening with or without food. If you have difficulty swallowing the tablet whole please discuss with your pharmacist how to take the medication safely.  Take Eliquis exactly as prescribed by your doctor and DO NOT stop taking Eliquis without talking to the doctor who prescribed  the medication.  Stopping may increase your risk of developing a stroke.  Refill your prescription before you run out.  After discharge, you should have regular check-up appointments with your healthcare provider that is prescribing your Eliquis.  In the future your dose may need to be changed if your kidney function or weight changes by a significant amount or as you get older.  What do you do if you miss a dose? If you miss a dose, take it as soon as you remember on the same day and resume taking twice daily.  Do not take more than one dose of ELIQUIS at the same time to make up a missed dose.  Important Safety Information A possible side effect of Eliquis is bleeding. You should call your healthcare provider right away if you experience any of the following: ? Bleeding from an injury or your nose that does not stop. ? Unusual colored urine (red or dark brown) or unusual colored stools (red or black). ? Unusual bruising for unknown reasons. ? A serious fall or if you hit your head (even if there is no bleeding).  Some medicines may interact with Eliquis and might increase your risk of bleeding or clotting while on Eliquis. To help avoid this, consult your healthcare provider or pharmacist prior to using any new prescription or non-prescription medications, including herbals, vitamins,  non-steroidal anti-inflammatory drugs (NSAIDs) and supplements.  This website has more information on Eliquis (apixaban): http://[email or web address].com/eliquis/home

## 2019-06-08 NOTE — Progress Notes (Signed)
Orthopedic Tech Progress Note Patient Details:  Luke Rivera Jan 13, 1941 [id number] Called in orders to [facility] for a right WHO and a PRAFO boot Patient ID: Luke Rivera, male   DOB: Feb 11, 1941, 78 y.o.   MRN: [id number]   Janit Pagan 06/08/2019, 9:53 AM

## 2019-06-08 NOTE — Progress Notes (Signed)
[facility] PHYSICAL MEDICINE & REHABILITATION PROGRESS NOTE   Subjective/Complaints: Pt had fair night. Was asleep when I arrived. Awoke easily  ROS: limited due to language/communication    Objective:   No results found. Recent Labs    06/08/19 0611  WBC 8.6  HGB 12.8*  HCT 37.9*  PLT 290   Recent Labs    06/06/19 0611 06/08/19 0611  NA 139 135  K 3.7 3.6  CL 103 101  CO2 24 23  GLUCOSE 213* 220*  BUN 17 19  CREATININE 0.93 0.84  CALCIUM 9.0 8.8*    Intake/Output Summary (Last 24 hours) at 06/08/2019 1246 Last data filed at 06/08/2019 0923 Gross per 24 hour  Intake 600 ml  Output 650 ml  Net -50 ml     Physical Exam: Vital Signs Blood pressure 133/70, pulse 82, temperature 98.2 F (36.8 C), temperature source Oral, resp. rate 15, height 6' (1.829 m), weight 116.3 kg, SpO2 92 %.  Constitutional: No distress . Vital signs reviewed. obese HEENT: EOMI, oral membranes moist Neck: supple Cardiovascular: RRR without murmur. No JVD    Respiratory: CTA Bilaterally without wheezes or rales. Normal effort    GI: BS +, non-tender, non-distended   Musculoskeletal:  General: No deformityor edema.  Neurological: awoke easily. Garbled speech, expressive language deficits. Followed basic commands.  RUE 1/5 prox to 1+/5 wrist, hand. Marland Kitchen RLE 2-/5 prox to 1-2/5 distally with inconsistent engagement, apraxia. LUE and LLE 5/5. Senses pain right arm and leg. Skin: Skin iswarm. He isnot diaphoretic. Scattered abrasions right arm and leg remain Psychiatric: remains a little distracted   Assessment/Plan: 1. 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  Physiatrist is providing close team supervision and 24 hour management of active medical problems listed below.  Physiatrist and rehab team continue to assess barriers to discharge/monitor patient progress  toward functional and medical goals  Care Tool:  Bathing    Body parts bathed by patient: Chest, Abdomen, Front perineal area, Right arm, Right upper leg, Left upper leg, Face   Body parts bathed by helper: Buttocks, Left arm, Right lower leg, Left lower leg     Bathing assist Assist Level: Moderate Assistance - Patient 50 - 74%     Upper Body Dressing/Undressing Upper body dressing   What is the patient wearing?: Pull over shirt    Upper body assist Assist Level: Moderate Assistance - Patient 50 - 74%    Lower Body Dressing/Undressing Lower body dressing      What is the patient wearing?: Incontinence brief, Pants     Lower body assist Assist for lower body dressing: Total Assistance - Patient < 25%     Toileting Toileting    Toileting assist Assist for toileting: 2 Helpers     Transfers Chair/bed transfer  Transfers assist           Locomotion Ambulation   Ambulation assist              Walk 10 feet activity   Assist           Walk 50 feet activity   Assist           Walk 150 feet activity   Assist           Walk 10 feet on uneven surface  activity   Assist           Wheelchair  Assist               Wheelchair 50 feet with 2 turns activity    Assist            Wheelchair 150 feet activity     Assist          Medical Problem List and Plan: 1.Functional and cognitive deficitssecondary to left MCA infarct Patient is beginning CIR therapies today including PT and OT  2. Antithrombotics: -DVT/anticoagulation:Pharmaceutical:Other (comment)--Eliquis -antiplatelet therapy: Plavix 3. Pain Management:tylenol prn 4. Mood:LCSW to follow for evaluation and support. -antipsychotic agents: N/A 5. Neuropsych: This patientis notcapable of making decisions on hisown behalf. 6. Skin/Wound Care:Routine pressure relief measures 7.  Fluids/Electrolytes/Nutrition:intake poor over the last 48+ hours  -I personally reviewed the patient's labs today.    -potassium now normal 3.6  -encourage PO, needs supervision  -will ask RD for recs to help boost nutritional status  -may need supplemental nutrition  -check daily weights 8. CAD: On Lipitor and Plavix --no BB due to intermittent pauses.  9. T2DM with neuropathy: Monitor BS ac/hs. Was on metformin, Glipizide and Lantus at home--currently Lantus 10 units at bedtime.   -cbgs uncontrolled despite poor po intake  -resumed metformin bid for meal coverage.   -increase lantus to 15u hs 10.Hypoxia: Has been weaned off oxygen. Continue to monitor for recurrent fevers. 11. LUL pulmonary nodule: follow up CT in 6-12 months    LOS: 1 days A FACE TO FACE EVALUATION WAS PERFORMED  Luke Rivera 06/08/2019, 12:46 PM

## 2019-06-08 NOTE — Evaluation (Signed)
Physical Therapy Assessment and Plan  Patient Details  Name: Luke Rivera MRN: [id number] Date of Birth: 08/27/1941  PT Diagnosis: Abnormal posture, Abnormality of gait, Difficulty walking, Hemiparesis dominant, Impaired sensation, Muscle weakness and Pain in bilateral knees Rehab Potential: Good ELOS: 3-3.5 weeks   Today's Date: 06/08/2019 PT Individual Time: 1305-1401 PT Individual Time Calculation (min): 56 min    Problem List:  Patient Active Problem List   Diagnosis Date Noted  . Acute ischemic right MCA stroke (HCC) 06/07/2019  . AMS (altered mental status)   . Incidental pulmonary nodule, > 3mm and < 8mm 06/01/2019  . CVA (cerebral vascular accident) (HCC) 05/31/2019  . 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 Onychomycosis 01/11/2015  . Pain in lower limb 01/11/2015  . Hammer toe of right foot 01/02/2015  . Pain in right foot 01/02/2015    Past Medical History:  Past Medical History:  Diagnosis Date  . CAD (coronary artery disease)   . CHF (congestive heart failure) (HCC)   . Chronic anticoagulation   . DM2 (diabetes mellitus, type 2) (HCC)   . DVT (deep vein thrombosis) in pregnancy   . GERD (gastroesophageal reflux disease)   . HTN (hypertension)   . TIA (transient ischemic attack)    Past Surgical History: History reviewed. No pertinent surgical history.  Assessment & Plan Clinical Impression: Patient is a 78 y.o. year old male  with history of 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 MRI  brain revealed acute L-MCA infarct involving posterior frontal lobe and chronic left frontal infarct. 2D echo showed EF 55-60 % without wall abnormalities. TCD negative for PFO. Wife reported hx of arrhthymias in the past and telemetry revealed frequent PVCs with pauses therefore Dr. Varanasi consulted for input. He recommended holding BB and increasing home dose Eliquis to 5 mg bid--to monitor for bleeding side effects with plavix on board. He is to follow up with primary cards at [facility] after discharge. Patient transferred to CIR on 06/07/2019 .   Patient currently requires total with mobility secondary to muscle weakness, decreased cardiorespiratoy endurance, impaired timing and sequencing, abnormal tone, unbalanced muscle activation and decreased coordination, decreased midline orientation and decreased attention to right, decreased attention, decreased awareness and decreased safety awareness and decreased sitting balance, decreased standing balance, decreased postural control and decreased balance strategies.  Prior to hospitalization, patient was modified independent  with mobility and lived with Spouse(the following information was obtained via chart review due to pt's aphasia he is unable to provide subjective hx) in a House home.  Home access has  Stairs to enter.  Patient will benefit from skilled PT intervention to maximize safe functional mobility, minimize fall risk and decrease caregiver burden for planned discharge home with 24 hour assist.  Anticipate patient will benefit from follow up HH at discharge.  PT - End of Session Activity Tolerance: Tolerates 10 - 20 min activity with multiple rests Endurance Deficit: Yes Endurance Deficit Description: very poor activity tolerance requiring multiple rest breaks PT Assessment Rehab Potential (ACUTE/IP ONLY): Good PT Barriers to Discharge: Inaccessible home   environment;Decreased caregiver support;Home environment access/layout;Lack of/limited  family support PT Patient demonstrates impairments in the following area(s): Balance;Safety;Sensory;Endurance;Motor;Nutrition;Pain;Perception PT Transfers Functional Problem(s): Bed Mobility;Bed to Chair;Car;Furniture PT Locomotion Functional Problem(s): Ambulation;Wheelchair Mobility;Stairs PT Plan PT Intensity: Minimum of 1-2 x/day ,45 to 90 minutes PT Frequency: 5 out of 7 days PT Duration Estimated Length of Stay: 3-3.5 weeks PT Treatment/Interventions: Ambulation/gait training;Community reintegration;DME/adaptive equipment instruction;Neuromuscular re-education;Psychosocial support;Stair training;UE/LE Strength taining/ROM;Wheelchair propulsion/positioning;Balance/vestibular training;Functional electrical stimulation;Discharge planning;Pain management;Skin care/wound management;Therapeutic Activities;UE/LE Coordination activities;Cognitive remediation/compensation;Disease management/prevention;Functional mobility training;Patient/family education;Splinting/orthotics;Therapeutic Exercise;Visual/perceptual remediation/compensation PT Transfers Anticipated Outcome(s): min assist transfers PT Locomotion Anticipated Outcome(s): min assist ambulating short distances using LRAD PT Recommendation Recommendations for Other Services: Therapeutic Recreation consult Therapeutic Recreation Interventions: Pet therapy;Stress management Follow Up Recommendations: 24 hour supervision/assistance Patient destination: Home Equipment Recommended: To be determined  Skilled Therapeutic Intervention Evaluation completed (see details above and below) with education on PT POC and goals and individual treatment initiated with focus on bed<>chair transfers, sit<>stand transfers, and activity tolerance as well as education regarding daily therapy schedule, weekly team meetings, purpose of PT evaluation, and other CIR information. Pt received sitting in w/c and agreeable to therapy session. Pt with global aphasia  (expressive more impaired than receptive) - pt able to follow 1-2 step commands. Pt verbalized 1-2 word phrases during session such as "yes, I am" referring to being cold, "alright," and "bye bye;" however, pt often perseverated on the word "wow." Therapist educated pt on importance of moving R hemibody daily outside of therapy session as much as possible. Transported to/from therapy gym in w/c. Performed R lateral scoot transfer w/c>EOM using transfer board with max assist of 2 for lifting/pivoting hips and trunk control. Pt able to maintain static sitting balance on EOM with B UE support with supervision. Sit>stand EOM>stedy with max assist of 2 for lifting. Able to maintain high perched sitting in stedy with min assist for trunk control. Attempted sit<>stand in stedy to/from stedy seat with max assist of 1 but pt unable to achieve full upright posture with sustained B LE hip/knee flexion and R knee buckling with pt returning to sitting on stedy seat. Pt noted to demonstrate increased lethargy with drained look on his face - vitals assessed BP 136/76 (MAP 96), HR varying between 111bpm to 55bpm with SpO2 99%. Sit<>stand in stedy with return to sitting EOM with max assist of 2 for lifting/lowering and trunk control. L lateral scoot transfer EOM>w/c using transfer board with max assist of 2 for board placement, scooting hips, and trunk control. Transported back to room in w/c and PA notified of pt's increased fatigue and HR - PA assessed pt with no changes to therapy orders. L lateral scoot transfer w/c>EOB using transfer board with max assist of 1 for board placement and max assist of 2 for scooting hips and trunk control. Sit>supine with max assist of 2 for trunk descent and LB management. Pt left supine in bed with needs in reach and bed alarm on.  PT Evaluation Precautions/Restrictions Precautions Precautions: Fall Restrictions Weight Bearing Restrictions: No Pain Pain Assessment Pain Scale:  Faces Pain Score: 0-No pain Faces Pain Scale: Hurts little more Pain Type: Acute pain Pain Location: Leg(appeared to be located in knee but pt unable to verbalize due to aphasia) Pain Orientation: Right Pain Onset: With Activity(during PROM) Pain Intervention(s): Other (Comment);Rest;Repositioned(therapy to tolerance) Home Living/Prior Functioning Home Living Available Help at Discharge: Family;Available 24 hours/day Type of Home: House Home Access: Stairs to enter Entrance Stairs-Rails: None Home Layout: One level  Lives  With: Spouse(the following information was obtained via chart review due to pt's aphasia he is unable to provide subjective hx) Prior Function Level of Independence: Requires assistive device for independence;Independent with gait;Independent with transfers(per chart review limited mobility using walking stick mod-I) Vocation: Retired Comments: per chart review pt used "walking stick" but did not move around much; has residual memory impairment with problems processing information at a normal pace. Vision/Perception  Perception Perception: Impaired Inattention/Neglect: Other (comment)(decreased midline awareness with slight R hemibody inattention) Praxis Praxis: Impaired Praxis Impairment Details: Motor planning  Cognition  Overall Cognitive Status: Difficult to assess(due to aphasia) Arousal/Alertness: Awake/alert Orientation Level: Other (comment)(unable to answer questions due to aphasia (expressive more impaired than receptive)) Sensation Sensation Light Touch: Impaired by gross assessment(unable to formally assess/screen due to aphasia) Peripheral sensation comments: history of peripheral sensation loss in feet from DM Hot/Cold: Not tested Proprioception: Impaired by gross assessment Stereognosis: Not tested Coordination Gross Motor Movements are Fluid and Coordinated: No Coordination and Movement Description: R hemiplegia and slightly impaired R  attention Heel Shin Test: impaired R LE due to impaired strength Motor  Motor Motor: Hemiplegia;Abnormal tone Motor - Skilled Clinical Observations: R hemiparesis  Mobility Bed Mobility Bed Mobility: Sit to Supine Sit to Supine: 2 Helpers Transfers Transfers: Sit to Stand;Stand to Sit;Lateral/Scoot Transfers Sit to Stand: 2 Helpers Stand to Sit: 2 Helpers Lateral/Scoot Transfers: 2 Helpers;Dependent - mechanical lift Transfer (Assistive device): Other (Comment)(transfer board) Transfer via Lift Equipment: Stedy Locomotion  Gait Ambulation: No Gait Gait: No Stairs / Additional Locomotion Stairs: No Wheelchair Mobility Wheelchair Mobility: No  Trunk/Postural Assessment  Cervical Assessment Cervical Assessment: Within Functional Limits Thoracic Assessment Thoracic Assessment: Exceptions to WFL(rounded shoulders) Lumbar Assessment Lumbar Assessment: Exceptions to WFL(posterior pelvic tilt) Postural Control Postural Control: Deficits on evaluation Trunk Control: R lateral lean Righting Reactions: impaired Protective Responses: impaired Postural Limitations: decreased  Balance Balance Balance Assessed: Yes Static Sitting Balance Static Sitting - Balance Support: Feet supported;Bilateral upper extremity supported Static Sitting - Level of Assistance: 5: Stand by assistance Dynamic Sitting Balance Dynamic Sitting - Level of Assistance: 4: Min assist Static Standing Balance Static Standing - Level of Assistance: 1: +2 Total assist Extremity Assessment   RLE Assessment RLE Assessment: Exceptions to WFL RLE Strength Right Hip Flexion: 2-/5 Right Knee Flexion: 1/5 Right Knee Extension: 2-/5 Right Ankle Dorsiflexion: 2-/5 Right Ankle Plantar Flexion: 2-/5 LLE Assessment LLE Assessment: Exceptions to WFL LLE Strength Left Hip Flexion: 3+/5 Left Knee Flexion: 4-/5 Left Knee Extension: 4-/5 Left Ankle Dorsiflexion: 3-/5 Left Ankle Plantar Flexion: 3+/5    Refer to  Care Plan for Long Term Goals  Recommendations for other services: Therapeutic Recreation  Stress management  Discharge Criteria: Patient will be discharged from PT if patient refuses treatment 3 consecutive times without medical reason, if treatment goals not met, if there is a change in medical status, if patient makes no progress towards goals or if patient is discharged from hospital.  The above assessment, treatment plan, treatment alternatives and goals were discussed and mutually agreed upon: by patient  Carly M Pippin, PT, DPT 06/08/2019, 12:36 PM  

## 2019-06-08 NOTE — Progress Notes (Signed)
Social Work Assessment and Plan   Patient Details  Name: Luke Rivera MRN: [id number] Date of Birth: November 04, 1941  Today's Date: 06/08/2019  Problem List:  Patient Active Problem List   Diagnosis Date Noted  . Acute ischemic right MCA stroke ([place]) 06/07/2019  . AMS (altered mental status)   . Incidental pulmonary nodule, > 36mm and < 38mm 06/01/2019  . CVA (cerebral vascular accident) ([place]) 05/31/2019  . 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 Onychomycosis 01/11/2015  . Pain in lower limb 01/11/2015  . Hammer toe of right foot 01/02/2015  . Pain in right foot 01/02/2015   Past Medical History:  Past Medical History:  Diagnosis Date  . CAD (coronary artery disease)   . CHF (congestive heart failure) ([place])   . Chronic anticoagulation   . DM2 (diabetes mellitus, type 2) ([place])   . DVT (deep vein thrombosis) in pregnancy   . GERD (gastroesophageal reflux disease)   . HTN (hypertension)   . TIA (transient ischemic attack)    Past Surgical History: History reviewed. No pertinent surgical history. Social History:  reports that he has quit smoking. He has never used smokeless tobacco. He reports that he does not drink alcohol or use drugs.  Family / Support Systems Marital Status: Married How Long?: 15 years Patient Roles: Spouse, Parent Spouse/Significant Other: [name] [phone]  [phone] Children: Three daughter's not all in town and wife reports will not allow in home due to [place] Other Supports: Friends Anticipated Caregiver: Wife wants him to go to a NH from her if can't go home Ability/Limitations of Caregiver: Wife does not feel she can help him-being evaluated today Caregiver Availability: Other (Comment)(Wife looking at DC options) Family Dynamics: Close with daughter's only one is local. Wife very particular regarding who she will let in the home due to [place]. She is contacting places regarding resources for assist at DC. She has many questions  regarding recovery and prognosis  Social History Preferred language: English Religion:  Cultural Background: No issues Education: HS Read: Yes Write: Yes Employment Status: Retired Public relations account executive Issues: No issues Guardian/Conservator: None-according to MD pt is not fully capable of making his own decisions will look toward his wife for any decisions needing to be made while here. No formal POA in place according to wife   Abuse/Neglect Abuse/Neglect Assessment Can Be Completed: Yes Physical Abuse: Denies Verbal Abuse: Denies Sexual Abuse: Denies Exploitation of patient/patient's resources: Denies Self-Neglect: Denies  Emotional Status Pt's affect, behavior and adjustment status: Pt can only get a few words out but other's are garbled. Obtained information for assessment. He was mod/i and could do for himself prior to admission even though had two other CVA's. He was limited with his ambulation due to multiple back surgeries and leg issues from past DVT's. Recent Psychosocial Issues: other health issues were managed by PCP Psychiatric History: No history deferred depression screen due to not able to communicate due to speech issues from stroke. Will see if becomes appropriate while here and will have neuro-psych see while here Substance Abuse History: No issues  Patient / Family Perceptions, Expectations & Goals Pt/Family understanding of illness & functional limitations: Wife can explain he had a stroke and his deficits. She is seeking information on prognosis and his recovery from this event. Will ask PA or MD to call her to answer her questions and give her medical information she has not received from acute. Premorbid pt/family roles/activities: husband, father, retiree, veteran, etc  Anticipated changes in roles/activities/participation: resume Pt/family expectations/goals: Wife states: " I want to know what I am in for? "  " I am 78 years old I can't do 24 hour care,  he is a big man."  Pt states: " I try."  [employer]: None Premorbid Home Care/DME Agencies: Other (Comment)(walking stick) Transportation available at discharge: Wife Resource referrals recommended: Neuropsychology, Support group (specify)  Discharge Planning Living Arrangements: Spouse/significant other Support Systems: Spouse/significant other, Children, Friends/neighbors, Church/faith community Type of Residence: Private residence Insurance Resources: Multimedia programmer (specify)(A[employer]na Medicare) Financial Resources: Social Security Financial Screen Referred: No Living Expenses: Own Money Management: Spouse, Patient Does the patient have any problems obtaining your medications?: No Home Management: Wife Patient/Family Preliminary Plans: Wife feels if he requires care she will not be able to provide this due to her age and how big pt is. She has been contacting the [place] and SNF's to see if they can assist with his care. She wants to know his prognosis and what he can do and wants him here four weeks to see what progress he can make. [place] Barriers to Discharge: Decreased caregiver support, Insurance for SNF coverage Sw Barriers to Discharge Comments: Wife feels limited in what she can assist pt with and was made aware their insurance may not assist with SNF at DC from [facility] Work Anticipated Follow Up Needs: HH/OP, SNF, Support Group  Clinical Impression Obtained information from wife due to pt's speech deficits. She appears very over whelmed due to not feeling she can assist him due to his size and her age of 78 yo. She reports she will not let the children in due to exposing them to [place]. She has been contacting [place] and facilities to see if that would be an option for him at DC. She is requesting four week length of stay for him to make progress here. Will continue to follow and work on best option for pt. Will ask MD to contact wife regarding her  questions/concerns.  Elease Hashimoto 06/08/2019, 1:27 PM

## 2019-06-08 NOTE — Progress Notes (Signed)
Patient information reviewed and entered into eRehab System by Becky Bonney Berres, PPS coordinator. Information including medical coding, function ability, and quality indicators will be reviewed and updated through discharge.   

## 2019-06-09 ENCOUNTER — Inpatient Hospital Stay (HOSPITAL_COMMUNITY): Payer: Medicare HMO

## 2019-06-09 ENCOUNTER — Inpatient Hospital Stay (HOSPITAL_COMMUNITY): Payer: Medicare HMO | Admitting: Occupational Therapy

## 2019-06-09 ENCOUNTER — Inpatient Hospital Stay (HOSPITAL_COMMUNITY): Payer: Medicare HMO | Admitting: Speech Pathology

## 2019-06-09 DIAGNOSIS — G8191 Hemiplegia, unspecified affecting right dominant side: Secondary | ICD-10-CM

## 2019-06-09 LAB — GLUCOSE, CAPILLARY
Glucose-Capillary: 205 mg/dL — ABNORMAL HIGH (ref 70–99)
Glucose-Capillary: 247 mg/dL — ABNORMAL HIGH (ref 70–99)
Glucose-Capillary: 206 mg/dL — ABNORMAL HIGH (ref 70–99)
Glucose-Capillary: 190 mg/dL — ABNORMAL HIGH (ref 70–99)

## 2019-06-09 MED ORDER — INSULIN GLARGINE 100 UNIT/ML ~~LOC~~ SOLN
20.0000 [IU] | Freq: Every day | SUBCUTANEOUS | Status: DC
  Administered 2019-06-09 – 2019-06-18 (×10): 20 [IU] via SUBCUTANEOUS
  Filled 2019-06-09 (×11): qty 0.2

## 2019-06-09 MED ORDER — ADULT MULTIVITAMIN W/MINERALS CH: 1.0000 | ORAL_TABLET | Freq: Every day | ORAL | Status: DC

## 2019-06-09 MED ORDER — GLUCERNA SHAKE PO LIQD
237.0000 mL | Freq: Three times a day (TID) | ORAL | Status: DC
  Administered 2019-06-09 – 2019-06-15 (×16): 237 mL via ORAL

## 2019-06-09 MED ORDER — ADULT MULTIVITAMIN LIQUID CH
15.0000 mL | Freq: Every day | ORAL | Status: DC
  Administered 2019-06-10 – 2019-06-28 (×19): 15 mL via ORAL
  Filled 2019-06-09 (×21): qty 15

## 2019-06-09 NOTE — Patient Care Conference (Signed)
Inpatient RehabilitationTeam Conference and Plan of Care Update Date: 06/09/2019   Time: 11:10 AM    Patient Name: Luke Rivera      Medical Record Number: [id number]  Date of Birth: 20-Aug-1941 Sex: Male         Room/Bed: 4W13C/4W13C-01 Payor Info: Payor: A[employer] MEDICARE / Plan: A[employer] MEDICARE HMO/PPO / Product Type: *No Product type* /    Admitting Diagnosis: 4. CVA 2 Team L CVA  Admit Date/Time:  06/07/2019  3:16 PM Admission Comments: No comment available   Primary Diagnosis:  <principal problem not specified> Principal Problem: <principal problem not specified>  Patient Active Problem List   Diagnosis Date Noted  . Acute ischemic right MCA stroke ([place]) 06/07/2019  . AMS (altered mental status)   . Incidental pulmonary nodule, > 83mm and < 36mm 06/01/2019  . CVA (cerebral vascular accident) ([place]) 05/31/2019  . 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 Onychomycosis 01/11/2015  . Pain in lower limb 01/11/2015  . Hammer toe of right foot 01/02/2015  . Pain in right foot 01/02/2015    Expected Discharge Date: Expected Discharge Date: 07/02/19  Team Members Present: Physician leading conference: Dr. Alysia Penna Social Worker Present: Ovidio Kin, LCSW Nurse Present: Other (comment)(Jeanna Hicks-RN) PT Present: Michaelene Song, PT OT Present: Clyda Greener, OT SLP Present: Stormy Fabian, SLP PPS Coordinator present : Gunnar Fusi, SLP     Current Status/Progress Goal Weekly Team Focus  Medical   A phasic, does have antigravity minus strength on the right side.  Maintain medical stability  Continue rehabilitation program   Bowel/Bladder   incontinent of b/b; LBM: 06/28  maintain regular bowel pattern; regain continences of b/b  time tolieting; assist with tolieting needs prn   Swallow/Nutrition/ Hydration   dysphagia 3 with thin liquids  Supervision  completion of evaluation   ADL's   Max to total A overall with mobility,  min sit balance, min UB  bathing, mod UB dressing, Max LB Bathing and total LB dressing and toileting  Min A overall  ADL training, balance, RUE NMR,  perceptual awareness. pt/fam ed.   Mobility   +2 max assist bed mobility, +2 max assist sit<>stand and bed<>chair transfer via slide board  supervision bed mobility, min assist bed<>chair transfers, min assist gait 2ft with LRAD, supervision w/c mobility  sit<>stand and bed<>chair transfers, bed mobility, balance, and pt education   Communication   expressive and receptive Mixed aphasia  Min A  word finding and comprehension strategies   Safety/Cognition/ Behavioral Observations            Pain   no c/o pain  remain pain free  assess pain level QS and prn   Skin   ecchymosis on bil LE  remain free of new skin breakdown/infection  assess skin QS and prn      *See Care Plan and progress notes for long and short-term goals.     Barriers to Discharge  Current Status/Progress Possible Resolutions Date Resolved   Physician    Medical stability     Progressing towards goals no medical setbacks  Continue rehabilitation program      Nursing                  PT  Inaccessible home environment;Decreased caregiver support;Home environment access/layout;Lack of/limited family support                 OT  SLP                SW Decreased caregiver support;Insurance for SNF coverage Wife feels limited in what she can assist pt with and was made aware their insurance may not assist with SNF at DC from [facility]            Discharge Planning/Teaching Needs:  Wife concerned about being able to provide assist, she feels with her age and pt's care needs he may need to go to a SNF upon DC from rehab.      Team Discussion:  Goals min assist overall-currently total plus 2 assist. Adjusting DM-blood sugars. Getting movement on his right side. Dys 2 thin currently on. Nursing working on continence. Wife reports incontinent of bowels prior to admission. Wife wants to  take home if she can manage at home at min assist level.  Revisions to Treatment Plan:  DC 7/24    Continued Need for Acute Rehabilitation Level of Care: The patient requires daily medical management by a physician with specialized training in physical medicine and rehabilitation for the following conditions: Daily direction of a multidisciplinary physical rehabilitation program to ensure safe treatment while eliciting the highest outcome that is of practical value to the patient.: Yes Daily medical management of patient stability for increased activity during participation in an intensive rehabilitation regime.: Yes Daily analysis of laboratory values and/or radiology reports with any subsequent need for medication adjustment of medical intervention for : Neurological problems   I attest that I was present, lead the team conference, and concur with the assessment and plan of the team. Teleconference held due to COVID 19   Nilza Eaker, Gardiner Rhyme 06/09/2019, 12:48 PM

## 2019-06-09 NOTE — Progress Notes (Signed)
Initial Nutrition Assessment  RD working remotely.  DOCUMENTATION CODES:   Obesity unspecified  INTERVENTION:   - Glucerna Shake po TID, each supplement provides 220 kcal and 10 grams of protein  - Magic Cup BID with lunch and dinner meals, each supplement provides 290 kcal and 9 grams of protein  - MVI with minerals daily  - Encourage adequate PO intake and provide feeding assistance as needed  - d/c Ensure Enlive  NUTRITION DIAGNOSIS:   Inadequate oral intake related to dysphagia as evidenced by meal completion < 25%.  GOAL:   Patient will meet greater than or equal to 90% of their needs  MONITOR:   PO intake, Supplement acceptance, Diet advancement, Weight trends, Labs  REASON FOR ASSESSMENT:   Consult Poor PO  ASSESSMENT:   78 year old male with PMH of CAD/CAF, DVT, TIA, multiple back surgeries with limited mobility, STM deficits, and T2DM with peripheral neuropathy. Pt was admitted on 06/22 with garbled speech and right-sided weakness. 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 2D echo showed EF 55-60 % without wall abnormalities. Pt admitted to CIR on 6/29.  6/30 - diet advanced from Dysphagia 2 to Dysphagia 3  RD consulted for poor PO intake. Unable to speak with pt at this time due to severe aphasia.  Reviewed weight history in chart. Weight of 122 kg recorded on 05/31/19. If accurate, pt has experienced a 5.8 kg weight loss in less than 2 weeks. This is a 4.8% weight loss which is significant for timeframe. RD will monitor weight trends during admission to CIR.  Reviewed RN edema assessment. Pt with non-pitting edema to BLE.  RD  will switch Ensure Enlive to Glucerna Shakes given persistently elevated CBG's. Will also add Magic Cup BID with lunch and dinner meals and order a daily MVI.  Given poor PO intake and significant weight loss, pt is at risk for acute malnutrition.  Meal Completion: 0-20% x 4 meals  Medications reviewed and include: Ensure Enlive TID, SSI, Lantus 20 units daily, Metformin 500 mg BID, Protonix  Labs reviewed. CBG's: 205-255 x 24 hours  NUTRITION - FOCUSED PHYSICAL EXAM:  Unable to complete at this time. RD working remotely.  Diet Order:   Diet Order            DIET DYS 3 Room service appropriate? Yes; Fluid consistency: Thin  Diet effective now              EDUCATION NEEDS:   Not appropriate for education at this time  Skin:  Skin Assessment: Reviewed RN Assessment  Last BM:  06/06/19  Height:   Ht Readings from Last 1 Encounters:  06/07/19 6' (1.829 m)    Weight:   Wt Readings from Last 1 Encounters:  06/09/19 116.2 kg    Ideal Body Weight:  80.9 kg  BMI:  Body mass index is 34.75 kg/m.  Estimated Nutritional Needs:   Kcal:  1900-2100  Protein:  95-110 grams  Fluid:  >/= 1.9 L    Gaynell Face, MS, RD, LDN Inpatient Clinical Dietitian Pager: [phone] Weekend/After Hours: [phone]

## 2019-06-09 NOTE — Progress Notes (Signed)
Social Work Patient ID: Luke Rivera, male   DOB: [birth year], 78 y.o.   MRN: [id number]  [place] with wife via telephone to discuss team conference goals of min assist level and target discharge date of 7/24. Wife reports: " That means he will need 24 hour physical care when he comes home." Education officer, museum confirmed this. She wants to take him home but wants to make sure she can provide the care he will need. MD has contacted her and addressed her questions and concerns. She is aware by next week the team will have a better knowledge of his levels and progress, since it has only been three days since he came to rehab. She is calling and finding resources she can use at discharge and is keeping his daughter's updated on his condition. Will continue to work on discharge needs and provide support to wife.

## 2019-06-09 NOTE — Progress Notes (Signed)
Occupational Therapy Session Note  Patient Details  Name: Luke Rivera MRN: [id number] Date of Birth: 05/23/1941  Today's Date: 06/09/2019 OT Individual Time: 8502-7741 OT Individual Time Calculation (min): 48 min    Short Term Goals: Week 1:  OT Short Term Goal 1 (Week 1): Pt will be able to sit to stand with mod A of 1 to prepare for toileting tasks. OT Short Term Goal 2 (Week 1): Pt will be able to transfer to [facility] and/ or toilet with mod A of 1. OT Short Term Goal 3 (Week 1): Pt will be able to don shirt with min A. OT Short Term Goal 4 (Week 1): Pt will use RUE with mod A to bathe L arm. OT Short Term Goal 5 (Week 1): Pt will don pants with max A.  Skilled Therapeutic Interventions/Progress Updates:    Pt completed bathing and dressing sit to stand from the EOB.  Max assist for supine to sit EOB with min assist overall for dynamic sitting balance.  Mod instructional cueing with mod assist for UB bathing and dressing.  Max hand over hand for washing the left shoulder with the right arm.  Increased flexor tone in the right elbow and internal rotators were noted.  Total +2 (pt 30%) for LB bathing and dressing sit to stand.  Finished session with pt returning to bed and positioned with HOB elevated.  Increased difficulty with verbal expression throughout session noted secondary to aphasia.  Call button and phone in reach with safety alarm on as well.   Therapy Documentation Precautions:  Precautions Precautions: Fall Restrictions Weight Bearing Restrictions: No  Pain: Pain Assessment Pain Scale: Faces Pain Score: 0-No pain ADL: See Care Tool Section for some details of ADL  Therapy/Group: Individual Therapy  Keishawn Rajewski OTR/L 06/09/2019, 4:00 PM

## 2019-06-09 NOTE — Progress Notes (Signed)
[place] PHYSICAL MEDICINE & REHABILITATION PROGRESS NOTE   Subjective/Complaints:  Remains severely aphasic.  Does open his eyes to his name being called. Yes no with questionable accuracy ROS: limited due to language/communication    Objective:   No results found. Recent Labs    06/08/19 0611  WBC 8.6  HGB 12.8*  HCT 37.9*  PLT 290   Recent Labs    06/08/19 0611  NA 135  K 3.6  CL 101  CO2 23  GLUCOSE 220*  BUN 19  CREATININE 0.84  CALCIUM 8.8*    Intake/Output Summary (Last 24 hours) at 06/09/2019 0711 Last data filed at 06/08/2019 1825 Gross per 24 hour  Intake 458 ml  Output -  Net 458 ml     Physical Exam: Vital Signs Blood pressure 116/72, pulse 74, temperature 99.2 F (37.3 C), temperature source Oral, resp. rate 14, height 6' (1.829 m), weight 116.2 kg, SpO2 92 %.  Constitutional: No distress . Vital signs reviewed. obese HEENT: EOMI, oral membranes moist Neck: supple Cardiovascular: RRR without murmur. No JVD    Respiratory: CTA Bilaterally without wheezes or rales. Normal effort    GI: BS +, non-tender, non-distended   Musculoskeletal:  General: No deformityor edema.  Neurological: awoke easily. Garbled speech, expressive language deficits. Followed basic commands.  RUE 1/5 prox to 1+/5 wrist, hand. Marland Kitchen RLE 2-/5 prox to 1-2/5 distally with inconsistent engagement, apraxia. LUE and LLE 5/5. Senses pain right arm and leg. Skin: Skin iswarm. He isnot diaphoretic. Scattered abrasions right arm and leg remain Psychiatric: remains a little distracted   Assessment/Plan: 1. 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  Physiatrist is providing close team supervision and 24 hour management of active medical problems listed below.  Physiatrist and rehab team continue to assess barriers to discharge/monitor patient progress toward  functional and medical goals  Care Tool:  Bathing    Body parts bathed by patient: Chest, Abdomen, Front perineal area, Right arm, Right upper leg, Left upper leg, Face   Body parts bathed by helper: Buttocks, Left arm, Right lower leg, Left lower leg     Bathing assist Assist Level: Moderate Assistance - Patient 50 - 74%     Upper Body Dressing/Undressing Upper body dressing   What is the patient wearing?: Pull over shirt    Upper body assist Assist Level: Moderate Assistance - Patient 50 - 74%    Lower Body Dressing/Undressing Lower body dressing      What is the patient wearing?: Incontinence brief     Lower body assist Assist for lower body dressing: Total Assistance - Patient < 25%     Toileting Toileting    Toileting assist Assist for toileting: 2 Helpers     Transfers Chair/bed transfer  Transfers assist     Chair/bed transfer assist level: 2 Helpers     Locomotion Ambulation   Ambulation assist   Ambulation activity did not occur: Safety/medical concerns          Walk 10 feet activity   Assist  Walk 10 feet activity did not occur: Safety/medical concerns        Walk 50 feet activity   Assist Walk 50 feet with 2 turns activity did not occur: Safety/medical concerns         Walk 150 feet activity   Assist Walk 150 feet activity did not occur: Safety/medical concerns  Walk 10 feet on uneven surface  activity   Assist Walk 10 feet on uneven surfaces activity did not occur: Safety/medical concerns         Wheelchair     Assist Will patient use wheelchair at discharge?: (TBD)             Wheelchair 50 feet with 2 turns activity    Assist            Wheelchair 150 feet activity     Assist          Medical Problem List and Plan: 1.Functional and cognitive deficitssecondary to left MCA infarct Team conference today please see physician documentation under team  conference tab, met with team face-to-face to discuss problems,progress, and goals. Formulized individual treatment plan based on medical history, underlying problem and comorbidities. 2. Antithrombotics: -DVT/anticoagulation:Pharmaceutical:Other (comment)--Eliquis -antiplatelet therapy: Plavix 3. Pain Management:tylenol prn 4. Mood:LCSW to follow for evaluation and support. -antipsychotic agents: N/A 5. Neuropsych: This patientis notcapable of making decisions on hisown behalf. 6. Skin/Wound Care:Routine pressure relief measures 7. Fluids/Electrolytes/Nutrition:intake poor over the last 48+ hours  -I personally reviewed the patient's labs today.    -potassium now normal 3.6  -encourage PO, needs supervision  -will ask RD for recs to help boost nutritional status  -may need supplemental nutrition  -check daily weights 8. CAD: On Lipitor and Plavix --no BB due to intermittent pauses.  9. T2DM with neuropathy: Monitor BS ac/hs. Was on metformin, Glipizide and Lantus at home--currently Lantus 10 units at bedtime.   -cbgs uncontrolled despite poor po intake  -resumed metformin bid for meal coverage.   -increase lantus to 20u hs CBG (last 3)  Recent Labs    06/08/19 1632 06/08/19 2114 06/09/19 0610  GLUCAP 229* 232* 205*  Uncontrolled 7/ 1 10.Hypoxia: Has been weaned off oxygen. Continue to monitor for recurrent fevers. 11. LUL pulmonary nodule: follow up CT in 6-12 months    LOS: 2 days A FACE TO FACE EVALUATION WAS PERFORMED  Charlett Blake 06/09/2019, 7:11 AM

## 2019-06-09 NOTE — Progress Notes (Signed)
Physical Therapy Session Note  Patient Details  Name: Luke Rivera MRN: [id number] Date of Birth: 04/01/1941  Today's Date: 06/09/2019 PT Individual Time: 1120-1230 PT Individual Time Calculation (min): 70 min   Short Term Goals: Week 1:  PT Short Term Goal 1 (Week 1): Pt will perform supine<>sit with max assist of 1 PT Short Term Goal 2 (Week 1): Pt will perform bed<>chair transfers with max assist of 1 PT Short Term Goal 3 (Week 1): Pt will perform sit<>stand with max assist of 1 using LRAD  Skilled Therapeutic Interventions/Progress Updates:     Patient in w/c upon PT arrival. Patient alert and agreeable to PT session.  Therapeutic Activity: Bed Mobility: Patient performed sit to supine with mod A for trunk and LE managment.  Transfers: Patient performed sit to/from stand x1 from the w/c using the Stedy  With max A +2 and x3 from the [name] with min-mod A of 1. Provided verbal cues for leaning forward to stand, pushing through B LEs, quad activation, and reaching back to sit.  Neuromuscular Re-ed: Patient performed static standing balance in Stedy x3 for 1-2 min each trial. He performed static and dynamic sitting balance reading words on his communication sheet in room, stating names of family in pictures, and reaching with L UE for PTs hand in various directions within BOS with CGA-min A for sitting balance. Requires increased time and trials to find correct words, but demonstrated good enunciation of several names and words throughout session. Patient became emotional when trying to find words and when looking at pictures of family. PT comforted patient, explained the type of stroke he had and area of the brain affected and current progress during session with word finding. Patient calm and no longer tearful after.   Patient in bed at end of session with breaks locked, bed alarm set, and all needs within reach.    Therapy Documentation Precautions:  Precautions Precautions:  Fall Restrictions Weight Bearing Restrictions: No Pain: Patient denied pain using communication sheet.    Therapy/Group: Individual Therapy  [name] L Teo Moede PT, DPT  06/09/2019, 3:51 PM

## 2019-06-09 NOTE — Progress Notes (Signed)
Physical Therapy Session Note  Patient Details  Name: Luke Rivera MRN: [id number] Date of Birth: July 08, 1941  Today's Date: 06/09/2019 PT Individual Time: 0915-1015 PT Individual Time Calculation (min): 60 min   Short Term Goals: Week 1:  PT Short Term Goal 1 (Week 1): Pt will perform supine<>sit with max assist of 1 PT Short Term Goal 2 (Week 1): Pt will perform bed<>chair transfers with max assist of 1 PT Short Term Goal 3 (Week 1): Pt will perform sit<>stand with max assist of 1 using LRAD  Skilled Therapeutic Interventions/Progress Updates:    Pt supine in bed upon PT arrival, agreeable to therapy tx and denies pain. Pt noted to be incontient of bladder. Pt performed rolling in both directions x 2 this session with mod assist in order to change briefs and don pants, pt performed paricare with set up assist. Pt transferred to sitting EOB with mod assist and performed slideboard transfer to the w/c with mod assist. Pt performed w/c mobility x50 ft this session using L UE/L LE with mod assist for L LE use and cues for techniques. Pt worked on standing at the rail this session, performed x 2 sit<>stands from w/c using L rail with max assist, in standing worked on trying to increase hip/trunk extension and upright posture, mirror for visual feedback and therapist blocking R knee. Pt performed slideboard transfer to w/c<> mat this session with mod assist and cues for techniques. Pt transferred to supine reclined on blue wedge with LEs hanging off the mat for hip flexor stretching this session 2 x 1 min per LE, also for quad stretching 2 x 30 sec per LE. Pt transferred back to sitting with mod assist. Pt performed x 2 sit<>stands with max assist +2 using 3 musketeers techniques, manual facilitation for increased hip/trunk extension, worked on pre-gait in this position to perform x 2 steps in place with L LE and x 3 steps in place with R LE. Pt transferred back to the room and left seated in w/c with needs  in reach and chair alarm set.   Therapy Documentation Precautions:  Precautions Precautions: Fall Restrictions Weight Bearing Restrictions: No    Therapy/Group: Individual Therapy  Netta Corrigan, PT, DPT 06/09/2019, 8:00 AM

## 2019-06-09 NOTE — Progress Notes (Signed)
Speech Language Pathology Daily Session Note  Patient Details  Name: Luke Rivera MRN: [id number] Date of Birth: 1941-03-31  Today's Date: 06/09/2019 SLP Individual Time: 1330-1400 SLP Individual Time Calculation (min): 30 min  Short Term Goals: Week 1: SLP Short Term Goal 1 (Week 1): Pt will construct noun + verb utterances when describing pictures with 80% accuracy and multimodal cues. SLP Short Term Goal 2 (Week 1): Pt will respond to questions with a clear/intelligible YES/NO on 8/10 opportunities. SLP Short Term Goal 3 (Week 1): Pt will listen to short, simple paragraphs and respond to yes/no questions with 80% accuracy. SLP Short Term Goal 4 (Week 1): Pt will attend to right side of mouth and clear oral residue with lingual sweep on 5/7 opportunities independently.  Skilled Therapeutic Interventions:  Skilled treatment session focused on dysphagia and communication goals. SLP facilitated session by providing skilled observation of pt consuming regular diet textures with decreased right pocketing noted. Pt with mild anterior spillage but no accumulating residue. Suggest another trial prior to upgrading diet. SLP also facilitated session by providing simple basic picture and Max A cues to produce noun and verb description. Pt's speech intelligibility also decreases ability to determine if he produces appropriate language. Speech is c/b rapid and imprecise articulation that lacks intonation/prosody. Pt left in wheelchair, lap belt alarm on and all needs within reach. Continue per current plan of care.      Pain Pain Assessment Pain Scale: Faces Pain Score: 0-No pain  Therapy/Group: Individual Therapy  Deidrea Gaetz 06/09/2019, 4:26 PM

## 2019-06-10 ENCOUNTER — Inpatient Hospital Stay (HOSPITAL_COMMUNITY): Payer: Medicare HMO

## 2019-06-10 ENCOUNTER — Inpatient Hospital Stay (HOSPITAL_COMMUNITY): Payer: Medicare HMO | Admitting: Speech Pathology

## 2019-06-10 ENCOUNTER — Inpatient Hospital Stay (HOSPITAL_COMMUNITY): Payer: Medicare HMO | Admitting: Occupational Therapy

## 2019-06-10 ENCOUNTER — Inpatient Hospital Stay (HOSPITAL_COMMUNITY): Payer: Medicare HMO | Admitting: Physical Therapy

## 2019-06-10 LAB — GLUCOSE, CAPILLARY
Glucose-Capillary: 174 mg/dL — ABNORMAL HIGH (ref 70–99)
Glucose-Capillary: 234 mg/dL — ABNORMAL HIGH (ref 70–99)
Glucose-Capillary: 212 mg/dL — ABNORMAL HIGH (ref 70–99)
Glucose-Capillary: 197 mg/dL — ABNORMAL HIGH (ref 70–99)

## 2019-06-10 MED ORDER — DICLOFENAC SODIUM 1 % TD GEL
2.0000 g | Freq: Four times a day (QID) | TRANSDERMAL | Status: DC
  Administered 2019-06-10 – 2019-07-02 (×68): 2 g via TOPICAL
  Filled 2019-06-10 (×2): qty 100

## 2019-06-10 NOTE — Progress Notes (Signed)
Occupational Therapy Session Note  Patient Details  Name: Luke Rivera MRN: [id number] Date of Birth: 12/04/1941  Today's Date: 06/10/2019 OT Individual Time: 1062-6948 OT Individual Time Calculation (min): 35 min    Short Term Goals: Week 1:  OT Short Term Goal 1 (Week 1): Pt will be able to sit to stand with mod A of 1 to prepare for toileting tasks. OT Short Term Goal 2 (Week 1): Pt will be able to transfer to [facility] and/ or toilet with mod A of 1. OT Short Term Goal 3 (Week 1): Pt will be able to don shirt with min A. OT Short Term Goal 4 (Week 1): Pt will use RUE with mod A to bathe L arm. OT Short Term Goal 5 (Week 1): Pt will don pants with max A.  Skilled Therapeutic Interventions/Progress Updates:    Patient in bed, pleasant, able to follow verbal directions, significant difficulty with expression.  Able to roll left/right with cues, max A to change soiled brief, completed hygiene and LB dressing in supine position.  He is able to assist with repositioning right LE.  Supine to SSP with mod A.  Sit pivot transfer bed to w/c with max A of one and stand by of 2nd person.  Patient completed UB washing with min A, oral care with supervision/cues and UB dressing with mod A seated in w/c at sink.  Sit to stand max A of 2 - able to maintain stance for approx one minute with CGA A and cues for upright posture.  He remained in w/c at close of session set up with breakfast tray and with seat belt alarm set and tray table /call bell in reach.    Therapy Documentation Precautions:  Precautions Precautions: Fall Restrictions Weight Bearing Restrictions: No General:   Vital Signs:   Pain: Pain Assessment Pain Scale: Faces Faces Pain Scale: No hurt   Other Treatments:     Therapy/Group: Individual Therapy  Carlos Levering 06/10/2019, 11:39 AM

## 2019-06-10 NOTE — Progress Notes (Signed)
Physical Therapy Session Note  Patient Details  Name: Luke Rivera MRN: [id number] Date of Birth: 1941/07/19  Today's Date: 06/10/2019 PT Individual Time: 1006-1109 PT Individual Time Calculation (min): 63 min   Short Term Goals: Week 1:  PT Short Term Goal 1 (Week 1): Pt will perform supine<>sit with max assist of 1 PT Short Term Goal 2 (Week 1): Pt will perform bed<>chair transfers with max assist of 1 PT Short Term Goal 3 (Week 1): Pt will perform sit<>stand with max assist of 1 using LRAD  Skilled Therapeutic Interventions/Progress Updates:   Pt received sitting in w/c and agreeable to therapy session - continues to be aphasic but able to get out short phrases such as "6 foot 3" when talking about his height, "nothing much," "no, not yet," and "I can't remember..." when attempting to respond to other questions. Transported to/from gym in w/c. R lateral scoot transfer w/c>EOM using transfer board with total assist for board placement and max assist of 1 for lifting/scooting hips. Sit>supine with mod assist for trunk descent and B LE management - cuing for sequencing. Pt began groaning/moaning lying in supine demonstrating some pain/discomfort but unable to explain further due to aphasia; however, demonstrates higher pain levels when providing manual facilitation of R knee movement - RN notified of this. Attempted 2x5 supine bridging with pt able to place LEs in position then providing cuing/manual facilitation for increased R glute activation during movement - pt unable to tolerate manual facilitation for increased R LE weightbearing due to knee pain - pt unable to perform more than 5 repetitions due to pt demonstrating increased discomfort/pain. Supine>sit with max assist for B LE management and trunk upright. Sit<>stand elevated EOM<>3 Musketeer x3 repetitions with mod assist of 2 for lifting - manual facilitation for R LE alignment and tactile cuing for increased R quad activation. While standing  with 3 Musketeer support and mirror feedback for improved upright posture attempted L LE swing phase to focus on R LE stance phase but pt unable to weight shift R to pick up L foot despite max multimodal cuing and manual facilitation. Performed R LE swing phase 2x3 repetitions with cuing for increased hip/knee flexion - pt able to move a limited ROM without assist and required intermittent mod assist for returning foot to starting position. Pt attempting to communicate with therapist to report need to have BM. L lateral scoot transfer EOM>w/c down hill transfer using transfer board with total assist for board placement and mod assist of 2 for scooting. Transported back to room in w/c. Sit<>stand using stedy with mod assist of 2 for lifting. Stedy transfer w/c>BSC. Standing in stedy with B UE support and max assist of 1 person to perform LB clothing management with min assist of 2nd person for balance. Pt continent of BM. Standing in stedy with min assist of 1 person for balance and total assist of 2nd person for peri-care and LB clothing management. Stedy transfer to EOB. Sit>supine with mod assist of 2 for controlled trunk descent and B LE management. Pt attempting to communicate not feeling well - RN notified. Pt left supine in bed with needs in reach and bed alarm on.  Therapy Documentation Precautions:  Precautions Precautions: Fall Restrictions Weight Bearing Restrictions: No  Pain:   Details above - difficult to assessed due to aphasia but demonstrates B LE knee pain (R>L).    Therapy/Group: Individual Therapy  Tawana Scale, PT, DPT 06/10/2019, 7:56 AM

## 2019-06-10 NOTE — IPOC Note (Signed)
Overall Plan of Care [facility]) Patient Details Name: Luke Rivera MRN: [id number] DOB: 23-Aug-1941  Admitting Diagnosis: <principal problem not specified>  Hospital Problems: Active Problems:   Acute ischemic right MCA stroke [facility])     Functional Problem List: Nursing Bladder, Bowel, Edema, Endurance, Medication Management, Motor, Nutrition, Safety  PT Balance, Safety, Sensory, Endurance, Motor, Nutrition, Pain, Perception  OT Balance, Cognition, Endurance, Motor, Perception, Safety, Sensory  SLP    TR         Basic ADL's: OT Grooming, Bathing, Dressing, Toileting     Advanced  ADL's: OT       Transfers: PT Bed Mobility, Bed to Chair, Car, Manufacturing systems engineer, Metallurgist: PT Ambulation, Emergency planning/management officer, Stairs     Additional Impairments: OT Fuctional Use of Upper Extremity  SLP Swallowing      TR      Anticipated Outcomes Item Anticipated Outcome  Self Feeding Independent  Swallowing  Supervision   Basic self-care  Min A overall  Toileting  Min A overall   Bathroom Transfers Min A overall  Bowel/Bladder  manage bowel with mod I and bladder with min assist  Transfers  min assist transfers  Locomotion  min assist ambulating short distances using LRAD  Communication  Min A  Cognition     Pain  n/a  Safety/Judgment  mantain safety with cues and reminders   Therapy Plan: PT Intensity: Minimum of 1-2 x/day ,45 to 90 minutes PT Frequency: 5 out of 7 days PT Duration Estimated Length of Stay: 3-3.5 weeks OT Intensity: Minimum of 1-2 x/day, 45 to 90 minutes OT Frequency: 5 out of 7 days OT Duration/Estimated Length of Stay: 21-23 days SLP Intensity: Minumum of 1-2 x/day, 30 to 90 minutes SLP Frequency: 3 to 5 out of 7 days SLP Duration/Estimated Length of Stay: 3 weeks   Due to the current state of emergency, patients may not be receiving their 3-hours of Medicare-mandated therapy.   Team Interventions: Nursing Interventions  Patient/Family Education, Bladder Management, Bowel Management, Disease Management/Prevention, Medication Management, Discharge Planning, Dysphagia/Aspiration Precaution Training  PT interventions Ambulation/gait training, Community reintegration, DME/adaptive equipment instruction, Neuromuscular re-education, Psychosocial support, Stair training, UE/LE Strength taining/ROM, Wheelchair propulsion/positioning, Training and development officer, Functional electrical stimulation, Discharge planning, Pain management, Skin care/wound management, Therapeutic Activities, UE/LE Coordination activities, Cognitive remediation/compensation, Disease management/prevention, Functional mobility training, Patient/family education, Splinting/orthotics, Therapeutic Exercise, Visual/perceptual remediation/compensation  OT Interventions Balance/vestibular training, Cognitive remediation/compensation, Discharge planning, DME/adaptive equipment instruction, Disease mangement/prevention, Functional mobility training, Pain management, Neuromuscular re-education, Patient/family education, Psychosocial support, Self Care/advanced ADL retraining, Therapeutic Activities, UE/LE Strength taining/ROM, Therapeutic Exercise, UE/LE Coordination activities, Visual/perceptual remediation/compensation, Splinting/orthotics  SLP Interventions Dysphagia/aspiration precaution training, Environmental controls, Speech/Language facilitation, Internal/external aids, Patient/family education, Functional tasks  TR Interventions    SW/CM Interventions Discharge Planning, Psychosocial Support, Patient/Family Education   Barriers to Discharge MD  Medical stability  Nursing      PT Inaccessible home environment, Decreased caregiver support, Home environment access/layout, Lack of/limited family support    OT      SLP      SW Decreased caregiver support, Insurance for SNF coverage Wife feels limited in what she can assist pt with and was made aware their  insurance may not assist with SNF at [facility]   Team Discharge Planning: Destination: PT-Home ,OT- Home , SLP-Home Projected Follow-up: PT-24 hour supervision/assistance, OT-  Home health OT, SLP-Home Health SLP, 24 hour supervision/assistance Projected Equipment Needs: PT-To be determined, OT- Tub/shower bench, To  be determined, 3 in 1 bedside comode, SLP-None recommended by SLP Equipment Details: PT- , OT-  Patient/family involved in discharge planning: PT- Patient,  OT-Patient, SLP-Patient  MD ELOS: 18-21d Medical Rehab Prognosis:  Excellent Assessment:  78 year old male with history of 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 2D echo showed EF 55-60 % without wall abnormalities. TCD negative for PFO. Wife reported hx of arrhthymias in the past and telemetry revealed frequent PVCs with pauses therefore Dr. Irish Lack consulted for input. He recommended holding BB and increasing home dose Eliquis to 5 mg bid   Now requiring 24/7 Rehab RN,MD, as well as CIR level PT, OT and SLP.  Treatment team will focus on ADLs and mobility with goals set at min assist See Team Conference Notes for weekly updates to the plan of care

## 2019-06-10 NOTE — Progress Notes (Signed)
[place] PHYSICAL MEDICINE & REHABILITATION PROGRESS NOTE   Subjective/Complaints: Patient is having some knee pain in his paretic side.  Reduce weightbearing noticed in physical therapy and difficult to say how much is due to weakness versus pain in the knee.  Will check x-ray  ROS: limited due to language/communication    Objective:   No results found. Recent Labs    06/08/19 0611  WBC 8.6  HGB 12.8*  HCT 37.9*  PLT 290   Recent Labs    06/08/19 0611  NA 135  K 3.6  CL 101  CO2 23  GLUCOSE 220*  BUN 19  CREATININE 0.84  CALCIUM 8.8*    Intake/Output Summary (Last 24 hours) at 06/10/2019 0743 Last data filed at 06/09/2019 2230 Gross per 24 hour  Intake 180 ml  Output 1 ml  Net 179 ml     Physical Exam: Vital Signs Blood pressure 132/68, pulse 82, temperature 98.4 F (36.9 C), temperature source Oral, resp. rate 16, height 6' (1.829 m), weight 116.2 kg, SpO2 94 %.  Constitutional: No distress . Vital signs reviewed. obese HEENT: EOMI, oral membranes moist Neck: supple Cardiovascular: RRR without murmur. No JVD    Respiratory: CTA Bilaterally without wheezes or rales. Normal effort    GI: BS +, non-tender, non-distended   Musculoskeletal:  General: No deformityor edema.  There is no evidence of knee effusion in the right knee. Neurological: awoke easily. Garbled speech, expressive language deficits. Followed basic commands.  RUE 1/5 prox to 1+/5 wrist, hand. Marland Kitchen RLE 2-/5 prox to 1-2/5 distally with inconsistent engagement, apraxia. LUE and LLE 5/5. Senses pain right arm and leg. Skin: Skin iswarm. He isnot diaphoretic. Scattered abrasions right arm and leg remain Psychiatric: remains a little distracted   Assessment/Plan: 1. 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  Physiatrist is providing close team supervision and 24 hour  management of active medical problems listed below.  Physiatrist and rehab team continue to assess barriers to discharge/monitor patient progress toward functional and medical goals  Care Tool:  Bathing    Body parts bathed by patient: Chest, Abdomen, Front perineal area, Right arm, Right upper leg, Left upper leg, Face   Body parts bathed by helper: Right lower leg, Left lower leg, Buttocks, Left arm     Bathing assist Assist Level: 2 Helpers     Upper Body Dressing/Undressing Upper body dressing   What is the patient wearing?: Pull over shirt    Upper body assist Assist Level: Moderate Assistance - Patient 50 - 74%    Lower Body Dressing/Undressing Lower body dressing      What is the patient wearing?: Incontinence brief, Pants     Lower body assist Assist for lower body dressing: 2 Helpers     Toileting Toileting    Toileting assist Assist for toileting: 2 Helpers     Transfers Chair/bed transfer  Transfers assist     Chair/bed transfer assist level: 2 Helpers     Locomotion Ambulation   Ambulation assist   Ambulation activity did not occur: Safety/medical concerns          Walk 10 feet activity   Assist  Walk 10 feet activity did not occur: Safety/medical concerns        Walk 50 feet activity   Assist Walk 50 feet with 2 turns activity did not occur: Safety/medical concerns  Walk 150 feet activity   Assist Walk 150 feet activity did not occur: Safety/medical concerns         Walk 10 feet on uneven surface  activity   Assist Walk 10 feet on uneven surfaces activity did not occur: Safety/medical concerns         Wheelchair     Assist Will patient use wheelchair at discharge?: (TBD)             Wheelchair 50 feet with 2 turns activity    Assist            Wheelchair 150 feet activity     Assist          Medical Problem List and Plan: 1.Functional and cognitive  deficitssecondary to left MCA infarct CIR PT ,OT, SLP2. Antithrombotics: -DVT/anticoagulation:Pharmaceutical:Other (comment)--Eliquis -antiplatelet therapy: Plavix 3. Pain Management:tylenol prn 4. Mood:LCSW to follow for evaluation and support. -antipsychotic agents: N/A 5. Neuropsych: This patientis notcapable of making decisions on hisown behalf. 6. Skin/Wound Care:Routine pressure relief measures 7. Fluids/Electrolytes/Nutrition:intake poor over the last 48+ hours  I 170ml yesterday , may need supplemental IVF, will check BMET in am    -check daily weights 8. CAD: On Lipitor and Plavix --no BB due to intermittent pauses.  9. T2DM with neuropathy: Monitor BS ac/hs. Was on metformin, Glipizide and Lantus at home--currently Lantus 10 units at bedtime.   -cbgs uncontrolled despite poor po intake  -resumed metformin bid for meal coverage.   -increase lantus to 25u hs CBG (last 3)  Recent Labs    06/09/19 1708 06/09/19 2130 06/10/19 0626  GLUCAP 206* 190* 174*  am CBG still elevated increase lantus 10.Hypoxia: Has been weaned off oxygen. Continue to monitor for recurrent fevers. 11. LUL pulmonary nodule: follow up CT in 6-12 months 12.  Right knee pain suspect osteoarthritis aggravated by immobility due to CVA, will check x-rays  LOS: 3 days A FACE TO FACE EVALUATION WAS PERFORMED  Charlett Blake 06/10/2019, 7:43 AM

## 2019-06-10 NOTE — Progress Notes (Signed)
Occupational Therapy Session Note  Patient Details  Name: Luke Rivera MRN: [id number] Date of Birth: [birth year]  Today's Date: 06/10/2019 OT Individual Time: 7948-0165 OT Individual Time Calculation (min): 58 min    Short Term Goals: Week 1:  OT Short Term Goal 1 (Week 1): Pt will be able to sit to stand with mod A of 1 to prepare for toileting tasks. OT Short Term Goal 2 (Week 1): Pt will be able to transfer to [facility] and/ or toilet with mod A of 1. OT Short Term Goal 3 (Week 1): Pt will be able to don shirt with min A. OT Short Term Goal 4 (Week 1): Pt will use RUE with mod A to bathe L arm. OT Short Term Goal 5 (Week 1): Pt will don pants with max A.  Skilled Therapeutic Interventions/Progress Updates:    Pt completed supine to sit EOB with mod assist to start session.  He needed max assist for donning shoes EOB.  Total assist for stand pivot transfer to the wheelchair with pt then requesting to use the bathroom through gesturing and inconsistent "yes/no".  Stedy utilized for transfer with max assist sit to stand as well as total assist for clothing management and toilet hygiene.  Transferred from lower toilet with total assist sit to stand using the Stedy to the wheelchair at the sink, where he engaged in grooming tasks with min assist including washing of his hands and combing his hair.  Next, took pt down to the therapy gym where he worked on stand pivot transfers, standing balance and pre-gait stepping with total assist +2 (pt 30%) using three muskateers technique.  Finished session with return to the room and pt left sitting up in the wheelchair with right arm support in place, call button and phone in reach, and chair alarm positioned.    Therapy Documentation Precautions:  Precautions Precautions: Fall Restrictions Weight Bearing Restrictions: No  Pain: Pain Assessment Pain Scale: Faces Pain Score: 0-No pain ADL: See Care Tool Section for some details of ADL  Therapy/Group:  Individual Therapy  Caira Poche OTR/L 06/10/2019, 4:39 PM

## 2019-06-10 NOTE — Progress Notes (Signed)
Speech Language Pathology Daily Session Note  Patient Details  Name: Luke Rivera MRN: [id number] Date of Birth: [birth year]  Today's Date: 06/10/2019 SLP Individual Time: 4158-3094 SLP Individual Time Calculation (min): 30 min  Short Term Goals: Week 1: SLP Short Term Goal 1 (Week 1): Pt will construct noun + verb utterances when describing pictures with 80% accuracy and multimodal cues. SLP Short Term Goal 2 (Week 1): Pt will respond to questions with a clear/intelligible YES/NO on 8/10 opportunities. SLP Short Term Goal 3 (Week 1): Pt will listen to short, simple paragraphs and respond to yes/no questions with 80% accuracy. SLP Short Term Goal 4 (Week 1): Pt will attend to right side of mouth and clear oral residue with lingual sweep on 5/7 opportunities independently.  Skilled Therapeutic Interventions:  Skilled treatment session focused on communication and dysphagia. SLP facilitated session by providing skilled observation of pt consuming regular diet. Pt with minimal anterior spillage and minimal oral residue on right. Pt appears to manage residue well with independent liquid washes. Pt appears appropriate for diet upgrade to regular. Education provided. SLP further facilitated session by providing Max A to achieve ~ 25% accuracy answering basic simple yes/no questions. Additionally, pt provides additional information in response to yes/no questions. However only ~ 25% of responses are communicative d/t language deficits and rapid imprecise speech. Pt is not stimulable for repetition of utterance utilizing "big and loud speech." Pt left upright in wheelchair, lap belt alarm on and all needs within reach. Continue per current plan of care.      Pain    Therapy/Group: Individual Therapy  Luke Rivera 06/10/2019, 9:37 AM

## 2019-06-11 ENCOUNTER — Inpatient Hospital Stay (HOSPITAL_COMMUNITY): Payer: Medicare HMO | Admitting: Occupational Therapy

## 2019-06-11 ENCOUNTER — Inpatient Hospital Stay (HOSPITAL_COMMUNITY): Payer: Medicare HMO | Admitting: Physical Therapy

## 2019-06-11 ENCOUNTER — Inpatient Hospital Stay (HOSPITAL_COMMUNITY): Payer: Medicare HMO | Admitting: Speech Pathology

## 2019-06-11 LAB — BASIC METABOLIC PANEL
Anion gap: 12 (ref 5–15)
BUN: 17 mg/dL (ref 8–23)
CO2: 23 mmol/L (ref 22–32)
Calcium: 8.8 mg/dL — ABNORMAL LOW (ref 8.9–10.3)
Chloride: 101 mmol/L (ref 98–111)
Creatinine, Ser: 0.96 mg/dL (ref 0.61–1.24)
GFR calc Af Amer: >60 mL/min (ref >60)
GFR calc non Af Amer: >60 mL/min (ref >60)
Glucose, Bld: 193 mg/dL — ABNORMAL HIGH (ref 70–99)
Potassium: 3.4 mmol/L — ABNORMAL LOW (ref 3.5–5.1)
Sodium: 136 mmol/L (ref 135–145)

## 2019-06-11 LAB — GLUCOSE, CAPILLARY
Glucose-Capillary: 178 mg/dL — ABNORMAL HIGH (ref 70–99)
Glucose-Capillary: 191 mg/dL — ABNORMAL HIGH (ref 70–99)
Glucose-Capillary: 226 mg/dL — ABNORMAL HIGH (ref 70–99)
Glucose-Capillary: 202 mg/dL — ABNORMAL HIGH (ref 70–99)

## 2019-06-11 MED ORDER — METFORMIN HCL 850 MG PO TABS
850.0000 mg | ORAL_TABLET | Freq: Two times a day (BID) | ORAL | Status: DC
  Administered 2019-06-11 – 2019-06-24 (×27): 850 mg via ORAL
  Filled 2019-06-11 (×28): qty 1

## 2019-06-11 NOTE — Progress Notes (Signed)
Pt was very agitated/irritable through out the night, and unable to verbalize his needs. This nurse will continue to monitor.

## 2019-06-11 NOTE — Progress Notes (Signed)
Speech Language Pathology Daily Session Note  Patient Details  Name: Luke Rivera MRN: [id number] Date of Birth: December 13, 1940  Today's Date: 06/11/2019 SLP Individual Time: 0926-0956 SLP Individual Time Calculation (min): 30 min  Short Term Goals: Week 1: SLP Short Term Goal 1 (Week 1): Pt will construct noun + verb utterances when describing pictures with 80% accuracy and multimodal cues. SLP Short Term Goal 2 (Week 1): Pt will respond to questions with a clear/intelligible YES/NO on 8/10 opportunities. SLP Short Term Goal 3 (Week 1): Pt will listen to short, simple paragraphs and respond to yes/no questions with 80% accuracy. SLP Short Term Goal 4 (Week 1): Pt will attend to right side of mouth and clear oral residue with lingual sweep on 5/7 opportunities independently.  Skilled Therapeutic Interventions: Patient received skilled SLP services targeting communication goals. Patient responded to basic biographical yes/no questions with 80% accuracy utilizing a clear and intelligible yes/no. Patient identified common objects in his room with 90% accuracy following min phonemic cues. During therapy session patient's spouse called with questions regarding aphasia. Education was provided to spouse regarding what aphasia is and communication strategies to utilize when speaking with her husband. At the end of therapy session patient was upright in bed, bed alarm set, all needs within reach.  Pain Pain Assessment Pain Scale: 0-10 Pain Score: 0-No pain  Therapy/Group: Individual Therapy  Cristy Folks 06/11/2019, 12:29 PM

## 2019-06-11 NOTE — Progress Notes (Signed)
Physical Therapy Session Note  Patient Details  Name: Luke Rivera MRN: [id number] Date of Birth: May 13, 1941  Today's Date: 06/11/2019 PT Individual Time: 0258-5277 PT Individual Time Calculation (min): 79 min   Short Term Goals: Week 1:  PT Short Term Goal 1 (Week 1): Pt will perform supine<>sit with max assist of 1 PT Short Term Goal 2 (Week 1): Pt will perform bed<>chair transfers with max assist of 1 PT Short Term Goal 3 (Week 1): Pt will perform sit<>stand with max assist of 1 using LRAD  Skilled Therapeutic Interventions/Progress Updates:    Pt received supine in bed and agreeable to therapy session. Continues to be aphasic with expressive more impaired that receptive; however, is able to verbalize 2-3 word phrases during session in response to therapist's questions. Supine>sit with mod assist of 1 for B LE management and trunk upright using bedrails and cuing for sequencing. Performed LB clothing management with max assist for time management. Sit>stand EOB>stedy with max assist of 1 and mod assist of 2nd person for lifting/balance - cuing for increased anterior trunk lean and hip/knee extension. Dependent stedy transfer EOB>w/c. Transported to/from gym in w/c. L lateral scoot transfer w/c>EOM using transfer board with max assist for board placement and mod assist of 1 for scooting - cuing for proper UE placement and increased time during transfer to allow pt time to problem solve sequencing with mod cuing. Sit<>stand elevated EOM<>3 [place] UE support with mirror feedback for improved upright posture and mod assist of 2 for lifting and balance upon coming to standing  - with increased repetitions pt able to demonstrate increasing trunk/hip extension for upright posture - tolerates standing ~30 seconds prior to requesting to return to sitting. Pt frequently moaning/groaning while standing indicating some level of discomfort but pt unable to clarify despite therapist questioning. Attempted  progression to R LE stepping forward/backwards with pt performing this x2 but then requesting to sit. Pt attempting to communicate headache pain or lightheadedness during standing but unclear due to aphasia. Vitals assessed in sitting BP 135/90 (MAP 103), HR 101bpm; standing BP 80/64 (MAP 71), HR 137bpm (unable to determine if this is an accurate reading because pt pushing with that L UE to assist with standing balance). Reassessed immediately upon sitting: BP 139/77 (MAP 98), HR 126bpm. R lateral scoot transfer EOM>w/c using transfer board with max assist for board placement and mod assist for scooting and R LE positioning,with cuing for increased anterior trunk lean and proper UE positoining. L squat pivot transfer w/c>EOM with mod assist of 2 for lifting/pivoting hips with cuing for increased anterior trunk lean. Participated in sitting EOM R UE NMR task of picking up and placing bean bags with hand-over-hand assistance and cuing for increased finger extension/flexion to grasp item. Attempted R lateral trunk lean onto R UE forearm support but pt moaning/groaning indicating discomfort but unable to clarify further due to aphasia therefore deferred this activity today. L lateral scoot transfer EOM>w/c using transfer board with max assist for board placement and mod A of 1 for scooting hips. Pt communicating need to urinate. Transported back to room in w/c. Therapist assisted with placing urinal and pt continent of urine. Pt left sitting in w/c with needs in reach and seat belt alarm on.  Therapy Documentation Precautions:  Precautions Precautions: Fall Restrictions Weight Bearing Restrictions: No  Pain: Pt frequently moaning/groaning during activity indicating some level of discomfort but pt unable to clarify further due to aphasia - therapist assisted pt in repositioning prior to continuing  task or deferred performing a task today if unable to find more comfortable positioning.   Therapy/Group:  Individual Therapy  Tawana Scale, PT, DPT 06/11/2019, 7:57 AM

## 2019-06-11 NOTE — Progress Notes (Signed)
Occupational Therapy Session Note  Patient Details  Name: Luke Rivera MRN: [id number] Date of Birth: 25-Jan-1941  Today's Date: 06/11/2019 OT Individual Time: 0109-3235 OT Individual Time Calculation (min): 68 min    Short Term Goals: Week 1:  OT Short Term Goal 1 (Week 1): Pt will be able to sit to stand with mod A of 1 to prepare for toileting tasks. OT Short Term Goal 2 (Week 1): Pt will be able to transfer to [facility] and/ or toilet with mod A of 1. OT Short Term Goal 3 (Week 1): Pt will be able to don shirt with min A. OT Short Term Goal 4 (Week 1): Pt will use RUE with mod A to bathe L arm. OT Short Term Goal 5 (Week 1): Pt will don pants with max A.  Skilled Therapeutic Interventions/Progress Updates:    Pt completed bathing and dressing during session.  Total assist for transfer in and out of the shower using the [facility] for support.  Once in the shower he needed total assist for removal of LB clothing including shoes, TEDs, and brief.  Mod assist for pulling the shirt over his head and then he could complete the rest with mod instructional cueing.  He needed mod demonstrational cueing with min assist for UB bathing.  Max hand over hand assist to integrate the RUE into washing the left arm.  Noted increased adductor tone in the right arm as well as flexor tone.  Did not attempt standing in the shower secondary to safety with pt also needing assist with washing his feet.  Completed washing buttocks in standing with use of the Stedy after transfer out of the shower.  Max assist for donning brief and pants following hemi dressing techniques.  Mod assist for donning pullover shirt as well.  Finished session with stand pivot transfer to the bed with total assist.  Pt with increased trunk flexion during transfer as well as with standing at the sink during LB dressing.  Increased fatigue noted with pt moaning throughout session with every taxing activity.  Pt left in bed with RUE positioned on pillow and  call button and phone in reach.  Bed alarm in place as well.   Therapy Documentation Precautions:  Precautions Precautions: Fall Precaution Comments: R side inattention Restrictions Weight Bearing Restrictions: No  Pain: Pain Assessment Pain Scale: Faces Pain Score: 0-No pain ADL: See Care Tool Section for some details of ADL   Therapy/Group: Individual Therapy  Decker Cogdell OTR/L 06/11/2019, 3:32 PM

## 2019-06-11 NOTE — Progress Notes (Signed)
[facility] PHYSICAL MEDICINE & REHABILITATION PROGRESS NOTE   Subjective/Complaints: No knee pain complaints.  Reviewed x-rays.  ROS: limited due to language/communication    Objective:   Dg Knee 1-2 Views Right  Result Date: 06/10/2019 CLINICAL DATA:  Right knee pain. EXAM: RIGHT KNEE - 1-2 VIEW COMPARISON:  None. FINDINGS: No evidence of fracture, dislocation, or joint effusion. No evidence of arthropathy. Minimal patellar spurring is noted. Soft tissues are unremarkable. IMPRESSION: No significant abnormality seen in the right knee. Electronically Signed   By: Marijo Conception M.D.   On: 06/10/2019 17:30   No results for input(s): WBC, HGB, HCT, PLT in the last 72 hours. Recent Labs    06/11/19 0523  NA 136  K 3.4*  CL 101  CO2 23  GLUCOSE 193*  BUN 17  CREATININE 0.96  CALCIUM 8.8*    Intake/Output Summary (Last 24 hours) at 06/11/2019 9924 Last data filed at 06/11/2019 0919 Gross per 24 hour  Intake 420 ml  Output -  Net 420 ml     Physical Exam: Vital Signs Blood pressure (!) 139/53, pulse 76, temperature 97.9 F (36.6 C), temperature source Oral, resp. rate 20, height 6' (1.829 m), weight 115.6 kg, SpO2 95 %.  Constitutional: No distress . Vital signs reviewed. obese HEENT: EOMI, oral membranes moist Neck: supple Cardiovascular: RRR without murmur. No JVD    Respiratory: CTA Bilaterally without wheezes or rales. Normal effort    GI: BS +, non-tender, non-distended   Musculoskeletal:  General: No deformityor edema.  There is no evidence of knee effusion in the right knee.  No pain with right knee range of motion Neurological: awoke easily. Garbled speech, expressive language deficits. Followed basic commands.  RUE 1/5 prox to 1+/5 wrist, hand. Marland Kitchen RLE 2-/5 prox to 1-2/5 distally with inconsistent engagement, apraxia. LUE and LLE 5/5. Senses pain right arm and leg. Skin: Skin iswarm. He isnot diaphoretic. Scattered abrasions right arm and leg  remain Psychiatric: remains a little distracted   Assessment/Plan: 1. 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  Physiatrist is providing close team supervision and 24 hour management of active medical problems listed below.  Physiatrist and rehab team continue to assess barriers to discharge/monitor patient progress toward functional and medical goals  Care Tool:  Bathing    Body parts bathed by patient: Chest, Abdomen, Front perineal area, Right arm, Right upper leg, Left upper leg, Face   Body parts bathed by helper: Right lower leg, Left lower leg, Buttocks, Left arm     Bathing assist Assist Level: 2 Helpers     Upper Body Dressing/Undressing Upper body dressing   What is the patient wearing?: Pull over shirt    Upper body assist Assist Level: Moderate Assistance - Patient 50 - 74%    Lower Body Dressing/Undressing Lower body dressing      What is the patient wearing?: Incontinence brief     Lower body assist Assist for lower body dressing: Total Assistance - Patient < 25%     Toileting Toileting    Toileting assist Assist for toileting: 2 Helpers     Transfers Chair/bed transfer  Transfers assist     Chair/bed transfer assist level: Total Assistance - Patient < 25%(stand pivot)     Locomotion Ambulation   Ambulation assist   Ambulation activity did not occur: Safety/medical concerns  Walk 10 feet activity   Assist  Walk 10 feet activity did not occur: Safety/medical concerns        Walk 50 feet activity   Assist Walk 50 feet with 2 turns activity did not occur: Safety/medical concerns         Walk 150 feet activity   Assist Walk 150 feet activity did not occur: Safety/medical concerns         Walk 10 feet on uneven surface  activity   Assist Walk 10 feet on uneven surfaces activity did not occur:  Safety/medical concerns         Wheelchair     Assist Will patient use wheelchair at discharge?: Yes(TBD) Type of Wheelchair: Manual Wheelchair activity did not occur: Safety/medical concerns(Per report )         Wheelchair 50 feet with 2 turns activity    Assist            Wheelchair 150 feet activity     Assist          Medical Problem List and Plan: 1.Functional and cognitive deficitssecondary to left MCA infarct CIR PT ,OT, SLP2. Antithrombotics: -DVT/anticoagulation:Pharmaceutical:Other (comment)--Eliquis -antiplatelet therapy: Plavix 3. Pain Management:tylenol prn 4. Mood:LCSW to follow for evaluation and support. -antipsychotic agents: N/A 5. Neuropsych: This patientis notcapable of making decisions on hisown behalf. 6. Skin/Wound Care:Routine pressure relief measures 7. Fluids/Electrolytes/Nutrition:intake poor over the last 48+ hours  I 173ml yesterday , may need supplemental IVF, will check BMET in am    -check daily weights 8. CAD: On Lipitor and Plavix --no BB due to intermittent pauses.  9. T2DM with neuropathy: Monitor BS ac/hs. Was on metformin, Glipizide and Lantus at home--currently Lantus 10 units at bedtime.   -cbgs uncontrolled despite poor po intake  -resumed metformin bid for meal coverage.   -increase lantus to 25u hs CBG (last 3)  Recent Labs    06/10/19 1645 06/10/19 2132 06/11/19 0629  GLUCAP 212* 197* 178*  am CBG still elevated increase lantus, will increase metformin to 850 10.Hypoxia: Has been weaned off oxygen. Continue to monitor for recurrent fevers. 11. LUL pulmonary nodule: follow up CT in 6-12 months 12.  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  May be more of a meniscal versus soft tissue etiology, currently appears comfortable no pain with range of motion  LOS: 4 days A  FACE TO FACE EVALUATION WAS PERFORMED  Charlett Blake 06/11/2019, 9:37 AM

## 2019-06-12 ENCOUNTER — Inpatient Hospital Stay (HOSPITAL_COMMUNITY): Payer: Medicare HMO

## 2019-06-12 ENCOUNTER — Inpatient Hospital Stay (HOSPITAL_COMMUNITY): Payer: Medicare HMO | Admitting: Occupational Therapy

## 2019-06-12 DIAGNOSIS — D62 Acute posthemorrhagic anemia: Secondary | ICD-10-CM

## 2019-06-12 DIAGNOSIS — I63511 Cerebral infarction due to unspecified occlusion or stenosis of right middle cerebral artery: Secondary | ICD-10-CM

## 2019-06-12 DIAGNOSIS — G8929 Other chronic pain: Secondary | ICD-10-CM

## 2019-06-12 DIAGNOSIS — E876 Hypokalemia: Secondary | ICD-10-CM

## 2019-06-12 DIAGNOSIS — M25561 Pain in right knee: Secondary | ICD-10-CM

## 2019-06-12 DIAGNOSIS — E1142 Type 2 diabetes mellitus with diabetic polyneuropathy: Secondary | ICD-10-CM

## 2019-06-12 LAB — GLUCOSE, CAPILLARY
Glucose-Capillary: 172 mg/dL — ABNORMAL HIGH (ref 70–99)
Glucose-Capillary: 176 mg/dL — ABNORMAL HIGH (ref 70–99)
Glucose-Capillary: 183 mg/dL — ABNORMAL HIGH (ref 70–99)
Glucose-Capillary: 175 mg/dL — ABNORMAL HIGH (ref 70–99)

## 2019-06-12 NOTE — Progress Notes (Signed)
Occupational Therapy Session Note  Patient Details  Name: Luke Rivera MRN: [id number] Date of Birth: [birth year]  Today's Date: 06/12/2019 OT Individual Time: 1100-1200 OT Individual Time Calculation (min): 60 min    Short Term Goals: Week 1:  OT Short Term Goal 1 (Week 1): Pt will be able to sit to stand with mod A of 1 to prepare for toileting tasks. OT Short Term Goal 2 (Week 1): Pt will be able to transfer to [facility] and/ or toilet with mod A of 1. OT Short Term Goal 3 (Week 1): Pt will be able to don shirt with min A. OT Short Term Goal 4 (Week 1): Pt will use RUE with mod A to bathe L arm. OT Short Term Goal 5 (Week 1): Pt will don pants with max A.  Skilled Therapeutic Interventions/Progress Updates:    Patient supine in bed, able to get occ spontaneous phrases expressed clearly otherwise appears frustrated at inability to share wants and needs.  Able to roll in bed with min A for changing brief/hygiene and LB washing.  LB dressing with max A, LB bathing with max A.  Dependent for support socks and shoes.  Supine to SSP with mod A.  Good tolerance for unsupported sitting with CS.  He completes UB washing and dressing seated edge of bed.  Mod A to donn OH shirt, min a for UB bathing.  Sit pivot transfer bed to w/c with mod A of one and min A of 2nd.  Grooming at w/c level with set up assist.  Completed seated balance activities edge of mat and right UE AAROM.  Replaced w/c with higher option to allow for improved hip/knee positioning.  Sit pivot transfer to/from w/c and mat with mod a of 1 on min a of 2nd.  Patient remained in the w/c at close of session.  Seat belt alarm set and tray table set up for lunch.    Therapy Documentation Precautions:  Precautions Precautions: Fall Precaution Comments: R side inattention Restrictions Weight Bearing Restrictions: No General:   Vital Signs:   Pain: Pain Assessment Pain Scale: 0-10 Pain Score: 0-No pain   Other Treatments:      Therapy/Group: Individual Therapy  Carlos Levering 06/12/2019, 12:08 PM

## 2019-06-12 NOTE — Progress Notes (Signed)
Physical Therapy Session Note  Patient Details  Name: Luke Rivera MRN: [id number] Date of Birth: [birth year]  Today's Date: 06/12/2019 PT Individual Time: 1300-1400 PT Individual Time Calculation (min): 60 min   Short Term Goals: Week 1:  PT Short Term Goal 1 (Week 1): Pt will perform supine<>sit with max assist of 1 PT Short Term Goal 2 (Week 1): Pt will perform bed<>chair transfers with max assist of 1 PT Short Term Goal 3 (Week 1): Pt will perform sit<>stand with max assist of 1 using LRAD  Skilled Therapeutic Interventions/Progress Updates:    Patient in w/c in room.  Propelled w/c with L UE/LE with hemi-technique with min to mod A max cues and A for using L LE to steer w/c.  Patient sit <>stand in hallway max A to +2 A for safety.  Held wall rail and R arm over PT shoulder, then 3 musketeer's with tech and PT.  Performed lateral weight shifts, but pt c/o pain and sat down.  Second trial, pt c/o pain so static standing in midline with cues for trunk extension, support via 2 musketeer's and facilitation for hip extension & knee control on R.  Patient requesting to toilet so returned to room in w/c and +2 to bathroom via [place].  Assist for doff/donning pants and brief.  Back in gym performed slide board transfer w/c to mat toward L side min A.  Sitting balance reaching forward and up for placing cards matching on back of mirror.  Ball toss with L UE mainly punching ball to tech with close S to minguard A for balance.  Encouraged to use R UE to punch ball with pt performing about 3-4 trials.  Patient transferred to w/c to R side via SBT with mod A for forward and L lateral lean.  Patient assisted to room and pivot transfer to recliner to L with mod A.  Left up in recliner with alarm belt activated and call bell in reach.   Therapy Documentation Precautions:  Precautions Precautions: Fall Precaution Comments: R side inattention Restrictions Weight Bearing Restrictions: No Pain: Pain  Assessment Pain Scale: 0-10 Faces Pain Scale: Hurts even more Pain Type: Acute pain Pain Location: Knee Pain Orientation: Left Pain Descriptors / Indicators: Guarding;Grimacing;Moaning Pain Onset: With Activity Pain Intervention(s): Repositioned;Emotional support    Therapy/Group: Individual Therapy  Reginia Naas  Magda Kiel, PT 06/12/2019, 4:16 PM

## 2019-06-12 NOTE — Progress Notes (Addendum)
[facility] PHYSICAL MEDICINE & REHABILITATION PROGRESS NOTE   Subjective/Complaints: Patient seen sitting up this morning.  He states he slept well overnight.  He is slow to process.  ROS: limited due to cognition, but appears to deny CP, shortness of breath, nausea, vomiting, diarrhea  Objective:   Dg Knee 1-2 Views Right  Result Date: 06/10/2019 CLINICAL DATA:  Right knee pain. EXAM: RIGHT KNEE - 1-2 VIEW COMPARISON:  None. FINDINGS: No evidence of fracture, dislocation, or joint effusion. No evidence of arthropathy. Minimal patellar spurring is noted. Soft tissues are unremarkable. IMPRESSION: No significant abnormality seen in the right knee. Electronically Signed   By: Marijo Conception M.D.   On: 06/10/2019 17:30   No results for input(s): WBC, HGB, HCT, PLT in the last 72 hours. Recent Labs    06/11/19 0523  NA 136  K 3.4*  CL 101  CO2 23  GLUCOSE 193*  BUN 17  CREATININE 0.96  CALCIUM 8.8*    Intake/Output Summary (Last 24 hours) at 06/12/2019 0931 Last data filed at 06/12/2019 0800 Gross per 24 hour  Intake 180 ml  Output 425 ml  Net -245 ml     Physical Exam: Vital Signs Blood pressure 126/66, pulse 81, temperature 97.9 F (36.6 C), temperature source Oral, resp. rate 16, height 6' (1.829 m), weight 114.5 kg, SpO2 95 %.  Constitutional: No distress . Vital signs reviewed. HENT: Normocephalic.  Atraumatic. Eyes: EOMI. No discharge. Cardiovascular: No JVD. Respiratory: Normal effort. GI: Non-distended. Musc: No edema or tenderness in extremities. Neurological: Alert  Expressive aphasia  Dysarthria  Motor: RUE 1+/5 proximal distal  RLE 2-/5 proximal to distal  LUE/LLE 5/5 proximal to distal Skin: Warm and dry.  Intact. Psychiatric: Unable to assess due to cognition  Assessment/Plan: 1. 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  Physiatrist is  providing close team supervision and 24 hour management of active medical problems listed below.  Physiatrist and rehab team continue to assess barriers to discharge/monitor patient progress toward functional and medical goals  Care Tool:  Bathing    Body parts bathed by patient: Right arm, Chest, Abdomen, Front perineal area, Right upper leg, Left upper leg, Face   Body parts bathed by helper: Buttocks, Left arm, Right lower leg, Left lower leg     Bathing assist Assist Level: Total Assistance - Patient < 25%     Upper Body Dressing/Undressing Upper body dressing   What is the patient wearing?: Pull over shirt    Upper body assist Assist Level: Moderate Assistance - Patient 50 - 74%    Lower Body Dressing/Undressing Lower body dressing      What is the patient wearing?: Incontinence brief, Pants     Lower body assist Assist for lower body dressing: Total Assistance - Patient < 25%     Toileting Toileting    Toileting assist Assist for toileting: 2 Helpers     Transfers Chair/bed transfer  Transfers assist     Chair/bed transfer assist level: Total Assistance - Patient < 25%     Locomotion Ambulation   Ambulation assist   Ambulation activity did not occur: Safety/medical concerns          Walk 10 feet activity   Assist  Walk 10 feet activity did not occur: Safety/medical concerns        Walk 50 feet activity   Assist Walk 50 feet with 2  turns activity did not occur: Safety/medical concerns         Walk 150 feet activity   Assist Walk 150 feet activity did not occur: Safety/medical concerns         Walk 10 feet on uneven surface  activity   Assist Walk 10 feet on uneven surfaces activity did not occur: Safety/medical concerns         Wheelchair     Assist Will patient use wheelchair at discharge?: Yes(TBD) Type of Wheelchair: Manual Wheelchair activity did not occur: Safety/medical concerns(Per report )          Wheelchair 50 feet with 2 turns activity    Assist            Wheelchair 150 feet activity     Assist          Medical Problem List and Plan: 1.Functional and cognitive deficitssecondary to left MCA infarct  Continue CIR  Notes reviewed- stroke, labs personally reviewed 2. Antithrombotics: -DVT/anticoagulation:Pharmaceutical:Other (comment)--Eliquis -antiplatelet therapy: Plavix 3. Pain Management:tylenol prn 4. Mood:LCSW to follow for evaluation and support. -antipsychotic agents: N/A 5. Neuropsych: This patientis notcapable of making decisions on hisown behalf. 6. Skin/Wound Care:Routine pressure relief measures 7. Fluids/Electrolytes/Nutrition: 8. CAD: On Lipitor and Plavix --no BB due to intermittent pauses.  9. T2DM with neuropathy: Monitor BS ac/hs. Was on metformin, Glipizide and Lantus at home.   -resumed metformin bid for meal coverage, increased to 850 on 7/3.   -increase lantus to 20 on 7/2 CBG (last 3)  Recent Labs    06/11/19 1711 06/11/19 2127 06/12/19 0620  GLUCAP 226* 202* 172*   Elevated on 7/4 10.Hypoxia: Has been weaned off oxygen. Continue to monitor for recurrent fevers. 11. LUL pulmonary nodule: follow up CT in 6-12 months 12.  Right knee pain:  ?Meniscal versus soft tissue etiology 13.  Hypokalemia  Potassium 3.4 on 7/3  Labs ordered for Monday 14.  Acute blood loss anemia  Hemoglobin 12.8 on 6/30  Continue to monitor  >35 minutes spent in total with >30 minutes in counseling wife, who wanted to discuss psychosocial issues with patient's daughters, who she does not want speaking to the patient without her consent.  She also seem to suggest that patient would not be coming home at discharge and was upset that patient was under the assumption that he was.  Also discussed stroke prognosis.  Wife states that she could not wait until Monday.  She states she would also like a follow-up call on  Monday.   LOS: 5 days A FACE TO FACE EVALUATION WAS PERFORMED  Ankit Lorie Phenix 06/12/2019, 9:31 AM

## 2019-06-12 NOTE — Progress Notes (Signed)
Speech Language Pathology Daily Session Note  Patient Details  Name: Luke Rivera MRN: [id number] Date of Birth: 1941-10-28  Today's Date: 06/12/2019 SLP Individual Time: 9021-1155 SLP Individual Time Calculation (min): 55 min  Short Term Goals: Week 1: SLP Short Term Goal 1 (Week 1): Pt will construct noun + verb utterances when describing pictures with 80% accuracy and multimodal cues. SLP Short Term Goal 2 (Week 1): Pt will respond to questions with a clear/intelligible YES/NO on 8/10 opportunities. SLP Short Term Goal 3 (Week 1): Pt will listen to short, simple paragraphs and respond to yes/no questions with 80% accuracy. SLP Short Term Goal 4 (Week 1): Pt will attend to right side of mouth and clear oral residue with lingual sweep on 5/7 opportunities independently.  Skilled Therapeutic Interventions: Skilled ST services focused on language skills. SLP facilitated response to simple and mildly complex yes/no questions, pt demonstrated 8 out 10 accuracy. SLP facilitated comprehension of simple paragraphs in response to yes/no questions, pt demonstrated an average of 75% accuracy over 6 paragraphs. SLP facilitated construction of noun verb phrases given simple action based picture cards, pt demonstrated ability to create accurate noun+verb combinations in 8 out 15 opportunities and in 15 out 15 opportunities given mod A sentence completion cues, phonemic cues, and demonstration cues by pt and ST. Pt required min A verbal cues to express wants/needs in response to yes/no questions. Pt demonstrated awareness and attempted to self-correct verbal errors in response to yes/no with min A verbal cues. Pt was left in room with call bell within reach and bed alarm set. ST recommends to continue skilled ST services.      Pain Pain Assessment Pain Scale: 0-10 Pain Score: 0-No pain  Therapy/Group: Individual Therapy  Cyntha Brickman  [facility] 06/12/2019, 3:11 PM

## 2019-06-13 DIAGNOSIS — R0989 Other specified symptoms and signs involving the circulatory and respiratory systems: Secondary | ICD-10-CM

## 2019-06-13 DIAGNOSIS — E663 Overweight: Secondary | ICD-10-CM

## 2019-06-13 DIAGNOSIS — E1169 Type 2 diabetes mellitus with other specified complication: Secondary | ICD-10-CM

## 2019-06-13 LAB — GLUCOSE, CAPILLARY
Glucose-Capillary: 149 mg/dL — ABNORMAL HIGH (ref 70–99)
Glucose-Capillary: 187 mg/dL — ABNORMAL HIGH (ref 70–99)
Glucose-Capillary: 142 mg/dL — ABNORMAL HIGH (ref 70–99)
Glucose-Capillary: 154 mg/dL — ABNORMAL HIGH (ref 70–99)

## 2019-06-13 NOTE — Progress Notes (Signed)
Patients wife called to speak with this nurse regarding her husband getting sick and she  was informed this writer was in another room assisting another patient. After assisting the other patient this writer called the wife back and didn't get an answer. I then informed my charge nurse Caryl Pina, RN  that I did call her(the wife)  back but didn't get an answer. At around 7:35pm the wife called the unit back frantic and upset her call was not returned. After speaking to the wife and informing her and giving her updates on her husband she was calm and "able to rest."  Wife stated she had has "mulipile breakdowns" trying to figure out what patient next step will be, this Probation officer informed her that the medical team would include her and update her with anything new involving her husband. Adria Devon, LPN.

## 2019-06-13 NOTE — Progress Notes (Signed)
[place] PHYSICAL MEDICINE & REHABILITATION PROGRESS NOTE   Subjective/Complaints: Patient seen sitting up in bed this morning.  He states he slept well overnight.  His cognition appears to be improving.  ROS: limited due to cognition, but appears to deny CP, shortness of breath, nausea, vomiting, diarrhea  Objective:   No results found. No results for input(s): WBC, HGB, HCT, PLT in the last 72 hours. Recent Labs    06/11/19 0523  NA 136  K 3.4*  CL 101  CO2 23  GLUCOSE 193*  BUN 17  CREATININE 0.96  CALCIUM 8.8*    Intake/Output Summary (Last 24 hours) at 06/13/2019 0909 Last data filed at 06/13/2019 6440 Gross per 24 hour  Intake 620 ml  Output 700 ml  Net -80 ml     Physical Exam: Vital Signs Blood pressure (!) 147/75, pulse 85, temperature 97.7 F (36.5 C), temperature source Oral, resp. rate 20, height 6' (1.829 m), weight 112.2 kg, SpO2 97 %.  Constitutional: No distress . Vital signs reviewed. HENT: Normocephalic.  Atraumatic. Eyes: EOMI.  No discharge. Cardiovascular: No JVD. Respiratory: Normal effort. GI: Non-distended. Musc: No edema or tenderness in extremities. Neurological: Alert  Expressive aphasia  Dysarthria  Motor: RUE 2+/5 proximal distal  RLE 3-/5 proximal to distal  LUE/LLE 5/5 proximal to distal Skin: Warm and dry.  Intact. Psychiatric: Unable to assess due to cognition  Assessment/Plan: 1. 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  Physiatrist is providing close team supervision and 24 hour management of active medical problems listed below.  Physiatrist and rehab team continue to assess barriers to discharge/monitor patient progress toward functional and medical goals  Care Tool:  Bathing    Body parts bathed by patient: Right arm, Chest, Abdomen, Front perineal area, Right upper leg, Left upper leg, Face   Body parts  bathed by helper: Buttocks, Left arm, Right lower leg, Left lower leg     Bathing assist Assist Level: Moderate Assistance - Patient 50 - 74%     Upper Body Dressing/Undressing Upper body dressing   What is the patient wearing?: Pull over shirt    Upper body assist Assist Level: Moderate Assistance - Patient 50 - 74%    Lower Body Dressing/Undressing Lower body dressing      What is the patient wearing?: Incontinence brief, Pants     Lower body assist Assist for lower body dressing: Maximal Assistance - Patient 25 - 49%     Toileting Toileting    Toileting assist Assist for toileting: 2 Helpers     Transfers Chair/bed transfer  Transfers assist     Chair/bed transfer assist level: Minimal Assistance - Patient > 75%(slide board)     Locomotion Ambulation   Ambulation assist   Ambulation activity did not occur: Safety/medical concerns          Walk 10 feet activity   Assist  Walk 10 feet activity did not occur: Safety/medical concerns        Walk 50 feet activity   Assist Walk 50 feet with 2 turns activity did not occur: Safety/medical concerns         Walk 150 feet activity   Assist Walk 150 feet activity did not occur: Safety/medical concerns         Walk 10 feet on uneven surface  activity   Assist Walk 10 feet on uneven surfaces activity did not occur: Safety/medical  concerns         Wheelchair     Assist Will patient use wheelchair at discharge?: Yes(TBD) Type of Wheelchair: Manual Wheelchair activity did not occur: Safety/medical concerns(Per report )  Wheelchair assist level: Moderate Assistance - Patient 50 - 74% Max wheelchair distance: 19'    Wheelchair 50 feet with 2 turns activity    Assist        Assist Level: Moderate Assistance - Patient 50 - 74%   Wheelchair 150 feet activity     Assist          Medical Problem List and Plan: 1.Functional and cognitive deficitssecondary to left  MCA infarct  Continue CIR 2. Antithrombotics: -DVT/anticoagulation:Pharmaceutical:Other (comment)--Eliquis -antiplatelet therapy: Plavix 3. Pain Management:tylenol prn 4. Mood:LCSW to follow for evaluation and support. -antipsychotic agents: N/A 5. Neuropsych: This patientis notcapable of making decisions on hisown behalf. 6. Skin/Wound Care:Routine pressure relief measures 7. Fluids/Electrolytes/Nutrition: 8. CAD: On Lipitor and Plavix --no BB due to intermittent pauses.  9. T2DM with neuropathy: Monitor BS ac/hs. Was on metformin, Glipizide and Lantus at home.   -resumed metformin bid for meal coverage, increased to 850 on 7/3.   -increase lantus to 20 on 7/2 CBG (last 3)  Recent Labs    06/12/19 1707 06/12/19 2114 06/13/19 0557  GLUCAP 183* 175* 149*   Remains elevated on 7/5, will consider further medication adjustments 10.Hypoxia: Has been weaned off oxygen. Continue to monitor for recurrent fevers. 11. LUL pulmonary nodule: follow up CT in 6-12 months 12.  Right knee pain:  ?Meniscal versus soft tissue etiology 13.  Hypokalemia  Potassium 3.4 on 7/3  Labs ordered for tomorrow 14.  Acute blood loss anemia  Hemoglobin 12.8 on 6/30  Continue to monitor 15.  Labile blood pressure  Labile on 7/5  LOS: 6 days A FACE TO FACE EVALUATION WAS PERFORMED  Aviyanna Colbaugh Lorie Phenix 06/13/2019, 9:09 AM

## 2019-06-13 NOTE — Plan of Care (Signed)
  Problem: Consults Goal: RH STROKE PATIENT EDUCATION Description: See Patient Education module for education specifics  Outcome: Progressing   Problem: RH BOWEL ELIMINATION Goal: RH STG MANAGE BOWEL WITH ASSISTANCE Description: STG Manage Bowel with mod Assistance. Outcome: Progressing Goal: RH STG MANAGE BOWEL W/MEDICATION W/ASSISTANCE Description: STG Manage Bowel with Medication with mod Assistance. Outcome: Progressing   Problem: RH BLADDER ELIMINATION Goal: RH STG MANAGE BLADDER WITH ASSISTANCE Description: STG Manage Bladder With min Assistance Outcome: Progressing   Problem: RH SKIN INTEGRITY Goal: RH STG MAINTAIN SKIN INTEGRITY WITH ASSISTANCE Description: STG Maintain Skin Integrity With min Assistance. Outcome: Progressing   Problem: RH SAFETY Goal: RH STG ADHERE TO SAFETY PRECAUTIONS W/ASSISTANCE/DEVICE Description: STG Adhere to Safety Precautions With min cues/reminders Assistance/Device. Outcome: Progressing   Problem: RH COGNITION-NURSING Goal: RH STG ANTICIPATES NEEDS/CALLS FOR ASSIST W/ASSIST/CUES Description: STG Anticipates Needs/Calls for Assist With  min cues/reminders Assistance/Cues. Outcome: Progressing   Problem: RH KNOWLEDGE DEFICIT Goal: RH STG INCREASE KNOWLEDGE OF DIABETES Description: Pt will be able to manage DM with medications, and diet restrictions using handouts and notebooks with min assist by wife Outcome: Progressing Goal: RH STG INCREASE KNOWLEDGE OF HYPERTENSION Description: Pt will be able to manage HTN with medications, and diet restrictions using handouts and notebooks with min assist by wife Outcome: Progressing Goal: RH STG INCREASE KNOWLEDGE OF DYSPHAGIA/FLUID INTAKE Description: Pt will be able to manage dysphagia and diet restrictions using handouts and notebooks with min assist by wife Outcome: Progressing Goal: RH STG INCREASE KNOWLEGDE OF HYPERLIPIDEMIA Description: Pt will be able to manage HLD with medications, and diet  restrictions using handouts and notebooks with min assist by wife Outcome: Progressing Goal: RH STG INCREASE KNOWLEDGE OF STROKE PROPHYLAXIS Description: Pt will be able to manage secondary stroke prevention with medications, and diet restrictions using handouts and notebooks with min assist by wife Outcome: Progressing

## 2019-06-14 ENCOUNTER — Inpatient Hospital Stay (HOSPITAL_COMMUNITY): Payer: Medicare HMO

## 2019-06-14 ENCOUNTER — Inpatient Hospital Stay (HOSPITAL_COMMUNITY): Payer: Medicare HMO | Admitting: Occupational Therapy

## 2019-06-14 LAB — BASIC METABOLIC PANEL
Anion gap: 9 (ref 5–15)
BUN: 14 mg/dL (ref 8–23)
CO2: 26 mmol/L (ref 22–32)
Calcium: 9 mg/dL (ref 8.9–10.3)
Chloride: 103 mmol/L (ref 98–111)
Creatinine, Ser: 0.97 mg/dL (ref 0.61–1.24)
GFR calc Af Amer: >60 mL/min (ref >60)
GFR calc non Af Amer: >60 mL/min (ref >60)
Glucose, Bld: 158 mg/dL — ABNORMAL HIGH (ref 70–99)
Potassium: 3.4 mmol/L — ABNORMAL LOW (ref 3.5–5.1)
Sodium: 138 mmol/L (ref 135–145)

## 2019-06-14 LAB — GLUCOSE, CAPILLARY
Glucose-Capillary: 139 mg/dL — ABNORMAL HIGH (ref 70–99)
Glucose-Capillary: 192 mg/dL — ABNORMAL HIGH (ref 70–99)
Glucose-Capillary: 186 mg/dL — ABNORMAL HIGH (ref 70–99)
Glucose-Capillary: 140 mg/dL — ABNORMAL HIGH (ref 70–99)

## 2019-06-14 LAB — CBC
HCT: 38.4 % — ABNORMAL LOW (ref 39.0–52.0)
Hemoglobin: 13 g/dL (ref 13.0–17.0)
MCH: 28.9 pg (ref 26.0–34.0)
MCHC: 33.9 g/dL (ref 30.0–36.0)
MCV: 85.3 fL (ref 80.0–100.0)
Platelets: 420 10*3/uL — ABNORMAL HIGH (ref 150–400)
RBC: 4.5 MIL/uL (ref 4.22–5.81)
RDW: 15.3 % (ref 11.5–15.5)
WBC: 6.8 10*3/uL (ref 4.0–10.5)
nRBC: 0 % (ref 0.0–0.2)

## 2019-06-14 MED ORDER — WHITE PETROLATUM EX OINT
TOPICAL_OINTMENT | CUTANEOUS | Status: AC
  Administered 2019-06-14: 14:00:00
  Filled 2019-06-14: qty 28.35

## 2019-06-14 NOTE — Progress Notes (Signed)
Occupational Therapy Session Note  Patient Details  Name: Luke Rivera MRN: [id number] Date of Birth: 12-31-1940  Today's Date: 06/14/2019 OT Individual Time: 0830-0920 OT Individual Time Calculation (min): 50 min    Short Term Goals: Week 1:  OT Short Term Goal 1 (Week 1): Pt will be able to sit to stand with mod A of 1 to prepare for toileting tasks. OT Short Term Goal 2 (Week 1): Pt will be able to transfer to [facility] and/ or toilet with mod A of 1. OT Short Term Goal 3 (Week 1): Pt will be able to don shirt with min A. OT Short Term Goal 4 (Week 1): Pt will use RUE with mod A to bathe L arm. OT Short Term Goal 5 (Week 1): Pt will don pants with max A.   Skilled Therapeutic Interventions/Progress Updates:    [name] called at start of session and spoke with her for a few minutes about patient's progress.  Pt agreeable to therapy and followed verbal and visual directions well today.  With cues he rolled to R side and pushed up to sit with min A.   From EOB, pt doffed shirt with verbal cues and then completed squat pivot transfer to L side to wc with mod A using adequate forward lean and hip lift.   Focused on LB bathing first using sit to stand at sink 3x all with min A. Cued pt to use B hands to push to stand and then reach for sink.  Pt needed A to place R hand on sink and then he was able to hold stand balance with min A and use his L hand to wash front perineal area, partially wash his bottom and pull briefs and pants over hips with 50% A.   He completed UB bathing and dressing sitting in w/c.  Min A to don shirt with cues for technique. S/u for oral care.   Donned pt's own compression socks from home and diabetic shoes with total A.    Pt's shoulder flexion AROM has improved from 20 degrees on admission to approx 80. Pt can lift arm higher but uses flexion synergy with compensatory sh abduction.  Applied Saebo Stim Go ( a portable estim device) to R shoulder to facilitate deltoid function.  Pt indicated the amount of intensity he could handle.  Device is on for 8 sec, off for 8 sec with 35 pps at 300 pulse width.  Applied for 60 min and removed at 1015 am.  Skin in good condition and pt tolerated stimulation well.    Pt's NT arrived to assist getting pt set up with chair belt alarm on.    Therapy Documentation Precautions:  Precautions Precautions: Fall Precaution Comments: R side inattention Restrictions Weight Bearing Restrictions: No    Vital Signs: Therapy Vitals Pulse Rate: (!) 102 BP: 120/72 Patient Position (if appropriate): Standing Oxygen Therapy SpO2: 98 % O2 Device: Room Air Pain: Pain Assessment Pain Score: 0-No pain    Therapy/Group: Individual Therapy  [place] 06/14/2019, 12:22 PM

## 2019-06-14 NOTE — Progress Notes (Signed)
Physical Therapy Session Note  Patient Details  Name: Luke Rivera MRN: [id number] Date of Birth: 09-01-1941  Today's Date: 06/14/2019 PT Individual Time: 8675-4492 and 1515-1600  PT Individual Time Calculation (min): 59 min and 45 min  Short Term Goals: Week 1:  PT Short Term Goal 1 (Week 1): Pt will perform supine<>sit with max assist of 1 PT Short Term Goal 2 (Week 1): Pt will perform bed<>chair transfers with max assist of 1 PT Short Term Goal 3 (Week 1): Pt will perform sit<>stand with max assist of 1 using LRAD  Skilled Therapeutic Interventions/Progress Updates:   Session 1:  Pt seated in w/c upon PT arrival, agreeable to therapy tx and repors "I hurt all over" but unable to rate pain or specify location. Pt transported to the gym in w/c. Pt performed slideboard transfer to the mat with min assist, cues for set up and techniques. Pt performed x 3 sit<>stands this session with RW and mod assist +2, in standing pt able to maintain static standing balance with min assist while working on upright posture and increased hip/trunk extension. In standing pt worked on pre-gait, able to step in place with L LE x 3 and increased difficulty picking up/advancing R LE. During session pt grimacing/moaning throughout, therapists trying to figure out what hurts, trying to rule out orthostatic (with BP checked in both sitting & standing this session), pt unable to elaborate or specify. Pt reports "this is the worst I have ever felt," and "I do not feel good." Pt performed stand pivot to the w/c from mat with mod assist +2 and RW, cues for techniques and assist to help advance R LE. Pt transported to the dayroom and used kinetron while seated in w/c for LE strengthening 2 x 2 min bouts on 50 cm/sec. Pt requesting to get in bed, reports not feeling well. Pt transported back to room, slideboard to bed with min assist and transferred to supine with mod assist. Pt left supine in bed with needs in reach and bed alarm  set.   Session 2: Pt supine in bed upon PT arrival, agreeable to therapy tx and is unable to report pain however does grimace occasionally throughout session. Therapist performed R LE PROM and stretching to hip adductors and hamstrings, pt with adductor tone noted. Pt transferred to sitting EOB with mod assist and performed slideboard transfer to the w/c with min assist and cues for techniques. Pt transported to the gym. Pt performed slideboard transfer to the mat with min assist and cues for techniques. Pt performed x 4 sit<>stands throughout this session with mod-max assist and cues for techniques/anterior weightshift. In standing pt worked on upright posture and overhead reaching activity for hroseshoes x 2 trials with min assist for balance. Pt worked on pre-gait stepping in place with RW for UE support and mod assist x2 trials, when advancing R LE pt with noted adductor tone and scissoring requiring +2 second helper to assist with foot placement. Pt performed stand pivot to w/c with RW mod assist+2 and transported back to room, left in w/c with needs in reach and chair alarm set.    Therapy Documentation Precautions:  Precautions Precautions: Fall Precaution Comments: R side inattention Restrictions Weight Bearing Restrictions: No   Therapy/Group: Individual Therapy  Netta Corrigan, PT, DPT 06/14/2019, 7:48 AM

## 2019-06-14 NOTE — Progress Notes (Signed)
Speech Language Pathology Daily Session Note  Patient Details  Name: Luke Rivera MRN: [id number] Date of Birth: [birth year]  Today's Date: 06/14/2019 SLP Individual Time: 0930-1015 SLP Individual Time Calculation (min): 45 min  Short Term Goals: Week 1: SLP Short Term Goal 1 (Week 1): Pt will construct noun + verb utterances when describing pictures with 80% accuracy and multimodal cues. SLP Short Term Goal 2 (Week 1): Pt will respond to questions with a clear/intelligible YES/NO on 8/10 opportunities. SLP Short Term Goal 3 (Week 1): Pt will listen to short, simple paragraphs and respond to yes/no questions with 80% accuracy. SLP Short Term Goal 4 (Week 1): Pt will attend to right side of mouth and clear oral residue with lingual sweep on 5/7 opportunities independently.  Skilled Therapeutic Interventions:Skilled ST services focused on language skills. SLP facilitated comprehension of complex yes/no questions, pt demonstrated ability to answer 15 out 20 questions correctly. SLP assisted pt in placing lunch order in response to yes/no questions, pt required mod A verbal cues for clarfication and appeared frasurted with inconsistent responses. Pt produced phrases given simple picture cards, following instruction to create noun+adjective+verb or to expend phrase past noun+verb, pt produced 8 extended phrase out of 10 opportunities. For example: "pouring orange juice by himself" and " ironing blue plants." SLP also facilitated reading comprehension at phrase level with 9 out 10 accuracy with min A verbal cues for error awareness/correction and use of word finding strategies with in cloze phrase task with 9 out 10 accuracy given min A verbal cues of three options. Pt was left in room with call bell within reach and chair alarm set. ST recommends to continue skilled ST services.      Pain Pain Assessment Pain Score: 0-No pain  Therapy/Group: Individual Therapy  Serena Petterson  [facility] 06/14/2019, 12:20  PM

## 2019-06-14 NOTE — Progress Notes (Signed)
[facility] PHYSICAL MEDICINE & REHABILITATION PROGRESS NOTE   Subjective/Complaints:   ROS: limited due to cognition, but appears to deny CP, shortness of breath, nausea, vomiting, diarrhea  Objective:   No results found. Recent Labs    06/14/19 0624  WBC 6.8  HGB 13.0  HCT 38.4*  PLT 420*   Recent Labs    06/14/19 0624  NA 138  K 3.4*  CL 103  CO2 26  GLUCOSE 158*  BUN 14  CREATININE 0.97  CALCIUM 9.0    Intake/Output Summary (Last 24 hours) at 06/14/2019 0759 Last data filed at 06/14/2019 0641 Gross per 24 hour  Intake 432 ml  Output 1075 ml  Net -643 ml     Physical Exam: Vital Signs Blood pressure 132/84, pulse 85, temperature 98.4 F (36.9 C), resp. rate 17, height 6' (1.829 m), weight 110.3 kg, SpO2 93 %.  Constitutional: No distress . Vital signs reviewed. HENT: Normocephalic.  Atraumatic. Eyes: EOMI.  No discharge. Cardiovascular: No JVD. Respiratory: Normal effort. GI: Non-distended. Musc: No edema or tenderness in extremities. Neurological: Alert  Expressive aphasia  Dysarthria  Motor: RUE 2+/5 proximal distal  RLE 3-/5 proximal to distal  LUE/LLE 5/5 proximal to distal Skin: Warm and dry.  Intact. Psychiatric: Unable to assess due to cognition  Assessment/Plan: 1. 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  Physiatrist is providing close team supervision and 24 hour management of active medical problems listed below.  Physiatrist and rehab team continue to assess barriers to discharge/monitor patient progress toward functional and medical goals  Care Tool:  Bathing    Body parts bathed by patient: Right arm, Chest, Abdomen, Front perineal area, Right upper leg, Left upper leg, Face   Body parts bathed by helper: Buttocks, Left arm, Right lower leg, Left lower leg     Bathing assist Assist Level: Moderate Assistance - Patient  50 - 74%     Upper Body Dressing/Undressing Upper body dressing   What is the patient wearing?: Pull over shirt    Upper body assist Assist Level: Moderate Assistance - Patient 50 - 74%    Lower Body Dressing/Undressing Lower body dressing      What is the patient wearing?: Incontinence brief, Pants     Lower body assist Assist for lower body dressing: Maximal Assistance - Patient 25 - 49%     Toileting Toileting    Toileting assist Assist for toileting: 2 Helpers     Transfers Chair/bed transfer  Transfers assist     Chair/bed transfer assist level: Minimal Assistance - Patient > 75%(slide board)     Locomotion Ambulation   Ambulation assist   Ambulation activity did not occur: Safety/medical concerns          Walk 10 feet activity   Assist  Walk 10 feet activity did not occur: Safety/medical concerns        Walk 50 feet activity   Assist Walk 50 feet with 2 turns activity did not occur: Safety/medical concerns         Walk 150 feet activity   Assist Walk 150 feet activity did not occur: Safety/medical concerns         Walk 10 feet on uneven surface  activity   Assist Walk 10 feet on uneven surfaces activity did not occur: Safety/medical concerns         Wheelchair     Assist Will patient  use wheelchair at discharge?: Yes(TBD) Type of Wheelchair: Manual Wheelchair activity did not occur: Safety/medical concerns(Per report )  Wheelchair assist level: Moderate Assistance - Patient 50 - 74% Max wheelchair distance: 40'    Wheelchair 50 feet with 2 turns activity    Assist        Assist Level: Moderate Assistance - Patient 50 - 74%   Wheelchair 150 feet activity     Assist          Medical Problem List and Plan: 1.Functional and cognitive deficitssecondary to left MCA infarct  Continue CIR PT, OT, speech therapy 2. Antithrombotics: -DVT/anticoagulation:Pharmaceutical:Other  (comment)--Eliquis -antiplatelet therapy: Plavix 3. Pain Management:tylenol prn 4. Mood:LCSW to follow for evaluation and support. -antipsychotic agents: N/A 5. Neuropsych: This patientis notcapable of making decisions on hisown behalf. 6. Skin/Wound Care:Routine pressure relief measures 7. Fluids/Electrolytes/Nutrition: 8. CAD: On Lipitor and Plavix --no BB due to intermittent pauses.  9. T2DM with neuropathy: Monitor BS ac/hs. Was on metformin, Glipizide and Lantus at home.   -resumed metformin bid for meal coverage, increased to 850 on 7/3.   -increase lantus to 20 on 7/2 CBG (last 3)  Recent Labs    06/13/19 1643 06/13/19 2123 06/14/19 0619  GLUCAP 142* 154* 139*   Within acceptable levels at this point avoid hypoglycemia 10.Hypoxia: Has been weaned off oxygen. Continue to monitor for recurrent fevers. 11. LUL pulmonary nodule: follow up CT in 6-12 months 12.  Right knee pain: Appears improved, symptoms mainly during weightbearing 13.  Hypokalemia  Potassium 3.4 on 7/3  Labs ordered for tomorrow 14.  Acute blood loss anemia  Hemoglobin 12.8 on 6/30  Continue to monitor 15.  History of hypertension Vitals:   06/14/19 0313 06/14/19 0313  BP: 132/84   Pulse: 86 85  Resp: 17   Temp: 98.4 F (36.9 C)   SpO2: 92% 93%    LOS: 7 days A FACE TO FACE EVALUATION WAS PERFORMED  Charlett Blake 06/14/2019, 7:59 AM

## 2019-06-15 ENCOUNTER — Other Ambulatory Visit: Payer: Self-pay

## 2019-06-15 ENCOUNTER — Inpatient Hospital Stay (HOSPITAL_COMMUNITY): Payer: Medicare HMO

## 2019-06-15 ENCOUNTER — Inpatient Hospital Stay (HOSPITAL_COMMUNITY): Payer: Medicare HMO | Admitting: Occupational Therapy

## 2019-06-15 LAB — GLUCOSE, CAPILLARY
Glucose-Capillary: 128 mg/dL — ABNORMAL HIGH (ref 70–99)
Glucose-Capillary: 160 mg/dL — ABNORMAL HIGH (ref 70–99)
Glucose-Capillary: 130 mg/dL — ABNORMAL HIGH (ref 70–99)
Glucose-Capillary: 131 mg/dL — ABNORMAL HIGH (ref 70–99)

## 2019-06-15 MED ORDER — ENSURE ENLIVE PO LIQD
237.0000 mL | Freq: Three times a day (TID) | ORAL | Status: DC
  Administered 2019-06-15 – 2019-07-01 (×21): 237 mL via ORAL

## 2019-06-15 MED ORDER — GLUCERNA SHAKE PO LIQD: 237.0000 mL | Freq: Three times a day (TID) | ORAL | Status: DC

## 2019-06-15 MED ORDER — ACETAMINOPHEN 325 MG PO TABS: 325.0000 mg | ORAL_TABLET | ORAL | Status: AC | PRN

## 2019-06-15 MED ORDER — POTASSIUM CHLORIDE CRYS ER 10 MEQ PO TBCR
10.0000 meq | EXTENDED_RELEASE_TABLET | Freq: Two times a day (BID) | ORAL | Status: AC
  Administered 2019-06-15 – 2019-06-16 (×4): 10 meq via ORAL
  Filled 2019-06-15 (×3): qty 1

## 2019-06-15 MED ORDER — GLUCERNA 1.2 CAL PO LIQD: 1000.0000 mL | ORAL | Status: DC

## 2019-06-15 MED ORDER — MAGIC MOUTHWASH W/LIDOCAINE
10.0000 mL | Freq: Four times a day (QID) | ORAL | Status: DC
  Administered 2019-06-15 – 2019-07-02 (×55): 10 mL via ORAL
  Filled 2019-06-15 (×59): qty 10

## 2019-06-15 NOTE — Progress Notes (Signed)
Speech Language Pathology Weekly Progress and Session Note  Patient Details  Name: Navdeep Halt MRN: [id number] Date of Birth: 09/29/1941  Beginning of progress report period: June 07, 2019 End of progress report period: June 15, 2019  Today's Date: 06/15/2019 SLP Individual Time: 6837-2902 SLP Individual Time Calculation (min): 45 min  Short Term Goals: Week 1: SLP Short Term Goal 1 (Week 1): Pt will construct noun + verb utterances when describing pictures with 80% accuracy and multimodal cues. SLP Short Term Goal 1 - Progress (Week 1): Met SLP Short Term Goal 2 (Week 1): Pt will respond to questions with a clear/intelligible YES/NO on 8/10 opportunities. SLP Short Term Goal 2 - Progress (Week 1): Not met SLP Short Term Goal 3 (Week 1): Pt will listen to short, simple paragraphs and respond to yes/no questions with 80% accuracy. SLP Short Term Goal 3 - Progress (Week 1): Not met SLP Short Term Goal 4 (Week 1): Pt will attend to right side of mouth and clear oral residue with lingual sweep on 5/7 opportunities independently. SLP Short Term Goal 4 - Progress (Week 1): Not met    New Short Term Goals: Week 2: SLP Short Term Goal 1 (Week 2): Pt will construct at least a 3 word phrase when describing pictures with 80% accuracy with min A multimodal cues. SLP Short Term Goal 2 (Week 2): Pt will attend to right side of mouth and clear oral residue with lingual sweep on 5/7 opportunities independently. SLP Short Term Goal 3 (Week 2): Pt will respond to questions with a clear/intelligible YES/NO on 8/10 opportunities with min A verbal cues for clarification. SLP Short Term Goal 4 (Week 2): Pt will listen to short, simple paragraphs and respond to yes/no questions with 80% accuracy. SLP Short Term Goal 5 (Week 2): Pt will demonstrate reading comprehension at phrase level in 9/10 opportunties with supervision A multimodal cues. SLP Short Term Goal 6 (Week 2): Pt will utilize word finding strategies  with cloze phrases level in 8/10 opportunies with supervision A multimodal cues.  Weekly Progress Updates:Pt met 1 out 4 goals this reporting period. Pt demonstrates near 80% accuracy in response to yes/no question following short paragraphs and in response to general yes/no questions. Pt continues to demonstrate difficultly responding in only yes/no questions and requires moderate clarification for clear yes/no especially in functional situtations, visual aid for yes/no has been provided to aid in clarification. Pt demonstrates ability to produce structured noun + verb phrases and spontaneous phrase utterances to express wants/needs with mod A verbal cues to clarify message. Pt has developed sores on right buccal cavity and right lip, likley from biting due to reduced awareness of right side, resulting in limited PO intake. Nursing has been made awareness and diet has been downgraded to dys 2 at this time to allow sores to heal and encourage PO intake. Pt would benefit from skilled ST services in order to maximize functional independence and reduce burden of care, likely requiring 24 hour supervision and continue ST services.     Intensity: Minumum of 1-2 x/day, 30 to 90 minutes Frequency: 3 to 5 out of 7 days Duration/Length of Stay: 7/24 Treatment/Interventions: Dysphagia/aspiration precaution training;Environmental controls;Speech/Language facilitation;Internal/external aids;Patient/family education;Functional tasks   Daily Session  Skilled Therapeutic Interventions:  Skilled ST services focused on swallow and language skills. Pt refused to consume breakfast upon entering room, SLP and Nurse investigated oral cavity and found painful sores on pt's right cheek and lip. SLP downgraded diet to dys 2  to encourage PO consumption and reduce pain and magic mouth wash was applied by nurse. Pt demonstrated ability to respond accurately to yes/no questions following short paragraphs with an average of 76%  accuracy. Pt was left in room with call bell within reach and bed alarm set. ST recommends to continue skilled ST services.      General    Pain Pain Assessment Pain Score: Asleep Faces Pain Scale: Hurts even more Pain Type: Acute pain Pain Location: Leg Pain Orientation: Left Pain Descriptors / Indicators: Aching;Discomfort Pain Intervention(s): Medication (See eMAR)  Therapy/Group: Individual Therapy  Braydn Carneiro  [facility] 06/15/2019, 3:09 PM

## 2019-06-15 NOTE — Progress Notes (Signed)
Physical Therapy Weekly Progress Note  Patient Details  Name: Jobanny Mavis MRN: [id number] Date of Birth: 04/23/1941  Beginning of progress report period: June 08, 2019 End of progress report period: June 15, 2019  Today's Date: 06/15/2019 PT Individual Time: 1245-8099 PT Individual Time Calculation (min): 56 min  Missed time: 15 minutes (fatigue/pain)  Patient has met 3 of 3 short term goals.  Pt is progressing with functional mobility however unable to initiate gait training secondary to knee pain and R hip adductor tone/hip tightness. During pre-gait activities pt scissors with R LE during swing making it unsafe to ambulate. Pt is able to perform sit<>stand with min assist but can require increased assist based on fatigue and pain.   Patient continues to demonstrate the following deficits muscle weakness and muscle joint tightness, abnormal tone and decreased coordination and decreased standing balance, decreased postural control, hemiplegia and decreased balance strategies and therefore will continue to benefit from skilled PT intervention to increase functional independence with mobility.  Patient progressing toward long term goals..  Continue plan of care.  PT Short Term Goals Week 1:  PT Short Term Goal 1 (Week 1): Pt will perform supine<>sit with max assist of 1 PT Short Term Goal 1 - Progress (Week 1): Met PT Short Term Goal 2 (Week 1): Pt will perform bed<>chair transfers with max assist of 1 PT Short Term Goal 2 - Progress (Week 1): Met PT Short Term Goal 3 (Week 1): Pt will perform sit<>stand with max assist of 1 using LRAD PT Short Term Goal 3 - Progress (Week 1): Met Week 2:  PT Short Term Goal 1 (Week 2): Pt will perform transfer from bed<>chair with mod assist PT Short Term Goal 2 (Week 2): Pt will initiate gait training PT Short Term Goal 3 (Week 2): Pt will perform bed mobility with min assist  Skilled Therapeutic Interventions/Progress Updates:  Ambulation/gait  training;Community reintegration;DME/adaptive equipment instruction;Neuromuscular re-education;Psychosocial support;Stair training;UE/LE Strength taining/ROM;Wheelchair propulsion/positioning;Balance/vestibular training;Functional electrical stimulation;Discharge planning;Pain management;Skin care/wound management;Therapeutic Activities;UE/LE Coordination activities;Cognitive remediation/compensation;Disease management/prevention;Functional mobility training;Patient/family education;Splinting/orthotics;Therapeutic Exercise;Visual/perceptual remediation/compensation   Pt supine in bed upon PT arrival, pt rolls his eyes and says "no, no, no therapy" as therapist walks into the room. Therapist encouraged pt for participation and OOB activity, pt agreeable. Therapist performed R LE PROM/stretching to hip musculature, pt with very limited hip internal rotation and adductor tone noted. Pt reports L knee pain with flexion, pt unable to flex L knee past 40 degrees and is tender to palpation along L lateral knee joint line. Pt transferred to sitting EOB with mod assist and cues for techniques. Pt performed squat pivot to w/c with mod assist and transported to the gym. Pt performed slideboard transfer to the mat with min assist and cues for techniques, therapist facilitating R hand placement and weightbearing. Pt continues to grimace and groan, pt reports "I have been in pain for hours," pt unable to rate. Pt performed sit<>stand from elevated mat with RW and min assist, cues for techniques and assist with R hand placement on hand orthosis on RW. Pt performed sit<>stand from elevated mat with RW and min assist, maintained standing balance with min assist while performing horseshoe toss activity x 2 trials with cues for increased hip/trunk extension. Pt performed stand pivot back to w/c with mod assist and cues for techniques/RW management. Pt requesting to go back to room, repeats "I'm done." Pt transported back to room and  left in w/c with needs in reach and chair alarm set. Pt  missed 15 minutes of skilled therapy tx secondary to fatigue and pain.   Therapy Documentation Precautions:  Precautions Precautions: Fall Precaution Comments: R side inattention Restrictions Weight Bearing Restrictions: No  Therapy/Group: Individual Therapy  Netta Corrigan, PT, DPT 06/15/2019, 7:59 AM

## 2019-06-15 NOTE — Progress Notes (Signed)
Occupational Therapy Weekly Progress Note  Patient Details  Name: Luke Rivera MRN: [id number] Date of Birth: [birth year]  Beginning of progress report period: June 08, 2019 End of progress report period: July 16, 2019  Today's Date: 06/15/2019 OT Individual Time: 0945-1100 OT Individual Time Calculation (min): 75 min    Patient has met 4 of 5 short term goals.  Pt has progressed with his transfers to squat pivot with mod A, but there have not been enough opportunities for him to try those transfers to the toilet without the use of the stedy.  Pt has progressed with active use of his RUE/RLE and balance.  He continues to try to use his R hand with tasks but often needs to cues during the task as he demonstrated inattention to that arm.  Pt often gets frustrated with his communication but he continues to follow basic directions and puts in good effort.  Patient continues to demonstrate the following deficits: muscle weakness, decreased cardiorespiratoy endurance, abnormal tone, unbalanced muscle activation and decreased coordination, decreased attention to right, decreased awareness, decreased problem solving and decreased memory and decreased standing balance, decreased postural control, hemiplegia and decreased balance strategies and therefore will continue to benefit from skilled OT intervention to enhance overall performance with BADL and Reduce care partner burden.  Patient progressing toward long term goals..  Continue plan of care.  OT Short Term Goals Week 1:  OT Short Term Goal 1 (Week 1): Pt will be able to sit to stand with mod A of 1 to prepare for toileting tasks. OT Short Term Goal 1 - Progress (Week 1): Met OT Short Term Goal 2 (Week 1): Pt will be able to transfer to [facility] and/ or toilet with mod A of 1. OT Short Term Goal 2 - Progress (Week 1): Progressing toward goal OT Short Term Goal 3 (Week 1): Pt will be able to don shirt with min A. OT Short Term Goal 3 - Progress (Week 1):  Met OT Short Term Goal 4 (Week 1): Pt will use RUE with mod A to bathe L arm. OT Short Term Goal 4 - Progress (Week 1): Met OT Short Term Goal 5 (Week 1): Pt will don pants with max A. OT Short Term Goal 5 - Progress (Week 1): Met Week 2:  OT Short Term Goal 1 (Week 2): Pt will be able to consistently transfer to toilet with mod A of 1. OT Short Term Goal 2 (Week 2): Pt will be able to sit to stand with grab bar with min A during toileting/ LB dressing. OT Short Term Goal 3 (Week 2): Pt will demonstrate improved RUE awareness during UB dressing to fully don shirt over R arm with only min verbal/ visual cues vs. mod VC. OT Short Term Goal 4 (Week 2): Pt will be able to actively hold wash cloth in R hand to wash R thigh. OT Short Term Goal 5 (Week 2): Pt will be able to maintain static stand for 1 minute versus 20 - 30 seconds during LB self care.  Skilled Therapeutic Interventions/Progress Updates:    Pt seen for ADL training of grooming, shower, dressing: ADL: ADL Grooming: Setup Upper Body Bathing: Minimal assistance Where Assessed-Upper Body Bathing: Shower Lower Body Bathing: Moderate assistance Where Assessed-Lower Body Bathing: Shower Upper Body Dressing: Minimal assistance Where Assessed-Upper Body Dressing: Wheelchair Lower Body Dressing: Maximal assistance Where Assessed-Lower Body Dressing: Teaching laboratory technician: Moderate assistance Social research officer, government Method: Education officer, environmental: Grab bars,  Transfer tub bench  Pt demonstrated improved grasp strength, effort with his transfers and sit to stand, and attention to R side in bathing.  He has increased RUE tone in elbow flexors, shoulder.  Worked on passive stretching of arm with full PROM achieved. Continues to have smooth gliding of scapula.   Applied Saebo Stim Go to R deltoids at end of session.  Removed an hour later.    Pt resting in w/c with belt alarm on and RUE resting on a tray table.   Encouraged pt to practice table top slides of elb flex/ext.       Therapy Documentation Precautions:  Precautions Precautions: Fall Precaution Comments: R side inattention Restrictions Weight Bearing Restrictions: No      Pain: Pain Assessment Pain Scale: 0-10 Pain Score: 0-No pain  Therapy/Group: Individual Therapy  [name] 06/15/2019, 12:39 PM

## 2019-06-15 NOTE — Progress Notes (Signed)
Nutrition Follow-up  RD working remotely.  DOCUMENTATION CODES:   Obesity unspecified, suspect some degree of malnutrition is present but unable to confirm without NFPE  INTERVENTION:   Recommend Cortrak NG tube placement and initiation of nocturnal tube feeds given continued poor PO intake and significant weight loss. Cortrak team is available on Mondays and Fridays. Pt can also have Cortrak placed by diagnostic radiology.  Recommend: - Glucerna 1.5 @ 100 ml/hr to run over 10 hours from 2000 to 0600 (total of 1000 ml) - Pro-stat 30 ml BID  Recommended nocturnal tube feeding regimen provides 1700 kcal, 112 grams of protein, and 759 ml of H2O (89% of kcal needs, 102% of protein needs).  - Ensure Enlive po TID, each supplement provides 350 kcal and 20 grams of protein  - Continue Magic Cup BID with lunch and dinner meals, each supplement provides 290 kcal and 9 grams of protein  - Continue MVI with minerals daily  - Encourage adequate PO intake and provide feeding assistance as needed  - d/c Glucerna Shake  NUTRITION DIAGNOSIS:   Inadequate oral intake related to dysphagia as evidenced by meal completion < 25%.  Ongoing  GOAL:   Patient will meet greater than or equal to 90% of their needs  Unmet  MONITOR:   PO intake, Supplement acceptance, Diet advancement, Weight trends, Labs  REASON FOR ASSESSMENT:   Consult Poor PO  ASSESSMENT:   78 year old male with PMH of CAD/CAF, DVT, TIA, multiple back surgeries with limited mobility, STM deficits, and T2DM with peripheral neuropathy. Pt was admitted on 06/22 with garbled speech and right-sided weakness. 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 2D echo showed EF 55-60 % without wall abnormalities. Pt admitted to CIR on 6/29.  Noted target d/c date of 7/24.  Pt with continued poor PO intake. Noted pt has experienced a 13 lb (5.1%) weight loss since admission to CIR on 6/29. This is severe and significant for timeframe.  Spoke with pt's RN via phone call. RN reports finding multiple sores in pt's mouth today and that Magic Mouthwash has been ordered. RN is hopeful that this will help with pt's pain with eating. RN reports pt refused oral nutrition supplement that was just offered to him.  RD will switch oral nutrition supplement from Ensure Enlive to Glucerna given poor PO intake.  Discussed recommendation for a Cortrak feeding tube with MD who asked RD to reach out to PA. Secure messaged recommendation to Reesa Chew, [place]. RD will leave tube feeding recommendations.  Pt is at risk for malnutrition and may already meet the criteria for malnutrition. RD will complete NFPE on follow-up.  Reviewed RN edema assessment. Pt with nonpitting edema to RUE and mild pitting edema to BLE.  Meal Completion: 0-50% x last 8 meals (averaging 13%)  Medications reviewed and include: Glucerna Shake TID, SSI, Lantus 20 units daily, Magic Mouthwash, Metformin, liquid MVI, Protonix, K-dur 10 mEq BID  Labs reviewed: potassium 3.4 CBG's: 128-186 x 24 hours  NUTRITION - FOCUSED PHYSICAL EXAM:  Unable to complete at this time. RD working remotely. Suspect some degree of malnutrition is present.  Diet Order:   Diet Order  DIET DYS 2 Room service appropriate? Yes; Fluid consistency: Thin  Diet effective now              EDUCATION NEEDS:   Not appropriate for education at this time  Skin:  Skin Assessment: Reviewed RN Assessment (MASD to bilateral buttocks, bilateral groin)  Last BM:  06/15/19 type 7  Height:   Ht Readings from Last 1 Encounters:  06/07/19 6' (1.829 m)     Weight:   Wt Readings from Last 1 Encounters:  06/14/19 110.3 kg    Ideal Body Weight:  80.9 kg  BMI:  Body mass index is 32.98 kg/m.  Estimated Nutritional Needs:   Kcal:  1900-2100  Protein:  95-110 grams  Fluid:  >/= 1.9 L    Gaynell Face, MS, RD, LDN Inpatient Clinical Dietitian Pager: [phone] Weekend/After Hours: [phone]

## 2019-06-15 NOTE — Progress Notes (Signed)
[facility] PHYSICAL MEDICINE & REHABILITATION PROGRESS NOTE   Subjective/Complaints: Remains aphasic sparse verbalization, has difficulty answering yes/no questions  ROS: limited due to aphasia  Objective:   No results found. Recent Labs    06/14/19 0624  WBC 6.8  HGB 13.0  HCT 38.4*  PLT 420*   Recent Labs    06/14/19 0624  NA 138  K 3.4*  CL 103  CO2 26  GLUCOSE 158*  BUN 14  CREATININE 0.97  CALCIUM 9.0    Intake/Output Summary (Last 24 hours) at 06/15/2019 0817 Last data filed at 06/15/2019 0706 Gross per 24 hour  Intake 120 ml  Output 450 ml  Net -330 ml     Physical Exam: Vital Signs Blood pressure (!) 149/73, pulse 77, temperature 97.8 F (36.6 C), temperature source Oral, resp. rate 20, height 6' (1.829 m), weight 110.3 kg, SpO2 97 %.  Constitutional: No distress . Vital signs reviewed. HENT: Normocephalic.  Atraumatic. Eyes: EOMI.  No discharge. Cardiovascular: No JVD. Respiratory: Normal effort. GI: Non-distended. Musc: No edema or tenderness in extremities. Neurological: Alert  Expressive aphasia  Dysarthria  Motor: RUE 2+/5 proximal distal  RLE 3-/5 proximal to distal  LUE/LLE 5/5 proximal to distal unchanged Skin: Warm and dry.  Intact. Psychiatric: Unable to assess due to cognition  Assessment/Plan: 1. 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  Physiatrist is providing close team supervision and 24 hour management of active medical problems listed below.  Physiatrist and rehab team continue to assess barriers to discharge/monitor patient progress toward functional and medical goals  Care Tool:  Bathing    Body parts bathed by patient: Right arm, Chest, Abdomen, Front perineal area, Right upper leg, Left upper leg, Face   Body parts bathed by helper: Buttocks, Left arm, Right lower leg, Left lower leg     Bathing assist  Assist Level: Moderate Assistance - Patient 50 - 74%     Upper Body Dressing/Undressing Upper body dressing   What is the patient wearing?: Pull over shirt    Upper body assist Assist Level: Minimal Assistance - Patient > 75%    Lower Body Dressing/Undressing Lower body dressing      What is the patient wearing?: Incontinence brief, Pants     Lower body assist Assist for lower body dressing: Moderate Assistance - Patient 50 - 74%     Toileting Toileting    Toileting assist Assist for toileting: 2 Helpers     Transfers Chair/bed transfer  Transfers assist     Chair/bed transfer assist level: Minimal Assistance - Patient > 75%(slideboard)     Locomotion Ambulation   Ambulation assist   Ambulation activity did not occur: Safety/medical concerns          Walk 10 feet activity   Assist  Walk 10 feet activity did not occur: Safety/medical concerns        Walk 50 feet activity   Assist Walk 50 feet with 2 turns activity did not occur: Safety/medical concerns         Walk 150 feet activity   Assist Walk 150 feet activity did not occur: Safety/medical concerns         Walk 10 feet on uneven surface  activity   Assist Walk 10 feet on uneven surfaces activity did not occur: Safety/medical concerns         Wheelchair     Assist Will patient  use wheelchair at discharge?: Yes(TBD) Type of Wheelchair: Manual Wheelchair activity did not occur: Safety/medical concerns(Per report )  Wheelchair assist level: Moderate Assistance - Patient 50 - 74% Max wheelchair distance: 54'    Wheelchair 50 feet with 2 turns activity    Assist        Assist Level: Moderate Assistance - Patient 50 - 74%   Wheelchair 150 feet activity     Assist          Medical Problem List and Plan: 1.Functional and cognitive deficitssecondary to left MCA infarct  Continue CIR PT, OT, speech therapy Team conference in a.m. 2.  Antithrombotics: -DVT/anticoagulation:Pharmaceutical:Other (comment)--Eliquis -antiplatelet therapy: Plavix 3. Pain Management:tylenol prn 4. Mood:LCSW to follow for evaluation and support. -antipsychotic agents: N/A 5. Neuropsych: This patientis notcapable of making decisions on hisown behalf. 6. Skin/Wound Care:Routine pressure relief measures 7. Fluids/Electrolytes/Nutrition: 8. CAD: On Lipitor and Plavix --no BB due to intermittent pauses.  9. T2DM with neuropathy: Monitor BS ac/hs. Was on metformin, Glipizide and Lantus at home.   -resumed metformin bid for meal coverage, increased to 850 on 7/3.   -increase lantus to 20 on 7/2 CBG (last 3)  Recent Labs    06/14/19 1652 06/14/19 2120 06/15/19 0605  GLUCAP 186* 140* 128*   Within acceptable levels at this point avoid hypoglycemia 10.Hypoxia: Has been weaned off oxygen. Continue to monitor for recurrent fevers. 11. LUL pulmonary nodule: follow up CT in 6-12 months 12.  Right knee pain: Appears improved, symptoms mainly during weightbearing 13.  Hypokalemia  Potassium 3.4 on 7/3  Labs ordered for tomorrow 14.  Acute blood loss anemia  Hemoglobin 12.8 on 6/30  Continue to monitor 15.  History of hypertension Vitals:   06/14/19 2007 06/15/19 0559  BP: 127/71 (!) 149/73  Pulse: 88 77  Resp: 20 20  Temp: 98.1 F (36.7 C) 97.8 F (36.6 C)  SpO2: 95% 97%  Has been controlled although elevated slightly this morning we will continue to monitor  LOS: 8 days A FACE TO FACE EVALUATION WAS PERFORMED  Charlett Blake 06/15/2019, 8:17 AM

## 2019-06-15 NOTE — Progress Notes (Signed)
Recommendations of tube feeds discussed with wife and patient together via phone---he was resistant at first but then was agreeable. Wife would like to discuss food choices with RD to help with intake. Will ask secure message her.

## 2019-06-16 ENCOUNTER — Inpatient Hospital Stay (HOSPITAL_COMMUNITY): Payer: Medicare HMO

## 2019-06-16 ENCOUNTER — Inpatient Hospital Stay (HOSPITAL_COMMUNITY): Payer: Medicare HMO | Admitting: Occupational Therapy

## 2019-06-16 LAB — GLUCOSE, CAPILLARY
Glucose-Capillary: 125 mg/dL — ABNORMAL HIGH (ref 70–99)
Glucose-Capillary: 194 mg/dL — ABNORMAL HIGH (ref 70–99)
Glucose-Capillary: 114 mg/dL — ABNORMAL HIGH (ref 70–99)
Glucose-Capillary: 134 mg/dL — ABNORMAL HIGH (ref 70–99)

## 2019-06-16 MED ORDER — LIDOCAINE VISCOUS HCL 2 % MT SOLN
OROMUCOSAL | Status: AC
  Filled 2019-06-16: qty 15

## 2019-06-16 NOTE — Progress Notes (Signed)
[facility] PHYSICAL MEDICINE & REHABILITATION PROGRESS NOTE   Subjective/Complaints: Poor appetite, dietary recommending feeding tube.  Wife is in agreement however patient refuses.  According to wife patient likes to eat junk food.  He has been getting a tray from dietary but has not picked out his own food because of his aphasia.  ROS: limited due to aphasia  Objective:   No results found. Recent Labs    06/14/19 0624  WBC 6.8  HGB 13.0  HCT 38.4*  PLT 420*   Recent Labs    06/14/19 0624  NA 138  K 3.4*  CL 103  CO2 26  GLUCOSE 158*  BUN 14  CREATININE 0.97  CALCIUM 9.0    Intake/Output Summary (Last 24 hours) at 06/16/2019 1028 Last data filed at 06/16/2019 0817 Gross per 24 hour  Intake 240 ml  Output 700 ml  Net -460 ml     Physical Exam: Vital Signs Blood pressure (!) 129/93, pulse 86, temperature (!) 97.5 F (36.4 C), resp. rate 19, height 6' (1.829 m), weight 112.4 kg, SpO2 95 %.  Constitutional: No distress . Vital signs reviewed. HENT: Normocephalic.  Atraumatic. Eyes: EOMI.  No discharge. Cardiovascular: No JVD. Respiratory: Normal effort. GI: Non-distended. Musc: No edema or tenderness in extremities. Neurological: Alert  Expressive aphasia, inaccurate with yes no responses tends to state no to all questions Dysarthria  Motor: RUE 2+/5 proximal distal  RLE 3-/5 proximal to distal  LUE/LLE 5/5 proximal to distal unchanged Skin: Warm and dry.  Intact. Psychiatric: Unable to assess due to cognition  Assessment/Plan: 1. 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  Physiatrist is providing close team supervision and 24 hour management of active medical problems listed below.  Physiatrist and rehab team continue to assess barriers to discharge/monitor patient progress toward functional and medical goals  Care Tool:  Bathing    Body  parts bathed by patient: Right arm, Chest, Abdomen(ub only today)   Body parts bathed by helper: Left arm     Bathing assist Assist Level: Moderate Assistance - Patient 50 - 74%     Upper Body Dressing/Undressing Upper body dressing   What is the patient wearing?: Pull over shirt    Upper body assist Assist Level: Contact Guard/Touching assist    Lower Body Dressing/Undressing Lower body dressing      What is the patient wearing?: Incontinence brief, Pants     Lower body assist Assist for lower body dressing: Moderate Assistance - Patient 50 - 74%     Toileting Toileting    Toileting assist Assist for toileting: 2 Helpers     Transfers Chair/bed transfer  Transfers assist     Chair/bed transfer assist level: Moderate Assistance - Patient 50 - 74%     Locomotion Ambulation   Ambulation assist   Ambulation activity did not occur: Safety/medical concerns          Walk 10 feet activity   Assist  Walk 10 feet activity did not occur: Safety/medical concerns        Walk 50 feet activity   Assist Walk 50 feet with 2 turns activity did not occur: Safety/medical concerns         Walk 150 feet activity   Assist Walk 150 feet activity did not occur: Safety/medical concerns         Walk 10 feet on uneven surface  activity   Assist  Walk 10 feet on uneven surfaces activity did not occur: Safety/medical concerns         Wheelchair     Assist Will patient use wheelchair at discharge?: Yes(TBD) Type of Wheelchair: Manual Wheelchair activity did not occur: Safety/medical concerns(Per report )  Wheelchair assist level: Moderate Assistance - Patient 50 - 74% Max wheelchair distance: 28'    Wheelchair 50 feet with 2 turns activity    Assist        Assist Level: Moderate Assistance - Patient 50 - 74%   Wheelchair 150 feet activity     Assist          Medical Problem List and Plan: 1.Functional and cognitive  deficitssecondary to left MCA infarct  Continue CIR PT, OT, speech therapy Team conference today please see physician documentation under team conference tab, met with team face-to-face to discuss problems,progress, and goals. Formulized individual treatment plan based on medical history, underlying problem and comorbidities. 2. Antithrombotics: -DVT/anticoagulation:Pharmaceutical:Other (comment)--Eliquis -antiplatelet therapy: Plavix 3. Pain Management:tylenol prn 4. Mood:LCSW to follow for evaluation and support. -antipsychotic agents: N/A 5. Neuropsych: This patientis notcapable of making decisions on hisown behalf. 6. Skin/Wound Care:Routine pressure relief measures 7. Fluids/Electrolytes/Nutrition: 8. CAD: On Lipitor and Plavix --no BB due to intermittent pauses.  9. T2DM with neuropathy: Monitor BS ac/hs. Was on metformin, Glipizide and Lantus at home.   -resumed metformin bid for meal coverage, increased to 850 on 7/3.   -increase lantus to 20 on 7/2 CBG (last 3)  Recent Labs    06/15/19 1650 06/15/19 2137 06/16/19 0645  GLUCAP 130* 131* 125*   Controlled, 7/8 10.Hypoxia: Has been weaned off oxygen. Continue to monitor for recurrent fevers. 11. LUL pulmonary nodule: follow up CT in 6-12 months 12.  Right knee pain: Appears improved, symptoms mainly during weightbearing 13.  Hypokalemia  Potassium 3.4 on 7/3  Labs ordered for tomorrow 14.  Acute blood loss anemia  Hemoglobin 12.8 on 6/30  Continue to monitor 15.  History of hypertension Vitals:   06/15/19 2057 06/16/19 0505  BP: 128/66 (!) 129/93  Pulse: 81 86  Resp: 20 19  Temp: 97.7 F (36.5 C) (!) 97.5 F (36.4 C)  SpO2: 94% 95%  Controlled, 7/8 mild elevation of diastolic  LOS: 9 days A FACE TO FACE EVALUATION WAS PERFORMED  Charlett Blake 06/16/2019, 10:28 AM

## 2019-06-16 NOTE — Progress Notes (Signed)
Physical Therapy Session Note  Patient Details  Name: Luke Rivera MRN: [id number] Date of Birth: [birth year]  Today's Date: 06/16/2019 PT Individual Time: 1000-1057 PT Individual Time Calculation (min): 57 min   Short Term Goals: Week 2:  PT Short Term Goal 1 (Week 2): Pt will perform transfer from bed<>chair with mod assist PT Short Term Goal 2 (Week 2): Pt will initiate gait training PT Short Term Goal 3 (Week 2): Pt will perform bed mobility with min assist  Skilled Therapeutic Interventions/Progress Updates:    Pt seated in w/c upon PT arrival, agreeable to therapy tx and denies pain. Pt transported to the gym. Pt performed sit<>stands with RW this session with mod assist. Pt ambulated x 8 ft and x 15 ft this session with RW and mod assist, occasional assist for R LE advancement, shoe cover added for foot clearance, R LE adduction noted secondary to tone. Pt seated in w/c while therapist performed R UE PROM and stretching for tone management. Pt performed stand pivot to the mat with RW and mod assist, therapist assisting with R LE placement. Pt reports having to use bathroom. Squat pivot to w/c with min assist and transported back to room. Pt performed transfer from w/c>toilet this session with use of stedy, min-mod assist for sit to stands while therapist performed clothing management and pericare, continent of bowel and bladder. Pt transferred from toilet>bed with stedy, left supine with needs in reach and bed alarm set.   Therapy Documentation Precautions:  Precautions Precautions: Fall Precaution Comments: R side inattention Restrictions Weight Bearing Restrictions: No   Therapy/Group: Individual Therapy  Netta Corrigan, PT, DPT 06/16/2019, 7:38 AM

## 2019-06-16 NOTE — Patient Care Conference (Signed)
Inpatient RehabilitationTeam Conference and Plan of Care Update Date: 06/16/2019   Time: 11:31 AM    Patient Name: Luke Rivera      Medical Record Number: [id number]  Date of Birth: 02/20/1941 Sex: Male         Room/Bed: 4W13C/4W13C-01 Payor Info: Payor: [employer] MEDICARE / Plan: [employer] MEDICARE HMO/PPO / Product Type: *No Product type* /    Admitting Diagnosis: 4. CVA 2 Team LT. CVA; 22-24days  Admit Date/Time:  06/07/2019  3:16 PM Admission Comments: No comment available   Primary Diagnosis:  <principal problem not specified> Principal Problem: <principal problem not specified>  Patient Active Problem List   Diagnosis Date Noted  . Labile blood pressure   . Diabetes mellitus type 2 in obese ([place])   . Acute blood loss anemia   . Hypokalemia   . Diabetic peripheral neuropathy ([place])   . Right knee pain   . Acute ischemic right MCA stroke ([place]) 06/07/2019  . AMS (altered mental status)   . Incidental pulmonary nodule, > 78mm and < 35mm 06/01/2019  . CVA (cerebral vascular accident) ([place]) 05/31/2019  . 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 Onychomycosis 01/11/2015  . Pain in lower limb 01/11/2015  . Hammer toe of right foot 01/02/2015  . Pain in right foot 01/02/2015    Expected Discharge Date: Expected Discharge Date: 07/02/19  Team Members Present: Physician leading conference: Dr. Alysia Penna Social Worker Present: Ovidio Kin, LCSW Nurse Present: Blair Heys, RN PT Present: Michaelene Song, PT OT Present: Willeen Cass, OT SLP Present: Charolett Bumpers, SLP PPS Coordinator present : Gunnar Fusi, SLP     Current Status/Progress Goal Weekly Team Focus  Medical   Poor appetite, may be related to food choices.  Remains severely aphasic.  Maintain medical stability ensure proper nutrition.  Increase accuracy of yes no basic questions  Address nutritional issues   Bowel/Bladder   incontinent of B/B occasional periods of continence if staff with pt  and prompting him  LBM 07/07  regain continence of B/B regular bowel pattern  timed toileting Q2H laxatives prn   Swallow/Nutrition/ Hydration   dys 2 and thin, due to sores/bitting on right cheek/lip  Supervision  swallow strategies to clear right side   ADL's   mod A squat pivot to tub bench with bars, mod A sit to stand with UE support on a bar, max A toilet transfer,  min A UB bathing and dressing; mod A LB bathing, max A LB dressing,  RUE developing more tone but he can now grasp well and lifts arm to 80 degrees with compensatory strategies  Min A overall  ADL training, balance, RUE NMR, perceptual awarenss, pt/fam ed   Mobility   mod assist bed mobility and slideboard transfers. mod-max assist for stand pivot with RW. min-max assist for sit<>stand  supervision bed mobility, min assist bed<>chair transfers, min assist gait 49ft with LRAD, supervision w/c mobility  transfers, gait, standing tolerance, standing balance, LE ROM and strength, education   Communication   Mod A, yes/no response, phrase level in structured tasks  Min A  expression/comprehension of yes/no, phrase level, reading phrase level, word finding phrase level   Safety/Cognition/ Behavioral Observations            Pain   pt c/o bilateral knee pain tylenol prn and Voltaren cream scheduled  pain <=3/10  assess pain qshift and prn medicate as ordered assess for relief   Skin   ecchymosis  BLE RLE eccyhmosis at ankle MASD groin buttocks  improvemement of MASD no further breakdown or infrection  assess skin qshift and prn barrier cream to buttock      *See Care Plan and progress notes for long and short-term goals.     Barriers to Discharge  Current Status/Progress Possible Resolutions Date Resolved   Physician    Medical stability     Minimal progress towards goals  Will asked that wife be allowed to stay with the patient during the day at least      Nursing                  PT                    OT                   SLP                SW                Discharge Planning/Teaching Needs:  Wife wants to take him home but wants to be able to manage his care. [facility] hours per day [facility], but wife will need to do the rest.      Team Discussion:  Progressing slowly in therapies. Goals min assist level. Poor po intake refuses Cortrak. Wife coming in for depression issues and po intake-MD feels needed throughout his stay here. B-knee pain voltaren helping. R-arm starting to move. 15 ft with RW mod assist level-today with PT. Ulcers in mouth nursing addressing. Y/N inconsistent  Revisions to Treatment Plan:  Dc 7/24    Continued Need for Acute Rehabilitation Level of Care: The patient requires daily medical management by a physician with specialized training in physical medicine and rehabilitation for the following conditions: Daily direction of a multidisciplinary physical rehabilitation program to ensure safe treatment while eliciting the highest outcome that is of practical value to the patient.: Yes Daily medical management of patient stability for increased activity during participation in an intensive rehabilitation regime.: Yes Daily analysis of laboratory values and/or radiology reports with any subsequent need for medication adjustment of medical intervention for : Neurological problems;Nutritional problems   I attest that I was present, lead the team conference, and concur with the assessment and plan of the team. Teleconference held due to COVID 19   Hovanes Hymas, Gardiner Rhyme 06/16/2019, 11:31 AM

## 2019-06-16 NOTE — Progress Notes (Addendum)
Occupational Therapy Session Note  Patient Details  Name: Luke Rivera MRN: [id number] Date of Birth: [birth year]  Today's Date: 06/16/2019 OT Individual Time: 2229-7989 OT Individual Time Calculation (min): 60 min    Short Term Goals: Week 2:  OT Short Term Goal 1 (Week 2): Pt will be able to consistently transfer to toilet with mod A of 1. OT Short Term Goal 2 (Week 2): Pt will be able to sit to stand with grab bar with min A during toileting/ LB dressing. OT Short Term Goal 3 (Week 2): Pt will demonstrate improved RUE awareness during UB dressing to fully don shirt over R arm with only min verbal/ visual cues vs. mod VC. OT Short Term Goal 4 (Week 2): Pt will be able to actively hold wash cloth in R hand to wash R thigh. OT Short Term Goal 5 (Week 2): Pt will be able to maintain static stand for 1 minute versus 20 - 30 seconds during LB self care.  Skilled Therapeutic Interventions/Progress Updates:    Pt received in bed still with the clothes on from yesterday. He stated that he had already "peed and pooped" this am and did not need to sit on the toilet. His breakfast had arrived. Pt very upset about the idea of having a G tube and kept saying "no tube".  Encouraged pt to eat his breakfast otherwise he would have to have the nutrition supplement. Pt sat to EOB using rail with min A.   Applied estim to R deltoids: Saebo Stim Go 330 pulse width 35 Hz pulse rate On 8 sec/ off 8 sec Ramp up/ down 2 sec Symmetrical Biphasic wave form  Max intensity 183m at 500 Ohm load  Pt initially took a small bite of dys 2 eggs but then coughed them up.  He took 2 bites of grits and 4 bites of applesauce. He drank all of his grape juice.  He refused more breakfast.  He only ate about 5%.  Reported this to NT.  Attempted to encourage pt for close to 30 min.    Pt was then was able to doff shirt with no cues, wash UB with min A using R arm with min A to wash L arm grasping washcloth, and visual  demonstration followed by touching A to don shirt.  Improved attention to R arm to place into sleeve.  Pt then worked on RUE AROM using a dowel bar with B hands and support to R arm for push pull and rowing exercises. Single arm push pull with good pushing strength forward.  AROM of grasp/ release of hand on bar 12x with focus on full finger extension. Continues to have flexor tone in elbow but can easily extend arm himself with cues and continues to have good PROM.  saebo stim removed after 45 min.  Pt opted to stay in bed prior to PT. Pt was able to [facility] foot [facility] lifting hips well and scooting with only CGA.   Min A to lay down.  Bed alarm set and all needs met.    Therapy Documentation Precautions:  Precautions Precautions: Fall Precaution Comments: R side inattention Restrictions Weight Bearing Restrictions: No    Vital Signs: Therapy Vitals Temp: (!) 97.5 F (36.4 C) Pulse Rate: 86 Resp: 19 BP: (!) 129/93 Patient Position (if appropriate): Lying Oxygen Therapy SpO2: 95 % O2 Device: Room Air Pain:   ADL: ADL Grooming: Setup Upper Body Bathing: Minimal assistance Where Assessed-Upper  Body Bathing: Shower Lower Body Bathing: Moderate assistance Where Assessed-Lower Body Bathing: Shower Upper Body Dressing: Minimal assistance Where Assessed-Upper Body Dressing: Wheelchair Lower Body Dressing: Maximal assistance Where Assessed-Lower Body Dressing: Wheelchair Toileting: Dependent Where Assessed-Toileting: Bedside Commode Toilet Transfer: Other (comment)(+2 stedy lift) Science writer: Geophysical data processor: Moderate assistance Social research officer, government Method: Education officer, environmental: Grab bars, Transfer tub bench   Therapy/Group: Individual Therapy  Meghna Hagmann 06/16/2019, 7:21 AM

## 2019-06-16 NOTE — Progress Notes (Signed)
Speech Language Pathology Daily Session Note  Patient Details  Name: Luke Rivera MRN: [id number] Date of Birth: 03/13/1941  Today's Date: 06/16/2019 SLP Individual Time: 9326-7124 SLP Individual Time Calculation (min): 56 min  Short Term Goals: Week 2: SLP Short Term Goal 1 (Week 2): Pt will construct at least a 3 word phrase when describing pictures with 80% accuracy with min A multimodal cues. SLP Short Term Goal 2 (Week 2): Pt will attend to right side of mouth and clear oral residue with lingual sweep on 5/7 opportunities independently. SLP Short Term Goal 3 (Week 2): Pt will respond to questions with a clear/intelligible YES/NO on 8/10 opportunities with min A verbal cues for clarification. SLP Short Term Goal 4 (Week 2): Pt will listen to short, simple paragraphs and respond to yes/no questions with 80% accuracy. SLP Short Term Goal 5 (Week 2): Pt will demonstrate reading comprehension at phrase level in 9/10 opportunties with supervision A multimodal cues. SLP Short Term Goal 6 (Week 2): Pt will utilize word finding strategies with cloze phrases level in 8/10 opportunies with supervision A multimodal cues.  Skilled Therapeutic Interventions: Skilled ST services focused on education and language skills. [name] was present for treatment session. [name] brought regular textured food from home, which is allowed to encourage PO consumption, she reported pt consumed limited amount and has refused NG tube placement. SLP provided education to pt the importance of PO consumption and need for tube placement if nuritional needs are not met.  SLP facilitated comprehension of  complex yes/no questions pt answered 6 out 10 correctly and with mod A verbal cues for clarification 8 out 10 correctly. Pt demonstrated ability to read at phrase level in 7 out 10 opportunities, with min A verbal cues in 8 out 10 opportunities and utilize word finding strategies at phrase level with cloze phrases in 8 out 10  opportunities. Pt demonstrates ability to create three word phrase to describe action based pictures in 6 out 10 opportunities, given moderate verbal cues to reorganize utterances created clear +3 word phrases in 8 out 10 opportunities. For example pt stated " my wife beautiful flowers", " drinking cup of coffee", and " blowing round balloon." SLP provided education to pt's wife pertaining to communication strategies utilizing yes/no questions when pt appeared frustrated by ineffective communication, cuing for reading at phrase level (personal mail) and encouragement for PO intake. Pt demonstrated ability to write full name with left hand. All questions were answered to satisfaction.  Pt was left in room with wife in room, call bell within reach and bed alarm set. ST recommends to continue skilled ST services.      Pain Pain Assessment Pain Score: 0-No pain  Therapy/Group: Individual Therapy  [name]  [facility] 06/16/2019, 2:32 PM

## 2019-06-16 NOTE — Progress Notes (Signed)
Social Work Patient ID: Alejandro Mulling, male   DOB: 12-Mar-1941, 78 y.o.   MRN: [id number]  Met with pt and wife who is here due to MD felt he needed her here for pt's emotional well being and to have better po intake. Pt is tearful she is here and feels better. She will participate in therapies with him today. MD feels she can be here daily and needs to be for pt's mental health. Wife is in agreement and will plan to be here. Informed then on the team conference update and progress toward his goals. Both are pleased with the progress he has made this week. Will continue to work on discharge plans.

## 2019-06-16 NOTE — Plan of Care (Signed)
  Problem: Consults Goal: RH STROKE PATIENT EDUCATION Description: See Patient Education module for education specifics  Outcome: Progressing   Problem: RH BOWEL ELIMINATION Goal: RH STG MANAGE BOWEL WITH ASSISTANCE Description: STG Manage Bowel with mod Assistance. Outcome: Progressing Flowsheets (Taken 06/16/2019 1455) STG: Pt will manage bowels with assistance: 4-Minimum assistance Goal: RH STG MANAGE BOWEL W/MEDICATION W/ASSISTANCE Description: STG Manage Bowel with Medication with mod Assistance. Outcome: Progressing Flowsheets (Taken 06/16/2019 1455) STG: Pt will manage bowels with medication with assistance: 3-Moderate assistance   Problem: RH BLADDER ELIMINATION Goal: RH STG MANAGE BLADDER WITH ASSISTANCE Description: STG Manage Bladder With min Assistance Outcome: Progressing Flowsheets (Taken 06/16/2019 1455) STG: Pt will manage bladder with assistance: 4-Minimal assistance   Problem: RH SKIN INTEGRITY Goal: RH STG MAINTAIN SKIN INTEGRITY WITH ASSISTANCE Description: STG Maintain Skin Integrity With min Assistance. Outcome: Progressing Flowsheets (Taken 06/16/2019 1455) STG: Maintain skin integrity with assistance: 3-Moderate assistance   Problem: RH SAFETY Goal: RH STG ADHERE TO SAFETY PRECAUTIONS W/ASSISTANCE/DEVICE Description: STG Adhere to Safety Precautions With min cues/reminders Assistance/Device. Outcome: Progressing Flowsheets (Taken 06/16/2019 1455) STG:Pt will adhere to safety precautions with assistance/device: 3-Moderate assistance   Problem: RH COGNITION-NURSING Goal: RH STG ANTICIPATES NEEDS/CALLS FOR ASSIST W/ASSIST/CUES Description: STG Anticipates Needs/Calls for Assist With  min cues/reminders Assistance/Cues. Outcome: Progressing Flowsheets (Taken 06/16/2019 1455) STG: Anticipates needs/calls for assistance with assistance/cues: 3-Moderate assistance   Problem: RH KNOWLEDGE DEFICIT Goal: RH STG INCREASE KNOWLEDGE OF DIABETES Description: Pt will be  able to manage DM with medications, and diet restrictions using handouts and notebooks with min assist by wife Outcome: Progressing Goal: RH STG INCREASE KNOWLEDGE OF HYPERTENSION Description: Pt will be able to manage HTN with medications, and diet restrictions using handouts and notebooks with min assist by wife Outcome: Progressing Goal: RH STG INCREASE KNOWLEDGE OF DYSPHAGIA/FLUID INTAKE Description: Pt will be able to manage dysphagia and diet restrictions using handouts and notebooks with min assist by wife Outcome: Progressing Goal: RH STG INCREASE KNOWLEGDE OF HYPERLIPIDEMIA Description: Pt will be able to manage HLD with medications, and diet restrictions using handouts and notebooks with min assist by wife Outcome: Progressing

## 2019-06-16 NOTE — Progress Notes (Addendum)
Nutrition Follow-up  DOCUMENTATION CODES:   Obesity unspecified  INTERVENTION:   - RD recommended Cortrak tube and initiation of enteral nutrition but pt has refused  - RD spoke with pt's wife over the phone and obtained food preferences which were recorded in [place]  -Continue Ensure Enlive po TID, each supplement provides 350 kcal and 20 grams of protein (strawberry flavor)  -Continue MagicCupTID withlunch and dinnermeals, each supplement provides 290 kcal and 9 grams of protein  - Continue MVI with minerals daily  - Double protein portions with all meals  - Encourage adequate PO intake and provide feeding assistance as needed  NUTRITION DIAGNOSIS:   Moderate Malnutrition related to acute illness (stroke) as evidenced by mild fat depletion, mild muscle depletion, moderate muscle depletion, energy intake < or equal to 50% for > or equal to 5 days.  New diagnosis after completion of NFPE  GOAL:   Patient will meet greater than or equal to 90% of their needs  Unmet  MONITOR:   PO intake, Supplement acceptance, Diet advancement, Weight trends, Labs  REASON FOR ASSESSMENT:   Consult Poor PO, Enteral/tube feeding initiation and management  ASSESSMENT:   78 year old male with PMH of CAD/CAF, DVT, TIA, multiple back surgeries with limited mobility, STM deficits, and T2DM with peripheral neuropathy. Pt was admitted on 06/22 with garbled speech and right-sided weakness. 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 2D echo showed EF 55-60 % without wall abnormalities. Pt admitted to CIR on 6/29.  PA discussed recommendation  for Cortrak and tube feeds with pt and wife via phone call on 7/07. At that time, pt and wife were agreeable to Cortrak placement.  As of this morning, pt is now refusing Cortrak placement. Consult to Cortrak team cancelled by PA.  RD spoke with pt's wife via phone call. Obtained pt's food preferences and recorded those that are available in [place]. Discussed Dysphagia 2 diet order with pt's wife and that SLP downgraded diet due to pt's mouth sores. Pt with aphasia and cannot participate in meal ordering.  Spoke with pt briefly at bedside. Pt lethargic but able to answer some questions. When RD asked pt how he likes the Ensure Enlive shakes, pt states "no." Pt with aphasia so most of his answers were difficult to understand.  [name] is going to bring pt some food from home to aid in increasing pt's PO intake.  Meal Completion: 0-25% x last 8 meals  Medications reviewed and include: Ensure Enlive TID, SSI, Lantus 20 units daily, magic mouthwash, Metformin 850 mg BID, liquid MVI, Protonix, K-dur 10 mEq BID  Labs reviewed. CBG's: 125-194 x 24 hours  NUTRITION - FOCUSED PHYSICAL EXAM:    Most Recent Value  Orbital Region  Mild depletion  Upper Arm Region  Mild depletion  Thoracic and Lumbar Region  Mild depletion  Buccal Region  Mild depletion  Temple Region  Mild depletion  Clavicle Bone Region  Mild depletion  Clavicle and Acromion Bone Region  Moderate depletion  Scapular Bone Region  Unable to assess  Dorsal Hand  No depletion  Patellar Region  Mild depletion  Anterior Thigh Region  Mild depletion  Posterior Calf Region  No depletion  Edema (RD Assessment)  Mild [BLE]  Hair  Reviewed  Eyes  Reviewed  Mouth  Reviewed  Skin  Reviewed  Nails  Reviewed       Diet Order:   Diet Order            DIET DYS 2 Room service appropriate? Yes; Fluid consistency: Thin  Diet effective now              EDUCATION NEEDS:   Not appropriate for education at this  time  Skin:  Skin Assessment: Reviewed RN Assessment (MASD to bilateral buttocks, bilateral groin)  Last BM:  06/15/19  Height:   Ht Readings from Last 1 Encounters:  06/16/19 6' (1.829 m)    Weight:   Wt Readings from Last 1 Encounters:  06/16/19 112.4 kg    Ideal Body Weight:  80.9 kg  BMI:  Body mass index is 33.61 kg/m.  Estimated Nutritional Needs:   Kcal:  1900-2100  Protein:  95-110 grams  Fluid:  >/= 1.9 L    Gaynell Face, MS, RD, LDN Inpatient Clinical Dietitian Pager: [phone] Weekend/After Hours: [phone]

## 2019-06-17 ENCOUNTER — Inpatient Hospital Stay (HOSPITAL_COMMUNITY): Payer: Medicare HMO | Admitting: Physical Therapy

## 2019-06-17 ENCOUNTER — Inpatient Hospital Stay (HOSPITAL_COMMUNITY): Payer: Medicare HMO | Admitting: Speech Pathology

## 2019-06-17 ENCOUNTER — Inpatient Hospital Stay (HOSPITAL_COMMUNITY): Payer: Medicare HMO | Admitting: Occupational Therapy

## 2019-06-17 LAB — GLUCOSE, CAPILLARY
Glucose-Capillary: 132 mg/dL — ABNORMAL HIGH (ref 70–99)
Glucose-Capillary: 161 mg/dL — ABNORMAL HIGH (ref 70–99)
Glucose-Capillary: 143 mg/dL — ABNORMAL HIGH (ref 70–99)
Glucose-Capillary: 148 mg/dL — ABNORMAL HIGH (ref 70–99)

## 2019-06-17 NOTE — Plan of Care (Signed)
  Problem: Consults Goal: RH STROKE PATIENT EDUCATION Description: See Patient Education module for education specifics  06/17/2019 1242 by Glean Salen, RN Outcome: Progressing 06/17/2019 1242 by Glean Salen, RN Outcome: Progressing   Problem: RH BOWEL ELIMINATION Goal: RH STG MANAGE BOWEL WITH ASSISTANCE Description: STG Manage Bowel with mod Assistance. 06/17/2019 1242 by Glean Salen, RN Outcome: Progressing 06/17/2019 1242 by Glean Salen, RN Outcome: Progressing Flowsheets (Taken 06/17/2019 1242) STG: Pt will manage bowels with assistance: 4-Minimum assistance Goal: RH STG MANAGE BOWEL W/MEDICATION W/ASSISTANCE Description: STG Manage Bowel with Medication with mod Assistance. Outcome: Progressing   Problem: RH BLADDER ELIMINATION Goal: RH STG MANAGE BLADDER WITH ASSISTANCE Description: STG Manage Bladder With min Assistance Outcome: Progressing Flowsheets (Taken 06/17/2019 1242) STG: Pt will manage bladder with assistance: 4-Minimal assistance   Problem: RH SKIN INTEGRITY Goal: RH STG MAINTAIN SKIN INTEGRITY WITH ASSISTANCE Description: STG Maintain Skin Integrity With min Assistance. Outcome: Progressing Flowsheets (Taken 06/17/2019 1242) STG: Maintain skin integrity with assistance: 3-Moderate assistance   Problem: RH SAFETY Goal: RH STG ADHERE TO SAFETY PRECAUTIONS W/ASSISTANCE/DEVICE Description: STG Adhere to Safety Precautions With min cues/reminders Assistance/Device. Outcome: Progressing Flowsheets (Taken 06/17/2019 1242) STG:Pt will adhere to safety precautions with assistance/device: 3-Moderate assistance   Problem: RH COGNITION-NURSING Goal: RH STG ANTICIPATES NEEDS/CALLS FOR ASSIST W/ASSIST/CUES Description: STG Anticipates Needs/Calls for Assist With  min cues/reminders Assistance/Cues. Outcome: Progressing Flowsheets (Taken 06/17/2019 1242) STG: Anticipates needs/calls for assistance with assistance/cues: 3-Moderate assistance   Problem: RH KNOWLEDGE  DEFICIT Goal: RH STG INCREASE KNOWLEDGE OF DIABETES Description: Pt will be able to manage DM with medications, and diet restrictions using handouts and notebooks with min assist by wife Outcome: Progressing Goal: RH STG INCREASE KNOWLEDGE OF HYPERTENSION Description: Pt will be able to manage HTN with medications, and diet restrictions using handouts and notebooks with min assist by wife Outcome: Progressing Goal: RH STG INCREASE KNOWLEDGE OF DYSPHAGIA/FLUID INTAKE Description: Pt will be able to manage dysphagia and diet restrictions using handouts and notebooks with min assist by wife Outcome: Progressing Goal: RH STG INCREASE KNOWLEGDE OF HYPERLIPIDEMIA Description: Pt will be able to manage HLD with medications, and diet restrictions using handouts and notebooks with min assist by wife Outcome: Progressing Goal: RH STG INCREASE KNOWLEDGE OF STROKE PROPHYLAXIS Description: Pt will be able to manage secondary stroke prevention with medications, and diet restrictions using handouts and notebooks with min assist by wife Outcome: Progressing

## 2019-06-17 NOTE — Progress Notes (Signed)
[facility] PHYSICAL MEDICINE & REHABILITATION PROGRESS NOTE   Subjective/Complaints: Patient reportedly ate well this morning.  He does not know if his wife is in the hospital right now.  ROS: limited due to aphasia  Objective:   No results found. No results for input(s): WBC, HGB, HCT, PLT in the last 72 hours. No results for input(s): NA, K, CL, CO2, GLUCOSE, BUN, CREATININE, CALCIUM in the last 72 hours.  Intake/Output Summary (Last 24 hours) at 06/17/2019 1038 Last data filed at 06/17/2019 1024 Gross per 24 hour  Intake 240 ml  Output 675 ml  Net -435 ml     Physical Exam: Vital Signs Blood pressure 125/73, pulse 80, temperature 97.7 F (36.5 C), temperature source Oral, resp. rate 18, height 6' (1.829 m), weight 109.6 kg, SpO2 95 %.  Constitutional: No distress . Vital signs reviewed. HENT: Normocephalic.  Atraumatic. Eyes: EOMI.  No discharge. Cardiovascular: No JVD. Respiratory: Normal effort. GI: Non-distended. Musc: No edema or tenderness in extremities. Neurological: Alert  Expressive aphasia, inaccurate with yes no responses tends to state no to all questions Dysarthria  Motor: RUE 2+/5 proximal distal  RLE 3-/5 proximal to distal  LUE/LLE 5/5 proximal to distal unchanged Skin: Warm and dry.  Intact. Psychiatric: Unable to assess due to cognition  Assessment/Plan: 1. 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  Physiatrist is providing close team supervision and 24 hour management of active medical problems listed below.  Physiatrist and rehab team continue to assess barriers to discharge/monitor patient progress toward functional and medical goals  Care Tool:  Bathing    Body parts bathed by patient: Right arm, Chest, Abdomen, Front perineal area, Right upper leg, Left upper leg, Left lower leg, Face   Body parts bathed by helper: Left arm,  Buttocks, Right lower leg     Bathing assist Assist Level: Moderate Assistance - Patient 50 - 74%     Upper Body Dressing/Undressing Upper body dressing   What is the patient wearing?: Pull over shirt    Upper body assist Assist Level: Contact Guard/Touching assist    Lower Body Dressing/Undressing Lower body dressing      What is the patient wearing?: Incontinence brief, Pants     Lower body assist Assist for lower body dressing: Moderate Assistance - Patient 50 - 74%     Toileting Toileting    Toileting assist Assist for toileting: Moderate Assistance - Patient 50 - 74%     Transfers Chair/bed transfer  Transfers assist     Chair/bed transfer assist level: Moderate Assistance - Patient 50 - 74%(stand pivot RW)     Locomotion Ambulation   Ambulation assist   Ambulation activity did not occur: Safety/medical concerns  Assist level: 2 helpers(mod assist with +2 for w/c follow) Assistive device: Walker-rolling Max distance: 15 ft   Walk 10 feet activity   Assist  Walk 10 feet activity did not occur: Safety/medical concerns  Assist level: Moderate Assistance - Patient - 50 - 74% Assistive device: Walker-rolling   Walk 50 feet activity   Assist Walk 50 feet with 2 turns activity did not occur: Safety/medical concerns         Walk 150 feet activity   Assist Walk 150 feet activity did not occur: Safety/medical concerns         Walk 10 feet on uneven surface  activity   Assist Walk 10 feet on uneven surfaces  activity did not occur: Safety/medical concerns         Wheelchair     Assist Will patient use wheelchair at discharge?: Yes(TBD) Type of Wheelchair: Manual Wheelchair activity did not occur: Safety/medical concerns(Per report )  Wheelchair assist level: Moderate Assistance - Patient 50 - 74% Max wheelchair distance: 60'    Wheelchair 50 feet with 2 turns activity    Assist        Assist Level: Moderate  Assistance - Patient 50 - 74%   Wheelchair 150 feet activity     Assist          Medical Problem List and Plan: 1.Functional and cognitive deficitssecondary to left MCA infarct  Continue CIR PT, OT, speech therapy  2. Antithrombotics: -DVT/anticoagulation:Pharmaceutical:Other (comment)--Eliquis -antiplatelet therapy: Plavix 3. Pain Management:tylenol prn 4. Mood:LCSW to follow for evaluation and support. -antipsychotic agents: N/A 5. Neuropsych: This patientis notcapable of making decisions on hisown behalf. 6. Skin/Wound Care:Routine pressure relief measures 7. Fluids/Electrolytes/Nutrition: 8. CAD: On Lipitor and Plavix --no BB due to intermittent pauses.  9. T2DM with neuropathy: Monitor BS ac/hs. Was on metformin, Glipizide and Lantus at home.   -resumed metformin bid for meal coverage, increased to 850 on 7/3.   -increase lantus to 20 on 7/2 CBG (last 3)  Recent Labs    06/16/19 1743 06/16/19 2135 06/17/19 0610  GLUCAP 114* 134* 132*   Controlled, 7/9 10.Hypoxia: Has been weaned off oxygen. Continue to monitor for recurrent fevers. 11. LUL pulmonary nodule: follow up CT in 6-12 months 12.  Right knee pain: Appears improved,  13.  Hypokalemia  Potassium 3.4 on 7/3  Labs ordered for tomorrow 14.  Acute blood loss anemia  Hemoglobin 12.8 on 6/30  Continue to monitor 15.  History of hypertension Vitals:   06/16/19 2041 06/17/19 0609  BP: (!) 114/56 125/73  Pulse: 92 80  Resp: 18 18  Temp: 98 F (36.7 C) 97.7 F (36.5 C)  SpO2: 99% 95%  Controlled, 7/9 mild elevation of diastolic  LOS: 10 days A FACE TO FACE EVALUATION WAS PERFORMED  Charlett Blake 06/17/2019, 10:38 AM

## 2019-06-17 NOTE — Progress Notes (Signed)
Occupational Therapy Session Note  Patient Details  Name: Luke Rivera MRN: [id number] Date of Birth: 1941-10-15  Today's Date: 06/17/2019 OT Individual Time: 0830-0920 OT Individual Time Calculation (min): 50 min    Short Term Goals: Week 2:  OT Short Term Goal 1 (Week 2): Pt will be able to consistently transfer to toilet with mod A of 1. OT Short Term Goal 2 (Week 2): Pt will be able to sit to stand with grab bar with min A during toileting/ LB dressing. OT Short Term Goal 3 (Week 2): Pt will demonstrate improved RUE awareness during UB dressing to fully don shirt over R arm with only min verbal/ visual cues vs. mod VC. OT Short Term Goal 4 (Week 2): Pt will be able to actively hold wash cloth in R hand to wash R thigh. OT Short Term Goal 5 (Week 2): Pt will be able to maintain static stand for 1 minute versus 20 - 30 seconds during LB self care.  Skilled Therapeutic Interventions/Progress Updates:   Pt seen for ADL training to include shower, grooming and dressing.  Pt was eating the rest of his breakfast in bed and then demonstrated excellent effort throughout the session.  He actively attempted to use his RUE as much as possible with oral care, bathing, pushed up well with L hand to transfer more efficiently with his squat pivots and leaned forward well to rise to stand in shower and at sink during LB bathing and dressing.    His RUE tone was increased today but he was still able to actively lift shoulder to wash underneath and donn shirt.    Pt resting in w/c with belt alarm on.   Estim applied to R shoulder deltoids for 60 min and removed at 1020.  Pt tolerated well.     Saebo Stim Go 330 pulse width 35 Hz pulse rate On 8 sec/ off 8 sec Ramp up/ down 2 sec Symmetrical Biphasic wave form  Max intensity 157mA at 500 Ohm load  Therapy Documentation Precautions:  Precautions Precautions: Fall Precaution Comments: R side inattention Restrictions Weight Bearing Restrictions:  No       Pain: Pain Assessment Pain Scale: 0-10 Pain Score: 0-No pain ADL: ADL Grooming: Setup Upper Body Bathing: Minimal assistance Where Assessed-Upper Body Bathing: Shower Lower Body Bathing: Moderate assistance Where Assessed-Lower Body Bathing: Shower Upper Body Dressing: Minimal assistance Where Assessed-Upper Body Dressing: Wheelchair Lower Body Dressing: Maximal assistance Where Assessed-Lower Body Dressing: Wheelchair Toileting: Dependent Where Assessed-Toileting: Bedside Commode Toilet Transfer: Other (comment)(+2 stedy lift) Science writer: Geophysical data processor: Moderate assistance Social research officer, government Method: Education officer, environmental: Grab bars, Transfer tub bench   Therapy/Group: Individual Therapy  [facility] 06/17/2019, 10:30 AM

## 2019-06-17 NOTE — Progress Notes (Signed)
Physical Therapy Session Note  Patient Details  Name: Luke Rivera MRN: [id number] Date of Birth: May 05, 1941  Today's Date: 06/17/2019 PT Individual Time: 2951-8841 and 6606-3016 PT Individual Time Calculation (min): 28 min and 58 min  Short Term Goals: Week 2:  PT Short Term Goal 1 (Week 2): Pt will perform transfer from bed<>chair with mod assist PT Short Term Goal 2 (Week 2): Pt will initiate gait training PT Short Term Goal 3 (Week 2): Pt will perform bed mobility with min assist  Skilled Therapeutic Interventions/Progress Updates:    Session 1: Pt received supine in bed with his wife present and pt agreeable to therapy session. Supine>sit, HOB partially elevated and using bedrails, with min assist for trunk upright with cuing for sequencing to increase pt independence. Sit<>stands EOB/w/c<>RW with mod assist for lifting and cuing for increased trunk extension and B LE hip/knee extension upon coming to standing for improved upright trunk posture. Ambulated ~79ft, ~94ft (seated break between) using RW with mod assist for balance and pt demonstrating ability to perform R LE swing phase with no foot clearance and decreased step length with increased time and cuing for improved mechanics. Pt left sitting in w/c with needs in reach, seat belt alarm on, and pt's wife present.   Session 2: Pt received asleep in w/c and agreeable to therapy session but states he is "tired, tired, tired."  Transported to/from gym in w/c. Stand pivot w/c>EOM using RW with mod assist for balance and AD management (due to lack of R UE ability to turn walker) as well as cuing for sequencing throughout. Sit<>stands EOM<>RW with min/mod assist for lifting/lowering and cuing for increased trunk/hip/knee extension to come to standing as well as proper UE placement on AD. Performed standing balance pre-gait task of stepping up/down with R LE on 2" step with B UE support on RW and mod assist for balance - cuing for initial contact on  step with heel and increased hip flexion to place foot on/off step. Sit>supine with min assist for R hemibody management. Performed supine R UE neuro re-ed via PNF D1 flexion/extension reversals with max multimodal cuing throughout for proper movement and min resistance provided upon coming up into flexion. Performed supine R LE neuro re-ed via PNF D1 flexion/extension reversals with pt demonstrating increased tone therefore provided more pillows to increase neck flexion posture and this decreased the tone and allowed for improved pt comfort - max multimodal cuing for PNF pattern with emphasis on timing of ankle PF but pt unable to perform DF. Supine>sit with mod assist for R LE management and trunk upright. Stand pivot transfer EOM>w/c using RW with mod assist for lifting/lowering and min/mod assist for balance while turning - cuing for sequencing of transfer with increased use of R UE to turn AD. Transported back to room and performed stand pivot transfer w/c>EOB using RW with same assist as written above for getting into w/c. Sit>supine with min assist for R LE management. Pt left supine in bed with needs in reach and bed alarm on.   Therapy Documentation Precautions:  Precautions Precautions: Fall Precaution Comments: R side inattention Restrictions Weight Bearing Restrictions: No  Pain: Session 1: Denies pain during therapy session.   Session 2: Demonstrates some pain/discomfort when lying in supine performing R LE movements due to tone but with increased neck flexion posture this decreased the tone and discomfort - performed movement to pt tolerance.   Therapy/Group: Individual Therapy  Tawana Scale, PT, DPT 06/17/2019, 1:03 PM

## 2019-06-17 NOTE — Progress Notes (Signed)
Speech Language Pathology Daily Session Note  Patient Details  Name: Luke Rivera MRN: [id number] Date of Birth: 09-Dec-1941  Today's Date: 06/17/2019 SLP Individual Time: 1020-1105 SLP Individual Time Calculation (min): 45 min  Short Term Goals: Week 2: SLP Short Term Goal 1 (Week 2): Pt will construct at least a 3 word phrase when describing pictures with 80% accuracy with min A multimodal cues. SLP Short Term Goal 2 (Week 2): Pt will attend to right side of mouth and clear oral residue with lingual sweep on 5/7 opportunities independently. SLP Short Term Goal 3 (Week 2): Pt will respond to questions with a clear/intelligible YES/NO on 8/10 opportunities with min A verbal cues for clarification. SLP Short Term Goal 4 (Week 2): Pt will listen to short, simple paragraphs and respond to yes/no questions with 80% accuracy. SLP Short Term Goal 5 (Week 2): Pt will demonstrate reading comprehension at phrase level in 9/10 opportunties with supervision A multimodal cues. SLP Short Term Goal 6 (Week 2): Pt will utilize word finding strategies with cloze phrases level in 8/10 opportunies with supervision A multimodal cues.  Skilled Therapeutic Interventions: Pt was seen for skilled ST intervention targeting goals for effective communication. Pt was able to answer yes/no questions with clear and intelligible answer with 70% accuracy. Cues for repetition increased intelligibilty to 100%. Pt and SLP looked at family photo from pt's 78th birthday party. Pt described family members using 3-word phrases. Pt able to name family members and relationship with mod assist and question cues. Continued ST intervention is recommended, per plan of care. Pt left in wheelchair, reading cards.  All needs within reach.   Pain Pain Assessment Pain Scale: 0-10 Pain Score: 0-No pain  Therapy/Group: Individual Therapy   Bibiana Gillean B. Quentin Ore, [facility], [place] Speech Language Pathologist  Shonna Chock 06/17/2019, 1:59  PM

## 2019-06-18 ENCOUNTER — Inpatient Hospital Stay (HOSPITAL_COMMUNITY): Payer: Medicare HMO | Admitting: Physical Therapy

## 2019-06-18 ENCOUNTER — Inpatient Hospital Stay (HOSPITAL_COMMUNITY): Payer: Medicare HMO | Admitting: Occupational Therapy

## 2019-06-18 ENCOUNTER — Inpatient Hospital Stay (HOSPITAL_COMMUNITY): Payer: Medicare HMO | Admitting: Speech Pathology

## 2019-06-18 LAB — GLUCOSE, CAPILLARY
Glucose-Capillary: 126 mg/dL — ABNORMAL HIGH (ref 70–99)
Glucose-Capillary: 148 mg/dL — ABNORMAL HIGH (ref 70–99)
Glucose-Capillary: 130 mg/dL — ABNORMAL HIGH (ref 70–99)
Glucose-Capillary: 126 mg/dL — ABNORMAL HIGH (ref 70–99)

## 2019-06-18 NOTE — Progress Notes (Signed)
Physical Therapy Session Note  Patient Details  Name: Luke Rivera MRN: [id number] Date of Birth: [birth year]  Today's Date: 06/18/2019 PT Individual Time: 1122-1200 PT Individual Time Calculation (min): 38 min   Short Term Goals: Week 2:  PT Short Term Goal 1 (Week 2): Pt will perform transfer from bed<>chair with mod assist PT Short Term Goal 2 (Week 2): Pt will initiate gait training PT Short Term Goal 3 (Week 2): Pt will perform bed mobility with min assist  Skilled Therapeutic Interventions/Progress Updates:  Pt received in room & agreeable to tx. No c/o pain reported. Transported pt to/from gym via w/c dependent assist for time management. Pt transfers w/c<>mat table with min assist via stand pivot with multiple attempts and decreased speed of overall movement; pt appears to have more difficulty transferring when he doe snot have physical contact on object with LUE (pt with improved ability transferring mat>w/c with LUE on w/c armrest). From significantly elevated EOM, pt performed five time sit<>stand with mod assist with BUE on RLE with focus on RLE strengthening & NMR via forced use, as well as cuing for upright posture. Pt ambulates 25 ft x 2 with RW & R hand orthosis, with min assist with cuing for RLE activation. Pt appears to have hip flexion to advance RLE but impaired ankle control as he tends to invert, and with R knee genu recurvatum in stance phase. Pt also requires increasing cuing for RW management and advancing RLE when turning or backing up to surface. At end of session pt left in w/c with chair alarm donned, call bell in reach & wife present to supervise. PA & OT also in room.   Therapy Documentation Precautions:  Precautions Precautions: Fall Precaution Comments: R side inattention Restrictions Weight Bearing Restrictions: No    Therapy/Group: Individual Therapy  Waunita Schooner 06/18/2019, 12:33 PM

## 2019-06-18 NOTE — Progress Notes (Signed)
[place] PHYSICAL MEDICINE & REHABILITATION PROGRESS NOTE   Subjective/Complaints:  Not eating well , usually eats later in am Remains aphasic  ROS: limited due to aphasia  Objective:   No results found. No results for input(s): WBC, HGB, HCT, PLT in the last 72 hours. No results for input(s): NA, K, CL, CO2, GLUCOSE, BUN, CREATININE, CALCIUM in the last 72 hours.  Intake/Output Summary (Last 24 hours) at 06/18/2019 0745 Last data filed at 06/18/2019 0410 Gross per 24 hour  Intake 240 ml  Output 275 ml  Net -35 ml     Physical Exam: Vital Signs Blood pressure (!) 141/80, pulse 88, temperature 97.8 F (36.6 C), temperature source Oral, resp. rate 12, height 6' (1.829 m), weight 109.6 kg, SpO2 95 %.  Constitutional: No distress . Vital signs reviewed. HENT: Normocephalic.  Atraumatic. Eyes: EOMI.  No discharge. Cardiovascular: No JVD. Respiratory: Normal effort. GI: Non-distended. Musc: No edema or tenderness in extremities. Neurological: Alert  Expressive aphasia, inaccurate with yes no responses tends to state no to all questions Dysarthria  Motor: RUE 2+/5 proximal distal  RLE 3-/5 proximal to distal  LUE/LLE 5/5 proximal to distal unchanged Skin: Warm and dry.  Intact. Psychiatric: Unable to assess due to cognition  Assessment/Plan: 1. 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  Physiatrist is providing close team supervision and 24 hour management of active medical problems listed below.  Physiatrist and rehab team continue to assess barriers to discharge/monitor patient progress toward functional and medical goals  Care Tool:  Bathing    Body parts bathed by patient: Right arm, Chest, Abdomen, Front perineal area, Right upper leg, Left upper leg, Left lower leg, Face   Body parts bathed by helper: Left arm, Buttocks, Right lower leg     Bathing  assist Assist Level: Moderate Assistance - Patient 50 - 74%     Upper Body Dressing/Undressing Upper body dressing   What is the patient wearing?: Pull over shirt    Upper body assist Assist Level: Contact Guard/Touching assist    Lower Body Dressing/Undressing Lower body dressing      What is the patient wearing?: Incontinence brief, Pants     Lower body assist Assist for lower body dressing: Moderate Assistance - Patient 50 - 74%     Toileting Toileting    Toileting assist Assist for toileting: Moderate Assistance - Patient 50 - 74%     Transfers Chair/bed transfer  Transfers assist     Chair/bed transfer assist level: Moderate Assistance - Patient 50 - 74%(stand pivot with RW)     Locomotion Ambulation   Ambulation assist   Ambulation activity did not occur: Safety/medical concerns  Assist level: Moderate Assistance - Patient 50 - 74% Assistive device: Walker-rolling Max distance: 16ft   Walk 10 feet activity   Assist  Walk 10 feet activity did not occur: Safety/medical concerns  Assist level: Moderate Assistance - Patient - 50 - 74% Assistive device: Walker-rolling   Walk 50 feet activity   Assist Walk 50 feet with 2 turns activity did not occur: Safety/medical concerns         Walk 150 feet activity   Assist Walk 150 feet activity did not occur: Safety/medical concerns         Walk 10 feet on uneven surface  activity   Assist Walk 10 feet on uneven surfaces activity did not occur: Safety/medical concerns  Wheelchair     Assist Will patient use wheelchair at discharge?: Yes(TBD) Type of Wheelchair: Manual Wheelchair activity did not occur: Safety/medical concerns(Per report )  Wheelchair assist level: Moderate Assistance - Patient 50 - 74% Max wheelchair distance: 60'    Wheelchair 50 feet with 2 turns activity    Assist        Assist Level: Moderate Assistance - Patient 50 - 74%   Wheelchair 150  feet activity     Assist          Medical Problem List and Plan: 1.Functional and cognitive deficitssecondary to left MCA infarct  Continue CIR PT, OT, speech therapy  2. Antithrombotics: -DVT/anticoagulation:Pharmaceutical:Other (comment)--Eliquis -antiplatelet therapy: Plavix 3. Pain Management:tylenol prn 4. Mood:LCSW to follow for evaluation and support. -antipsychotic agents: N/A 5. Neuropsych: This patientis notcapable of making decisions on hisown behalf. 6. Skin/Wound Care:Routine pressure relief measures 7. Fluids/Electrolytes/Nutrition: 8. CAD: On Lipitor and Plavix --no BB due to intermittent pauses.  9. T2DM with neuropathy: Monitor BS ac/hs. Was on metformin, Glipizide and Lantus at home.   -resumed metformin bid for meal coverage, increased to 850 on 7/3.   -increase lantus to 20 on 7/2 CBG (last 3)  Recent Labs    06/17/19 1737 06/17/19 2130 06/18/19 0643  GLUCAP 143* 148* 126*   Controlled, 7/10 10.Hypoxia: Has been weaned off oxygen. Continue to monitor for recurrent fevers. 11. LUL pulmonary nodule: follow up CT in 6-12 months 12.  Right knee pain: Appears improved,  13.  Hypokalemia  Potassium 3.4 on 7/3  Labs ordered for tomorrow  15.  History of hypertension Vitals:   06/17/19 2038 06/18/19 0407  BP: 125/64 (!) 141/80  Pulse: 92 88  Resp: 12 12  Temp: 97.6 F (36.4 C) 97.8 F (36.6 C)  SpO2: 95% 95%  Controlled, 7/10   LOS: 11 days A FACE TO FACE EVALUATION WAS PERFORMED  Charlett Blake 06/18/2019, 7:45 AM

## 2019-06-18 NOTE — Progress Notes (Signed)
Occupational Therapy Session Note  Patient Details  Name: Luke Rivera MRN: [id number] Date of Birth: [birth year]  Today's Date: 06/18/2019 OT Individual Time: 0930-1030 OT Individual Time Calculation (min): 60 min    Short Term Goals: Week 2:  OT Short Term Goal 1 (Week 2): Pt will be able to consistently transfer to toilet with mod A of 1. OT Short Term Goal 2 (Week 2): Pt will be able to sit to stand with grab bar with min A during toileting/ LB dressing. OT Short Term Goal 3 (Week 2): Pt will demonstrate improved RUE awareness during UB dressing to fully don shirt over R arm with only min verbal/ visual cues vs. mod VC. OT Short Term Goal 4 (Week 2): Pt will be able to actively hold wash cloth in R hand to wash R thigh. OT Short Term Goal 5 (Week 2): Pt will be able to maintain static stand for 1 minute versus 20 - 30 seconds during LB self care.  Skilled Therapeutic Interventions/Progress Updates:    Therapy session today focused on bed mobiity, squat pivot to w/c to L with min A, stand pivot to toilet (elev seat) with bar with min and back to R min.  Sit to stand at toilet min with bar, pt then stood with mod A for balance as he pulled pants over hips 50% of the way.  Donned shirt with set up and cues to fully place over R arm.  And then he completed himself.  Pt brushed teeth independently and used electric razor at sink.  Applied Saebo Stim Go to triceps for FES while pt worked on elbow extension using UE ranger with S. Pt worked on full active extension for 20 min.  Estim then placed on deltoids for 60 min for cyclic stimulation unattended.    Parameters: Saebo Stim Go 330 pulse width 35 Hz pulse rate On 8 sec/ off 8 sec Ramp up/ down 2 sec Symmetrical Biphasic wave form  Max intensity 162m at 500 Ohm load   Pt resting in w/c with belt alarm on and all needs met.   Therapy Documentation Precautions:  Precautions Precautions: Fall Precaution Comments: R side  inattention Restrictions Weight Bearing Restrictions: No   Pain: no c/o pain      Therapy/Group: Individual Therapy  SWellman7/09/2019, 1:07 PM

## 2019-06-18 NOTE — Progress Notes (Signed)
Speech Language Pathology Daily Session Note  Patient Details  Name: Luke Rivera MRN: [id number] Date of Birth: Aug 02, 1941  Today's Date: 06/18/2019 SLP Individual Time: 0725-0750 SLP Individual Time Calculation (min): 25 min  Short Term Goals: Week 2: SLP Short Term Goal 1 (Week 2): Pt will construct at least a 3 word phrase when describing pictures with 80% accuracy with min A multimodal cues. SLP Short Term Goal 2 (Week 2): Pt will attend to right side of mouth and clear oral residue with lingual sweep on 5/7 opportunities independently. SLP Short Term Goal 3 (Week 2): Pt will respond to questions with a clear/intelligible YES/NO on 8/10 opportunities with min A verbal cues for clarification. SLP Short Term Goal 4 (Week 2): Pt will listen to short, simple paragraphs and respond to yes/no questions with 80% accuracy. SLP Short Term Goal 5 (Week 2): Pt will demonstrate reading comprehension at phrase level in 9/10 opportunties with supervision A multimodal cues. SLP Short Term Goal 6 (Week 2): Pt will utilize word finding strategies with cloze phrases level in 8/10 opportunies with supervision A multimodal cues.  Skilled Therapeutic Interventions: Skilled treatment session focused on dysphagia and speech goals. SLP facilitated session by providing supervision level verbal cues for patient to consume breakfast meal with a slow rate and for complete oral clearance of right buccal pocketing. Patient with eventual oral expectoration of bolus due to inability to clear. During tray set-up, patient answered basic yes/no questions in regards to wants/needs with 75% accuracy. Patient left upright in bed with alarm on and all needs within reach. Continue with current plan of care.      Pain No/Denies Pain   Therapy/Group: Individual Therapy  Lysbeth Dicola 06/18/2019, 12:44 PM

## 2019-06-18 NOTE — Progress Notes (Addendum)
Speech Language Pathology Daily Session Note  Patient Details  Name: Luke Rivera MRN: [id number] Date of Birth: 11-Aug-1941  Today's Date: 06/18/2019 SLP Individual Time: 4496-7591 SLP Individual Time Calculation (min): 40 min  Short Term Goals: Week 2: SLP Short Term Goal 1 (Week 2): Pt will construct at least a 3 word phrase when describing pictures with 80% accuracy with min A multimodal cues. SLP Short Term Goal 2 (Week 2): Pt will attend to right side of mouth and clear oral residue with lingual sweep on 5/7 opportunities independently. SLP Short Term Goal 3 (Week 2): Pt will respond to questions with a clear/intelligible YES/NO on 8/10 opportunities with min A verbal cues for clarification. SLP Short Term Goal 4 (Week 2): Pt will listen to short, simple paragraphs and respond to yes/no questions with 80% accuracy. SLP Short Term Goal 5 (Week 2): Pt will demonstrate reading comprehension at phrase level in 9/10 opportunties with supervision A multimodal cues. SLP Short Term Goal 6 (Week 2): Pt will utilize word finding strategies with cloze phrases level in 8/10 opportunies with supervision A multimodal cues.  Skilled Therapeutic Interventions: Pt was seen for skilled ST intervention targeting goals for improved receptive and expressive language. Wife was present this session. SLP facilitated session by providing 2 photographs and asking pt to produce 3+ word phrases to describe the differences between the pictures. Min cues for repetition required. Pt read phrase length material aloud and chose the correct word to complete the sentence with 70% accuracy. Intelligible YES/NO responses were elicited with 63% accuracy throughout the session.  Pt was left in bed with alarm on, all needs within reach. Continue ST per current plan of care.  Pain Pain Assessment Pain Scale: 0-10 Pain Score: 0-No pain  Therapy/Group: Individual Therapy   Celia B. Quentin Ore, [facility], CCC-SLP Speech Language  Pathologist  Shonna Chock 06/18/2019, 4:27 PM

## 2019-06-18 NOTE — Progress Notes (Signed)
Patient refused entire dinner even with encouragement and nurse offering supplements. Adria Devon, LPN.

## 2019-06-18 NOTE — Progress Notes (Signed)
Physical Therapy Session Note  Patient Details  Name: Luke Rivera MRN: [id number] Date of Birth: 1941/02/13  Today's Date: 06/18/2019 PT Individual Time: 1303-1402 PT Individual Time Calculation (min): 59 min   Short Term Goals: Week 2:  PT Short Term Goal 1 (Week 2): Pt will perform transfer from bed<>chair with mod assist PT Short Term Goal 2 (Week 2): Pt will initiate gait training PT Short Term Goal 3 (Week 2): Pt will perform bed mobility with min assist  Skilled Therapeutic Interventions/Progress Updates:    Pt received sitting in w/c with his wife, [name], present and pt agreeable to therapy session.  Transported to/from gym in w/c. Stand pivot transfer w/c>EOM using RW with mod assist for balance and cuing for increased RLE foot clearance as well as improved AD management while turning. Performed repeated sit<>squat/standing postioning (varying based on pt's fatigue and motor recruitment to get up to standing)  from elevated EOM without UE support and 2" step placed under L LE to increase R LE weightbearing as well as mirror for visual feedback - mod assist for lifting/lowering control and cuing for increased anterior trunk lean and hip/knee extension to achieve standing. Performed standing with B UE support on RW (with R hand orthosis) repeated R heel tap on 2" step with focus on heel strike initial contact and increased hip flexion. Ambulated 15ft, 41ft (seated break between) using RW with min/mod assist for balance and intermittent assist for increased R LE foot clearance/step length with increasing fatigue - cuing for improved R swing phase and upright posture. Transported back to room in w/c. Noted pt's brief was not properly positioned. Sit>stand w/c>RW with mod assist for lifting. Pt able to maintain static standing balance with B UE support on RW and intermittent CGA while therapist performed total assist LB clothing management. Stand pivot to EOB using RW with min/mod assist for balance  and pt demonstrating increased difficulty stepping back with R LE when turning requiring assist and increased time. Sit>supine with min assist for R LE management. Pt left supine in bed with needs in reach, bed alarm on, and pt's wife present.     Therapy Documentation Precautions:  Precautions Precautions: Fall Precaution Comments: R side inattention Restrictions Weight Bearing Restrictions: No  Pain:  No reports of pain and no indications of pain during session.   Therapy/Group: Individual Therapy  Tawana Scale, PT, DPT 06/18/2019, 12:59 PM

## 2019-06-18 NOTE — Progress Notes (Signed)
Patient's wife calls at appx [phone] to discuss with night shift nurse pt's refusal to consume meals. Wife explains to RN that the PA, nurses, attending physician, and additional staff are aware of pt's refusal to eat. [name] states that, "he has not eaten all day". RN states that the pt consumed 120 mL of a liquid and 50% of a meal at 1241 as documented by NT. Wife states, "that is not true! He didn't eat anything, I was up there all day. He only ate half of his breakfast which was pancakes and sausage and drank a bit of juice or something around 1130". RN reiterated that is what is documented per NT. Wife continues to describe the situation for appx 10 mins and RN states, "Mrs. Child psychotherapist, staff will ensure to encourage Mr. Zielke to eat as we have been. However, he does have the right to refuse". [name] then asks RN to write a note to Dr. Letta Pate because she would like to have a meeting with her husband, herself, and Dr. Letta Pate to encourage food intake. RN agrees and hangs up phone with pt's wife around 32. RN then goes into pt room to encourage food intake. RN states, "Your wife brought some chicken soup from [employer] and there's also some pancakes and sausage for breakfast in the morning but we can try that now if you like?" Pt refused food intake even with encouragement. RN asked the pt would he like something to drink and the pt stated that he wanted a Coke. RN provided Coca Cola. RN will continue to encourage food and liquid intake and educate on importance of dietary nutrition. RN will write a physician sticky note to Dr. Letta Pate as well. Pt is asymptomatic with call bell at side. RN will continue to monitor.

## 2019-06-19 ENCOUNTER — Inpatient Hospital Stay (HOSPITAL_COMMUNITY): Payer: Medicare HMO | Admitting: Physical Therapy

## 2019-06-19 LAB — GLUCOSE, CAPILLARY
Glucose-Capillary: 136 mg/dL — ABNORMAL HIGH (ref 70–99)
Glucose-Capillary: 131 mg/dL — ABNORMAL HIGH (ref 70–99)
Glucose-Capillary: 101 mg/dL — ABNORMAL HIGH (ref 70–99)
Glucose-Capillary: 129 mg/dL — ABNORMAL HIGH (ref 70–99)

## 2019-06-19 MED ORDER — INSULIN GLARGINE 100 UNIT/ML ~~LOC~~ SOLN
22.0000 [IU] | Freq: Every day | SUBCUTANEOUS | Status: DC
  Administered 2019-06-19 – 2019-07-01 (×13): 22 [IU] via SUBCUTANEOUS
  Filled 2019-06-19 (×15): qty 0.22

## 2019-06-19 NOTE — Progress Notes (Signed)
[place] PHYSICAL MEDICINE & REHABILITATION PROGRESS NOTE   Subjective/Complaints: Patient seen sitting up in his chair this morning.  He states he slept well overnight.  Wife with concerns regarding patient's nutritional intake yesterday.  ROS: limited due to aphasia, but appears to deny CP, shortness of breath, nausea, vomiting, diarrhea.  Objective:   No results found. No results for input(s): WBC, HGB, HCT, PLT in the last 72 hours. No results for input(s): NA, K, CL, CO2, GLUCOSE, BUN, CREATININE, CALCIUM in the last 72 hours.  Intake/Output Summary (Last 24 hours) at 06/19/2019 0834 Last data filed at 06/19/2019 0700 Gross per 24 hour  Intake 342 ml  Output 750 ml  Net -408 ml     Physical Exam: Vital Signs Blood pressure 135/78, pulse 71, temperature (!) 97.4 F (36.3 C), resp. rate 16, height 6' (1.829 m), weight 109.8 kg, SpO2 96 %.  Constitutional: No distress . Vital signs reviewed. HENT: Normocephalic.  Atraumatic. Eyes: EOMI.  No discharge. Cardiovascular: No JVD. Respiratory: Normal effort. GI: Non-distended. Musc: No edema or tenderness in extremities. Neurological: Alert  Expressive aphasia Dysarthria Motor: RUE 2+/5 proximal distal  RLE 3+/5 proximal to distal  Skin: Warm and dry.  Intact. Psychiatric: Unable to assess due to cognition  Assessment/Plan: 1. 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  Physiatrist is providing close team supervision and 24 hour management of active medical problems listed below.  Physiatrist and rehab team continue to assess barriers to discharge/monitor patient progress toward functional and medical goals  Care Tool:  Bathing    Body parts bathed by patient: Right arm, Chest, Abdomen, Front perineal area, Right upper leg, Left upper leg, Left lower leg, Face   Body parts bathed by helper: Left arm,  Buttocks, Right lower leg     Bathing assist Assist Level: Moderate Assistance - Patient 50 - 74%     Upper Body Dressing/Undressing Upper body dressing   What is the patient wearing?: Pull over shirt    Upper body assist Assist Level: Contact Guard/Touching assist    Lower Body Dressing/Undressing Lower body dressing      What is the patient wearing?: Incontinence brief, Pants     Lower body assist Assist for lower body dressing: Moderate Assistance - Patient 50 - 74%     Toileting Toileting    Toileting assist Assist for toileting: Moderate Assistance - Patient 50 - 74%     Transfers Chair/bed transfer  Transfers assist     Chair/bed transfer assist level: Moderate Assistance - Patient 50 - 74%     Locomotion Ambulation   Ambulation assist   Ambulation activity did not occur: Safety/medical concerns  Assist level: Moderate Assistance - Patient 50 - 74% Assistive device: Walker-rolling Max distance: 15ft   Walk 10 feet activity   Assist  Walk 10 feet activity did not occur: Safety/medical concerns  Assist level: Moderate Assistance - Patient - 50 - 74% Assistive device: Walker-rolling   Walk 50 feet activity   Assist Walk 50 feet with 2 turns activity did not occur: Safety/medical concerns         Walk 150 feet activity   Assist Walk 150 feet activity did not occur: Safety/medical concerns         Walk 10 feet on uneven surface  activity   Assist Walk 10 feet on uneven surfaces activity did not occur: Safety/medical concerns  Wheelchair     Assist Will patient use wheelchair at discharge?: Yes(TBD) Type of Wheelchair: Manual Wheelchair activity did not occur: Safety/medical concerns(Per report )  Wheelchair assist level: Moderate Assistance - Patient 50 - 74% Max wheelchair distance: 68'    Wheelchair 50 feet with 2 turns activity    Assist        Assist Level: Moderate Assistance - Patient 50 - 74%    Wheelchair 150 feet activity     Assist          Medical Problem List and Plan: 1.Functional and cognitive deficitssecondary to left MCA infarct  Continue CIR 2. Antithrombotics: -DVT/anticoagulation:Pharmaceutical:Other (comment)--Eliquis -antiplatelet therapy: Plavix 3. Pain Management:tylenol prn 4. Mood:LCSW to follow for evaluation and support. -antipsychotic agents: N/A 5. Neuropsych: This patientis notcapable of making decisions on hisown behalf. 6. Skin/Wound Care:Routine pressure relief measures 7. Fluids/Electrolytes/Nutrition:  Continue to encourage p.o. intake variability present 8. CAD: On Lipitor and Plavix --no BB due to intermittent pauses.  9. T2DM with neuropathy: Monitor BS ac/hs. Was on metformin, Glipizide and Lantus at home.   -resumed metformin bid for meal coverage, increased to 850 on 7/3.   -increase lantus to 20 on 7/2, increased to 22 on 7/11 CBG (last 3)  Recent Labs    06/18/19 1635 06/18/19 2134 06/19/19 0610  GLUCAP 130* 126* 136*   10.Hypoxia: Has been weaned off oxygen. Continue to monitor for recurrent fevers. 11. LUL pulmonary nodule: follow up CT in 6-12 months 12.  Right knee pain:  Improved 13.  Hypokalemia  3.4 on 7/6, labs ordered for Monday  15.  History of hypertension Vitals:   06/18/19 1932 06/19/19 0429  BP: 125/66 135/78  Pulse: 92 71  Resp: 19 16  Temp: 97.9 F (36.6 C) (!) 97.4 F (36.3 C)  SpO2: 96% 96%   Controlled on 7/11  LOS: 12 days A FACE TO FACE EVALUATION WAS PERFORMED  Ankit Lorie Phenix 06/19/2019, 8:34 AM

## 2019-06-19 NOTE — Progress Notes (Signed)
Physical Therapy Session Note  Patient Details  Name: Luke Rivera MRN: [id number] Date of Birth: 07-Nov-1941  Today's Date: 06/19/2019 PT Individual Time: 1024-1125 PT Individual Time Calculation (min): 61 min   Short Term Goals: Week 2:  PT Short Term Goal 1 (Week 2): Pt will perform transfer from bed<>chair with mod assist PT Short Term Goal 2 (Week 2): Pt will initiate gait training PT Short Term Goal 3 (Week 2): Pt will perform bed mobility with min assist  Skilled Therapeutic Interventions/Progress Updates:    Pt received sitting in w/c with his wife, [name], present and pt agreeable to therapy session. Donned B shoes with max assist for time management.  Transported to/from gym in w/c. Stand pivot transfer w/c>EOM using RW with min assist for balance and AD management as well as increased time for R LE stepping. Sit>supine with mod assist for R UE and LE management. Performed supine R LE neuro re-ed via PNF D1 dynamic reversal pattern with emphasis on timing focusing on ankle PF/DF with max multimodal cuing for pattern and ankle DF. Performed supine bridging with R LE bias to promote increased muscle activation 2 sets of 10 with cuing for increased hip clearance and R glute contraction. Supine>sit with mod assist for R LE management and trunk upright. Performed sit<>stands using RW with min assist from elevated surfaces and mod assist from lower surfaces (I.e. w/c). Performed R LE neuro re-ed pre-gait task of stepping over stick to promote increased R LE foot clearance and heel strike with B UE support on RW and min assist for balance - mirror feedback for improved posture. Donned knee hyper-extension block brace on R LE to prevent knee hyperextension during stance phase then after 1st walk donned posterior leaf spring AFO on R LE to improve ankle DF and foot clearance during swing. Ambulated 75ft, 123ft (seated break between) using RW with min assist for balance and pt demonstrating improved R LE  toe/foot clearance wearing AFO - cuing throughout for improved R LE gait mechanics and increased upright trunk posture. Returned to room in w/c. Stand pivot transfer w/c>EOB using RW with min assist for balance and AD management with cuing for sequencing. While sitting on EOB pt requesting to change clothes - RN notified and pt's wife present to assist with gathering pt's clothes but therapist educated pt/wife to wait for nursing assistance for clothing management. Pt left sitting on EOB with bed alarm on, needs in reach, and his wife present to care for him until nursing arrival.  Therapy Documentation Precautions:  Precautions Precautions: Fall Precaution Comments: R side inattention Restrictions Weight Bearing Restrictions: No  Pain:  No reports of pain during session.   Therapy/Group: Individual Therapy  Tawana Scale, PT, DPT 06/19/2019, 7:49 AM

## 2019-06-20 ENCOUNTER — Inpatient Hospital Stay (HOSPITAL_COMMUNITY): Payer: Medicare HMO

## 2019-06-20 LAB — GLUCOSE, CAPILLARY
Glucose-Capillary: 119 mg/dL — ABNORMAL HIGH (ref 70–99)
Glucose-Capillary: 134 mg/dL — ABNORMAL HIGH (ref 70–99)
Glucose-Capillary: 134 mg/dL — ABNORMAL HIGH (ref 70–99)
Glucose-Capillary: 108 mg/dL — ABNORMAL HIGH (ref 70–99)

## 2019-06-20 NOTE — Progress Notes (Signed)
PHYSICAL MEDICINE & REHABILITATION PROGRESS NOTE   Subjective/Complaints: Patient seen laying in bed.  He states he slept well overnight.  No reported issues.  He is able to correct his yes/no responses.  ROS: Denies CP, shortness of breath, nausea, vomiting, diarrhea.  Objective:   No results found. No results for input(s): WBC, HGB, HCT, PLT in the last 72 hours. No results for input(s): NA, K, CL, CO2, GLUCOSE, BUN, CREATININE, CALCIUM in the last 72 hours.  Intake/Output Summary (Last 24 hours) at 06/20/2019 0856 Last data filed at 06/19/2019 2200 Gross per 24 hour  Intake 480 ml  Output 500 ml  Net -20 ml     Physical Exam: Vital Signs Blood pressure 126/75, pulse 91, temperature 97.9 F (36.6 C), temperature source Oral, resp. rate 18, height 6' (1.829 m), weight 107.5 kg, SpO2 93 %.  Constitutional: No distress . Vital signs reviewed. HENT: Normocephalic.  Atraumatic. Eyes: EOMI.  No discharge. Cardiovascular: No JVD. Respiratory: Normal effort. GI: Non-distended. Musc: No edema or tenderness in extremities. Neurological: Alert Expressive aphasia, improving Dysarthria Motor: RUE 2+/5 proximal distal  RLE 3+/5 proximal to distal  Skin: Warm and dry.  Intact. Psychiatric: Unable to assess due to cognition  Assessment/Plan: 1. 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  Physiatrist is providing close team supervision and 24 hour management of active medical problems listed below.  Physiatrist and rehab team continue to assess barriers to discharge/monitor patient progress toward functional and medical goals  Care Tool:  Bathing    Body parts bathed by patient: Right arm, Chest, Abdomen, Front perineal area, Right upper leg, Left upper leg, Left lower leg, Face   Body parts bathed by helper: Left arm, Buttocks, Right lower leg     Bathing  assist Assist Level: Moderate Assistance - Patient 50 - 74%     Upper Body Dressing/Undressing Upper body dressing   What is the patient wearing?: Pull over shirt    Upper body assist Assist Level: Contact Guard/Touching assist    Lower Body Dressing/Undressing Lower body dressing      What is the patient wearing?: Incontinence brief, Pants     Lower body assist Assist for lower body dressing: Moderate Assistance - Patient 50 - 74%     Toileting Toileting    Toileting assist Assist for toileting: Moderate Assistance - Patient 50 - 74%     Transfers Chair/bed transfer  Transfers assist     Chair/bed transfer assist level: Minimal Assistance - Patient > 75%     Locomotion Ambulation   Ambulation assist   Ambulation activity did not occur: Safety/medical concerns  Assist level: Minimal Assistance - Patient > 75% Assistive device: Walker-rolling Max distance: 176ft   Walk 10 feet activity   Assist  Walk 10 feet activity did not occur: Safety/medical concerns  Assist level: Minimal Assistance - Patient > 75% Assistive device: Walker-rolling   Walk 50 feet activity   Assist Walk 50 feet with 2 turns activity did not occur: Safety/medical concerns  Assist level: Minimal Assistance - Patient > 75% Assistive device: Walker-rolling    Walk 150 feet activity   Assist Walk 150 feet activity did not occur: Safety/medical concerns         Walk 10 feet on uneven surface  activity   Assist Walk 10 feet on uneven surfaces activity did not occur: Safety/medical concerns  Wheelchair     Assist Will patient use wheelchair at discharge?: Yes(TBD) Type of Wheelchair: Manual Wheelchair activity did not occur: Safety/medical concerns(Per report )  Wheelchair assist level: Moderate Assistance - Patient 50 - 74% Max wheelchair distance: 62'    Wheelchair 50 feet with 2 turns activity    Assist        Assist Level: Moderate  Assistance - Patient 50 - 74%   Wheelchair 150 feet activity     Assist          Medical Problem List and Plan: 1.Functional and cognitive deficitssecondary to left MCA infarct  Continue CIR 2. Antithrombotics: -DVT/anticoagulation:Pharmaceutical:Other (comment)--Eliquis -antiplatelet therapy: Plavix 3. Pain Management:tylenol prn 4. Mood:LCSW to follow for evaluation and support. -antipsychotic agents: N/A 5. Neuropsych: This patientis notcapable of making decisions on hisown behalf. 6. Skin/Wound Care:Routine pressure relief measures 7. Fluids/Electrolytes/Nutrition:  Continue to encourage, p.o. continues to be variable 8. CAD: On Lipitor and Plavix --no BB due to intermittent pauses.  9. T2DM with neuropathy: Monitor BS ac/hs. Was on metformin, Glipizide and Lantus at home.   -resumed metformin bid for meal coverage, increased to 850 on 7/3.   -increase lantus to 20 on 7/2, increased to 22 on 7/11 CBG (last 3)  Recent Labs    06/19/19 1702 06/19/19 2132 06/20/19 0631  GLUCAP 101* 129* 119*   Slightly elevated on 7/12, will need to monitor with consistent p.o. intake. 10.Hypoxia: Has been weaned off oxygen. Continue to monitor for recurrent fevers. 11. LUL pulmonary nodule: follow up CT in 6-12 months 12.  Right knee pain:  Improved 13.  Hypokalemia  3.4 on 7/6, labs ordered for tomorrow  15.  History of hypertension Vitals:   06/19/19 2000 06/20/19 0430  BP: 127/86 126/75  Pulse: 87 91  Resp:  18  Temp: 97.8 F (36.6 C) 97.9 F (36.6 C)  SpO2: 94% 93%   Controlled on 7/12  LOS: 13 days A FACE TO FACE EVALUATION WAS PERFORMED  Rexanne Inocencio Lorie Phenix 06/20/2019, 8:56 AM

## 2019-06-20 NOTE — Plan of Care (Signed)
  Problem: Consults Goal: RH STROKE PATIENT EDUCATION Description: See Patient Education module for education specifics  Outcome: Progressing   Problem: RH BOWEL ELIMINATION Goal: RH STG MANAGE BOWEL WITH ASSISTANCE Description: STG Manage Bowel with mod Assistance. Outcome: Progressing Goal: RH STG MANAGE BOWEL W/MEDICATION W/ASSISTANCE Description: STG Manage Bowel with Medication with mod Assistance. Outcome: Progressing   Problem: RH BLADDER ELIMINATION Goal: RH STG MANAGE BLADDER WITH ASSISTANCE Description: STG Manage Bladder With min Assistance Outcome: Progressing   Problem: RH SKIN INTEGRITY Goal: RH STG MAINTAIN SKIN INTEGRITY WITH ASSISTANCE Description: STG Maintain Skin Integrity With min Assistance. Outcome: Progressing   Problem: RH SAFETY Goal: RH STG ADHERE TO SAFETY PRECAUTIONS W/ASSISTANCE/DEVICE Description: STG Adhere to Safety Precautions With min cues/reminders Assistance/Device. Outcome: Progressing   Problem: RH COGNITION-NURSING Goal: RH STG ANTICIPATES NEEDS/CALLS FOR ASSIST W/ASSIST/CUES Description: STG Anticipates Needs/Calls for Assist With  min cues/reminders Assistance/Cues. Outcome: Progressing   Problem: RH KNOWLEDGE DEFICIT Goal: RH STG INCREASE KNOWLEDGE OF DIABETES Description: Pt will be able to manage DM with medications, and diet restrictions using handouts and notebooks with min assist by wife Outcome: Progressing Goal: RH STG INCREASE KNOWLEDGE OF HYPERTENSION Description: Pt will be able to manage HTN with medications, and diet restrictions using handouts and notebooks with min assist by wife Outcome: Progressing Goal: RH STG INCREASE KNOWLEDGE OF DYSPHAGIA/FLUID INTAKE Description: Pt will be able to manage dysphagia and diet restrictions using handouts and notebooks with min assist by wife Outcome: Progressing Goal: RH STG INCREASE KNOWLEGDE OF HYPERLIPIDEMIA Description: Pt will be able to manage HLD with medications, and diet  restrictions using handouts and notebooks with min assist by wife Outcome: Progressing Goal: RH STG INCREASE KNOWLEDGE OF STROKE PROPHYLAXIS Description: Pt will be able to manage secondary stroke prevention with medications, and diet restrictions using handouts and notebooks with min assist by wife Outcome: Progressing

## 2019-06-20 NOTE — Progress Notes (Signed)
Physical Therapy Session Note  Patient Details  Name: Luke Rivera MRN: [id number] Date of Birth: 19-Aug-1941  Today's Date: 06/20/2019 PT Individual Time: 0630-1601 PT Individual Time Calculation (min): 73 min   Short Term Goals: Week 2:  PT Short Term Goal 1 (Week 2): Pt will perform transfer from bed<>chair with mod assist PT Short Term Goal 2 (Week 2): Pt will initiate gait training PT Short Term Goal 3 (Week 2): Pt will perform bed mobility with min assist  Skilled Therapeutic Interventions/Progress Updates:    Pt seated in w/c upon PT arrival, agreeable to therapy tx and denies pain. Pt transported to the gym. Pt performed stand pivot transfer from w/c>mat with RW and min assist, R AFO donned. Pt worked on standing balance and LE strength to perform toe taps on aerobic step 2 x 10 per LE with UE support on RW. Pt ambulated 2 x 38 ft with RW and R AFO, min assist with cues for increased R step length, tactile cues to limit R knee hyperextension during stance. Pt worked on LE strength and knee control in order to perform 2 x 10 mini squats this session with UE support on RW, cues for techniques. Pt worked on R LE stance control while performing L LE stepping in place, mirror for visual feedback of knee hyperextension, UE support on RW, min assist. Pt performed x 10 seated LAQ with emphasis on controlled movements and holding x 3 sec at end range for R knee strength and neuro re-ed. Pt ambulated x 45 ft with RW and min assist, cues for step length and upright posture, added shoe cover for R foot clearance, blocking R knee hyperextension. Pt transported back to room, stand pivot to bed with RW and min assist. Sit>supine with min assist, left with needs in reach and bed alarm set.   Therapy Documentation Precautions:  Precautions Precautions: Fall Precaution Comments: R side inattention Restrictions Weight Bearing Restrictions: No    Therapy/Group: Individual Therapy  Netta Corrigan,  PT, DPT 06/20/2019, 2:16 PM

## 2019-06-21 ENCOUNTER — Inpatient Hospital Stay (HOSPITAL_COMMUNITY): Payer: Medicare HMO | Admitting: Occupational Therapy

## 2019-06-21 ENCOUNTER — Inpatient Hospital Stay (HOSPITAL_COMMUNITY): Payer: Medicare HMO

## 2019-06-21 LAB — BASIC METABOLIC PANEL
Anion gap: 11 (ref 5–15)
BUN: 15 mg/dL (ref 8–23)
CO2: 25 mmol/L (ref 22–32)
Calcium: 8.9 mg/dL (ref 8.9–10.3)
Chloride: 103 mmol/L (ref 98–111)
Creatinine, Ser: 0.91 mg/dL (ref 0.61–1.24)
GFR calc Af Amer: >60 mL/min (ref >60)
GFR calc non Af Amer: >60 mL/min (ref >60)
Glucose, Bld: 123 mg/dL — ABNORMAL HIGH (ref 70–99)
Potassium: 3 mmol/L — ABNORMAL LOW (ref 3.5–5.1)
Sodium: 139 mmol/L (ref 135–145)

## 2019-06-21 LAB — GLUCOSE, CAPILLARY
Glucose-Capillary: 173 mg/dL — ABNORMAL HIGH (ref 70–99)
Glucose-Capillary: 136 mg/dL — ABNORMAL HIGH (ref 70–99)
Glucose-Capillary: 118 mg/dL — ABNORMAL HIGH (ref 70–99)
Glucose-Capillary: 102 mg/dL — ABNORMAL HIGH (ref 70–99)

## 2019-06-21 LAB — CBC
HCT: 40.9 % (ref 39.0–52.0)
Hemoglobin: 13.5 g/dL (ref 13.0–17.0)
MCH: 28.4 pg (ref 26.0–34.0)
MCHC: 33 g/dL (ref 30.0–36.0)
MCV: 86.1 fL (ref 80.0–100.0)
Platelets: 423 10*3/uL — ABNORMAL HIGH (ref 150–400)
RBC: 4.75 MIL/uL (ref 4.22–5.81)
RDW: 15.3 % (ref 11.5–15.5)
WBC: 7.9 10*3/uL (ref 4.0–10.5)
nRBC: 0 % (ref 0.0–0.2)

## 2019-06-21 NOTE — Progress Notes (Signed)
Physical Therapy Session Note  Patient Details  Name: Luke Rivera MRN: [id number] Date of Birth: 02-26-1941  Today's Date: 06/21/2019 PT Individual Time: 9563-8756 and 4332-9518 PT Individual Time Calculation (min): 73 min and 30 min   Short Term Goals: Week 2:  PT Short Term Goal 1 (Week 2): Pt will perform transfer from bed<>chair with mod assist PT Short Term Goal 2 (Week 2): Pt will initiate gait training PT Short Term Goal 3 (Week 2): Pt will perform bed mobility with min assist  Skilled Therapeutic Interventions/Progress Updates:    Session 1: Pt seated in w/c upon PT arrival, agreeable to therapy tx and denies pain. Pt transported to the gym. [name] present throughout therapy session. Therapist dicussed d/c planning and home set up, pt's wife reports that there is one small step up onto the porch and then one threshold to step over into the house. Pt ambulated x 40 ft this session with RW and min assist, R AFO donned and cues for increased step length and upright posture. Pt ascended/descended 4 inch step this session with RW and min-mod assist, cues to lead with L leg going up. Pt then ambulated 2 x 20 ft with RW and min assist while stepping over hockey stick on the floor to simulate threshold, cues for techniques. Pt worked on Teacher, English as a foreign language control while performing L stepping in place, UE support on RW with verbal/tactile cues to limit R knee hyperextension, min assist. Pt transferred to supine for LE strengthening exercises, 2 x 10 each: bridges, alternating hip flexion and SAQ with cues for techniques. Pt transferred back to sitting with min assist. Pt worked on R UE neuro re-ed to perform cup stacking activity and then clothespin reaching task, hand over hand assist with cues for appropriate timing of finger flexion/extension. Pt performed 2 x 10 R wrist extension for strengthening and NMR. Stand pivot to w/c with RW and min assist and transported back to room. Stand pivot to  bed with RW and min assist, left supine with needs in reach and chair alarm set.   Session 2: Pt supine in bed upon PT arrival, agreeable to therapy tx and denies pain. Therapist donned shoes and R AFO for time management. Pt transferred to sitting with min assist and stand pivot to w/c with min assist and RW. Pt transported to the gym. Pt ambulated x 52 ft with RW and min assist, cues for R knee control and tactile cues to block R knee hyperextension. Pt worked on standing balance and knee control to perform 2 x 10 mini squats this session without UE support, cues for techniques, min assist. Pt performed x 3 sit<>stands without UE support for LE strengthening, cues for eccentric control, min-mod assist. Pt transferred to w/c with min assist stand pivot and transported back to room. Pt requesting to get back in bed, transferred to bed stand pivot with min assist and bedrail. Pt transferred to supine with min assist for R LE management and left with needs in reach and bed alarm set.  Therapy Documentation Precautions:  Precautions Precautions: Fall Precaution Comments: R side inattention Restrictions Weight Bearing Restrictions: No    Therapy/Group: Individual Therapy  Netta Corrigan, PT, DPT 06/21/2019, 7:58 AM

## 2019-06-21 NOTE — Progress Notes (Signed)
[facility] PHYSICAL MEDICINE & REHABILITATION PROGRESS NOTE   Subjective/Complaints:  No issues overnite except sore thoat, discussed with RN, no othe rsx such as cough or fever.  Pt denies heartburn although Y/N unreliable.  Occurred about 4am per RN  ROS: Denies CP, shortness of breath, nausea, vomiting, diarrhea.  Objective:   No results found. Recent Labs    06/21/19 0624  WBC 7.9  HGB 13.5  HCT 40.9  PLT 423*   Recent Labs    06/21/19 0624  NA 139  K 3.0*  CL 103  CO2 25  GLUCOSE 123*  BUN 15  CREATININE 0.91  CALCIUM 8.9    Intake/Output Summary (Last 24 hours) at 06/21/2019 0719 Last data filed at 06/21/2019 0422 Gross per 24 hour  Intake 340 ml  Output 350 ml  Net -10 ml     Physical Exam: Vital Signs Blood pressure 132/78, pulse 94, temperature 98.7 F (37.1 C), temperature source Oral, resp. rate 20, height 6' (1.829 m), weight 108.9 kg, SpO2 97 %.  Constitutional: No distress . Vital signs reviewed. HENT: Normocephalic.  Atraumatic.THroat clear no adenopathy.  No cervical adenopathy Mouth healing ulceration Left side of tongue Eyes: EOMI.  No discharge. Cardiovascular: No JVD. Respiratory: Normal effort. GI: Non-distended. Musc: No edema or tenderness in extremities. Neurological: Alert Expressive aphasia, improving Dysarthria Motor: RUE 2+/5 proximal distal  RLE 3+/5 proximal to distal  Skin: Warm and dry.  Intact. Psychiatric: Unable to assess due to cognition  Assessment/Plan: 1. 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  Physiatrist is providing close team supervision and 24 hour management of active medical problems listed below.  Physiatrist and rehab team continue to assess barriers to discharge/monitor patient progress toward functional and medical goals  Care Tool:  Bathing    Body parts bathed by patient: Right  arm, Chest, Abdomen, Front perineal area, Right upper leg, Left upper leg, Left lower leg, Face   Body parts bathed by helper: Left arm, Buttocks, Right lower leg     Bathing assist Assist Level: Moderate Assistance - Patient 50 - 74%     Upper Body Dressing/Undressing Upper body dressing   What is the patient wearing?: Pull over shirt    Upper body assist Assist Level: Contact Guard/Touching assist    Lower Body Dressing/Undressing Lower body dressing      What is the patient wearing?: Incontinence brief, Pants     Lower body assist Assist for lower body dressing: Moderate Assistance - Patient 50 - 74%     Toileting Toileting    Toileting assist Assist for toileting: Moderate Assistance - Patient 50 - 74%     Transfers Chair/bed transfer  Transfers assist     Chair/bed transfer assist level: Minimal Assistance - Patient > 75%     Locomotion Ambulation   Ambulation assist   Ambulation activity did not occur: Safety/medical concerns  Assist level: Minimal Assistance - Patient > 75% Assistive device: Walker-rolling Max distance: 45 ft   Walk 10 feet activity   Assist  Walk 10 feet activity did not occur: Safety/medical concerns  Assist level: Minimal Assistance - Patient > 75% Assistive device: Walker-rolling   Walk 50 feet activity   Assist Walk 50 feet with 2 turns activity did not occur: Safety/medical concerns  Assist level: Minimal Assistance - Patient > 75% Assistive device: Walker-rolling    Walk 150 feet activity   Assist  Walk 150 feet activity did not occur: Safety/medical concerns         Walk 10 feet on uneven surface  activity   Assist Walk 10 feet on uneven surfaces activity did not occur: Safety/medical concerns         Wheelchair     Assist Will patient use wheelchair at discharge?: Yes(TBD) Type of Wheelchair: Manual Wheelchair activity did not occur: Safety/medical concerns(Per report )  Wheelchair assist  level: Moderate Assistance - Patient 50 - 74% Max wheelchair distance: 1'    Wheelchair 50 feet with 2 turns activity    Assist        Assist Level: Moderate Assistance - Patient 50 - 74%   Wheelchair 150 feet activity     Assist          Medical Problem List and Plan: 1.Functional and cognitive deficitssecondary to left MCA infarct  CIR PT, OT, SLP 2. Antithrombotics: -DVT/anticoagulation:Pharmaceutical:Other (comment)--Eliquis -antiplatelet therapy: Plavix 3. Pain Management:tylenol prn 4. Mood:LCSW to follow for evaluation and support. -antipsychotic agents: N/A 5. Neuropsych: This patientis notcapable of making decisions on hisown behalf. 6. Skin/Wound Care:Routine pressure relief measures 7. Fluids/Electrolytes/Nutrition:  Continue to encourage, p.o. continues to be variable 8. CAD: On Lipitor and Plavix --no BB due to intermittent pauses.  9. T2DM with neuropathy: Monitor BS ac/hs. Was on metformin, Glipizide and Lantus at home.   -resumed metformin bid for meal coverage, increased to 850 on 7/3.   -increase lantus to 20 on 7/2, increased to 22 on 7/11 CBG (last 3)  Recent Labs    06/20/19 1633 06/20/19 2111 06/21/19 0644  GLUCAP 134* 108* 173*   Slightly elevated on 7/13, will need to monitor with consistent p.o. intake. 10.Hypoxia: Has been weaned off oxygen. Continue to monitor for recurrent fevers. 11. LUL pulmonary nodule: follow up CT in 6-12 months 12.  Right knee pain:  Improved 13.  Hypokalemia  3.4 on 7/6, labs ordered for tomorrow  15.  History of hypertension Vitals:   06/20/19 1916 06/21/19 0433  BP: 140/71 132/78  Pulse: 97 94  Resp: 17 20  Temp: 98.3 F (36.8 C) 98.7 F (37.1 C)  SpO2: 94% 97%   Controlled on 7/13  LOS: 14 days A FACE TO FACE EVALUATION WAS PERFORMED  Charlett Blake 06/21/2019, 7:19 AM

## 2019-06-21 NOTE — Plan of Care (Signed)
  Problem: Consults Goal: RH STROKE PATIENT EDUCATION Description: See Patient Education module for education specifics  Outcome: Progressing   Problem: RH BOWEL ELIMINATION Goal: RH STG MANAGE BOWEL WITH ASSISTANCE Description: STG Manage Bowel with mod Assistance. Outcome: Progressing Goal: RH STG MANAGE BOWEL W/MEDICATION W/ASSISTANCE Description: STG Manage Bowel with Medication with mod Assistance. Outcome: Progressing   Problem: RH BLADDER ELIMINATION Goal: RH STG MANAGE BLADDER WITH ASSISTANCE Description: STG Manage Bladder With min Assistance Outcome: Progressing   Problem: RH SKIN INTEGRITY Goal: RH STG MAINTAIN SKIN INTEGRITY WITH ASSISTANCE Description: STG Maintain Skin Integrity With min Assistance. Outcome: Progressing   Problem: RH SAFETY Goal: RH STG ADHERE TO SAFETY PRECAUTIONS W/ASSISTANCE/DEVICE Description: STG Adhere to Safety Precautions With min cues/reminders Assistance/Device. Outcome: Progressing   Problem: RH COGNITION-NURSING Goal: RH STG ANTICIPATES NEEDS/CALLS FOR ASSIST W/ASSIST/CUES Description: STG Anticipates Needs/Calls for Assist With  min cues/reminders Assistance/Cues. Outcome: Progressing   Problem: RH KNOWLEDGE DEFICIT Goal: RH STG INCREASE KNOWLEDGE OF DIABETES Description: Pt will be able to manage DM with medications, and diet restrictions using handouts and notebooks with min assist by wife Outcome: Progressing Goal: RH STG INCREASE KNOWLEDGE OF HYPERTENSION Description: Pt will be able to manage HTN with medications, and diet restrictions using handouts and notebooks with min assist by wife Outcome: Progressing Goal: RH STG INCREASE KNOWLEDGE OF DYSPHAGIA/FLUID INTAKE Description: Pt will be able to manage dysphagia and diet restrictions using handouts and notebooks with min assist by wife Outcome: Progressing Goal: RH STG INCREASE KNOWLEGDE OF HYPERLIPIDEMIA Description: Pt will be able to manage HLD with medications, and diet  restrictions using handouts and notebooks with min assist by wife Outcome: Progressing Goal: RH STG INCREASE KNOWLEDGE OF STROKE PROPHYLAXIS Description: Pt will be able to manage secondary stroke prevention with medications, and diet restrictions using handouts and notebooks with min assist by wife Outcome: Progressing

## 2019-06-21 NOTE — Progress Notes (Signed)
Occupational Therapy Session Note  Patient Details  Name: Luke Rivera MRN: [id number] Date of Birth: 1941/08/27  Today's Date: 06/21/2019 OT Individual Time: 6644-0347 OT Individual Time Calculation (min): 44 min    Short Term Goals: Week 2:  OT Short Term Goal 1 (Week 2): Pt will be able to consistently transfer to toilet with mod A of 1. OT Short Term Goal 2 (Week 2): Pt will be able to sit to stand with grab bar with min A during toileting/ LB dressing. OT Short Term Goal 3 (Week 2): Pt will demonstrate improved RUE awareness during UB dressing to fully don shirt over R arm with only min verbal/ visual cues vs. mod VC. OT Short Term Goal 4 (Week 2): Pt will be able to actively hold wash cloth in R hand to wash R thigh. OT Short Term Goal 5 (Week 2): Pt will be able to maintain static stand for 1 minute versus 20 - 30 seconds during LB self care.  Skilled Therapeutic Interventions/Progress Updates:    Pt completed transfer from bed to the wheelchair stand pivot with use of the RW and mod assist.   Increased knee hyperextension noted in the RLE.  He was able to complete grooming with setup assist and use of the RUE as a gross assist with supervision.  He was able to complete sit to stand with min assist but needed mod assist for removal of LB clothing, washing peri area, and donning new brief and shorts.  He needed min assist for applying deodorant under the left arm with use of the RUE.  Finished session with pt in the wheelchair and call button and phone in reach.  Safety alarm belt in place as well.    Sabo neuromuscular re-education electrical stimulation applied to the right deltoid unattended for 60 mins cycle  Parameters: Saebo Stim Go 330 pulse width 35 Hz pulse rate On 8 sec/ off 8 sec Ramp up/ down 2 sec Symmetrical Biphasic wave form  Max intensity 183mA at 500 Ohm load  Therapy Documentation Precautions:  Precautions Precautions: Fall Precaution Comments: R side  inattention Restrictions Weight Bearing Restrictions: No  See Care Tool Section for some details of ADL   Pain: Pain Assessment Pain Scale: Faces Pain Score: 0-No pain ADL:   Therapy/Group: Individual Therapy  Jaquavius Hudler OTR/L 06/21/2019, 12:29 PM

## 2019-06-21 NOTE — Progress Notes (Signed)
Social Work Patient ID: Luke Rivera, male   DOB: 12/29/1940, 78 y.o.   MRN: [id number] Met with wife to answer her questions about pt's care and when she would begin to start learning his care. Discussed probably next week the teams is not ready for her to begin hands on care. Have contacted [facility] to try to get pt the aide assist at discharge. Have also informed Pam-PA that wife will need pt's medications early due to he gets them through the [place]. Will work on discharge needs and provide support to both.

## 2019-06-21 NOTE — Progress Notes (Signed)
Speech Language Pathology Daily Session Note  Patient Details  Name: Luke Rivera MRN: [id number] Date of Birth: 1941-07-14  Today's Date: 06/21/2019 SLP Individual Time: 1003-1100 SLP Individual Time Calculation (min): 57 min  Short Term Goals: Week 2: SLP Short Term Goal 1 (Week 2): Pt will construct at least a 3 word phrase when describing pictures with 80% accuracy with min A multimodal cues. SLP Short Term Goal 2 (Week 2): Pt will attend to right side of mouth and clear oral residue with lingual sweep on 5/7 opportunities independently. SLP Short Term Goal 3 (Week 2): Pt will respond to questions with a clear/intelligible YES/NO on 8/10 opportunities with min A verbal cues for clarification. SLP Short Term Goal 4 (Week 2): Pt will listen to short, simple paragraphs and respond to yes/no questions with 80% accuracy. SLP Short Term Goal 5 (Week 2): Pt will demonstrate reading comprehension at phrase level in 9/10 opportunties with supervision A multimodal cues. SLP Short Term Goal 6 (Week 2): Pt will utilize word finding strategies with cloze phrases level in 8/10 opportunies with supervision A multimodal cues.  Skilled Therapeutic Interventions:Skilled ST services focused on language skills. SLP facilitated wording finding skills in phrase and sentence cloze task pt demonstrates 9 out 10 accuracy on first trials and 10 out 10 on second trials. Pt demonstrated attempts to self-correct errors and required supervision A verbal cues for phrase level and mod A verbal cues for sentence level for reading errors. Pt created 3+ word phrases (3-8 words) given picture cards identifying problems, pt required mod A verbal cues for organization of phrase/senetences. Pt demonstrated word finding skills in simple convergent naming tasks with 9 out 10 accuracy with min A verbal cues. [name] entered the last few minutes of sessions and SLP communicated about language progress with pt's premission. Pt was left in  room with wife, call bell within reach and chair alarm set. ST recommends to continue skilled ST services.      Pain Pain Assessment Pain Scale: Faces Pain Score: 0-No pain  Therapy/Group: Individual Therapy  [name]  [facility] 06/21/2019, 12:41 PM

## 2019-06-22 ENCOUNTER — Inpatient Hospital Stay (HOSPITAL_COMMUNITY): Payer: Medicare HMO | Admitting: Occupational Therapy

## 2019-06-22 ENCOUNTER — Inpatient Hospital Stay (HOSPITAL_COMMUNITY): Payer: Medicare HMO | Admitting: Physical Therapy

## 2019-06-22 ENCOUNTER — Inpatient Hospital Stay (HOSPITAL_COMMUNITY): Payer: Medicare HMO

## 2019-06-22 LAB — GLUCOSE, CAPILLARY
Glucose-Capillary: 115 mg/dL — ABNORMAL HIGH (ref 70–99)
Glucose-Capillary: 134 mg/dL — ABNORMAL HIGH (ref 70–99)
Glucose-Capillary: 90 mg/dL (ref 70–99)
Glucose-Capillary: 126 mg/dL — ABNORMAL HIGH (ref 70–99)

## 2019-06-22 MED ORDER — MEGESTROL ACETATE 400 MG/10ML PO SUSP
400.0000 mg | Freq: Two times a day (BID) | ORAL | Status: DC
  Administered 2019-06-22 – 2019-06-24 (×5): 400 mg via ORAL
  Filled 2019-06-22 (×5): qty 10

## 2019-06-22 NOTE — Plan of Care (Signed)
  Problem: RH Expression Communication Goal: LTG Patient will verbally express basic/complex needs(SLP) Description: LTG:  Patient will verbally express basic/complex needs, wants or ideas with cues  (SLP) Flowsheets (Taken 06/22/2019 1352) LTG: Patient will verbally express basic/complex needs, wants or ideas (SLP): (in response to yes/no questions and at phrase level) -- Note: Adjusted 7/14 to be more specific, in response to yes/no questions and verbally at phrase level

## 2019-06-22 NOTE — Progress Notes (Signed)
Speech Language Pathology Weekly Progress and Session Note  Patient Details  Name: Luke Rivera MRN: [id number] Date of Birth: 11/23/1941  Beginning of progress report period: June 15, 2019 End of progress report period: June 22, 2019  Today's Date: 06/22/2019 SLP Individual Time: 1230-1300 SLP Individual Time Calculation (min): 30 min  Short Term Goals: Week 2: SLP Short Term Goal 1 (Week 2): Pt will construct at least a 3 word phrase when describing pictures with 80% accuracy with min A multimodal cues. SLP Short Term Goal 1 - Progress (Week 2): Met SLP Short Term Goal 2 (Week 2): Pt will attend to right side of mouth and clear oral residue with lingual sweep on 5/7 opportunities independently. SLP Short Term Goal 2 - Progress (Week 2): Not met SLP Short Term Goal 3 (Week 2): Pt will respond to questions with a clear/intelligible YES/NO on 8/10 opportunities with min A verbal cues for clarification. SLP Short Term Goal 3 - Progress (Week 2): Met SLP Short Term Goal 4 (Week 2): Pt will listen to short, simple paragraphs and respond to yes/no questions with 80% accuracy. SLP Short Term Goal 4 - Progress (Week 2): Met SLP Short Term Goal 5 (Week 2): Pt will demonstrate reading comprehension at phrase level in 9/10 opportunties with supervision A multimodal cues. SLP Short Term Goal 5 - Progress (Week 2): Met SLP Short Term Goal 6 (Week 2): Pt will utilize word finding strategies with cloze phrases level in 8/10 opportunies with supervision A multimodal cues. SLP Short Term Goal 6 - Progress (Week 2): Met    New Short Term Goals: Week 3: SLP Short Term Goal 1 (Week 3): Pt will consume current diet with supervision A verbal cues for swallow strategies (lingual sweeps to clear oral residue.) SLP Short Term Goal 2 (Week 3): Pt will express wants/needs in response to yes/no questions and at phrase level with min A verbal cues for syntactic organization and word finding. SLP Short Term Goal 3  (Week 3): Pt will comprehend complex aduitory information with min A verbal cues.  Weekly Progress Updates: Pt met 5 out 6 goals this reporting period. Pt was only able to participate in 1 dysphagia treatment due to refusal due to nausea or limited appetite. Pt was downgraded to dys 2 due to oral pain, which has since subsided, and due to fatigue. [name] is encouraged to bring regular textured foods from home to increase PO consumption. Pt demonstrated ability to read and word finding at phrase level with supervision A verbal cues. Pt demonstrated ability to respond to functional and complex yes/no questions with 80% accuracy and following listening to simple paragraphs with min A verbal cues. Pt demonstrated ability to express wants/needs at phrase level, average of 4-6 words with mod A verbal cues for organization and word finding. Pt has participated in word finding task such as convergent naming with min A verbal cues and simple divergent naming (ex: naming 9 animals.) SLP will focus on dysphagia, complex auditory comprehension and expressing wants/needs via yes/no and at phrase level during this reporting period. Pt would continue benefit from skilled ST services in order to maximize functional independence and reduce burden of care, requiring supervision and continue ST services.     Intensity: Minumum of 1-2 x/day, 30 to 90 minutes Frequency: 3 to 5 out of 7 days Duration/Length of Stay: 7/24 Treatment/Interventions: Dysphagia/aspiration precaution training;Environmental controls;Speech/Language facilitation;Internal/external aids;Patient/family education;Functional tasks   Daily Session  Skilled Therapeutic Interventions: Skilled ST services focused on  language skills. SLP attempted to focus on dysphagia goals with skilled obersvation of lunch tray, however pt refused due to nausea, SLP notified nurse. SLP facilitated comprehension of complex yes/no questions in 13 out 15 opportunities. Pt  demonstrated word finding skills in naming members of categories given initial letter  In 9 out 10 opportunities. Pt demonstrated divergent naming of animals, 9 within 1 minute and following instruction of subcategories, pt continued to name 9 within 1 minute. Pt expressed several appropriate thoughts at phrase level " yes inch is shorter than foot" and " dial the number on phone." Pt was left in room with wife, call bell within reach and chair alarm set. ST recommends to continue skilled ST services.      General    Pain Pain Assessment Pain Scale: Faces Pain Score: 0-No pain Faces Pain Scale: No hurt  Therapy/Group: Individual Therapy  Luke Rivera  [facility] 06/22/2019, 1:58 PM

## 2019-06-22 NOTE — Progress Notes (Signed)
Patient continues to decline meals, supplements and alternative meals during shift. Patient wife is aware and informed. Adria Devon, LPN

## 2019-06-22 NOTE — Plan of Care (Signed)
  Problem: Consults Goal: RH STROKE PATIENT EDUCATION Description: See Patient Education module for education specifics  Outcome: Progressing   Problem: RH BOWEL ELIMINATION Goal: RH STG MANAGE BOWEL WITH ASSISTANCE Description: STG Manage Bowel with mod Assistance. Outcome: Progressing Goal: RH STG MANAGE BOWEL W/MEDICATION W/ASSISTANCE Description: STG Manage Bowel with Medication with mod Assistance. Outcome: Progressing   Problem: RH BLADDER ELIMINATION Goal: RH STG MANAGE BLADDER WITH ASSISTANCE Description: STG Manage Bladder With min Assistance Outcome: Progressing   Problem: RH SKIN INTEGRITY Goal: RH STG MAINTAIN SKIN INTEGRITY WITH ASSISTANCE Description: STG Maintain Skin Integrity With min Assistance. Outcome: Progressing   Problem: RH SAFETY Goal: RH STG ADHERE TO SAFETY PRECAUTIONS W/ASSISTANCE/DEVICE Description: STG Adhere to Safety Precautions With min cues/reminders Assistance/Device. Outcome: Progressing   Problem: RH COGNITION-NURSING Goal: RH STG ANTICIPATES NEEDS/CALLS FOR ASSIST W/ASSIST/CUES Description: STG Anticipates Needs/Calls for Assist With  min cues/reminders Assistance/Cues. Outcome: Progressing   Problem: RH KNOWLEDGE DEFICIT Goal: RH STG INCREASE KNOWLEDGE OF DIABETES Description: Pt will be able to manage DM with medications, and diet restrictions using handouts and notebooks with min assist by wife Outcome: Progressing Goal: RH STG INCREASE KNOWLEDGE OF HYPERTENSION Description: Pt will be able to manage HTN with medications, and diet restrictions using handouts and notebooks with min assist by wife Outcome: Progressing Goal: RH STG INCREASE KNOWLEDGE OF DYSPHAGIA/FLUID INTAKE Description: Pt will be able to manage dysphagia and diet restrictions using handouts and notebooks with min assist by wife Outcome: Progressing Goal: RH STG INCREASE KNOWLEGDE OF HYPERLIPIDEMIA Description: Pt will be able to manage HLD with medications, and diet  restrictions using handouts and notebooks with min assist by wife Outcome: Progressing Goal: RH STG INCREASE KNOWLEDGE OF STROKE PROPHYLAXIS Description: Pt will be able to manage secondary stroke prevention with medications, and diet restrictions using handouts and notebooks with min assist by wife Outcome: Progressing

## 2019-06-22 NOTE — Progress Notes (Signed)
Nutrition Follow-up  DOCUMENTATION CODES:   Obesity unspecified  INTERVENTION:   - Recommend supplementing potassium  - Recommend appetite stimulant  - RD previously recommended Cortrak for tube feeds but pt declined  -Continue Ensure Enlive poTID, each supplement provides 350 kcal and 20 grams of protein (strawberry flavor)  -ContinueMagicCupTID withlunch and dinnermeals, each supplement provides 290 kcal and 9 grams of protein  -Continue MVI with minerals daily  - Continue double protein portions with all meals  - Encourage adequate PO intake and provide feeding assistance as needed  NUTRITION DIAGNOSIS:   Moderate Malnutrition related to acute illness (stroke) as evidenced by mild fat depletion, mild muscle depletion, moderate muscle depletion, energy intake < or equal to 50% for > or equal to 5 days.  Ongoing, being addressed via oral nutrition supplements  GOAL:   Patient will meet greater than or equal to 90% of their needs  Progressing  MONITOR:   PO intake, Supplement acceptance, Diet advancement, Weight trends, Labs  REASON FOR ASSESSMENT:   Consult Poor PO, Enteral/tube feeding initiation and management  ASSESSMENT:   78 year old male with PMH of CAD/CAF, DVT, TIA, multiple back surgeries with limited mobility, STM deficits, and T2DM with peripheral neuropathy. Pt was admitted on 06/22 with garbled speech and right-sided weakness. 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 2D echo showed EF 55-60 % without wall abnormalities. Pt admitted to CIR on 6/29.  Spoke with pt and wife at bedside. Pt working on eating a  sausage biscuit that pt's wife brought from outside restaurant. Noted Ensure Enlive in a cup with ice on pt's tray table.  [name] appreciative of the work that the staff has done to try to get pt to eat more. [name] states she is bringing in whatever she can from home or from restaurants that pt may want to eat (soup from [employer], baked potato, biscuit).  Pt appears discouraged and frustrated. Pt states "I have no appetite at all." [name] counters back, stating "that's not true because you enjoyed the food I brought you yesterday."  Pt states that he is "overwhelmed" with all of the food he gets at breakfast and that it is "too much food." RD to look at [facility] and adjust pt's breakfast meals.  [name] inquiring about an appetite stimulant and an antidepressant. Inquiries were communicated with PA.  Weight down a total of 17 lbs since admission on 6/29.  Meal Completion: 0-50% x last 8 recorded meals  Medications reviewed and include: Ensure Enlive TID, SSI, Lantus 22 units daily, magic mouthwash, Metformin, liquid MVI, Protonix  Labs reviewed: potassium 3.0 CBG's: 102-136 x 24 hours  Diet Order:   Diet Order            DIET DYS 2 Room service appropriate? Yes; Fluid consistency: Thin  Diet effective now              EDUCATION NEEDS:   Not appropriate for education at this time  Skin:  Skin Assessment: Reviewed RN Assessment (MASD to bilateral buttocks, bilateral groin)  Last BM:  06/21/19  Height:   Ht Readings from Last 1 Encounters:  06/16/19 6' (1.829 m)    Weight:  Wt Readings from Last 1 Encounters:  06/22/19 108.7 kg    Ideal Body Weight:  80.9 kg  BMI:  Body mass index is 32.5 kg/m.  Estimated Nutritional Needs:   Kcal:  1900-2100  Protein:  95-110 grams  Fluid:  >/= 1.9 L    Gaynell Face, MS, RD, LDN Inpatient Clinical Dietitian Pager: [phone] Weekend/After Hours: [phone]

## 2019-06-22 NOTE — Progress Notes (Signed)
Occupational Therapy Session Note  Patient Details  Name: Luke Rivera MRN: [id number] Date of Birth: 12/04/1941  Today's Date: 06/22/2019 OT Individual Time: 1500-1533 OT Individual Time Calculation (min): 33 min    Short Term Goals: Week 2:  OT Short Term Goal 1 (Week 2): Pt will be able to consistently transfer to toilet with mod A of 1. OT Short Term Goal 2 (Week 2): Pt will be able to sit to stand with grab bar with min A during toileting/ LB dressing. OT Short Term Goal 3 (Week 2): Pt will demonstrate improved RUE awareness during UB dressing to fully don shirt over R arm with only min verbal/ visual cues vs. mod VC. OT Short Term Goal 4 (Week 2): Pt will be able to actively hold wash cloth in R hand to wash R thigh. OT Short Term Goal 5 (Week 2): Pt will be able to maintain static stand for 1 minute versus 20 - 30 seconds during LB self care.  Skilled Therapeutic Interventions/Progress Updates:    Pt transferred from supine to sit EOB with mod assist.  Began working on RUE weightbearing with transitions from right forearm to sitting with min assist.  He then worked on functional reaching with mod assist from therapist to avoid shoulder internal rotation compensation.  Had him work on reaching for a cup as well as picking up and throwing away paper towels into the trash can. Transitioned back to supine at end of session with min assist.  Pt left with call button and phone in reach with bed alarm in place as well.   Sabo neuromuscular re-education electrical stimulation applied to the right deltoid unattended for 60 mins cycle at end of session  Parameters: Saebo Stim Go 330 pulse width 35 Hz pulse rate On 8 sec/ off 8 sec Ramp up/ down 2 sec Symmetrical Biphasic wave form  Max intensity 183mA at 500 Ohm load  Pt with no adverse reactions to stimuli when therapist returned to remove it.       Therapy Documentation Precautions:  Precautions Precautions: Fall Precaution  Comments: R side inattention Restrictions Weight Bearing Restrictions: No  Pain: Pain Assessment Pain Scale: Faces Pain Score: 0-No pain   Therapy/Group: Individual Therapy  Luke Rivera OTR/L 06/22/2019, 4:28 PM

## 2019-06-22 NOTE — Progress Notes (Signed)
Physical Therapy Session Note  Patient Details  Name: Luke Rivera MRN: [id number] Date of Birth: 18-Feb-1941  Today's Date: 06/22/2019 PT Individual Time: 0803-0907 PT Individual Time Calculation (min): 64 min   Short Term Goals: Week 2:  PT Short Term Goal 1 (Week 2): Pt will perform transfer from bed<>chair with mod assist PT Short Term Goal 2 (Week 2): Pt will initiate gait training PT Short Term Goal 3 (Week 2): Pt will perform bed mobility with min assist  Skilled Therapeutic Interventions/Progress Updates:    Pt received sitting in w/c and agreeable to therapy session. Therapist donned B LE compression stockings, shoes, and R LE AFO max assist for time management. Pt wearing shoe cap on R side for improved swing phase during ambulation throughout session. Transported to/from gym in w/c. Ambulated ~18ft using RW x2 with therapist blocking R LE knee hyperextension during stance on 1st walk then donned hyperextension brace 2nd walk - cuing throughout for increased R LE step length, increased width of BOS (due to R LE adducting during swing), improved upright trunk posture, and decreased R LE knee hyperextension in stance. Performed repeated sit<>stands 2 sets of 5 elevated EOM<>no UE support with 2" step under L LE to promote increased R LE weight bearing min/mod assist for initiating lifting from mat - cuing with mirror feedback for improved B LE hip/knee extension. Seated R LE long arc quads 2x10 repetitions with cuing and assist for terminal knee extension. Pt reports need to use bathroom. Stand pivot transfer EOM>w/c using RW with min assist/CGA for steadying and cuing for R LE stepping backwards and AD management. Transported back to room in w/c. Stand pivot w/c>BSC over toilet using grab bars with CGA for steadying. Max assist LB clothing management with pt assisting with R UE. Pt continent of bladder and minimal bowels with total assist peri-care. Stand pivot back to w/c with CGA/min assist  using grab bars. Pt left sitting in w/c with needs in reach and seat belt alarm on.  Therapy Documentation Precautions:  Precautions Precautions: Fall Precaution Comments: R side inattention Restrictions Weight Bearing Restrictions: No  Pain: Pt frequently moaning/groaning during therapy session but when asked about pain pt reported none.   Therapy/Group: Individual Therapy  Tawana Scale, PT, DPT 06/22/2019, 7:53 AM

## 2019-06-22 NOTE — Progress Notes (Signed)
Occupational Therapy Session Note  Patient Details  Name: Luke Rivera MRN: [id number] Date of Birth: [birth year]  Today's Date: 06/22/2019 OT Individual Time: 5885-0277 OT Individual Time Calculation (min): 71 min    Short Term Goals: Week 2:  OT Short Term Goal 1 (Week 2): Pt will be able to consistently transfer to toilet with mod A of 1. OT Short Term Goal 2 (Week 2): Pt will be able to sit to stand with grab bar with min A during toileting/ LB dressing. OT Short Term Goal 3 (Week 2): Pt will demonstrate improved RUE awareness during UB dressing to fully don shirt over R arm with only min verbal/ visual cues vs. mod VC. OT Short Term Goal 4 (Week 2): Pt will be able to actively hold wash cloth in R hand to wash R thigh. OT Short Term Goal 5 (Week 2): Pt will be able to maintain static stand for 1 minute versus 20 - 30 seconds during LB self care.  Skilled Therapeutic Interventions/Progress Updates:    Pt completed ambulation from the wheelchair at bedside to the shower bench with mod assist using the RW for support.  Max assist for removal of right shoe and AFO with min assist for removing the left.  He then completed removal of UB clothing with supervision as well as brief and shorts with mod assist.  He completed bathing with overall mod assist sit to stand.  Mod facilitation for integration of the RUE for washing the left arm and the right LE.  He completed sit to stand with mod assist for washing buttocks as well.  Stand pivot transfer with mod assist and no assistive device was completed for transfer out of the shower.  Min assist for donning pullover shirt with min instructional cueing for hemi dressing techniques.  Mod assist for donning brief and shorts over his feet with total assist for TEDs, shoes, and AFO on the right foot.  Pt's spouse present at end of session.  Discussed possible techniques for getting into and out of the walk-in shower at home.  Will practice more next week with  family education.  Pt left in the wheelchair with call button and phone in reach and safety alarm belt in place.    Therapy Documentation Precautions:  Precautions Precautions: Fall Precaution Comments: R side inattention Restrictions Weight Bearing Restrictions: No  Pain: Pain Assessment Pain Scale: Faces Pain Score: 0-No pain Faces Pain Scale: No hurt ADL: See Care Tool Section for some details of ADL  Therapy/Group: Individual Therapy  Luke Rivera OTR/L 06/22/2019, 12:25 PM

## 2019-06-23 ENCOUNTER — Inpatient Hospital Stay (HOSPITAL_COMMUNITY): Payer: Medicare HMO

## 2019-06-23 ENCOUNTER — Encounter (HOSPITAL_COMMUNITY): Payer: Medicare HMO | Admitting: Psychology

## 2019-06-23 ENCOUNTER — Inpatient Hospital Stay (HOSPITAL_COMMUNITY): Payer: Medicare HMO | Admitting: Occupational Therapy

## 2019-06-23 LAB — GLUCOSE, CAPILLARY
Glucose-Capillary: 109 mg/dL — ABNORMAL HIGH (ref 70–99)
Glucose-Capillary: 134 mg/dL — ABNORMAL HIGH (ref 70–99)
Glucose-Capillary: 134 mg/dL — ABNORMAL HIGH (ref 70–99)
Glucose-Capillary: 140 mg/dL — ABNORMAL HIGH (ref 70–99)

## 2019-06-23 MED ORDER — LOSARTAN POTASSIUM 50 MG PO TABS
25.0000 mg | ORAL_TABLET | Freq: Every day | ORAL | Status: DC
  Administered 2019-06-23 – 2019-06-25 (×3): 25 mg via ORAL
  Filled 2019-06-23 (×3): qty 1

## 2019-06-23 NOTE — Progress Notes (Signed)
Occupational Therapy Weekly Progress Note  Patient Details  Name: Luke Rivera MRN: [id number] Date of Birth: Jun 29, 1941  Beginning of progress report period: June 16, 2019 End of progress report period: June 23, 2019  Today's Date: 06/23/2019 OT Individual Time: 0539-7673 OT Individual Time Calculation (min): 33 min    Patient has met 3 of 5 short term goals.  Luke Rivera is making steady progress with OT at this time.  He is able to complete UB selfcare with min to mod assist and LB bathing with mod assist sit to stand.  LB dressing currently requires max assist secondary to increased difficulty donning TEDs and right shoe with AFO.  Transfers have improved to a mod assist level to the toilet and the walk-in shower with use of the RW with hand splint on the right.  RUE functional use and movement has improved as well with pt currently using the RUE at a gross assist level with supervision for holding items to be opened and for holding onto the washcloth.  Mod assist is needed to use the RUE as an active assist for washing the left arm or for washing other parts of his body.  Luke Rivera continues to exhibit expressive difficulties but his ability to get out words has improved from the previous week.  Feel he is on target to meet supervision to min assist goals currently established.  Recommend continued OT at [place] level with anticipation of discharge 7/24 with spouse assisting at home and continued [place].    Patient continues to demonstrate the following deficits: muscle weakness and muscle paralysis, impaired timing and sequencing, abnormal tone, unbalanced muscle activation, decreased coordination and decreased motor planning and decreased sitting balance, decreased standing balance, decreased postural control, hemiplegia and decreased balance strategies and therefore will continue to benefit from skilled OT intervention to enhance overall performance with BADL and Reduce care partner burden.  Patient  progressing toward long term goals..  Continue plan of care.  OT Short Term Goals Week 3:  OT Short Term Goal 1 (Week 3): Continue working on establshed min assist level goals.  Skilled Therapeutic Interventions/Progress Updates:    Pt completed supine to sit EOB with min assist to start the session.  He was then able to transfer to the wheelchair stand pivot with use of the RW and mod assist.  Increased right knee flexion noted with weightbearing.  Once in the chair, therapist pushed him down to the ortho gym where he worked on Clinical research associate with use of the UE ergonometer.  Ace bandage was used to assist with maintaining grip on the right side with pt completing the first set of three mins using BUEs.  He then completed 3 more sets of 2 mins using the isolated RUE only.  Min assist to complete revolutions with resistance on level 6 and RPMs maintained around 12-15.  Rest breaks of 2-3 mins in between each set.  Finished session with return to the room and pt left sitting up in the wheelchair with call button and phone in reach and safety alarm belt in place.  Saebo Stim Go placed on the right deltoid for continuous stimulation of 60 mins.  Parameters as follows:   330 pulse width 35 Hz pulse rate On 8 sec/ off 8 sec Ramp up/ down 2 sec Symmetrical Biphasic wave form  Max intensity 156m at 500 Ohm load  Pt with no adverse reactions to stimuli when therapist returned to remove it.  Therapy Documentation Precautions:  Precautions Precautions: Fall Precaution Comments: R side inattention Restrictions Weight Bearing Restrictions: No  Pain: Pain Assessment Pain Scale: Faces Pain Score: 0-No pain   Therapy/Group: Individual Therapy  Luke Rivera OTR/L 06/23/2019, 4:33 PM

## 2019-06-23 NOTE — Progress Notes (Signed)
Speech Language Pathology Daily Session Note  Patient Details  Name: Luke Rivera MRN: [id number] Date of Birth: [birth year]  Today's Date: 06/23/2019 SLP Individual Time: 1230-1300 SLP Individual Time Calculation (min): 30 min  Short Term Goals: Week 3: SLP Short Term Goal 1 (Week 3): Pt will consume current diet with supervision A verbal cues for swallow strategies (lingual sweeps to clear oral residue.) SLP Short Term Goal 2 (Week 3): Pt will express wants/needs in response to yes/no questions and at phrase level with min A verbal cues for syntactic organization and word finding. SLP Short Term Goal 3 (Week 3): Pt will comprehend complex aduitory information with min A verbal cues.  Skilled Therapeutic Interventions: Skilled ST services focused on swallow and language skills. SLP planned to observe pt during PO consumption of meal and communicated to pt to save his meal tray, pt agreed. However upon returning pt had consumed minimal amount of meal tray and refused to continue meal. SLP facilitated PO consumption of dys 3 limited trial, pt demonstrated prolonged mastication and increased rate slightly with verbal cues and appropriate oral clearance. Pt deined pain, loss of sensation on right side or difficulty masticating food. [name] reported prolonged mastication following acute CVA.  SLP facilitated expression at phrase (4-5 word) level given picture description tasks, pt required max A verbal cues for thought organization and mod A verbal cues for word finding. Pt demonstrated x3 phrases at 3-5 word level throughout session in conversation that were organized and clear. Pt was left in room with wife, call bell within reach and chair alarm set. ST recommends to continue skilled ST services.      Pain Pain Assessment Pain Score: 0-No pain  Therapy/Group: Individual Therapy  Indalecio Malmstrom  [facility] 06/23/2019, 3:58 PM

## 2019-06-23 NOTE — Progress Notes (Signed)
Orthopedic Tech Progress Note Patient Details:  Manan Olmo 1941-10-12 [id number] Called in order to [employer] for an AFO Patient ID: Shawndale Kilpatrick, male   DOB: [birth year], 78 y.o.   MRN: [id number]   Janit Pagan 06/23/2019, 11:17 AM

## 2019-06-23 NOTE — Progress Notes (Signed)
[place] PHYSICAL MEDICINE & REHABILITATION PROGRESS NOTE   Subjective/Complaints:  No new issues , in good spirits Good BM yesterday   ROS: Denies CP, shortness of breath, nausea, vomiting, diarrhea.  Objective:   No results found. Recent Labs    06/21/19 0624  WBC 7.9  HGB 13.5  HCT 40.9  PLT 423*   Recent Labs    06/21/19 0624  NA 139  K 3.0*  CL 103  CO2 25  GLUCOSE 123*  BUN 15  CREATININE 0.91  CALCIUM 8.9    Intake/Output Summary (Last 24 hours) at 06/23/2019 0800 Last data filed at 06/23/2019 0500 Gross per 24 hour  Intake 240 ml  Output 825 ml  Net -585 ml     Physical Exam: Vital Signs Blood pressure (!) 151/72, pulse 87, temperature 97.6 F (36.4 C), resp. rate 18, height 6' (1.829 m), weight 110 kg, SpO2 95 %.  Constitutional: No distress . Vital signs reviewed. HENT: Normocephalic.  Atraumatic.THroat clear no adenopathy.  No cervical adenopathy Mouth healing ulceration Left side of tongue Eyes: EOMI.  No discharge. Cardiovascular: No JVD. Respiratory: Normal effort. GI: Non-distended. Musc: No edema or tenderness in extremities. Neurological: Alert Expressive aphasia, improving Dysarthria Motor: RUE 2+/5 proximal distal  RLE 3+/5 proximal to distal  Skin: Warm and dry.  Intact. Psychiatric: Unable to assess due to cognition  Assessment/Plan: 1. 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  Physiatrist is providing close team supervision and 24 hour management of active medical problems listed below.  Physiatrist and rehab team continue to assess barriers to discharge/monitor patient progress toward functional and medical goals  Care Tool:  Bathing    Body parts bathed by patient: Right arm, Chest, Abdomen, Front perineal area, Buttocks, Right upper leg, Left upper leg, Face   Body parts bathed by helper: Right lower leg,  Left lower leg Body parts n/a: Right arm, Left arm, Chest, Abdomen, Right upper leg, Left upper leg, Right lower leg, Left lower leg(Pt did not attempt this session.)   Bathing assist Assist Level: Moderate Assistance - Patient 50 - 74%     Upper Body Dressing/Undressing Upper body dressing   What is the patient wearing?: Pull over shirt    Upper body assist Assist Level: Minimal Assistance - Patient > 75%    Lower Body Dressing/Undressing Lower body dressing      What is the patient wearing?: Incontinence brief, Pants     Lower body assist Assist for lower body dressing: Moderate Assistance - Patient 50 - 74%     Toileting Toileting    Toileting assist Assist for toileting: Moderate Assistance - Patient 50 - 74%     Transfers Chair/bed transfer  Transfers assist     Chair/bed transfer assist level: Minimal Assistance - Patient > 75%     Locomotion Ambulation   Ambulation assist   Ambulation activity did not occur: Safety/medical concerns  Assist level: Minimal Assistance - Patient > 75% Assistive device: Walker-rolling Max distance: 107ft   Walk 10 feet activity   Assist  Walk 10 feet activity did not occur: Safety/medical concerns  Assist level: Minimal Assistance - Patient > 75% Assistive device: Walker-rolling   Walk 50 feet activity   Assist Walk 50 feet with 2 turns activity did not occur: Safety/medical concerns  Assist level: Minimal Assistance - Patient > 75% Assistive device: Walker-rolling    Walk 150 feet activity  Assist Walk 150 feet activity did not occur: Safety/medical concerns         Walk 10 feet on uneven surface  activity   Assist Walk 10 feet on uneven surfaces activity did not occur: Safety/medical concerns         Wheelchair     Assist Will patient use wheelchair at discharge?: Yes(TBD) Type of Wheelchair: Manual Wheelchair activity did not occur: Safety/medical concerns(Per report )  Wheelchair  assist level: Moderate Assistance - Patient 50 - 74% Max wheelchair distance: 26'    Wheelchair 50 feet with 2 turns activity    Assist        Assist Level: Moderate Assistance - Patient 50 - 74%   Wheelchair 150 feet activity     Assist          Medical Problem List and Plan: 1.Functional and cognitive deficitssecondary to left MCA infarct  CIR PT, OT, SLP 2. Antithrombotics: -DVT/anticoagulation:Pharmaceutical:Other (comment)--Eliquis -antiplatelet therapy: Plavix 3. Pain Management:tylenol prn 4. Mood:LCSW to follow for evaluation and support. -antipsychotic agents: N/A 5. Neuropsych: This patientis notcapable of making decisions on hisown behalf. 6. Skin/Wound Care:Routine pressure relief measures 7. Fluids/Electrolytes/Nutrition:  Continue to encourage, p.o. continues to be variable 8. CAD: On Lipitor and Plavix --no BB due to intermittent pauses.  9. T2DM with neuropathy: Monitor BS ac/hs. Was on metformin, Glipizide and Lantus at home.   -resumed metformin bid for meal coverage, increased to 850 on 7/3.   -increase lantus to 20 on 7/2, increased to 22 on 7/11 CBG (last 3)  Recent Labs    06/22/19 1637 06/22/19 2155 06/23/19 0613  GLUCAP 90 126* 109*   Slightly elevated on 7/13, will need to monitor with consistent p.o. intake. 10.Hypoxia: Has been weaned off oxygen. Continue to monitor for recurrent fevers. 11. LUL pulmonary nodule: follow up CT in 6-12 months 12.  Right knee pain:  Improved 13.  Hypokalemia  3.4 on 7/6, labs ordered for tomorrow  15.  History of hypertension Vitals:   06/22/19 2054 06/23/19 0513  BP: (!) 148/80 (!) 151/72  Pulse:  87  Resp: 18 18  Temp: 97.9 F (36.6 C) 97.6 F (36.4 C)  SpO2:  75%   Systolic elevation 6/43  LOS: 16 days A FACE TO FACE EVALUATION WAS PERFORMED  Luanna Salk Kirsteins 06/23/2019, 8:00 AM

## 2019-06-23 NOTE — Plan of Care (Signed)
  Problem: RH Balance Goal: LTG Patient will maintain dynamic standing balance (PT) Description: LTG:  Patient will maintain dynamic standing balance with assistance during mobility activities (PT) Flowsheets (Taken 06/23/2019 1116) LTG: Pt will maintain dynamic standing balance during mobility activities with:: (upgraded 7/15 due to progress) Contact Guard/Touching assist Note: upgraded 7/15 due to progress   Problem: RH Bed to Chair Transfers Goal: LTG Patient will perform bed/chair transfers w/assist (PT) Description: LTG: Patient will perform bed to chair transfers with assistance (PT). Flowsheets (Taken 06/23/2019 1116) LTG: Pt will perform Bed to Chair Transfers with assistance level: (upgraded 7/15 due to progress) Supervision/Verbal cueing Note: upgraded 7/15 due to progress   Problem: RH Car Transfers Goal: LTG Patient will perform car transfers with assist (PT) Description: LTG: Patient will perform car transfers with assistance (PT). Flowsheets (Taken 06/23/2019 1116) LTG: Pt will perform car transfers with assist:: (upgraded 7/15 due to progress) Contact Guard/Touching assist Note: upgraded 7/15 due to progress   Problem: RH Ambulation Goal: LTG Patient will ambulate in controlled environment (PT) Description: LTG: Patient will ambulate in a controlled environment, # of feet with assistance (PT). Flowsheets (Taken 06/23/2019 1116) LTG: Pt will ambulate in controlled environ  assist needed:: (upgraded 7/15 due to progress) Contact Guard/Touching assist LTG: Ambulation distance in controlled environment: 50 ft with LRAD Note: upgraded 7/15 due to progress

## 2019-06-23 NOTE — Progress Notes (Signed)
Physical Therapy Weekly Progress Note  Patient Details  Name: Luke Rivera MRN: [id number] Date of Birth: 1940/12/10  Beginning of progress report period: June 15, 2019 End of progress report period: June 23, 2019  Today's Date: 06/23/2019 PT Individual Time: 0946-1100 and 1617-1700 PT Individual Time Calculation (min): 74 min and 43 min    Patient has met 3 of 3 short term goals.  Pt is progressing with all functional mobility and is ambulating with min assist and a RW. Pt continues to have difficulty standing from lower surfaces secondary to hip weakness. Pt able demonstrates decreased R knee control and poor foot clearance limiting gait.   Patient continues to demonstrate the following deficits muscle weakness and muscle joint tightness, abnormal tone and decreased coordination, decreased awareness and decreased standing balance, hemiplegia and decreased balance strategies and therefore will continue to benefit from skilled PT intervention to increase functional independence with mobility.  Patient progressing toward long term goals..  Continue plan of care.  PT Short Term Goals Week 2:  PT Short Term Goal 1 (Week 2): Pt will perform transfer from bed<>chair with mod assist PT Short Term Goal 1 - Progress (Week 2): Met PT Short Term Goal 2 (Week 2): Pt will initiate gait training PT Short Term Goal 2 - Progress (Week 2): Met PT Short Term Goal 3 (Week 2): Pt will perform bed mobility with min assist PT Short Term Goal 3 - Progress (Week 2): Met Week 3:  PT Short Term Goal 1 (Week 3): STG=LTG  Skilled Therapeutic Interventions/Progress Updates:    Session 1: Pt supine in bed upon PT arrival, agreeable to therapy tx and denies pain. Pt donned shorts in bed with assist to loop LEs through, pt performed rolling with min assist while therapist pulled over hips. Therapist donned teds, shoes and R AFO total assist for time management. Supine>sit with min assist and use of bedrails, stand pivot  to the w/c with min assist and RW with cues for techniques.  Pt participated in body weight supported treadmill training this session with use of the litegait system. Pt performed sit<>stand from w/c with RW and min assist in order to don harness. Pt stepped on/off treadmill this session with mod +2 assist. Pt performed treadmill training as follows: Trial 1- 3:25 minutes at 0.4 mph x116 ft with manual facilitation for knee flexion during R swing phase and assist to control R knee hyperextension during stance phase, B UE support.  Trial 2- 4 minutes at 0.4-0.5 mph x156 ft with pt able to ambulate with B UE support on treadmill with R LE assist 50% of the time for R swing phase assist and stance control  Trial 3- 4:14 minutes at 0.4-0.5 mph x176 ft with incline of 3 working on step length and heel strike bilaterally, cues for increased L step length, facilitation for increased hip extension, assisting with R LE management 75% of the time.  Pt transferred to the mat with min assist. Pt worked on R LE NMR to perform seated kicking task with cues for techniques. Pt worked on standing kicking task with R UE over therapists shoulder, kicking with R LE working on swing phase/increased hip/knee flexion, kicking with L LE while working on R LE stance control blocking knee hyperextension. Pt transported back to room and left in w/c with needs in reach and chair alarm set.   Session 2: Pt seated in w/c upon PT arrival, agreeable to therapy tx and denies pain. Pt transported to the  gym in w/c. Orthotist Trinda Pascal) present this session for orthotic consult. Pt ambulated x 30 ft with R AFO, RW and min assist with cues for increased step length and for knee control. Pt ambulated another x 30 ft with RW, orthotist recommending trial of more rigid AFO and use of heel wedge, min assist. Educated pt on use of AFO for increased independence with mobility, educated pt on also checking skin frequently. Pt eager to try walking back  to his room. From the hallway pt ambulated x 111 ft to him room with RW and min assist fading to mod assist secondary to fatigue and decreased R foot clearance. Pt transferred to bed and to supine with min assist, left supine with needs in reach and bed alarm set.    Therapy Documentation Precautions:  Precautions Precautions: Fall Precaution Comments: R side inattention Restrictions Weight Bearing Restrictions: No   Therapy/Group: Individual Therapy  Netta Corrigan, PT, DPT 06/23/2019, 7:51 AM

## 2019-06-23 NOTE — Patient Care Conference (Signed)
Inpatient RehabilitationTeam Conference and Plan of Care Update Date: 06/23/2019   Time: 11:15 AM    Patient Name: Luke Rivera      Medical Record Number: [id number]  Date of Birth: 04-05-1941 Sex: Male         Room/Bed: 4W13C/4W13C-01 Payor Info: Payor: [employer] MEDICARE / Plan: [employer] MEDICARE HMO/PPO / Product Type: *No Product type* /    Admitting Diagnosis: 4. CVA 2 Team LT. CVA; 22-24days  Admit Date/Time:  06/07/2019  3:16 PM Admission Comments: No comment available   Primary Diagnosis:  <principal problem not specified> Principal Problem: <principal problem not specified>  Patient Active Problem List   Diagnosis Date Noted  . Labile blood pressure   . Diabetes mellitus type 2 in obese ([place])   . Acute blood loss anemia   . Hypokalemia   . Diabetic peripheral neuropathy ([place])   . Right knee pain   . Acute ischemic right MCA stroke ([place]) 06/07/2019  . AMS (altered mental status)   . Incidental pulmonary nodule, > 50mm and < 64mm 06/01/2019  . CVA (cerebral vascular accident) ([place]) 05/31/2019  . 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 Onychomycosis 01/11/2015  . Pain in lower limb 01/11/2015  . Hammer toe of right foot 01/02/2015  . Pain in right foot 01/02/2015    Expected Discharge Date: Expected Discharge Date: 07/02/19  Team Members Present:       Current Status/Progress Goal Weekly Team Focus  Medical   Remains a phasic right-sided weakness, blood pressures still mildly elevated.  Maintain medical stability reduce fall risk  Discharge planning   Bowel/Bladder   incon. B/b intermited con during the day with staff assit.  Regular con. of B/B with timed toileting q2hrs      Swallow/Nutrition/ Hydration   dys 2 and thin  Supervision  swallow strategies to clear right side, and regular textures allowed by family to increase intake   ADL's   Min assist for UB bathing with mod assist for UB dressing.  Min assist for toilet transfers with use of the  grab bar, min to mod for use of the walker.  Mod assist for toileting as well as LB bathing sit to stand.  Max assist for LB dressing sit to stand.  RUE functional use at a gross assist level with min faciliation.  Min A overall  selfcare retraining, transfer training, neuromuscular re-education, therapeutic exercise, pt/family education, balance retraining   Mobility   min assist for bed mobility, min assist transfers with RW stand pivot, min assist gait with RW 100 ft  supervision-CGA  gait, transfers, LE strength, balance, hip flexbility, family education and d/c planning   Communication   Min A complex yes/no response, Mod A phrase level  Min A  word fidning and thought organization strategies at phrase level, comprehenison complex aduitory information   Safety/Cognition/ Behavioral Observations            Pain   C/o mild pain.  Pain is managed by prn uses of tylenlol.      Skin   Masd to groin due to incon of b/b  Less periods of incon. keeping groin clean and dry.         *See Care Plan and progress notes for long and short-term goals.     Barriers to Discharge  Current Status/Progress Possible Resolutions Date Resolved   Physician    Medical stability     Progressing towards goals  Continue rehabilitation program  determine SNF versus home with caregiver assistance      Nursing                  PT                    OT                  SLP                SW                Discharge Planning/Teaching Needs:  Wife has been alowed in per MD and has been attending therapies with pt. Can see his progress and is concerned about being able to provide the care he needs at home. Will ask team to being hands on care when feel appropriate.      Team Discussion:  Making good progress in therapies-ambulating 100 ft with min assist-will upgrade transfer goal to supervision/CGA. E-stim with OT for arm and hand. Need order for AFO. R-UE more function also. Poor po doesn't like the  food. Wife bringing in food also. BS look good. At times incontinent of urine, had urgency with bowels prior to admission according to wife. Neuro-psych for coping. Wife here for support.  Revisions to Treatment Plan:  DC 7/24    Continued Need for Acute Rehabilitation Level of Care: The patient requires daily medical management by a physician with specialized training in physical medicine and rehabilitation for the following conditions: Daily direction of a multidisciplinary physical rehabilitation program to ensure safe treatment while eliciting the highest outcome that is of practical value to the patient.: Yes Daily medical management of patient stability for increased activity during participation in an intensive rehabilitation regime.: Yes Daily analysis of laboratory values and/or radiology reports with any subsequent need for medication adjustment of medical intervention for : Neurological problems   I attest that I was present, lead the team conference, and concur with the assessment and plan of the team. Teleconference held due to COVID 19   Lenka Zhao, Gardiner Rhyme 06/23/2019, 4:18 PM

## 2019-06-24 ENCOUNTER — Inpatient Hospital Stay (HOSPITAL_COMMUNITY): Payer: Medicare HMO | Admitting: Physical Therapy

## 2019-06-24 ENCOUNTER — Inpatient Hospital Stay (HOSPITAL_COMMUNITY): Payer: Medicare HMO | Admitting: Occupational Therapy

## 2019-06-24 ENCOUNTER — Inpatient Hospital Stay (HOSPITAL_COMMUNITY): Payer: Medicare HMO | Admitting: Speech Pathology

## 2019-06-24 DIAGNOSIS — I69391 Dysphagia following cerebral infarction: Secondary | ICD-10-CM

## 2019-06-24 DIAGNOSIS — R7309 Other abnormal glucose: Secondary | ICD-10-CM

## 2019-06-24 LAB — GLUCOSE, CAPILLARY
Glucose-Capillary: 102 mg/dL — ABNORMAL HIGH (ref 70–99)
Glucose-Capillary: 138 mg/dL — ABNORMAL HIGH (ref 70–99)
Glucose-Capillary: 99 mg/dL (ref 70–99)
Glucose-Capillary: 121 mg/dL — ABNORMAL HIGH (ref 70–99)
Glucose-Capillary: 133 mg/dL — ABNORMAL HIGH (ref 70–99)

## 2019-06-24 MED ORDER — POTASSIUM CHLORIDE CRYS ER 20 MEQ PO TBCR
20.0000 meq | EXTENDED_RELEASE_TABLET | Freq: Two times a day (BID) | ORAL | Status: DC
  Administered 2019-06-24: 20 meq via ORAL
  Filled 2019-06-24 (×2): qty 1

## 2019-06-24 MED ORDER — LOPERAMIDE HCL 2 MG PO TABS: 2.0000 mg | ORAL_TABLET | Freq: Four times a day (QID) | ORAL | 0 refills | Status: DC | PRN

## 2019-06-24 NOTE — Progress Notes (Signed)
Physical Therapy Session Note  Patient Details  Name: Luke Rivera MRN: [id number] Date of Birth: 21-May-1941  Today's Date: 06/24/2019 PT Individual Time: 7897-8478 PT Individual Time Calculation (min): 64 min   and  Today's Date: 06/24/2019 PT Missed Time: 11 Minutes Missed Time Reason: Patient ill (Comment)(nausea/vomiting/BM)  Short Term Goals: Week 3:  PT Short Term Goal 1 (Week 3): STG=LTG  Skilled Therapeutic Interventions/Progress Updates:    Pt received supine in bed holding an emesis bag reporting he vomited earlier this AM - confirmed with RN that pt vomited after taking morning medications ~1hr prior to PT arrival. Despite not feeling well pt agreeable to perform therapy session at this time. Supine>sit, HOB elevated and using bedrails, with supervision/CGA and increased time. Performed seated EOB UB dressing with min assistd and LB dressing (including shorts, B LE compression stockings, R LE AFO, and B LE shoes) with mod/max assist cuing for sequencing of dressing R HB first. Sit<>stands EOM/w/c<>RW with min assist for lifting/lowering throughout session - cuing for increased trunk/hip/knee extension for upright posture upon coming to stand. Stand pivot EOB>w/c using RW with min assist for balance and AD management - cuing for sequencing and improved AD management. Seated oral care in w/c at sink with min assist. After pt reported "I'm open for business." Transported to/from gym in w/c. Ambulated 74ft using RW with min assist for balance and manual facilitation for blocking R knee hyperextension during stance as well as cuing for increased R LE foot clearance and step length during swing. After the walk pt reports "I feel pretty crummy" but when asked if he wanted to walk again pt said yes. Ambulated 23ft back to w/c min A - same cuing and manual facilitation as ablove then at end of walk pt reports "I feel strange.Marland KitchenMarland KitchenI feel weak" Vitals assessed BP 158/80 (MAP 105), HR 103bpm, SpO2 98%.  Transported back to room and RN notified of pt's symptoms. Pt performed stand pivot transfer w/c>BSC over toilet using grab bars with min/mod assist due to 1x R knee buckling. Total assist LB clothing management while pt stood using grab bar with min assist for balance. Pt continent of urine and BM on toilet. Sit>stand BSC over toilet>grab bars with mod assist for lifting and despite cuing pt unable to come to upright stance but rather maintained forward trunk flexed and B LE hip/knee flexion - total assist peri-care and LB clothing management. Mod assist squat pivot to w/c. Pt left sitting in w/c with needs in reach and seat belt alarm on.   Therapy Documentation Precautions:  Precautions Precautions: Fall Precaution Comments: R side inattention Restrictions Weight Bearing Restrictions: No  Pain: States "not really" when questioned about pain.   Therapy/Group: Individual Therapy  Tawana Scale, PT, DPT 06/24/2019, 7:57 AM

## 2019-06-24 NOTE — Progress Notes (Signed)
Occupational Therapy Session Note  Patient Details  Name: Ketih Goodie MRN: [id number] Date of Birth: [birth year]  Today's Date: 06/24/2019 OT Individual Time: 1100-1210 OT Individual Time Calculation (min): 70 min    Short Term Goals: Week 2:  OT Short Term Goal 1 (Week 2): Pt will be able to consistently transfer to toilet with mod A of 1. OT Short Term Goal 1 - Progress (Week 2): Met OT Short Term Goal 2 (Week 2): Pt will be able to sit to stand with grab bar with min A during toileting/ LB dressing. OT Short Term Goal 2 - Progress (Week 2): Not met OT Short Term Goal 3 (Week 2): Pt will demonstrate improved RUE awareness during UB dressing to fully don shirt over R arm with only min verbal/ visual cues vs. mod VC. OT Short Term Goal 3 - Progress (Week 2): Met OT Short Term Goal 4 (Week 2): Pt will be able to actively hold wash cloth in R hand to wash R thigh. OT Short Term Goal 4 - Progress (Week 2): Not met OT Short Term Goal 5 (Week 2): Pt will be able to maintain static stand for 1 minute versus 20 - 30 seconds during LB self care. OT Short Term Goal 5 - Progress (Week 2): Met  Skilled Therapeutic Interventions/Progress Updates:    Pt's spouse present for session so took him down to the therapy gym for practice with simulated walk-in shower transfers.  Pt needs use of the tub bench for transfer with mod assist overall.  Discussed placement of seat as well as need for hand held shower.  Had pt's spouse assist pt with sit to stand but not transfer yet as pt was not feeling well and needed slightly more assist this session compared to previous sessions.  Returned to room at conclusion of practice with stand pivot transfer to the bed with mod assist using the RW.  Pt transferred from sitting to supine with mod assist as well.  Neuromuscular stimulation placed on right deltoid at end of session for facilitation.  Parameters as follows:  Saebo Stim Go 330 pulse width 35 Hz pulse rate On 8  sec/ off 8 sec Ramp up/ down 2 sec Symmetrical Biphasic wave form  Max intensity 127m at 500 Ohm load  Pt with no adverse reactions to stimuli when therapist returned to remove it.    Therapy Documentation Precautions:  Precautions Precautions: Fall Precaution Comments: expressive difficulty Restrictions Weight Bearing Restrictions: No  Pain: Pain Assessment Pain Scale: Faces Pain Score: 0-No pain ADL: See Care Tool Section for some details of transfers  Therapy/Group: Individual Therapy  Kipling Graser OTR/L 06/24/2019, 12:44 PM

## 2019-06-24 NOTE — Progress Notes (Signed)
Speech Language Pathology Daily Session Note  Patient Details  Name: Breion Novacek MRN: [id number] Date of Birth: 02-Apr-1941  Today's Date: 06/24/2019 SLP Individual Time: 4481-8563 SLP Individual Time Calculation (min): 55 min  Short Term Goals: Week 3: SLP Short Term Goal 1 (Week 3): Pt will consume current diet with supervision A verbal cues for swallow strategies (lingual sweeps to clear oral residue.) SLP Short Term Goal 2 (Week 3): Pt will express wants/needs in response to yes/no questions and at phrase level with min A verbal cues for syntactic organization and word finding. SLP Short Term Goal 3 (Week 3): Pt will comprehend complex aduitory information with min A verbal cues.  Skilled Therapeutic Interventions: Skilled treatment session focused on communication goals. Patient had received several cards and SLP facilitated session by providing Mod A verbal and visual cues for patient to self-monitor and correct errors while reading at the phrase and sentence level and to scan to right to locate entire sentence. Patient also participated in a comparing/contrasting task between 2 items. Patient named both items with overall 90% accuracy and supervision level verbal cues but required extra time and Mod A verbal cues to verbalize the differences at the phrase level and Min A verbal cues to verbalize the similarities at the phrase level. Patient left upright in bed with alarm on and all needs within reach. Continue with current plan of care.      Pain Pain Assessment Pain Scale: Faces Pain Score: 0-No pain  Therapy/Group: Individual Therapy  Ashvin Adelson 06/24/2019, 2:58 PM

## 2019-06-24 NOTE — Progress Notes (Signed)
[facility] PHYSICAL MEDICINE & REHABILITATION PROGRESS NOTE   Subjective/Complaints: Patient seen sitting up in bed this morning.  He states he slept well overnight.  He denies complaints.  ROS: Denies CP, shortness of breath, nausea, vomiting, diarrhea.  Objective:   No results found. No results for input(s): WBC, HGB, HCT, PLT in the last 72 hours. No results for input(s): NA, K, CL, CO2, GLUCOSE, BUN, CREATININE, CALCIUM in the last 72 hours.  Intake/Output Summary (Last 24 hours) at 06/24/2019 1049 Last data filed at 06/24/2019 0900 Gross per 24 hour  Intake 100 ml  Output 250 ml  Net -150 ml     Physical Exam: Vital Signs Blood pressure (!) 158/80, pulse (!) 103, temperature 97.9 F (36.6 C), resp. rate 20, height 6' (1.829 m), weight 109.1 kg, SpO2 96 %.  Constitutional: No distress . Vital signs reviewed. HENT: Normocephalic.  Atraumatic. Eyes: EOMI.  No discharge. Cardiovascular: No JVD. Respiratory: Normal effort. GI: Non-distended. Musc: No edema or tenderness in extremities. Neurological: Alert Expressive aphasia, improving Dysarthria Motor: RUE 2+/5 proximal distal, unchanged RLE 3+/5 proximal to distal, unchanged Skin: Warm and dry.  Intact. Psychiatric: Unable to assess due to cognition  Assessment/Plan: 1. 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  Physiatrist is providing close team supervision and 24 hour management of active medical problems listed below.  Physiatrist and rehab team continue to assess barriers to discharge/monitor patient progress toward functional and medical goals  Care Tool:  Bathing    Body parts bathed by patient: Right arm, Chest, Abdomen, Front perineal area, Buttocks, Right upper leg, Left upper leg, Face   Body parts bathed by helper: Right lower leg, Left lower leg Body parts n/a: Right arm, Left arm, Chest,  Abdomen, Right upper leg, Left upper leg, Right lower leg, Left lower leg(Pt did not attempt this session.)   Bathing assist Assist Level: Moderate Assistance - Patient 50 - 74%     Upper Body Dressing/Undressing Upper body dressing   What is the patient wearing?: Pull over shirt    Upper body assist Assist Level: Minimal Assistance - Patient > 75%    Lower Body Dressing/Undressing Lower body dressing      What is the patient wearing?: Incontinence brief, Pants     Lower body assist Assist for lower body dressing: Moderate Assistance - Patient 50 - 74%     Toileting Toileting    Toileting assist Assist for toileting: Moderate Assistance - Patient 50 - 74%     Transfers Chair/bed transfer  Transfers assist     Chair/bed transfer assist level: Moderate Assistance - Patient 50 - 74%     Locomotion Ambulation   Ambulation assist   Ambulation activity did not occur: Safety/medical concerns  Assist level: Minimal Assistance - Patient > 75% Assistive device: Walker-rolling Max distance: 30 ft   Walk 10 feet activity   Assist  Walk 10 feet activity did not occur: Safety/medical concerns  Assist level: Minimal Assistance - Patient > 75% Assistive device: Walker-rolling   Walk 50 feet activity   Assist Walk 50 feet with 2 turns activity did not occur: Safety/medical concerns  Assist level: Minimal Assistance - Patient > 75% Assistive device: Walker-rolling    Walk 150 feet activity   Assist Walk 150 feet activity did not occur: Safety/medical concerns         Walk 10 feet on uneven surface  activity  Assist Walk 10 feet on uneven surfaces activity did not occur: Safety/medical concerns         Wheelchair     Assist Will patient use wheelchair at discharge?: Yes(TBD) Type of Wheelchair: Manual Wheelchair activity did not occur: Safety/medical concerns(Per report )  Wheelchair assist level: Moderate Assistance - Patient 50 - 74% Max  wheelchair distance: 62'    Wheelchair 50 feet with 2 turns activity    Assist        Assist Level: Moderate Assistance - Patient 50 - 74%   Wheelchair 150 feet activity     Assist          Medical Problem List and Plan: 1.Functional and cognitive deficitssecondary to left MCA infarct  Continue CIR 2. Antithrombotics: -DVT/anticoagulation:Pharmaceutical:Other (comment)--Eliquis -antiplatelet therapy: Plavix 3. Pain Management:tylenol prn 4. Mood:LCSW to follow for evaluation and support. -antipsychotic agents: N/A 5. Neuropsych: This patientis notcapable of making decisions on hisown behalf. 6. Skin/Wound Care:Routine pressure relief measures 7. Fluids/Electrolytes/Nutrition:  Continue to encourage, p.o. continues to be variable/poor 8. CAD: On Lipitor and Plavix --no BB due to intermittent pauses.  9. T2DM with neuropathy: Monitor BS ac/hs. Was on metformin, Glipizide and Lantus at home.   -resumed metformin bid for meal coverage, increased to 850 on 7/3.   -increase lantus to 20 on 7/2, increased to 22 on 7/11 CBG (last 3)  Recent Labs    06/23/19 1705 06/23/19 2110 06/24/19 0612  GLUCAP 134* 140* 102*   Slightly labile, we will not make any changes given inconsistent p.o. intake 10.Hypoxia: Has been weaned off oxygen. Continue to monitor for recurrent fevers. 11. LUL pulmonary nodule: follow up CT in 6-12 months 12.  Right knee pain:  Improved 13.  Hypokalemia  Potassium 3.0 on 7/13  Supplement initiated on 7/16  Labs ordered for tomorrow  15.  History of hypertension Vitals:   06/24/19 0408 06/24/19 0926  BP: 123/69 (!) 158/80  Pulse: 83 (!) 103  Resp: 20   Temp: 97.9 F (36.6 C)   SpO2: 96%    Labile on 7/16 16.  Post stroke dysphagia  On D2 thins, advance diet as tolerated  See #7  LOS: 17 days A FACE TO FACE EVALUATION WAS PERFORMED   Lorie Phenix 06/24/2019, 10:49 AM

## 2019-06-24 NOTE — Progress Notes (Signed)
Social Work Patient ID: Luke Rivera, male   DOB: 10-Feb-1941, 78 y.o.   MRN: [id number] Spoke with Ewing Schlein at [facility] in [place] who reports paperwork has been submitted for aide for four days -3 hours per day, will find out next week if approved. This will help out wife in taking care of pt. She is concerned about providing 24 hr per day care. Can also get equipment but reports it does take time and may not be in time for pt's discharge. Discussed using pt's health insurance for equipment coverage. Pam-PA did talk with wife to go over the medications both current and what was on at home prior to admission. Will work on discharge needs.

## 2019-06-25 ENCOUNTER — Inpatient Hospital Stay (HOSPITAL_COMMUNITY): Payer: Medicare HMO | Admitting: Speech Pathology

## 2019-06-25 ENCOUNTER — Inpatient Hospital Stay (HOSPITAL_COMMUNITY): Payer: Medicare HMO | Admitting: Occupational Therapy

## 2019-06-25 ENCOUNTER — Inpatient Hospital Stay (HOSPITAL_COMMUNITY): Payer: Medicare HMO | Admitting: Physical Therapy

## 2019-06-25 LAB — BASIC METABOLIC PANEL
Anion gap: 11 (ref 5–15)
Anion gap: 12 (ref 5–15)
BUN: 22 mg/dL (ref 8–23)
BUN: 24 mg/dL — ABNORMAL HIGH (ref 8–23)
CO2: 24 mmol/L (ref 22–32)
CO2: 23 mmol/L (ref 22–32)
Calcium: 8.9 mg/dL (ref 8.9–10.3)
Calcium: 9 mg/dL (ref 8.9–10.3)
Chloride: 103 mmol/L (ref 98–111)
Chloride: 102 mmol/L (ref 98–111)
Creatinine, Ser: 1.5 mg/dL — ABNORMAL HIGH (ref 0.61–1.24)
Creatinine, Ser: 2.06 mg/dL — ABNORMAL HIGH (ref 0.61–1.24)
GFR calc Af Amer: 50 mL/min — ABNORMAL LOW (ref >60)
GFR calc Af Amer: 34 mL/min — ABNORMAL LOW (ref >60)
GFR calc non Af Amer: 43 mL/min — ABNORMAL LOW (ref >60)
GFR calc non Af Amer: 30 mL/min — ABNORMAL LOW (ref >60)
Glucose, Bld: 100 mg/dL — ABNORMAL HIGH (ref 70–99)
Glucose, Bld: 172 mg/dL — ABNORMAL HIGH (ref 70–99)
Potassium: 2.6 mmol/L — CL (ref 3.5–5.1)
Potassium: 3.1 mmol/L — ABNORMAL LOW (ref 3.5–5.1)
Sodium: 138 mmol/L (ref 135–145)
Sodium: 137 mmol/L (ref 135–145)

## 2019-06-25 LAB — GLUCOSE, CAPILLARY
Glucose-Capillary: 87 mg/dL (ref 70–99)
Glucose-Capillary: 106 mg/dL — ABNORMAL HIGH (ref 70–99)
Glucose-Capillary: 120 mg/dL — ABNORMAL HIGH (ref 70–99)
Glucose-Capillary: 105 mg/dL — ABNORMAL HIGH (ref 70–99)

## 2019-06-25 MED ORDER — SODIUM CHLORIDE 0.45 % IV SOLN
INTRAVENOUS | Status: DC
  Administered 2019-06-25 – 2019-06-26 (×2): via INTRAVENOUS

## 2019-06-25 MED ORDER — POTASSIUM CHLORIDE 10 MEQ/50ML IV SOLN
10.0000 meq | INTRAVENOUS | Status: AC
  Administered 2019-06-25 (×4): 10 meq via INTRAVENOUS
  Filled 2019-06-25 (×4): qty 50

## 2019-06-25 MED ORDER — POTASSIUM CHLORIDE CRYS ER 20 MEQ PO TBCR
20.0000 meq | EXTENDED_RELEASE_TABLET | Freq: Two times a day (BID) | ORAL | Status: DC
  Administered 2019-06-26 – 2019-06-29 (×7): 20 meq via ORAL
  Filled 2019-06-25 (×7): qty 1

## 2019-06-25 MED ORDER — POTASSIUM CHLORIDE CRYS ER 20 MEQ PO TBCR
40.0000 meq | EXTENDED_RELEASE_TABLET | Freq: Once | ORAL | Status: AC
  Administered 2019-06-25: 40 meq via ORAL
  Filled 2019-06-25: qty 2

## 2019-06-25 NOTE — Progress Notes (Signed)
Social Work Patient ID: Alejandro Mulling, male   DOB: 06-24-1941, 78 y.o.   MRN: [id number] Met with pt and wife to answer her questions and address her concerns. She still nervous about making sure he has the medicaitions he needs at home and Pam-PA has spoken with her also. Discussed [facility] in [facility] for aide awaiting approval for this. Wife to begin hands on care next week-team aware. Wife will also need nursing education regarding checking pt's blood sugars and insulin teaching. Neuro-psych to see on Monday since his speech is improving now more appropriate. Continue to work on discharge needs for next Friday.

## 2019-06-25 NOTE — Progress Notes (Signed)
Orthopedic Tech Progress Note Patient Details:  Luke Rivera June 08, 1941 [id number]  Patient ID: Luke Rivera, male   DOB: 1941/03/02, 78 y.o.   MRN: [id number]   [name] 06/25/2019, 12:21 PMCalled [facility] for right knee Hyperextension block brace.

## 2019-06-25 NOTE — Plan of Care (Signed)
  Problem: Consults Goal: RH STROKE PATIENT EDUCATION Description: See Patient Education module for education specifics  Outcome: Progressing   Problem: RH BOWEL ELIMINATION Goal: RH STG MANAGE BOWEL WITH ASSISTANCE Description: STG Manage Bowel with mod Assistance. 06/25/2019 0400 by Lazaro Arms, RN Outcome: Progressing 06/25/2019 0128 by Lazaro Arms, RN Outcome: Progressing Goal: RH STG MANAGE BOWEL W/MEDICATION W/ASSISTANCE Description: STG Manage Bowel with Medication with mod Assistance. 06/25/2019 0400 by Lazaro Arms, RN Outcome: Progressing 06/25/2019 0128 by Lazaro Arms, RN Outcome: Progressing   Problem: RH BLADDER ELIMINATION Goal: RH STG MANAGE BLADDER WITH ASSISTANCE Description: STG Manage Bladder With min Assistance 06/25/2019 0400 by Lazaro Arms, RN Outcome: Progressing 06/25/2019 0128 by Lazaro Arms, RN Outcome: Progressing   Problem: RH SKIN INTEGRITY Goal: RH STG MAINTAIN SKIN INTEGRITY WITH ASSISTANCE Description: STG Maintain Skin Integrity With min Assistance. 06/25/2019 0400 by Lazaro Arms, RN Outcome: Progressing 06/25/2019 0128 by Lazaro Arms, RN Outcome: Progressing   Problem: RH SAFETY Goal: RH STG ADHERE TO SAFETY PRECAUTIONS W/ASSISTANCE/DEVICE Description: STG Adhere to Safety Precautions With min cues/reminders Assistance/Device. 06/25/2019 0400 by Lazaro Arms, RN Outcome: Progressing 06/25/2019 0128 by Lazaro Arms, RN Outcome: Progressing   Problem: RH COGNITION-NURSING Goal: RH STG ANTICIPATES NEEDS/CALLS FOR ASSIST W/ASSIST/CUES Description: STG Anticipates Needs/Calls for Assist With  min cues/reminders Assistance/Cues. 06/25/2019 0400 by Lazaro Arms, RN Outcome: Progressing 06/25/2019 0128 by Lazaro Arms, RN Outcome: Progressing   Problem: RH KNOWLEDGE DEFICIT Goal: RH STG INCREASE KNOWLEDGE OF DIABETES Description: Pt will be able to manage DM with medications, and diet  restrictions using handouts and notebooks with min assist by wife 06/25/2019 0400 by Lazaro Arms, RN Outcome: Progressing 06/25/2019 0128 by Lazaro Arms, RN Outcome: Progressing Goal: RH STG INCREASE KNOWLEDGE OF HYPERTENSION Description: Pt will be able to manage HTN with medications, and diet restrictions using handouts and notebooks with min assist by wife 06/25/2019 0400 by Lazaro Arms, RN Outcome: Progressing 06/25/2019 0128 by Lazaro Arms, RN Outcome: Progressing Goal: RH STG INCREASE KNOWLEDGE OF DYSPHAGIA/FLUID INTAKE Description: Pt will be able to manage dysphagia and diet restrictions using handouts and notebooks with min assist by wife Outcome: Progressing Goal: RH STG INCREASE KNOWLEGDE OF HYPERLIPIDEMIA Description: Pt will be able to manage HLD with medications, and diet restrictions using handouts and notebooks with min assist by wife Outcome: Progressing Goal: RH STG INCREASE KNOWLEDGE OF STROKE PROPHYLAXIS Description: Pt will be able to manage secondary stroke prevention with medications, and diet restrictions using handouts and notebooks with min assist by wife Outcome: Progressing

## 2019-06-25 NOTE — Progress Notes (Signed)
Physical Therapy Session Note  Patient Details  Name: Luke Rivera MRN: [id number] Date of Birth: Jun 24, 1941  Today's Date: 06/25/2019 PT Individual Time: 1109-1207 PT Individual Time Calculation (min): 58 min   Short Term Goals: Week 3:  PT Short Term Goal 1 (Week 3): STG=LTG  Skilled Therapeutic Interventions/Progress Updates:    Pt received sitting in w/c with RN present and OT stopping by to communicate that Pam,PA wants to limit strenuous activity today until potassium levels improve. Pt agreeable to therapy session.  Transported to/from gym in w/c. Orthotist present to assess fit/function of RLE posterior leaf spring AFO and shoe toe cap. Pt ambulated ~59ft using RW with min assist and cuing for improved R LE foot clearance during swing phase with AFO assisting with ankle DF and foot clearance - pt continues to demonstrate R knee hyperextension during stance phase - discussed with orthotist regarding need for a knee hyperextension brace - PA notified. Patient unable to ambulate further reporting fatigue. [name] arrived with several questions regarding what equipment pt will need at D/C and how to obtain that equipment as well as regarding her being prepared to assist him at home - therapist educated her regarding recommended equipment and follow-up therapy as well as discussing further insurance concerns with SW - SW notified of the wife's questions. Stand pivot chair>EOM using RW with min assist for balance while turning and mod assist for eccentric control lowering to sit. Sit>supine with mod assist for R LE management and trunk descent.  Performed the following exercises in supine on R LE with multimodal cuing for proper technique/form: - bridging with manual facilitation for increased R LE weightbearing and glute activation - R UE PROM during rest break including elbow flexion/extension and shoulder flexion - heel slides with cuing for decreased speed of movement and improved  control/coordination - therapist performed R hip ER stretch 2x1 minute - hip abduction with pt demonstrating significant compensation from hip flexors Supine>sit with mod assist for R LE management and trunk upright. Stand pivot EOM>w/c using RW with min assist for balance - cuing for stepping with R LE and turning AD. Transported back to room in w/c and left sitting in w/c with needs in reach, seat belt alarm on, and pt's wife present.  Therapy Documentation Precautions:  Precautions Precautions: Fall Precaution Comments: expressive difficulty Restrictions Weight Bearing Restrictions: No  Pain: Pt frequently moaning/groaning during exercises but continues to deny pain upon questioning.   Therapy/Group: Individual Therapy  Tawana Scale, PT, DPT 06/25/2019, 7:52 AM

## 2019-06-25 NOTE — Progress Notes (Signed)
Speech Language Pathology Daily Session Note  Patient Details  Name: Luke Rivera MRN: [id number] Date of Birth: 24-Nov-1941  Today's Date: 06/25/2019 SLP Individual Time: 1300-1355 SLP Individual Time Calculation (min): 55 min  Short Term Goals: Week 3: SLP Short Term Goal 1 (Week 3): Pt will consume current diet with supervision A verbal cues for swallow strategies (lingual sweeps to clear oral residue.) SLP Short Term Goal 2 (Week 3): Pt will express wants/needs in response to yes/no questions and at phrase level with min A verbal cues for syntactic organization and word finding. SLP Short Term Goal 3 (Week 3): Pt will comprehend complex aduitory information with min A verbal cues.  Skilled Therapeutic Interventions: Skilled treatment session focused on communication goals. SLP facilitated session by providing extra time and Mod A verbal cues for word-finding and use of speech intelligibility strategies during a verbal description task at the phrase and sentence level. SLP also facilitated session by providing education to the patient and his wife in regards to the impact fatigue has on overall verbal expression. Patient left upright in wheelchair with alarm on and all needs within reach. Continue with current plan of care.      Pain No/Denies Pain   Therapy/Group: Individual Therapy  Wandra Babin 06/25/2019, 3:12 PM

## 2019-06-25 NOTE — Progress Notes (Signed)
Time spent with wife going over OTC, home meds and hospital medications--rationale for need of meds, current changes as well as the fact that they may be adjusted further prior to discharge. Relayed that she would need to take Rx to local pharmacies that contract with [facility] or to [facility] in [place] for refills. Nursing will educate her on insulin administration/management prior to d/c.

## 2019-06-25 NOTE — Significant Event (Signed)
CRITICAL VALUE ALERT  Critical Value:  K+  Date & Time Notied:  0733 06/25/2019  Provider Notified: Algis Liming PA  Orders Received/Actions taken: Give 40 mEq po now, start IV and give 4 runs IV potassium. Margarito Liner

## 2019-06-25 NOTE — Progress Notes (Signed)
[facility] PHYSICAL MEDICINE & REHABILITATION PROGRESS NOTE   Subjective/Complaints:  Patient without complaints.  We discussed his appetite as well as his fluid intake.  We discussed the need for IV fluids as well as medication changes.  ROS: Denies CP, shortness of breath, nausea, vomiting, diarrhea.  Objective:   No results found. No results for input(s): WBC, HGB, HCT, PLT in the last 72 hours. Recent Labs    06/25/19 0552  NA 138  K 2.6*  CL 103  CO2 24  GLUCOSE 100*  BUN 22  CREATININE 1.50*  CALCIUM 8.9    Intake/Output Summary (Last 24 hours) at 06/25/2019 0739 Last data filed at 06/25/2019 0500 Gross per 24 hour  Intake 140 ml  Output 600 ml  Net -460 ml     Physical Exam: Vital Signs Blood pressure 125/60, pulse 87, temperature 97.6 F (36.4 C), temperature source Oral, resp. rate 18, height 6' (1.829 m), weight 106.9 kg, SpO2 96 %.  Constitutional: No distress . Vital signs reviewed. HENT: Normocephalic.  Atraumatic. Eyes: EOMI.  No discharge. Cardiovascular: No JVD. Respiratory: Normal effort. GI: Non-distended. Musc: No edema or tenderness in extremities. Neurological: Alert Expressive aphasia, improving Dysarthria Motor: RUE 2+/5 proximal distal, unchanged RLE 3+/5 proximal to distal, unchanged Skin: Warm and dry.  Intact. Psychiatric: Unable to assess due to cognition  Assessment/Plan: 1. 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  Physiatrist is providing close team supervision and 24 hour management of active medical problems listed below.  Physiatrist and rehab team continue to assess barriers to discharge/monitor patient progress toward functional and medical goals  Care Tool:  Bathing    Body parts bathed by patient: Right arm, Chest, Abdomen, Front perineal area, Buttocks, Right upper leg, Left upper leg, Face   Body parts  bathed by helper: Right lower leg, Left lower leg Body parts n/a: Right arm, Left arm, Chest, Abdomen, Right upper leg, Left upper leg, Right lower leg, Left lower leg(Pt did not attempt this session.)   Bathing assist Assist Level: Moderate Assistance - Patient 50 - 74%     Upper Body Dressing/Undressing Upper body dressing   What is the patient wearing?: Pull over shirt    Upper body assist Assist Level: Minimal Assistance - Patient > 75%    Lower Body Dressing/Undressing Lower body dressing      What is the patient wearing?: Incontinence brief, Pants     Lower body assist Assist for lower body dressing: Moderate Assistance - Patient 50 - 74%     Toileting Toileting    Toileting assist Assist for toileting: Moderate Assistance - Patient 50 - 74%     Transfers Chair/bed transfer  Transfers assist     Chair/bed transfer assist level: Minimal Assistance - Patient > 75%     Locomotion Ambulation   Ambulation assist   Ambulation activity did not occur: Safety/medical concerns  Assist level: Minimal Assistance - Patient > 75% Assistive device: Walker-rolling Max distance: 60ft   Walk 10 feet activity   Assist  Walk 10 feet activity did not occur: Safety/medical concerns  Assist level: Minimal Assistance - Patient > 75% Assistive device: Walker-rolling   Walk 50 feet activity   Assist Walk 50 feet with 2 turns activity did not occur: Safety/medical concerns  Assist level: Minimal Assistance - Patient > 75% Assistive device: Walker-rolling    Walk 150 feet activity   Assist Walk  150 feet activity did not occur: Safety/medical concerns         Walk 10 feet on uneven surface  activity   Assist Walk 10 feet on uneven surfaces activity did not occur: Safety/medical concerns         Wheelchair     Assist Will patient use wheelchair at discharge?: Yes(TBD) Type of Wheelchair: Manual Wheelchair activity did not occur: Safety/medical  concerns(Per report )  Wheelchair assist level: Moderate Assistance - Patient 50 - 74% Max wheelchair distance: 24'    Wheelchair 50 feet with 2 turns activity    Assist        Assist Level: Moderate Assistance - Patient 50 - 74%   Wheelchair 150 feet activity     Assist          Medical Problem List and Plan: 1.Functional and cognitive deficitssecondary to left MCA infarct  Continue CIR 2. Antithrombotics: -DVT/anticoagulation:Pharmaceutical:Other (comment)--Eliquis -antiplatelet therapy: Plavix 3. Pain Management:tylenol prn 4. Mood:LCSW to follow for evaluation and support. -antipsychotic agents: N/A 5. Neuropsych: This patientis notcapable of making decisions on hisown behalf. 6. Skin/Wound Care:Routine pressure relief measures 7. Fluids/Electrolytes/Nutrition:  Continue to encourage, p.o. continues to be variable/poor 8. CAD: On Lipitor and Plavix --no BB due to intermittent pauses.  9. T2DM with neuropathy: Monitor BS ac/hs. Was on metformin, Glipizide and Lantus at home.   -resumed metformin bid for meal coverage, increased to 850 on 7/3. AM CBG low and creat elevated will d/c am monitor needs fluids  -increase lantus to 20 on 7/2, increased to 22 on 7/11 CBG (last 3)  Recent Labs    06/24/19 2038 06/24/19 2128 06/25/19 0626  GLUCAP 133* 121* 87   Slightly labile, lower today, hold metformin related to elevated creatinine 10.Hypoxia: Has been weaned off oxygen. Continue to monitor for recurrent fevers. 11. LUL pulmonary nodule: follow up CT in 6-12 months 12.  Right knee pain:  Improved 13.  Hypokalemia  Potassium 3.0 on 7/13  Supplement initiated on 7/16, KCl 10 mEq IV x4  Labs ordered for tomorrow  15.  History of hypertension Vitals:   06/24/19 1932 06/25/19 0532  BP: 101/60 125/60  Pulse: 93 87  Resp: 18 18  Temp: 98 F (36.7 C) 97.6 F (36.4 C)  SpO2: 96% 96%   Running lower, needs fluids will  hold ARB d/t elevated creat, will restart Cozaar once patient is hydrated 16.  Post stroke dysphagia  On D2 thins, advance diet as tolerated  See #7  LOS: 18 days A FACE TO [place] E  06/25/2019, 7:39 AM

## 2019-06-25 NOTE — Progress Notes (Signed)
Social Work Patient ID: Luke Rivera, male   DOB: Oct 16, 1941, 78 y.o.   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  A walker  will not resolve issue with performing activities of daily living.  A wheelchair will allow patient to safely perform daily activities.  Patient is not able to propel themselves in the home using a standard weight wheelchair due to endurance and fatigue .  Patient can self propel in the lightweight wheelchair.

## 2019-06-25 NOTE — Plan of Care (Signed)
  Problem: Consults Goal: RH STROKE PATIENT EDUCATION Description: See Patient Education module for education specifics  Outcome: Progressing   Problem: RH BOWEL ELIMINATION Goal: RH STG MANAGE BOWEL WITH ASSISTANCE Description: STG Manage Bowel with mod Assistance. Outcome: Progressing Goal: RH STG MANAGE BOWEL W/MEDICATION W/ASSISTANCE Description: STG Manage Bowel with Medication with mod Assistance. Outcome: Progressing   Problem: RH BLADDER ELIMINATION Goal: RH STG MANAGE BLADDER WITH ASSISTANCE Description: STG Manage Bladder With min Assistance Outcome: Progressing   Problem: RH SKIN INTEGRITY Goal: RH STG MAINTAIN SKIN INTEGRITY WITH ASSISTANCE Description: STG Maintain Skin Integrity With min Assistance. Outcome: Progressing   Problem: RH SAFETY Goal: RH STG ADHERE TO SAFETY PRECAUTIONS W/ASSISTANCE/DEVICE Description: STG Adhere to Safety Precautions With min cues/reminders Assistance/Device. Outcome: Progressing   Problem: RH COGNITION-NURSING Goal: RH STG ANTICIPATES NEEDS/CALLS FOR ASSIST W/ASSIST/CUES Description: STG Anticipates Needs/Calls for Assist With  min cues/reminders Assistance/Cues. Outcome: Progressing   Problem: RH KNOWLEDGE DEFICIT Goal: RH STG INCREASE KNOWLEDGE OF DIABETES Description: Pt will be able to manage DM with medications, and diet restrictions using handouts and notebooks with min assist by wife Outcome: Progressing Goal: RH STG INCREASE KNOWLEDGE OF HYPERTENSION Description: Pt will be able to manage HTN with medications, and diet restrictions using handouts and notebooks with min assist by wife Outcome: Progressing Goal: RH STG INCREASE KNOWLEDGE OF DYSPHAGIA/FLUID INTAKE Description: Pt will be able to manage dysphagia and diet restrictions using handouts and notebooks with min assist by wife Outcome: Progressing Goal: RH STG INCREASE KNOWLEGDE OF HYPERLIPIDEMIA Description: Pt will be able to manage HLD with medications, and diet  restrictions using handouts and notebooks with min assist by wife Outcome: Progressing Goal: RH STG INCREASE KNOWLEDGE OF STROKE PROPHYLAXIS Description: Pt will be able to manage secondary stroke prevention with medications, and diet restrictions using handouts and notebooks with min assist by wife Outcome: Progressing

## 2019-06-25 NOTE — Progress Notes (Signed)
Occupational Therapy Session Note  Patient Details  Name: Luke Rivera MRN: [id number] Date of Birth: 1941/06/21  Today's Date: 06/25/2019 OT Individual Time: 8003-4917 OT Individual Time Calculation (min): 75 min    Short Term Goals: Week 3:  OT Short Term Goal 1 (Week 3): Continue working on establshed min assist level goals.  Skilled Therapeutic Interventions/Progress Updates:    Pt completed bathing and dressing sit to stand at the sink secondary to having running IV.  Therapist provided wash mit for use on the RUE to assist with bathing of most body parts.  Mod assist for integration of the RUE to wash his left arm, chest, abdomen, and parts of the left and right leg.  Washcloth was used with the right hand for washing all other parts including peri area and buttocks.  He completed several sit to stand transitions with min instructional cueing and min assist.  Mod assist to pull his pants and brief over his feet.  Mod assist for donning pullover shirt.  He needed total assist for donning TEDs with max assist for gripper socks.  Finished session with pt completing grooming tasks of brushing his hair and his teeth with setup.  Pt left in wheelchair with call button and phone in reach with safety belt in place.  NMES was applied to the right deltoid as well with parameters listed below: Saebo Stim Go 330 pulse width 35 Hz pulse rate On 8 sec/ off 8 sec Ramp up/ down 2 sec Symmetrical Biphasic wave form  Max intensity 158mA at 500 Ohm load  Pt with no adverse reactions to stimuli when therapist returned to remove it.  Therapy Documentation Precautions:  Precautions Precautions: Fall Precaution Comments: expressive difficulty Restrictions Weight Bearing Restrictions: No   Pain: Pain Assessment Pain Scale: Faces Pain Score: 0-No pain Faces Pain Scale: No hurt ADL: See Care Tool Section for some details of ADL  Therapy/Group: Individual Therapy  Alva Kuenzel  OTR/L 06/25/2019, 12:18 PM

## 2019-06-25 NOTE — Progress Notes (Signed)
Patient refused to wear brace x 2.

## 2019-06-26 ENCOUNTER — Inpatient Hospital Stay (HOSPITAL_COMMUNITY): Payer: Medicare HMO

## 2019-06-26 LAB — BASIC METABOLIC PANEL
Anion gap: 12 (ref 5–15)
BUN: 21 mg/dL (ref 8–23)
CO2: 20 mmol/L — ABNORMAL LOW (ref 22–32)
Calcium: 9 mg/dL (ref 8.9–10.3)
Chloride: 105 mmol/L (ref 98–111)
Creatinine, Ser: 1.36 mg/dL — ABNORMAL HIGH (ref 0.61–1.24)
GFR calc Af Amer: 57 mL/min — ABNORMAL LOW (ref >60)
GFR calc non Af Amer: 49 mL/min — ABNORMAL LOW (ref >60)
Glucose, Bld: 117 mg/dL — ABNORMAL HIGH (ref 70–99)
Potassium: 3.6 mmol/L (ref 3.5–5.1)
Sodium: 137 mmol/L (ref 135–145)

## 2019-06-26 LAB — GLUCOSE, CAPILLARY
Glucose-Capillary: 89 mg/dL (ref 70–99)
Glucose-Capillary: 152 mg/dL — ABNORMAL HIGH (ref 70–99)
Glucose-Capillary: 122 mg/dL — ABNORMAL HIGH (ref 70–99)
Glucose-Capillary: 129 mg/dL — ABNORMAL HIGH (ref 70–99)

## 2019-06-26 NOTE — Progress Notes (Signed)
Patient refused ensure supplement, prafo boot and magic mouthwash at hs. Pt slept well throughout the night. Displaying some difficulty communicating, has aphasia, becomes irritated while voicing his requests. Tolerated IV fluids throughout the night with minimal difficulty, irritated at times when pump alarms.

## 2019-06-26 NOTE — Plan of Care (Signed)
  Problem: Consults Goal: RH STROKE PATIENT EDUCATION Description: See Patient Education module for education specifics  Outcome: Progressing   Problem: RH BLADDER ELIMINATION Goal: RH STG MANAGE BLADDER WITH ASSISTANCE Description: STG Manage Bladder With min Assistance Outcome: Progressing   Problem: RH SKIN INTEGRITY Goal: RH STG MAINTAIN SKIN INTEGRITY WITH ASSISTANCE Description: STG Maintain Skin Integrity With min Assistance. Outcome: Progressing   Problem: RH SAFETY Goal: RH STG ADHERE TO SAFETY PRECAUTIONS W/ASSISTANCE/DEVICE Description: STG Adhere to Safety Precautions With min cues/reminders Assistance/Device. Outcome: Progressing

## 2019-06-26 NOTE — Progress Notes (Signed)
Physical Therapy Session Note  Patient Details  Name: Luke Rivera MRN: [id number] Date of Birth: 1941-06-18  Today's Date: 06/26/2019 PT Individual Time: 0901-1000 PT Individual Time Calculation (min): 59 min   Short Term Goals: Week 3:  PT Short Term Goal 1 (Week 3): STG=LTG  Skilled Therapeutic Interventions/Progress Updates:    Pt supine in bed upon PT arrival, agreeable to therapy tx and denies pain. Pt supine in bed while therapist donned teds total assist, looped LEs through pants and donned shoes/AFO total assist for time management. Pt transferred to sitting EOB with supervision and use of bedrails. Pt performed sit<>stand with min assist in order to pull pants over hips. Stand pivot to w/c with RW and min assist. Pt transported to the gym. Pt ambulated x 78 ft this session with RW and min assist, cues for increased R step length/foot clearance and cues for upright posture. Pt worked on standing balance and R LE clearance in order to perform x 10 standing toe taps on aerobic step with R LE, R UE support over therapists shoulder. Pt worked on Dietitian, R stance control and R foot clearance to perform R LE kicking cone task with L lateral sidestepping, R UE support over therapists shoulder and mod assist. Pt reports having to use bathroom, transported back to room. Pt ambulated from w/c<>toilet this session with RW and min assist 2 x 10 ft, standing balance with min assist while therapist assisted with clothing management. Pt continent of bladder with use of urinal. Pt unable to have bowel movement. Pt seated in w/c at the sink to wash hands, cues for increased use of R UE. Pt left in w/c at end of session with needs in reach and chair alarm set.   Therapy Documentation Precautions:  Precautions Precautions: Fall Precaution Comments: expressive difficulty Restrictions Weight Bearing Restrictions: No    Therapy/Group: Individual Therapy  Netta Corrigan, PT, DPT 06/26/2019,  7:47 AM

## 2019-06-26 NOTE — Progress Notes (Signed)
[facility] PHYSICAL MEDICINE & REHABILITATION PROGRESS NOTE   Subjective/Complaints:  Received IV fluids last night, creatinine elevated holding Cozaar  ROS: Denies CP, shortness of breath, nausea, vomiting, diarrhea.  Objective:   No results found. No results for input(s): WBC, HGB, HCT, PLT in the last 72 hours. Recent Labs    06/25/19 0552 06/25/19 1529  NA 138 137  K 2.6* 3.1*  CL 103 102  CO2 24 23  GLUCOSE 100* 172*  BUN 22 24*  CREATININE 1.50* 2.06*  CALCIUM 8.9 9.0    Intake/Output Summary (Last 24 hours) at 06/26/2019 1052 Last data filed at 06/26/2019 0701 Gross per 24 hour  Intake 1042 ml  Output 500 ml  Net 542 ml     Physical Exam: Vital Signs Blood pressure 127/68, pulse 79, temperature 98 F (36.7 C), resp. rate 18, height 6' (1.829 m), weight 108.6 kg, SpO2 97 %.  Constitutional: No distress . Vital signs reviewed. HENT: Normocephalic.  Atraumatic. Eyes: EOMI.  No discharge. Cardiovascular: No JVD. Respiratory: Normal effort. GI: Non-distended. Musc: No edema or tenderness in extremities. Neurological: Alert Expressive aphasia, improving Dysarthria Motor: RUE 2+/5 proximal distal, unchanged RLE 3+/5 proximal to distal, unchanged Skin: Warm and dry.  Intact. Psychiatric: Unable to assess due to cognition  Assessment/Plan: 1. 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  Physiatrist is providing close team supervision and 24 hour management of active medical problems listed below.  Physiatrist and rehab team continue to assess barriers to discharge/monitor patient progress toward functional and medical goals  Care Tool:  Bathing    Body parts bathed by patient: Right arm, Chest, Abdomen, Front perineal area, Buttocks, Right upper leg, Left upper leg, Face, Right lower leg   Body parts bathed by helper: Left lower leg, Left  arm Body parts n/a: Right arm, Left arm, Chest, Abdomen, Right upper leg, Left upper leg, Right lower leg, Left lower leg(Pt did not attempt this session.)   Bathing assist Assist Level: Moderate Assistance - Patient 50 - 74%     Upper Body Dressing/Undressing Upper body dressing   What is the patient wearing?: Pull over shirt    Upper body assist Assist Level: Moderate Assistance - Patient 50 - 74%    Lower Body Dressing/Undressing Lower body dressing      What is the patient wearing?: Incontinence brief, Pants     Lower body assist Assist for lower body dressing: Moderate Assistance - Patient 50 - 74%     Toileting Toileting    Toileting assist Assist for toileting: Moderate Assistance - Patient 50 - 74%     Transfers Chair/bed transfer  Transfers assist     Chair/bed transfer assist level: Minimal Assistance - Patient > 75%     Locomotion Ambulation   Ambulation assist   Ambulation activity did not occur: Safety/medical concerns  Assist level: Minimal Assistance - Patient > 75% Assistive device: Walker-rolling Max distance: 78 ft   Walk 10 feet activity   Assist  Walk 10 feet activity did not occur: Safety/medical concerns  Assist level: Minimal Assistance - Patient > 75% Assistive device: Walker-rolling   Walk 50 feet activity   Assist Walk 50 feet with 2 turns activity did not occur: Safety/medical concerns  Assist level: Minimal Assistance - Patient > 75% Assistive device: Walker-rolling    Walk 150 feet activity   Assist Walk 150 feet activity did not occur: Safety/medical  concerns         Walk 10 feet on uneven surface  activity   Assist Walk 10 feet on uneven surfaces activity did not occur: Safety/medical concerns         Wheelchair     Assist Will patient use wheelchair at discharge?: Yes(TBD) Type of Wheelchair: Manual Wheelchair activity did not occur: Safety/medical concerns(Per report )  Wheelchair assist  level: Moderate Assistance - Patient 50 - 74% Max wheelchair distance: 32'    Wheelchair 50 feet with 2 turns activity    Assist        Assist Level: Moderate Assistance - Patient 50 - 74%   Wheelchair 150 feet activity     Assist          Medical Problem List and Plan: 1.Functional and cognitive deficitssecondary to left MCA infarct  Continue CIR 2. Antithrombotics: -DVT/anticoagulation:Pharmaceutical:Other (comment)--Eliquis -antiplatelet therapy: Plavix 3. Pain Management:tylenol prn 4. Mood:LCSW to follow for evaluation and support. -antipsychotic agents: N/A 5. Neuropsych: This patientis notcapable of making decisions on hisown behalf. 6. Skin/Wound Care:Routine pressure relief measures 7. Fluids/Electrolytes/Nutrition:  Continue to encourage, p.o. continues to be variable/poor 8. CAD: On Lipitor and Plavix --no BB due to intermittent pauses.  9. T2DM with neuropathy: Monitor BS ac/hs. Was on metformin, Glipizide and Lantus at home.   -resumed metformin bid for meal coverage, increased to 850 on 7/3. AM CBG low and creat elevated will d/c am monitor needs fluids  -increase lantus to 20 on 7/2, increased to 22 on 7/11 CBG (last 3)  Recent Labs    06/25/19 1703 06/25/19 2141 06/26/19 0627  GLUCAP 120* 105* 89   Slightly labile, lower today, hold metformin related to elevated creatinine, will likely not restart metformin and try to manage blood sugars with glipizide and Lantus 10.Hypoxia: Has been weaned off oxygen. Continue to monitor for recurrent fevers. 11. LUL pulmonary nodule: follow up CT in 6-12 months 12.  Right knee pain:  Improved 13.  Hypokalemia  Potassium 3.1 on 7/17 s/p KCl 10 mEq IV x4  Labs ordered for tomorrow, continue 20 mEq KCl twice daily  15.  History of hypertension Vitals:   06/25/19 2142 06/26/19 0620  BP: 107/61 127/68  Pulse: 91 79  Resp: 18 18  Temp: 98.7 F (37.1 C) 98 F (36.7  C)  SpO2: 97% 97%   Running lower, needs fluids will hold ARB d/t elevated creat, will restart Cozaar once patient is hydrated, blood pressures are normal 16.  Post stroke dysphagia  On D2 thins, advance diet as tolerated  See #7  LOS: 19 days A FACE TO [place] E  06/26/2019, 10:52 AM

## 2019-06-27 ENCOUNTER — Inpatient Hospital Stay (HOSPITAL_COMMUNITY): Payer: Medicare HMO

## 2019-06-27 ENCOUNTER — Inpatient Hospital Stay (HOSPITAL_COMMUNITY): Payer: Medicare HMO | Admitting: Speech Pathology

## 2019-06-27 LAB — GLUCOSE, CAPILLARY
Glucose-Capillary: 105 mg/dL — ABNORMAL HIGH (ref 70–99)
Glucose-Capillary: 127 mg/dL — ABNORMAL HIGH (ref 70–99)
Glucose-Capillary: 194 mg/dL — ABNORMAL HIGH (ref 70–99)
Glucose-Capillary: 105 mg/dL — ABNORMAL HIGH (ref 70–99)

## 2019-06-27 NOTE — Progress Notes (Signed)
Speech Language Pathology Daily Session Note  Patient Details  Name: Luke Rivera MRN: [id number] Date of Birth: 05-08-1941  Today's Date: 06/27/2019 SLP Individual Time: 1135-1205 SLP Individual Time Calculation (min): 30 min  Short Term Goals: Week 3: SLP Short Term Goal 1 (Week 3): Pt will consume current diet with supervision A verbal cues for swallow strategies (lingual sweeps to clear oral residue.) SLP Short Term Goal 2 (Week 3): Pt will express wants/needs in response to yes/no questions and at phrase level with min A verbal cues for syntactic organization and word finding. SLP Short Term Goal 3 (Week 3): Pt will comprehend complex aduitory information with min A verbal cues.  Skilled Therapeutic Interventions:  Pt was seen for skilled ST targeting goals for expressive communication.  During informal conversations with pt and his wife, pt was able to relay novel information regarding changes to unit procedures to his wife at the short phrase level as he was concerned they would impact her ability to bring him food outside the hospital.  SLP facilitated the session with picture description tasks to address expansion of utterances beyond the phrase level.  Pt needed mod verbal cues to recognize omissions of functor or subject words when expanding responses to the sentence level but was min assist for production of appropriate content at the phrase level to describe actions in images.  Pt was left in his wheelchair with wife at bedside.  Continue per current plan of care.    Pain Pain Assessment Pain Scale: 0-10 Pain Score: 0-No pain  Therapy/Group: Individual Therapy  Noelani Harbach, Selinda Orion 06/27/2019, 3:36 PM

## 2019-06-27 NOTE — Progress Notes (Addendum)
[facility] PHYSICAL MEDICINE & REHABILITATION PROGRESS NOTE   Subjective/Complaints:  Patient has difficulty sleeping with his IV fluids, he voids frequently.  Patient states he is drinking better, intake thus far was 240 mL today  ROS: Inaccurate with yes no  Objective:   No results found. No results for input(s): WBC, HGB, HCT, PLT in the last 72 hours. Recent Labs    06/25/19 1529 06/26/19 1105  NA 137 137  K 3.1* 3.6  CL 102 105  CO2 23 20*  GLUCOSE 172* 117*  BUN 24* 21  CREATININE 2.06* 1.36*  CALCIUM 9.0 9.0    Intake/Output Summary (Last 24 hours) at 06/27/2019 1050 Last data filed at 06/27/2019 0804 Gross per 24 hour  Intake 1062 ml  Output 950 ml  Net 112 ml     Physical Exam: Vital Signs Blood pressure 139/66, pulse 83, temperature 97.8 F (36.6 C), temperature source Oral, resp. rate 20, height 6' (1.829 m), weight 106.8 kg, SpO2 97 %.  Constitutional: No distress . Vital signs reviewed. HENT: Normocephalic.  Atraumatic. Eyes: EOMI.  No discharge. Cardiovascular: No JVD. Respiratory: Normal effort. GI: Non-distended. Musc: No edema or tenderness in extremities. Neurological: Alert Expressive aphasia, able to communicate at the phrase level but still has inaccuracies with yes no Dysarthria Motor: RUE 2+/5 proximal distal, unchanged RLE 3+/5 proximal to distal, unchanged Skin: Warm and dry.  Intact. Psychiatric: Unable to assess due to cognition  Assessment/Plan: 1. 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  Physiatrist is providing close team supervision and 24 hour management of active medical problems listed below.  Physiatrist and rehab team continue to assess barriers to discharge/monitor patient progress toward functional and medical goals  Care Tool:  Bathing    Body parts bathed by patient: Right arm, Chest, Abdomen, Front  perineal area, Buttocks, Right upper leg, Left upper leg, Face, Right lower leg   Body parts bathed by helper: Left lower leg, Left arm Body parts n/a: Right arm, Left arm, Chest, Abdomen, Right upper leg, Left upper leg, Right lower leg, Left lower leg(Pt did not attempt this session.)   Bathing assist Assist Level: Moderate Assistance - Patient 50 - 74%     Upper Body Dressing/Undressing Upper body dressing   What is the patient wearing?: Pull over shirt    Upper body assist Assist Level: Moderate Assistance - Patient 50 - 74%    Lower Body Dressing/Undressing Lower body dressing      What is the patient wearing?: Incontinence brief, Pants     Lower body assist Assist for lower body dressing: Moderate Assistance - Patient 50 - 74%     Toileting Toileting    Toileting assist Assist for toileting: Moderate Assistance - Patient 50 - 74%     Transfers Chair/bed transfer  Transfers assist     Chair/bed transfer assist level: Minimal Assistance - Patient > 75%     Locomotion Ambulation   Ambulation assist   Ambulation activity did not occur: Safety/medical concerns  Assist level: Minimal Assistance - Patient > 75% Assistive device: Walker-rolling Max distance: 78 ft   Walk 10 feet activity   Assist  Walk 10 feet activity did not occur: Safety/medical concerns  Assist level: Minimal Assistance - Patient > 75% Assistive device: Walker-rolling   Walk 50 feet activity   Assist Walk 50 feet with 2 turns activity did not occur: Safety/medical concerns  Assist  level: Minimal Assistance - Patient > 75% Assistive device: Walker-rolling    Walk 150 feet activity   Assist Walk 150 feet activity did not occur: Safety/medical concerns         Walk 10 feet on uneven surface  activity   Assist Walk 10 feet on uneven surfaces activity did not occur: Safety/medical concerns         Wheelchair     Assist Will patient use wheelchair at discharge?:  Yes(TBD) Type of Wheelchair: Manual Wheelchair activity did not occur: Safety/medical concerns(Per report )  Wheelchair assist level: Moderate Assistance - Patient 50 - 74% Max wheelchair distance: 28'    Wheelchair 50 feet with 2 turns activity    Assist        Assist Level: Moderate Assistance - Patient 50 - 74%   Wheelchair 150 feet activity     Assist          Medical Problem List and Plan: 1.Functional and cognitive deficitssecondary to left MCA infarct  Continue CIR 2. Antithrombotics: -DVT/anticoagulation:Pharmaceutical:Other (comment)--Eliquis -antiplatelet therapy: Plavix 3. Pain Management:tylenol prn 4. Mood:LCSW to follow for evaluation and support. -antipsychotic agents: N/A 5. Neuropsych: This patientis notcapable of making decisions on hisown behalf. 6. Skin/Wound Care:Routine pressure relief measures 7. Fluids/Electrolytes/Nutrition:  Continue to encourage, p.o. continues to be variable/poor Will stop IV fluids and recheck BUN/creatinine in a.m. 8. CAD: On Lipitor and Plavix --no BB due to intermittent pauses.  9. T2DM with neuropathy: Monitor BS ac/hs. Was on metformin, Glipizide and Lantus at home.   -resumed metformin bid for meal coverage, increased to 850 on 7/3. AM CBG low and creat elevated will d/c am monitor needs fluids  -increase lantus to 20 on 7/2, increased to 22 on 7/11 CBG (last 3)  Recent Labs    06/26/19 1658 06/26/19 2122 06/27/19 0615  GLUCAP 122* 129* 105*   Controlled, off metformin try to manage blood sugars with glipizide and Lantus 10.Hypoxia: Has been weaned off oxygen. Continue to monitor for recurrent fevers. 11. LUL pulmonary nodule: follow up CT in 6-12 months 12.  Right knee pain:  Improved 13.  Hypokalemia  Potassium 3.1 on 7/17 s/p KCl 10 mEq IV x4  Labs ordered for tomorrow, continue 20 mEq KCl twice daily May discontinue IV fluids  15.  History of  hypertension Vitals:   06/26/19 1927 06/27/19 0352  BP: (!) 128/56 139/66  Pulse: 95 83  Resp: 18 20  Temp: 98.5 F (36.9 C) 97.8 F (36.6 C)  SpO2: 97% 97%   At this point blood pressure is in a desired range, may not be able to take enough p.o. 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

## 2019-06-27 NOTE — Progress Notes (Signed)
Physical Therapy Session Note  Patient Details  Name: Luke Rivera MRN: [id number] Date of Birth: 09/19/1941  Today's Date: 06/27/2019 PT Individual Time: 1696-7893 PT Individual Time Calculation (min): 60 min   Short Term Goals: Week 3:  PT Short Term Goal 1 (Week 3): STG=LTG  Skilled Therapeutic Interventions/Progress Updates:    Pt seated in w/c upon PT arrival, agreeable to therapy tx and denies pain. Therapist donned shoes and R AFO for time management. Pt transported to the gym Pt participated in body weight supported treadmill training this session with use of the litegait system. Pt performed sit<>stand from w/c with RW and min assist in order to don harness. Pt stepped on/off treadmill this session with mod +2 assist. Pt performed treadmill training as follows: Trial 1- 2:19 minutes at 0.5-0.6 mph x117 ft with manual facilitation for knee flexion during R swing phase and assist to control R knee hyperextension during stance phase, B UE support.  Trial 2- 2:29 min at 0.5 mph x104 ft with verbal cues for increased L step length and manual facilitation for knee flexion during R swing phase, B UE support. Within litegait harness pt also worked on R stance control and increased L step length while stepping in place x 10 steps. Pt performed x 10 R LE active assisted hamstring curls for neuro re-ed and strengthening. Pt reports having to use the bathroom. Pt ambulated backwards x 2 ft on treadmill to sit directly into chair mod assist +2. Pt transported back to room in w/c to go to the bathroom. Pt ambulated from w/c<>toilet this session with RW and min assist 2 x 10 ft. Therapist assisted with clothing management and [employer]. Pt washed hands at the sink with supervision. Pt ambulated x 100 ft in the hallway with min assist and RW, increased R toe drag with fatigue. Pt left supine in bed with needs in reach and chair alarm set.   Therapy Documentation Precautions:  Precautions Precautions:  Fall Precaution Comments: expressive difficulty Restrictions Weight Bearing Restrictions: No    Therapy/Group: Individual Therapy  Netta Corrigan, PT, DPT 06/27/2019, 7:50 AM

## 2019-06-28 ENCOUNTER — Inpatient Hospital Stay (HOSPITAL_COMMUNITY): Payer: Medicare HMO

## 2019-06-28 ENCOUNTER — Inpatient Hospital Stay (HOSPITAL_COMMUNITY): Payer: Medicare HMO | Admitting: Occupational Therapy

## 2019-06-28 ENCOUNTER — Inpatient Hospital Stay (HOSPITAL_COMMUNITY): Payer: Medicare HMO | Admitting: Psychology

## 2019-06-28 LAB — BASIC METABOLIC PANEL
Anion gap: 10 (ref 5–15)
BUN: 14 mg/dL (ref 8–23)
CO2: 24 mmol/L (ref 22–32)
Calcium: 8.6 mg/dL — ABNORMAL LOW (ref 8.9–10.3)
Chloride: 108 mmol/L (ref 98–111)
Creatinine, Ser: 0.92 mg/dL (ref 0.61–1.24)
GFR calc Af Amer: >60 mL/min (ref >60)
GFR calc non Af Amer: >60 mL/min (ref >60)
Glucose, Bld: 124 mg/dL — ABNORMAL HIGH (ref 70–99)
Potassium: 3.3 mmol/L — ABNORMAL LOW (ref 3.5–5.1)
Sodium: 142 mmol/L (ref 135–145)

## 2019-06-28 LAB — GLUCOSE, CAPILLARY
Glucose-Capillary: 108 mg/dL — ABNORMAL HIGH (ref 70–99)
Glucose-Capillary: 205 mg/dL — ABNORMAL HIGH (ref 70–99)
Glucose-Capillary: 88 mg/dL (ref 70–99)
Glucose-Capillary: 222 mg/dL — ABNORMAL HIGH (ref 70–99)

## 2019-06-28 LAB — CBC
HCT: 37.7 % — ABNORMAL LOW (ref 39.0–52.0)
Hemoglobin: 12.6 g/dL — ABNORMAL LOW (ref 13.0–17.0)
MCH: 28.6 pg (ref 26.0–34.0)
MCHC: 33.4 g/dL (ref 30.0–36.0)
MCV: 85.5 fL (ref 80.0–100.0)
Platelets: 270 10*3/uL (ref 150–400)
RBC: 4.41 MIL/uL (ref 4.22–5.81)
RDW: 15.6 % — ABNORMAL HIGH (ref 11.5–15.5)
WBC: 5.3 10*3/uL (ref 4.0–10.5)
nRBC: 0 % (ref 0.0–0.2)

## 2019-06-28 MED ORDER — ADULT MULTIVITAMIN W/MINERALS CH
1.0000 | ORAL_TABLET | Freq: Every day | ORAL | Status: DC
  Administered 2019-06-29 – 2019-07-02 (×4): 1 via ORAL
  Filled 2019-06-28 (×4): qty 1

## 2019-06-28 NOTE — Progress Notes (Signed)
[facility] PHYSICAL MEDICINE & REHABILITATION PROGRESS NOTE   Subjective/Complaints:  Off IVF last noc , reviewed labs normalized   ROS: Inaccurate with yes no  Objective:   No results found. Recent Labs    06/28/19 0709  WBC 5.3  HGB 12.6*  HCT 37.7*  PLT 270   Recent Labs    06/26/19 1105 06/28/19 0709  NA 137 142  K 3.6 3.3*  CL 105 108  CO2 20* 24  GLUCOSE 117* 124*  BUN 21 14  CREATININE 1.36* 0.92  CALCIUM 9.0 8.6*    Intake/Output Summary (Last 24 hours) at 06/28/2019 0851 Last data filed at 06/28/2019 0600 Gross per 24 hour  Intake 480 ml  Output 325 ml  Net 155 ml     Physical Exam: Vital Signs Blood pressure 119/73, pulse 63, temperature 97.7 F (36.5 C), temperature source Oral, resp. rate 19, height 6' (1.829 m), weight 109.3 kg, SpO2 97 %.  Constitutional: No distress . Vital signs reviewed. HENT: Normocephalic.  Atraumatic. Eyes: EOMI.  No discharge. Cardiovascular: No JVD. Respiratory: Normal effort. GI: Non-distended. Musc: No edema or tenderness in extremities. Neurological: Alert Expressive aphasia, able to communicate at the phrase level but still has inaccuracies with yes no Dysarthria Motor: RUE 2+/5 proximal distal, unchanged RLE 3+/5 proximal to distal, unchanged Skin: Warm and dry.  Intact. Psychiatric: Unable to assess due to cognition  Assessment/Plan: 1. 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  Physiatrist is providing close team supervision and 24 hour management of active medical problems listed below.  Physiatrist and rehab team continue to assess barriers to discharge/monitor patient progress toward functional and medical goals  Care Tool:  Bathing    Body parts bathed by patient: Right arm, Chest, Abdomen, Front perineal area, Buttocks, Right upper leg, Left upper leg, Face, Right lower leg   Body  parts bathed by helper: Left lower leg, Left arm Body parts n/a: Right arm, Left arm, Chest, Abdomen, Right upper leg, Left upper leg, Right lower leg, Left lower leg(Pt did not attempt this session.)   Bathing assist Assist Level: Moderate Assistance - Patient 50 - 74%     Upper Body Dressing/Undressing Upper body dressing   What is the patient wearing?: Pull over shirt    Upper body assist Assist Level: Moderate Assistance - Patient 50 - 74%    Lower Body Dressing/Undressing Lower body dressing      What is the patient wearing?: Incontinence brief, Pants     Lower body assist Assist for lower body dressing: Moderate Assistance - Patient 50 - 74%     Toileting Toileting    Toileting assist Assist for toileting: Moderate Assistance - Patient 50 - 74%     Transfers Chair/bed transfer  Transfers assist     Chair/bed transfer assist level: Minimal Assistance - Patient > 75%     Locomotion Ambulation   Ambulation assist   Ambulation activity did not occur: Safety/medical concerns  Assist level: Minimal Assistance - Patient > 75% Assistive device: Walker-rolling Max distance: 10 ft   Walk 10 feet activity   Assist  Walk 10 feet activity did not occur: Safety/medical concerns  Assist level: Minimal Assistance - Patient > 75% Assistive device: Walker-rolling   Walk 50 feet activity   Assist Walk 50 feet with 2 turns activity did not occur: Safety/medical concerns  Assist level: Minimal Assistance - Patient > 75% Assistive  device: Walker-rolling    Walk 150 feet activity   Assist Walk 150 feet activity did not occur: Safety/medical concerns         Walk 10 feet on uneven surface  activity   Assist Walk 10 feet on uneven surfaces activity did not occur: Safety/medical concerns         Wheelchair     Assist Will patient use wheelchair at discharge?: Yes(TBD) Type of Wheelchair: Manual Wheelchair activity did not occur: Safety/medical  concerns(Per report )  Wheelchair assist level: Moderate Assistance - Patient 50 - 74% Max wheelchair distance: 26'    Wheelchair 50 feet with 2 turns activity    Assist        Assist Level: Moderate Assistance - Patient 50 - 74%   Wheelchair 150 feet activity     Assist          Medical Problem List and Plan: 1.Functional and cognitive deficitssecondary to left MCA infarct  Continue CIR plan d/c 7/24 2. Antithrombotics: -DVT/anticoagulation:Pharmaceutical:Other (comment)--Eliquis -antiplatelet therapy: Plavix 3. Pain Management:tylenol prn 4. Mood:LCSW to follow for evaluation and support. -antipsychotic agents: N/A 5. Neuropsych: This patientis notcapable of making decisions on hisown behalf. 6. Skin/Wound Care:Routine pressure relief measures 7. Fluids/Electrolytes/Nutrition:  Continue to encourage, p.o. continues to be variable/poor Improving  8. CAD: On Lipitor and Plavix --no BB due to intermittent pauses.  9. T2DM with neuropathy: Monitor BS ac/hs. Was on metformin, Glipizide and Lantus at home.   -resumed metformin bid for meal coverage, increased to 850 on 7/3. AM CBG low and creat elevated will d/c am monitor needs fluids  -increase lantus to 20 on 7/2, increased to 22 on 7/11 CBG (last 3)  Recent Labs    06/27/19 1642 06/27/19 2122 06/28/19 0605  GLUCAP 194* 105* 108*   Controlled 7/20 , off metformin try to manage blood sugars with glipizide and Lantus 10.Hypoxia: Has been weaned off oxygen. Continue to monitor for recurrent fevers. 11. LUL pulmonary nodule: follow up CT in 6-12 months 12.  Right knee pain:  Improved 13.  Hypokalemia   resolved cont oral supplementation  15.  History of hypertension Vitals:   06/27/19 2023 06/28/19 0531  BP: 134/79 119/73  Pulse: 83 63  Resp: 19 19  Temp: 98.2 F (36.8 C) 97.7 F (36.5 C)  SpO2: 97% 97%   At this point blood pressure is in a desired range, may not  be able to take enough p.o. 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

## 2019-06-28 NOTE — Progress Notes (Signed)
Physical Therapy Session Note  Patient Details  Name: Luke Rivera MRN: [id number] Date of Birth: 11-02-1941  Today's Date: 06/28/2019 PT Individual Time: 0102-7253 PT Individual Time Calculation (min): 84 min   Short Term Goals: Week 3:  PT Short Term Goal 1 (Week 3): STG=LTG  Skilled Therapeutic Interventions/Progress Updates:    Pt seated in w/c upon PT arrival, agreeable to therapy tx and denies pain. Pt transported to the gym. Stand pivot to the mat with RW and CGA. Therapist donned R knee cage for hyperextension control. Pt ambulated 2 x 65 ft with RW and min assist, the first trial using knee cage and the second trial without the knee cage, cues for increased step length and upright posture. Pt worked on ascending/descending 4 inch platform step in order to simulate home entry, therapist demonstrated techniques to perform and then pt ascended/descended 4 inch step with RW and min assist, cues for techniques, pt then continued to ambulate x 30 ft to the mat with min assist and RW. Pt worked on standing balance without UE support to perform horseshoe toss activity x2 trials with CGA and using R UE to throw for neuro re-ed. Pt performed overhead reaching task in standing with L UE to facilitate increased trunk extension and upright posture, CGA for standing balance. Pt worked on R UE neuro re-ed while standing to perform clothes pin reaching task, therapist providing UE support during this to minimize shoulder hiking. Pt worked on standing balance and gait with turns in order to ambulate while weaving through cones, x 2 trials with min assist and cues for RW management. Pt worked on standing balance and R LE strength to performed 2 x 10 toe taps per LE on aerobic step with UE support on RW, min assist and cues for techniques. Pt worked on LE strength and knee control to perform 2 x 10 mini squats without UE support, min assist and cues for techniques. Pt ambulated x 20 ft back to w/c with RW and min  assist, transported back to room. Pt ambulated x 5 ft from w/c>bed with RW and min assist, requesting to get in bed and lay down. Sit>supine with min assist, therapist doffed shoes and AFO. Pt left supine in bed with needs in reach and bed alarm set.   Therapy Documentation Precautions:  Precautions Precautions: Fall Precaution Comments: expressive difficulty Restrictions Weight Bearing Restrictions: No    Therapy/Group: Individual Therapy  Netta Corrigan, PT, DPT 06/28/2019, 7:59 AM

## 2019-06-28 NOTE — Progress Notes (Signed)
Occupational Therapy Session Note  Patient Details  Name: Luke Rivera MRN: [id number] Date of Birth: [birth year]  Today's Date: 06/28/2019 OT Individual Time: 1100-1159 OT Individual Time Calculation (min): 59 min    Short Term Goals: Week 3:  OT Short Term Goal 1 (Week 3): Continue working on establshed min assist level goals.  Skilled Therapeutic Interventions/Progress Updates:    Pt completed shower and dressing during session with spouse present for education.  Mod assist for stand pivot transfer to the walk-in shower with use of the grab bars and therapist assist.  He was able to use the wash mit on the right hand after placement with min assist for washing 75% of his body.  Washcloth was then used for washing the RUE and for standing to wash his buttocks in standing.  Min assist for sit to stand and standing balance during bathing and dressing.  Pt completed stand pivot without assistive device with mod assist back to the wheelchair.  He then completed dressing tasks at the sink with min assist for donning pullover shirt and mod assist for donning brief and pants.  Max assist for socks and shoes overall, with pt demonstrating ability to donn the left shoe but not tie it.  Therapist provided total assist for the right shoe and AFO.  Pt left in wheelchair with spouse present at end of session and nursing in the room as well.  Belt alarm in place and call button and phone in reach.      Therapy Documentation Precautions:  Precautions Precautions: Fall Precaution Comments: expressive difficulty Restrictions Weight Bearing Restrictions: No   Pain: Pain Assessment Pain Scale: Faces Pain Score: 0-No pain ADL: See Care Tool Section for some details of ADL  Therapy/Group: Individual Therapy  Carlyne Keehan OTR/L 06/28/2019, 12:17 PM

## 2019-06-28 NOTE — Progress Notes (Signed)
Speech Language Pathology Daily Session Note  Patient Details  Name: Luke Rivera MRN: [id number] Date of Birth: [birth year]  Today's Date: 06/28/2019 SLP Individual Time: 0903-1000 SLP Individual Time Calculation (min): 57 min  Short Term Goals: Week 3: SLP Short Term Goal 1 (Week 3): Pt will consume current diet with supervision A verbal cues for swallow strategies (lingual sweeps to clear oral residue.) SLP Short Term Goal 2 (Week 3): Pt will express wants/needs in response to yes/no questions and at phrase level with min A verbal cues for syntactic organization and word finding. SLP Short Term Goal 3 (Week 3): Pt will comprehend complex aduitory information with min A verbal cues.  Skilled Therapeutic Interventions: Skilled ST services focused on swallow and speech skills. SLP facilitated production and reading at phrase and sentence level in picture description tasks, pt required min A verbal cues for syntactic structure at phrase level (omission of articles and compounds) with supervision A semantic cues for word finding. Pt demonstrated ability to produce appropriate syntactic at sentence level in 3 out 5 opportunities and given mod A verbal cues in 5 out 5 opportunities, with min A verbal cues for intelligibility. Pt demonstrated ability to read self-created thoughts at phrase level and at sentence level mod I. Pt demonstrated word finding skills in communication barrier task with min A semantic cues and created appropriate syntactic sentence structure in 2 out 6 opportunities, the remainder pt produced at phrase level. SLP also facilitated PO consumption of regular textured snack trial, pt demonstrated timely mastication and appropriate oral clearance. SLP upgraded diet to dys 3 and will assess PO consumption of regular tray during services to determine tolerance piror to upgrade. Pt was left in room with call bell within reach and chair alarm set. ST recommends to continue skilled ST services.       Pain Pain Assessment Pain Scale: Faces Pain Score: 0-No pain  Therapy/Group: Individual Therapy  Luda Charbonneau  [facility] 06/28/2019, 2:13 PM

## 2019-06-29 ENCOUNTER — Inpatient Hospital Stay (HOSPITAL_COMMUNITY): Payer: Medicare HMO

## 2019-06-29 ENCOUNTER — Inpatient Hospital Stay (HOSPITAL_COMMUNITY): Payer: Medicare HMO | Admitting: Occupational Therapy

## 2019-06-29 LAB — GLUCOSE, CAPILLARY
Glucose-Capillary: 124 mg/dL — ABNORMAL HIGH (ref 70–99)
Glucose-Capillary: 129 mg/dL — ABNORMAL HIGH (ref 70–99)
Glucose-Capillary: 126 mg/dL — ABNORMAL HIGH (ref 70–99)
Glucose-Capillary: 202 mg/dL — ABNORMAL HIGH (ref 70–99)

## 2019-06-29 MED ORDER — POTASSIUM CHLORIDE CRYS ER 20 MEQ PO TBCR
30.0000 meq | EXTENDED_RELEASE_TABLET | Freq: Two times a day (BID) | ORAL | Status: DC
  Administered 2019-06-29 – 2019-07-02 (×6): 30 meq via ORAL
  Filled 2019-06-29 (×6): qty 1

## 2019-06-29 MED ORDER — DICLOFENAC SODIUM 1 % TD GEL: 2.0000 g | Freq: Four times a day (QID) | TRANSDERMAL | 1 refills | Status: AC

## 2019-06-29 MED ORDER — CLOPIDOGREL BISULFATE 75 MG PO TABS: 75.0000 mg | ORAL_TABLET | Freq: Every day | ORAL | 1 refills | Status: AC

## 2019-06-29 NOTE — Progress Notes (Signed)
Physical Therapy Session Note  Patient Details  Name: Luke Rivera MRN: [id number] Date of Birth: 01/10/1941  Today's Date: 06/29/2019 PT Individual Time: 6606-3016 PT Individual Time Calculation (min): 73 min   Short Term Goals: Week 3:  PT Short Term Goal 1 (Week 3): STG=LTG  Skilled Therapeutic Interventions/Progress Updates:    Pt seated in w/c upon PT arrival, agreeable to therapy tx and denies pain. [name] present this session for family education and hands-on family training. Therapist, pt and his wife discussed d/c planning and equipment that will be provided upon d/c including a w/c. Pt transported to the gym in w/c. Pt already has R AFO donned and therapist donned R knee cage for hyperextension control. Therapist educated pt's wife on how to don/doff knee cage and is recommending use of knee cage for longer distance ambulation within the house. Pt ambulated x 60 ft with CGA from therapist, therapist educating pt's wife on guarding techniques and cues to provide. Pt then ambulated x 65 ft with CGA from wife and using RW. Pt ascended/descended 4 inch platform step x 1 with assist from therapist and x1 with assist from pt's wife, min assist with RW and therapist educating on techniques/cues to provide. Pt transported to the ortho gym and performed car transfer this session. Pt reports having a sedan at home which is lower to get up from. Pt ambulated x 10 ft to the car and performed stand pivot with RW and CGA to sit in the car, however requiring mod assist to stand from low car seat height. [name] reports that pt's daughter will also be present to assist with car transfer on d/c. Pt transported back to room in w/c. Pt ambulated x 10 ft to the bed with RW and assist from wife, transferred to bed and to supine with min assist. Pt left with needs in reach and bed alarm set. Will continue family education during [name] session.   Therapy Documentation Precautions:   Precautions Precautions: Fall Precaution Comments: expressive difficulty Restrictions Weight Bearing Restrictions: No   Therapy/Group: Individual Therapy  Netta Corrigan, PT, DPT 06/29/2019, 7:57 AM

## 2019-06-29 NOTE — Plan of Care (Signed)
  Problem: RH Dressing Goal: LTG Patient will perform upper body dressing (OT) Description: LTG Patient will perform upper body dressing with assist, with/without cues (OT). Flowsheets (Taken 06/29/2019 1647) LTG: Pt will perform upper body dressing with assistance level of: (Goal downgraded based on progress) Minimal Assistance - Patient > 75%   Problem: RH Dressing Goal: LTG Patient will perform lower body dressing w/assist (OT) Description: LTG: Patient will perform lower body dressing with assist, with/without cues in positioning using equipment (OT) Flowsheets (Taken 06/29/2019 1647) LTG: Pt will perform lower body dressing with assistance level of: (Goal downgraded based on progress) Moderate Assistance - Patient 50 - 74%   Problem: RH Toileting Goal: LTG Patient will perform toileting task (3/3 steps) with assistance level (OT) Description: LTG: Patient will perform toileting task (3/3 steps) with assistance level (OT)  Flowsheets (Taken 06/29/2019 1647) LTG: Pt will perform toileting task (3/3 steps) with assistance level: (goal downgraded based on progress) Moderate Assistance - Patient 50 - 74%

## 2019-06-29 NOTE — Progress Notes (Signed)
Nutrition Follow-up  RD working remotely.  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  Ongoing, being addressed via oral nutrition supplements  GOAL:   Patient will meet greater than or equal to 90% of their needs  Progressing  MONITOR:   PO intake, Supplement acceptance, Diet advancement, Weight trends, Labs  REASON FOR ASSESSMENT:   Consult Poor PO, Enteral/tube feeding initiation and management  ASSESSMENT:   78 year old male with PMH of CAD/CAF, DVT, TIA, multiple back surgeries with limited mobility, STM deficits, and T2DM with peripheral neuropathy. Pt was admitted on 06/22 with garbled speech and right-sided weakness. 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 2D echo showed EF 55-60 % without wall abnormalities. Pt admitted to CIR on 6/29.  7/20 - diet advanced to Dysphagia 3  Noted target d/c date of 7/24.  Spoke with pt briefly via phone call. Pt  reports that he is eating "some better" then handed the phone over to his wife Luke Rivera.  Luke Rivera endorses pt's increased PO intake and states it is because she is bringing him whatever he wants from home or restaurants. For example, she brought pt chicken and dumplings from [employer] for lunch today and "he is eating all of that." She also reports he consumed some of the hospital food last night "on top of what I already brought him."  [name] believes that pt's diet upgrade to Dysphagia 3 has helped him increase his PO intake. [name] is very appreciative of all the work that providers have done to help pt.  Overall, weight down 17 lbs since admission to CIR but is stable compared to weight on last RD visit 1 week ago.  Reviewed RN edema assessment. Pt with non-pitting edema to RUE and RLE.  Meal Completion: 0-100% x last 8 recorded meals (extremely varied, averaging 41%)  Medications reviewed and include: Ensure Enlive TID, SSI, Lantus 22 units daily, magic mouthwash, MVI with minerals, Protonix, K-dur 20 mEq BID  Labs reviewed: potassium 3.3 CBG's: 88-222 x 24 hours  UOP: 725 ml x 24 hours  Diet Order:   Diet Order            DIET DYS 3 Room service appropriate? Yes; Fluid consistency: Thin  Diet effective now              EDUCATION NEEDS:   Not appropriate for education at this time  Skin:  Skin Assessment: Reviewed RN Assessment (MASD to bilateral buttocks, bilateral groin)  Last BM:  06/28/19 large type 7  Height:   Ht Readings from Last 1 Encounters:  06/26/19 6' (1.829 m)    Weight:   Wt Readings from Last 1 Encounters:  06/29/19 108.8 kg    Ideal Body Weight:  80.9 kg  BMI:  Body mass index is 32.53 kg/m.  Estimated Nutritional Needs:   Kcal:  1900-2100  Protein:  95-110 grams  Fluid:  >/= 1.9 L    Luke Face, MS, RD, LDN Inpatient Clinical Dietitian Pager: [phone] Weekend/After Hours: [phone]

## 2019-06-29 NOTE — Progress Notes (Signed)
Occupational Therapy Session Note  Patient Details  Name: Luke Rivera MRN: [id number] Date of Birth: [birth year]  Today's Date: 06/29/2019 OT Individual Time: 1000-1030 OT Individual Time Calculation (min): 30 min    Short Term Goals: Week 1:  OT Short Term Goal 1 (Week 1): Pt will be able to sit to stand with mod A of 1 to prepare for toileting tasks. OT Short Term Goal 1 - Progress (Week 1): Met OT Short Term Goal 2 (Week 1): Pt will be able to transfer to [facility] and/ or toilet with mod A of 1. OT Short Term Goal 2 - Progress (Week 1): Progressing toward goal OT Short Term Goal 3 (Week 1): Pt will be able to don shirt with min A. OT Short Term Goal 3 - Progress (Week 1): Met OT Short Term Goal 4 (Week 1): Pt will use RUE with mod A to bathe L arm. OT Short Term Goal 4 - Progress (Week 1): Met OT Short Term Goal 5 (Week 1): Pt will don pants with max A. OT Short Term Goal 5 - Progress (Week 1): Met Week 2:  OT Short Term Goal 1 (Week 2): Pt will be able to consistently transfer to toilet with mod A of 1. OT Short Term Goal 1 - Progress (Week 2): Met OT Short Term Goal 2 (Week 2): Pt will be able to sit to stand with grab bar with min A during toileting/ LB dressing. OT Short Term Goal 2 - Progress (Week 2): Not met OT Short Term Goal 3 (Week 2): Pt will demonstrate improved RUE awareness during UB dressing to fully don shirt over R arm with only min verbal/ visual cues vs. mod VC. OT Short Term Goal 3 - Progress (Week 2): Met OT Short Term Goal 4 (Week 2): Pt will be able to actively hold wash cloth in R hand to wash R thigh. OT Short Term Goal 4 - Progress (Week 2): Not met OT Short Term Goal 5 (Week 2): Pt will be able to maintain static stand for 1 minute versus 20 - 30 seconds during LB self care. OT Short Term Goal 5 - Progress (Week 2): Met Week 3:  OT Short Term Goal 1 (Week 3): Continue working on establshed min assist level goals.  Skilled Therapeutic Interventions/Progress  Updates:    Pt participated very well this session.  He worked on sit to stand from [employer] using L hand on elevated bed rail with close S.  In standing he worked on a/arom for shoulder flexion to 120 with hands on position to keep arm in neutral as he pulls into pronation. Then placed R hand on back of arm chair placed perpendicular to him and worked on pushing his hand back and forth in sh flex/ext with wt bearing.    In sitting, coordination of elbow/wrist to bring cup to mouth working on isolated movements.  Isolated finger coordination with touching thumb to each finger.   Bimanual grasp and moving hands side to side and to alternating shoulders.  Pt resting in w/c with belt alarm on.  All needs met.  Therapy Documentation Precautions:  Precautions Precautions: Fall Precaution Comments: expressive difficulty Restrictions Weight Bearing Restrictions: No    Vital Signs: Therapy Vitals Temp: 97.9 F (36.6 C) Temp Source: Oral Pulse Rate: (!) 54 Resp: 12 BP: (!) 131/57 Patient Position (if appropriate): Lying Oxygen Therapy SpO2: 96 % O2 Device: Room Air Pain:     Therapy/Group: Individual Therapy  [place] 06/29/2019, 8:50  AM

## 2019-06-29 NOTE — Progress Notes (Signed)
Speech Language Pathology Daily Session Note  Patient Details  Name: Luke Rivera MRN: [id number] Date of Birth: [birth year]  Today's Date: 06/29/2019 SLP Individual Time: 0900-1000 SLP Individual Time Calculation (min): 60 min  Short Term Goals: Week 3: SLP Short Term Goal 1 (Week 3): Pt will consume current diet with supervision A verbal cues for swallow strategies (lingual sweeps to clear oral residue.) SLP Short Term Goal 2 (Week 3): Pt will express wants/needs in response to yes/no questions and at phrase level with min A verbal cues for syntactic organization and word finding. SLP Short Term Goal 3 (Week 3): Pt will comprehend complex aduitory information with min A verbal cues.  Skilled Therapeutic Interventions: Skilled ST services focused on language skills. SLP facilitated comprehension of paragraphs in response to yes/no questions with accuracy in 27 out 30 opportunities, deficits appeared likely due to recall compared to comprehension. Pt demonstrated reading comprehension at sentence level matching correct sentence form a field of 3 to picture in 10 out 10 opportunities and reading complex sentence with response at phrase level with min A verbal cues for syntax organization. Pt required min A verbal cues to decode complex sentence and demonstrated self-correction of verbal errors. Pt demonstrated ability to follow 2-3 complex language commands (before/after, first/last, right/left) with min A verbal cues. SLP also facilitated word finding skills in naming a word given defintion in 10 out 10 opportunities and required min A semantic cues to name item given limited description (3 words.) Pt was left in room with call bell within reach and chair alarm set. ST recommends to continue skilled ST services.      Pain Pain Assessment Pain Scale: Faces Pain Score: 0-No pain  Therapy/Group: Individual Therapy  Hanna Aultman  [facility] 06/29/2019, 2:20 PM

## 2019-06-29 NOTE — Progress Notes (Signed)
Occupational Therapy Session Note  Patient Details  Name: Luke Rivera MRN: [id number] Date of Birth: 04/18/1941  Today's Date: 06/29/2019 OT Individual Time: 1105-1200 OT Individual Time Calculation (min): 55 min    Short Term Goals: Week 3:  OT Short Term Goal 1 (Week 3): Continue working on establshed min assist level goals.  Skilled Therapeutic Interventions/Progress Updates:    Therapist educated and provided shoe buttons for his shoes to start the session.   He was able to unfasten and fasten the left shoe but needed assist with the right.  He was then taken down to the therapy gym where he worked on Clinical research associate.  He was able to complete three 2 min intervals with use of the ergonometer on level 1 using the RUE.  He was able to maintain grip without use of ace bandage as well.  Min assist for RUE facilitation to avoid internal rotation of the humerus when peddling forward.  Progressed to functional reaching for small foam pieces with min assist as well, educating both pt and spouse on this for completion in the room.  Pt needed min facilitation to avoid shoulder internal rotation when reaching to bedside table to pick up pieces.  He was successful on picking up 80% of the pieces with min facilitation at the shoulder and placing them in a cup.  Finished session with return to the room and pt left sitting up in the wheelchair for eating lunch.  Call button and phone in reach with alarm belt in place.    Therapy Documentation Precautions:  Precautions Precautions: Fall Precaution Comments: expressive difficulty Restrictions Weight Bearing Restrictions: No  Pain: Pain Assessment Pain Scale: Faces Pain Score: 0-No pain ADL: See Care Tool Section for some details of ADL  Therapy/Group: Individual Therapy  Addisyn Leclaire OTR/L 06/29/2019, 12:46 PM

## 2019-06-29 NOTE — Progress Notes (Signed)
Pt appeared to be agitated when entering the room. When asked, pt stated nothing was wrong, denied being in pain. When administering the night medications the pt refused the mouth wash and the topical cream. Pt had no complaints other than want to be left alone so he could rest. This nurse will continue to monitor the pt.

## 2019-06-29 NOTE — Progress Notes (Signed)
[place] PHYSICAL MEDICINE & REHABILITATION PROGRESS NOTE   Subjective/Complaints:  No issues overnnite   ROS: Inaccurate with yes no  Objective:   No results found. Recent Labs    06/28/19 0709  WBC 5.3  HGB 12.6*  HCT 37.7*  PLT 270   Recent Labs    06/26/19 1105 06/28/19 0709  NA 137 142  K 3.6 3.3*  CL 105 108  CO2 20* 24  GLUCOSE 117* 124*  BUN 21 14  CREATININE 1.36* 0.92  CALCIUM 9.0 8.6*    Intake/Output Summary (Last 24 hours) at 06/29/2019 0842 Last data filed at 06/29/2019 0700 Gross per 24 hour  Intake 440 ml  Output 725 ml  Net -285 ml     Physical Exam: Vital Signs Blood pressure (!) 131/57, pulse (!) 54, temperature 97.9 F (36.6 C), temperature source Oral, resp. rate 12, height 6' (1.829 m), weight 108.8 kg, SpO2 96 %.  Constitutional: No distress . Vital signs reviewed. HENT: Normocephalic.  Atraumatic. Eyes: EOMI.  No discharge. Cardiovascular: No JVD. Respiratory: Normal effort. GI: Non-distended. Musc: No edema or tenderness in extremities. Neurological: Alert Expressive aphasia, able to communicate at the phrase level but still has inaccuracies with yes no Dysarthria Motor: RUE 2+/5 proximal distal, unchanged RLE 3+/5 proximal to distal, unchanged Skin: Warm and dry.  Intact. Psychiatric: Unable to assess due to cognition  Assessment/Plan: 1. 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  Physiatrist is providing close team supervision and 24 hour management of active medical problems listed below.  Physiatrist and rehab team continue to assess barriers to discharge/monitor patient progress toward functional and medical goals  Care Tool:  Bathing    Body parts bathed by patient: Right arm, Left arm, Chest, Abdomen, Front perineal area, Buttocks, Right upper leg, Left lower leg, Right lower leg, Left upper leg, Face    Body parts bathed by helper: Left lower leg, Left arm Body parts n/a: Right arm, Left arm, Chest, Abdomen, Right upper leg, Left upper leg, Right lower leg, Left lower leg(Pt did not attempt this session.)   Bathing assist Assist Level: Minimal Assistance - Patient > 75%     Upper Body Dressing/Undressing Upper body dressing   What is the patient wearing?: Pull over shirt    Upper body assist Assist Level: Minimal Assistance - Patient > 75%    Lower Body Dressing/Undressing Lower body dressing      What is the patient wearing?: Incontinence brief, Pants     Lower body assist Assist for lower body dressing: Moderate Assistance - Patient 50 - 74%     Toileting Toileting    Toileting assist Assist for toileting: Moderate Assistance - Patient 50 - 74%     Transfers Chair/bed transfer  Transfers assist     Chair/bed transfer assist level: Contact Guard/Touching assist     Locomotion Ambulation   Ambulation assist   Ambulation activity did not occur: Safety/medical concerns  Assist level: Minimal Assistance - Patient > 75% Assistive device: Walker-rolling Max distance: 65 ft   Walk 10 feet activity   Assist  Walk 10 feet activity did not occur: Safety/medical concerns  Assist level: Minimal Assistance - Patient > 75% Assistive device: Walker-rolling   Walk 50 feet activity   Assist Walk 50 feet with 2 turns activity did not occur: Safety/medical concerns  Assist level: Minimal Assistance - Patient > 75% Assistive device: Walker-rolling  Walk 150 feet activity   Assist Walk 150 feet activity did not occur: Safety/medical concerns         Walk 10 feet on uneven surface  activity   Assist Walk 10 feet on uneven surfaces activity did not occur: Safety/medical concerns         Wheelchair     Assist Will patient use wheelchair at discharge?: Yes(TBD) Type of Wheelchair: Manual Wheelchair activity did not occur: Safety/medical  concerns(Per report )  Wheelchair assist level: Moderate Assistance - Patient 50 - 74% Max wheelchair distance: 57'    Wheelchair 50 feet with 2 turns activity    Assist        Assist Level: Moderate Assistance - Patient 50 - 74%   Wheelchair 150 feet activity     Assist          Medical Problem List and Plan: 1.Functional and cognitive deficitssecondary to left MCA infarct  Continue CIR plan d/c 7/24. Team conf in am  2. Antithrombotics: -DVT/anticoagulation:Pharmaceutical:Other (comment)--Eliquis -antiplatelet therapy: Plavix 3. Pain Management:tylenol prn 4. Mood:LCSW to follow for evaluation and support. -antipsychotic agents: N/A 5. Neuropsych: This patientis notcapable of making decisions on hisown behalf. 6. Skin/Wound Care:Routine pressure relief measures 7. Fluids/Electrolytes/Nutrition:  Continue to encourage, p.o. continues to be variable/poor Improving  8. CAD: On Lipitor and Plavix --no BB due to intermittent pauses.  9. T2DM with neuropathy: Monitor BS ac/hs. Was on metformin, Glipizide and Lantus at home.   -resumed metformin bid for meal coverage, increased to 850 on 7/3. AM CBG low and creat elevated will d/c am monitor needs fluids  -increase lantus to 20 on 7/2, increased to 22 on 7/11 CBG (last 3)  Recent Labs    06/28/19 1652 06/28/19 2120 06/29/19 0619  GLUCAP 88 222* 124*   Controlled 7/20 , off metformin due to AKI try to manage blood sugars with Lantus may add glipizide if needed 10.Hypoxia: Has been weaned off oxygen. Continue to monitor for recurrent fevers. 11. LUL pulmonary nodule: follow up CT in 6-12 months 12.  Right knee pain:  Improved 13.  Hypokalemia   3.3 on 7/20 increase  oral supplementation  15.  History of hypertension Vitals:   06/28/19 1954 06/29/19 0525  BP: (!) 105/51 (!) 131/57  Pulse: (!) 47 (!) 54  Resp: 12 12  Temp: 98.3 F (36.8 C) 97.9 F (36.6 C)  SpO2: 95%  96%   At this point blood pressure is in a desired range, may not be able to take enough p.o. 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

## 2019-06-29 NOTE — Progress Notes (Signed)
This nurse was rounding on the pt, pt suddenly stated " help me, I feel dizzy". Pt was able to sit up in the bed, and stated " I feel trapped in this room" " I just need to get out of this room". BP was 138/80; 18 RR, HR 73; denies chest pain/SOB, stated he did not want his breakfast right now, he just wanted to get up. This nurse educated the on taking deep breaths and laying back in the bed to help him relax.   Will continue to monitor and report to provider during morning rounds.

## 2019-06-30 ENCOUNTER — Encounter (HOSPITAL_COMMUNITY): Payer: Medicare HMO | Admitting: Occupational Therapy

## 2019-06-30 ENCOUNTER — Ambulatory Visit (HOSPITAL_COMMUNITY): Payer: Medicare HMO

## 2019-06-30 ENCOUNTER — Inpatient Hospital Stay (HOSPITAL_COMMUNITY): Payer: Medicare HMO | Admitting: Speech Pathology

## 2019-06-30 ENCOUNTER — Inpatient Hospital Stay (HOSPITAL_COMMUNITY): Payer: Medicare HMO

## 2019-06-30 LAB — GLUCOSE, CAPILLARY
Glucose-Capillary: 132 mg/dL — ABNORMAL HIGH (ref 70–99)
Glucose-Capillary: 121 mg/dL — ABNORMAL HIGH (ref 70–99)
Glucose-Capillary: 116 mg/dL — ABNORMAL HIGH (ref 70–99)
Glucose-Capillary: 133 mg/dL — ABNORMAL HIGH (ref 70–99)

## 2019-06-30 NOTE — Progress Notes (Signed)
Occupational Therapy Session Note  Patient Details  Name: Luke Rivera MRN: [id number] Date of Birth: November 01, 1941  Today's Date: 06/30/2019 OT Individual Time: 1400-1430 OT Individual Time Calculation (min): 30 min    Short Term Goals: Week 3:  OT Short Term Goal 1 (Week 3): Continue working on establshed min assist level goals.  Skilled Therapeutic Interventions/Progress Updates:    OT intervention with RUE Hooven and Gross Motor tasks-picking up small foam cubes and chest presses with unweighted ball.  Pt requires max tactile cues to maintain elbow close to trunk. Pt performed squat pivot transfer to bed with min A and repositioned in bed without assistance.  Pt remained in bed with all needs within reach and bed alarm activated.   Therapy Documentation Precautions:  Precautions Precautions: Fall Precaution Comments: expressive difficulty Restrictions Weight Bearing Restrictions: No Pain: Pt denies pain  Therapy/Group: Individual Therapy  Tavaris, Eudy 06/30/2019, 2:46 PM

## 2019-06-30 NOTE — Progress Notes (Signed)
Occupational Therapy Session Note  Patient Details  Name: Luke Rivera MRN: [id number] Date of Birth: 04-Jun-1941  Today's Date: 06/30/2019 OT Individual Time: 1005-1100 OT Individual Time Calculation (min): 55 min    Short Term Goals: Week 3:  OT Short Term Goal 1 (Week 3): Continue working on establshed min assist level goals.  Skilled Therapeutic Interventions/Progress Updates:    Pt worked on functional transfers with spouse to the simulated walk-in shower transfer using the tub bench and the RW.  His spouse was able to assist with transfer X2, which pt was able to complete with min to mod assist.  He was able to complete the transfer with use of the right AFO and shoe as well as just with gripper socks.  Finished session with return to the room and pt left in the wheelchair with call button and phone in reach.    Therapy Documentation Precautions:  Precautions Precautions: Fall Precaution Comments: expressive difficulty Restrictions Weight Bearing Restrictions: No   Pain: Pain Assessment Pain Scale: Faces Pain Score: 0-No pain ADL: See Care Tool for some details of mobility  Therapy/Group: Individual Therapy  [name] OTR/L 06/30/2019, 11:29 AM

## 2019-06-30 NOTE — Progress Notes (Signed)
[facility] PHYSICAL MEDICINE & REHABILITATION PROGRESS NOTE   Subjective/Complaints:   No issues overnite , difficulty with y/n response and naming objects  ROS: Inaccurate with yes no  Objective:   No results found. Recent Labs    06/28/19 0709  WBC 5.3  HGB 12.6*  HCT 37.7*  PLT 270   Recent Labs    06/28/19 0709  NA 142  K 3.3*  CL 108  CO2 24  GLUCOSE 124*  BUN 14  CREATININE 0.92  CALCIUM 8.6*    Intake/Output Summary (Last 24 hours) at 06/30/2019 0755 Last data filed at 06/30/2019 0736 Gross per 24 hour  Intake 222 ml  Output 400 ml  Net -178 ml     Physical Exam: Vital Signs Blood pressure (!) 121/55, pulse 61, temperature 99.1 F (37.3 C), temperature source Oral, resp. rate 18, height 6' (1.829 m), weight 105.4 kg, SpO2 97 %.  Constitutional: No distress . Vital signs reviewed. HENT: Normocephalic.  Atraumatic. Eyes: EOMI.  No discharge. Cardiovascular: No JVD. Respiratory: Normal effort. GI: Non-distended. Musc: No edema or tenderness in extremities. Neurological: Alert Expressive aphasia, able to communicate at the phrase level but still has inaccuracies with yes no Dysarthria Motor: RUE 2+/5 proximal distal, unchanged RLE 3+/5 proximal to distal, unchanged Able to name pen and light but not stethoscope  Skin: Warm and dry.  Intact. Psychiatric: Unable to assess due to cognition  Assessment/Plan: 1. 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  Physiatrist is providing close team supervision and 24 hour management of active medical problems listed below.  Physiatrist and rehab team continue to assess barriers to discharge/monitor patient progress toward functional and medical goals  Care Tool:  Bathing    Body parts bathed by patient: Right arm, Left arm, Chest, Abdomen, Front perineal area, Buttocks, Right upper leg, Left  lower leg, Right lower leg, Left upper leg, Face   Body parts bathed by helper: Left lower leg, Left arm Body parts n/a: Right arm, Left arm, Chest, Abdomen, Right upper leg, Left upper leg, Right lower leg, Left lower leg(Pt did not attempt this session.)   Bathing assist Assist Level: Minimal Assistance - Patient > 75%     Upper Body Dressing/Undressing Upper body dressing   What is the patient wearing?: Pull over shirt    Upper body assist Assist Level: Minimal Assistance - Patient > 75%    Lower Body Dressing/Undressing Lower body dressing      What is the patient wearing?: Incontinence brief, Pants     Lower body assist Assist for lower body dressing: Moderate Assistance - Patient 50 - 74%     Toileting Toileting    Toileting assist Assist for toileting: Moderate Assistance - Patient 50 - 74%     Transfers Chair/bed transfer  Transfers assist     Chair/bed transfer assist level: Contact Guard/Touching assist     Locomotion Ambulation   Ambulation assist   Ambulation activity did not occur: Safety/medical concerns  Assist level: Minimal Assistance - Patient > 75% Assistive device: Walker-rolling Max distance: 65 ft   Walk 10 feet activity   Assist  Walk 10 feet activity did not occur: Safety/medical concerns  Assist level: Minimal Assistance - Patient > 75% Assistive device: Walker-rolling   Walk 50 feet activity   Assist Walk 50 feet with 2 turns activity did not occur: Safety/medical concerns  Assist level: Minimal Assistance -  Patient > 75% Assistive device: Walker-rolling    Walk 150 feet activity   Assist Walk 150 feet activity did not occur: Safety/medical concerns         Walk 10 feet on uneven surface  activity   Assist Walk 10 feet on uneven surfaces activity did not occur: Safety/medical concerns         Wheelchair     Assist Will patient use wheelchair at discharge?: Yes(TBD) Type of Wheelchair:  Manual Wheelchair activity did not occur: Safety/medical concerns(Per report )  Wheelchair assist level: Moderate Assistance - Patient 50 - 74% Max wheelchair distance: 49'    Wheelchair 50 feet with 2 turns activity    Assist        Assist Level: Moderate Assistance - Patient 50 - 74%   Wheelchair 150 feet activity     Assist          Medical Problem List and Plan: 1.Functional and cognitive deficitssecondary to left MCA infarct  Continue CIR plan d/c 7/24. Team conference today please see physician documentation under team conference tab, met with team face-to-face to discuss problems,progress, and goals. Formulized individual treatment plan based on medical history, underlying problem and comorbidities. 2. Antithrombotics: -DVT/anticoagulation:Pharmaceutical:Other (comment)--Eliquis -antiplatelet therapy: Plavix 3. Pain Management:tylenol prn 4. Mood:LCSW to follow for evaluation and support. -antipsychotic agents: N/A 5. Neuropsych: This patientis notcapable of making decisions on hisown behalf. 6. Skin/Wound Care:Routine pressure relief measures 7. Fluids/Electrolytes/Nutrition:  Continue to encourage, p.o. continues to be variable/poor Improving  8. CAD: On Lipitor and Plavix --no BB due to intermittent pauses.  9. T2DM with neuropathy: Monitor BS ac/hs. Was on metformin, Glipizide and Lantus at home.   -resumed metformin bid for meal coverage, increased to 850 on 7/3. AM CBG low and creat elevated will d/c am monitor needs fluids  -increase lantus to 20 on 7/2, increased to 22 on 7/11 CBG (last 3)  Recent Labs    06/29/19 1723 06/29/19 2107 06/30/19 0632  GLUCAP 126* 202* 132*   Controlled 7/22 , off metformin due to AKI try to manage blood sugars with Lantus may add glipizide if needed 10.Hypoxia: Has been weaned off oxygen. Continue to monitor for recurrent fevers. 11. LUL pulmonary nodule: follow up CT in 6-12  months 12.  Right knee pain:  Improved 13.  Hypokalemia   3.3 on 7/20 increase  oral supplementation  15.  History of hypertension Vitals:   06/29/19 2107 06/30/19 0529  BP: 125/61 (!) 121/55  Pulse: 64 61  Resp: 18 18  Temp: 98.2 F (36.8 C) 99.1 F (37.3 C)  SpO2: 96% 97%   At this point blood pressure is in a desired range, may not be able to take enough p.o. 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

## 2019-06-30 NOTE — Progress Notes (Signed)
Speech Language Pathology Daily Session Note  Patient Details  Name: Luke Rivera MRN: [id number] Date of Birth: 16-Mar-1941  Today's Date: 06/30/2019 SLP Individual Time: 1130-1200 SLP Individual Time Calculation (min): 30 min  Short Term Goals: Week 3: SLP Short Term Goal 1 (Week 3): Pt will consume current diet with supervision A verbal cues for swallow strategies (lingual sweeps to clear oral residue.) SLP Short Term Goal 2 (Week 3): Pt will express wants/needs in response to yes/no questions and at phrase level with min A verbal cues for syntactic organization and word finding. SLP Short Term Goal 3 (Week 3): Pt will comprehend complex aduitory information with min A verbal cues.  Skilled Therapeutic Interventions:  Skilled treatment session focused on communication goals. SLP facilitated session by providing 2 random words for sentence creation. Pt able to create syntactially appropriate sentence in 11 out of 12 opportunities with Mod I use for word finding strategies. Pt left upright in wheelchair, lap belt alarm on and all needs within reach. Continue per current plan of care.      Pain Pain Assessment Pain Scale: Faces Pain Score: 0-No pain  Therapy/Group: Individual Therapy  Corabelle Spackman 06/30/2019, 12:44 PM

## 2019-06-30 NOTE — Progress Notes (Signed)
Physical Therapy Session Note  Patient Details  Name: Luke Rivera MRN: [id number] Date of Birth: [birth year]  Today's Date: 06/30/2019 PT Individual Time: 0845-1000 PT Individual Time Calculation (min): 75 min   Short Term Goals: Week 3:  PT Short Term Goal 1 (Week 3): STG=LTG  Skilled Therapeutic Interventions/Progress Updates:    Pt supine in bed upon PT arrival, agreeable to therapy tx and denies pain. Pt supine while therapist donned socks and R AFO total assist for time management. Pt transferred to sitting with supervision and cues for techniques. Pt performed sit<>stand with RW and supervision, therapist assisted to pull pants over hips min assist. Pt ambulated x 5 ft to w/c with CGA. Pt transported to the gym and pt's wife present for continued education. Therapist and pt's wife discussed home entrance, pt needs to perform one step up and then has 30 inch wide area to turn into the house, measured walker to ensure that it would fit and discussed techniques. Pt feels confident with step management as he performed yesterday with assist from wife. Pt transported to ortho gym, wife reports she measured the car and it is 24 inches. Pt performed car transfer this session with min assist and RW, assist from wife with appropriate cues and techniques. Pt transported to rehab apartment. Pt ambulated 2 x 15 ft within the rehab apartment from w/c<>recliner with RW and assist from his wife, w/c cushion added to recliner for increased seat<>floor height as pt and wife report that his recliner at home is taller. In the gym pt worked on ambulating while weaving through cones x 1 trial with assist from therapist and x 1 trial with assist from wife working on turns, RW and min assist. Therapist provided HEP handout this session including 2 x 10 each: LAQ, hip abduction with theraband, mini squats with RW, supine straight leg raises, and supine heel slides. Reviewed each exercises with the patient and his wife. Pt  transported back to room at end of session and left in care of wife with needs in reach.   Therapy Documentation Precautions:  Precautions Precautions: Fall Precaution Comments: expressive difficulty Restrictions Weight Bearing Restrictions: No    Therapy/Group: Individual Therapy  Netta Corrigan, PT, DPT 06/30/2019, 7:37 AM

## 2019-07-01 ENCOUNTER — Inpatient Hospital Stay (HOSPITAL_COMMUNITY): Payer: Medicare HMO | Admitting: Occupational Therapy

## 2019-07-01 ENCOUNTER — Inpatient Hospital Stay (HOSPITAL_COMMUNITY): Payer: Medicare HMO | Admitting: Physical Therapy

## 2019-07-01 ENCOUNTER — Inpatient Hospital Stay (HOSPITAL_COMMUNITY): Payer: Medicare HMO | Admitting: Speech Pathology

## 2019-07-01 LAB — BASIC METABOLIC PANEL
Anion gap: 8 (ref 5–15)
BUN: 9 mg/dL (ref 8–23)
CO2: 25 mmol/L (ref 22–32)
Calcium: 8.7 mg/dL — ABNORMAL LOW (ref 8.9–10.3)
Chloride: 107 mmol/L (ref 98–111)
Creatinine, Ser: 0.93 mg/dL (ref 0.61–1.24)
GFR calc Af Amer: >60 mL/min (ref >60)
GFR calc non Af Amer: >60 mL/min (ref >60)
Glucose, Bld: 129 mg/dL — ABNORMAL HIGH (ref 70–99)
Potassium: 4.3 mmol/L (ref 3.5–5.1)
Sodium: 140 mmol/L (ref 135–145)

## 2019-07-01 LAB — GLUCOSE, CAPILLARY
Glucose-Capillary: 119 mg/dL — ABNORMAL HIGH (ref 70–99)
Glucose-Capillary: 185 mg/dL — ABNORMAL HIGH (ref 70–99)
Glucose-Capillary: 150 mg/dL — ABNORMAL HIGH (ref 70–99)
Glucose-Capillary: 219 mg/dL — ABNORMAL HIGH (ref 70–99)

## 2019-07-01 NOTE — Progress Notes (Signed)
Occupational Therapy Session Note  Patient Details  Name: Luke Rivera MRN: [id number] Date of Birth: [birth year]  Today's Date: 07/01/2019 OT Individual Time: 7616-0737 OT Individual Time Calculation (min): 75 min    Short Term Goals: Week 4:     Skilled Therapeutic Interventions/Progress Updates:    Pt worked on shower and dressing during session.  Min assist for ambulation into the bathroom to the shower seat with use of the RW for support.  Once on the seat he was able to remove all clothing except for TEDs with min assist.  Total assist to remove TEDs.  He was able to complete bathing with min assist overall.  He was able to use a regular washcloth in the right hand with min assist to wash the right shoulder instead of using the wash mitt.  Still feel the wash mitt is appropriate for more independent and consistent use during bathing with the RUE.  He transferred to the [facility] in the bathroom for dressing.  Min assist for donning pullover shirt with mod assist to donn brief and pants.  He transferred to the wheelchair stand pivot for grooming tasks at the sink with modified independence before therapist assisted with donning TEDs.  He was able to donn the left shoe with supervision and fasten shoe button but needed mod assist for the right shoe and AFO.  Finished session with pt in the wheelchair working on his breakfast.  Call button and phone in reach with safety alarm belt in place.    Therapy Documentation Precautions:  Precautions Precautions: Fall, Other (comment) Precaution Comments: impaired expressive communication Restrictions Weight Bearing Restrictions: No  Pain: Pain Assessment Pain Scale: 0-10 Pain Score: 0-No pain Pain Intervention(s): Other (Comment)(therapy to tolerance) ADL: See Care Tool Section for some details of ADLs  Therapy/Group: Individual Therapy  Luke Rivera OTR/L  07/01/2019, 12:21 PM

## 2019-07-01 NOTE — Discharge Summary (Addendum)
Physician Discharge Summary  Patient ID: Luke Rivera MRN: [id number] DOB/AGE: 1941-03-05 78 y.o.  Admit date: 06/07/2019 Discharge date: 07/02/2019  Discharge Diagnoses:   Left MCA infarct Principal Problem:   CVA (cerebral vascular accident) [facility]) Active Problems:   Acute blood loss anemia   Hypokalemia   Diabetic peripheral neuropathy ([place])   Right knee pain   Diabetes mellitus type 2 in obese ([place])   Dysphagia, post-stroke   Discharged Condition: stable   Significant Diagnostic Studies: Dg Knee 1-2 Views Right  Result Date: 06/10/2019 CLINICAL DATA:  Right knee pain. EXAM: RIGHT KNEE - 1-2 VIEW COMPARISON:  None. FINDINGS: No evidence of fracture, dislocation, or joint effusion. No evidence of arthropathy. Minimal patellar spurring is noted. Soft tissues are unremarkable. IMPRESSION: No significant abnormality seen in the right knee. Electronically Signed   By: Marijo Conception M.D.   On: 06/10/2019 17:30    Labs:  Basic Metabolic Panel: Recent Labs  Lab 06/25/19 0552 06/25/19 1529 06/26/19 1105 06/28/19 0709 07/01/19 0555  NA 138 137 137 142 140  K 2.6* 3.1* 3.6 3.3* 4.3  CL 103 102 105 108 107  CO2 24 23 20* 24 25  GLUCOSE 100* 172* 117* 124* 129*  BUN 22 24* 21 14 9   CREATININE 1.50* 2.06* 1.36* 0.92 0.93  CALCIUM 8.9 9.0 9.0 8.6* 8.7*    CBC: CBC Latest Ref Rng & Units 06/28/2019 06/21/2019 06/14/2019  WBC 4.0 - 10.5 K/uL 5.3 7.9 6.8  Hemoglobin 13.0 - 17.0 g/dL 12.6(L) 13.5 13.0  Hematocrit 39.0 - 52.0 % 37.7(L) 40.9 38.4(L)  Platelets 150 - 400 K/uL 270 423(H) 420(H)    CBG: No results for input(s): GLUCAP in the last 168 hours.  Brief HPI:   Luke Rivera is a 78 year old RH male with history of CAD/CAF, DVT, TIA, T2DM with peripheral neuropathy, multiple back surgeries with limited mobility who was admitted on 05/31/19 with garbled speech and right-sided weakness.  CT head negative for bleed and CTA head and neck was negative for LVO but showed subacute  left MCA infarct, 65 to 70% left ICA and 50% proximal right ICA stenosis and incidental LUL nodule.  T.  MRI brain revealed acute left MCA infarct involving posterior frontal lobe and chronic left frontal infarct.  TCD was negative for PFO.  Telemetry revealed frequent PVCs with pauses therefore beta-blocker was discontinued.  Wife reported history of arrhythmias and Dr. Aris Lot nausea recommended increasing home dose Eliquis to 5 mg twice daily with close monitoring for signs of bleeding and follow-up with primary cardiologist after discharge as patient.   Patient with resultant right hemiparesis with dysphasia and aphasia.  CIR recommended due to functional decline   Hospital Course: Hussain Maimone was admitted to rehab 06/07/2019 for inpatient therapies to consist of PT, ST and OT at least three hours five days a week. Past admission physiatrist, therapy team and rehab RN have worked together to provide customized collaborative inpatient rehab.  Serial CBC shows H&H and platelets to be stable on Eliquis and Plavix.  No signs of bleeding noted.  Patient's diet was downgraded to dysphagia 1 due to difficulty with chewing and propulsion.  His intake has been extremely poor and wife has been providing food from home to help improve his appetite.  His diabetes has been monitored with ac/hs CBG checks and Lantus was decreased to prevent hypoglycemic episode.  He did have transient worsening of renal status therefore metformin was discontinued.  Currently blood sugars are well  controlled on Lantus alone and he is to follow-up with primary MD for further adjustment of oral hypoglycemic regimen.  Serial check of lytes revealed recurrent hypokalemia.  This was supplemented aggressively with resolution.  He is to continue on 20 mEq of K-Dur daily at discharge with repeat BMET next Wednesday.  He has had weakness and difficulty with weightbearing right knee.  X-rays of the knee revealed minimal patellar spurring and no  significant abnormality.  Voltaren gel has been used to bilateral knees to help with pain management.  He is continent of bowel and bladder.    Blood pressures were monitored on bid basis and have been controlled without use of meds.  Rehab team has been providing ego support and his mood has improved with wife providing additional support during his stay. He has had improvement in RUE and RLE strength with improvement in activity tolerance and verbal output.He currently requires CGA to min assist and will continue to receive follow up [place] [place] [place] [facility] [place] [facility] after discharge.    Rehab course: During patient's stay in rehab weekly team conferences were held to monitor patient's progress, set goals and discuss barriers to discharge. At admission, patient required max assist with basic self-care task and total assist with mobility.  He exhibited primarily nonfluent expressive aphasia with mild receptive component.  He was able to repeat simple words and sentences with 80% accuracy.  Dysphagia was primarily oral in nature with intermittent spillage and pocketing on the right. He  has had improvement in activity tolerance, balance, postural control as well as ability to compensate for deficits.   He has had improvement in functional use RUE  and RLE as well as improvement in awareness. He is able to complete ADL tasks with min assist. He requires CGA to min assist for transfer. He is able to ambulate 81' with CGA to min assist.  He is able to verbally communicate wants and needs. He continues to exhibit expressive > receptive communication. He is tolerating dysphagia 2 diet without s/s of aspiration.  Hands-on family education was completed with wife regarding all aspects of safety, care and mobility. She has been educated on all current meds changes, appropriate diet and importance of CBG checks g after discharge   Disposition: Home  Diet: Heart healthy/Carb Modified.  Special  Instructions: 1. Monitor BS ac/hs and record. 2.  LUL nodule will need follow-up studies in 6 to 12 months.   3. HHRN to draw BMET on 07/29 with results to Dr. Karle Starch.   Discharge Instructions    Ambulatory referral to Physical Medicine Rehab   Complete by: As directed    1-2 weeks transitional care appt     Allergies as of 07/02/2019   No Known Allergies     Medication List    STOP taking these medications   feeding supplement (ENSURE ENLIVE) Liqd   insulin aspart 100 UNIT/ML injection Commonly known as: novoLOG     TAKE these medications   acetaminophen 325 MG tablet Commonly known as: TYLENOL Take 1-2 tablets (325-650 mg total) by mouth every 4 (four) hours as needed for mild pain. What changed:   how much to take  reasons to take this   apixaban 5 MG Tabs tablet Commonly known as: ELIQUIS Take 1 tablet (5 mg total) by mouth 2 (two) times daily. Notes to patient: NOW TAKING WHOLE PILL.    atorvastatin 80 MG tablet Commonly known as: LIPITOR Take 40 mg by mouth at  bedtime.   clopidogrel 75 MG tablet Commonly known as: PLAVIX Take 1 tablet (75 mg total) by mouth daily.   diclofenac sodium 1 % Gel Commonly known as: VOLTAREN Apply 2 g topically 4 (four) times daily. Notes to patient: OR CAN USE BLUE EMU   insulin glargine 100 UNIT/ML injection Commonly known as: LANTUS Inject 0.22 mLs (22 Units total) into the skin at bedtime. What changed: how much to take   loperamide 2 MG tablet Commonly known as: Imodium A-D Take 1 tablet (2 mg total) by mouth 4 (four) times daily as needed for diarrhea or loose stools.   multivitamin with minerals Tabs tablet Take 1 tablet by mouth daily.   nitroGLYCERIN 0.6 MG SL tablet Commonly known as: NITROSTAT Place 0.6 mg under the tongue as needed for chest pain.   pantoprazole 40 MG tablet Commonly known as: PROTONIX Take 40 mg by mouth daily.   potassium chloride SA 20 MEQ tablet Commonly known as: K-DUR Take 1  tablet (20 mEq total) by mouth daily.      Follow-up Information    Kristopher Glee., MD Follow up.   Specialty: Internal Medicine Contact information: [address] [place] [address] [phone]        Dudley Major, MD. Call.   Specialty: Internal Medicine Why: for follow up appointment of A fib/medication changes  Contact information: [place] [address] [phone]        Charlett Blake, MD Follow up.   Specialty: Physical Medicine and Rehabilitation Why: Office will call you with follow up appointment Contact information: [place] [address] [phone]        [facility]. Call.   Specialty: Neurology Why: for follow up appointment Contact information: [address] [place]       Parks Neptune, MD Follow up.   Specialty: Family Medicine Why: They will call you next week to follow up with you. Contact information: [place] [address] [phone]           Signed: Bary Leriche 07/09/2019, 4:45 PM

## 2019-07-01 NOTE — Progress Notes (Signed)
Speech Language Pathology Discharge Summary  Patient Details  Name: Luke Rivera MRN: [id number] Date of Birth: 1941/03/16  Today's Date: 07/01/2019 SLP Individual Time: 10-1354 SLP Individual Time Calculation (min): 10 min   Skilled Therapeutic Interventions:  Skilled treatment session focused on communication goals. SLP recieved pt sleeping in wheelchair. Although pt was able to arouse to his name, pt's speech was more dysfluent d/t inability to sustain arousal. Pt able to answer yes/no questions about fatigue and lunch but despite maximal effort, pt unable to keep eyes open for structured ST tasks. Pt left asleep in wheelchair with all needs within reach.   As a result of pt fatigue, he missed 20 minutes of skilled ST services.    Patient has met 3 of 3 long term goals.  Patient to discharge at overall   level.    Clinical Impression/Discharge Summary:   Pt has made good progress over course of skilled ST sessions and as a result, he is better able to verbally communicate wants and needs. Pt continues to exhibit expressive > receptive deficits and would benefit from follow up ST services to target increased verbal communication. All education has been completed.   Care Partner:  Caregiver Able to Provide Assistance: Yes  Type of Caregiver Assistance: Physical;Cognitive  Recommendation:  Home Health SLP;24 hour supervision/assistance  Rationale for SLP Follow Up: Maximize functional communication;Maximize swallowing safety;Reduce caregiver burden   Equipment:   N/A  Reasons for discharge: Treatment goals met   Patient/Family Agrees with Progress Made and Goals Achieved: Yes    Johntay Doolen 07/01/2019, 3:09 PM

## 2019-07-01 NOTE — Progress Notes (Signed)
[place] PHYSICAL MEDICINE & REHABILITATION PROGRESS NOTE   Subjective/Complaints:  No issues overnight but patient was agitated earlier this morning when he did not get into wheelchair at 5:30 AM Spoke to wife about follow-up instructions such as seeing his primary care physician Dr. Karle Starch rather than going to the [place] in [place]  ROS: Inaccurate with yes no  Objective:   No results found. No results for input(s): WBC, HGB, HCT, PLT in the last 72 hours. Recent Labs    07/01/19 0555  NA 140  K 4.3  CL 107  CO2 25  GLUCOSE 129*  BUN 9  CREATININE 0.93  CALCIUM 8.7*    Intake/Output Summary (Last 24 hours) at 07/01/2019 5176 Last data filed at 07/01/2019 0532 Gross per 24 hour  Intake -  Output 750 ml  Net -750 ml     Physical Exam: Vital Signs Blood pressure (!) 146/72, pulse 69, temperature 98 F (36.7 C), resp. rate (!) 21, height 6' (1.829 m), weight 107.4 kg, SpO2 96 %.  Constitutional: No distress . Vital signs reviewed. HENT: Normocephalic.  Atraumatic. Eyes: EOMI.  No discharge. Cardiovascular: No JVD. Respiratory: Normal effort. GI: Non-distended. Musc: No edema or tenderness in extremities. Neurological: Alert Expressive aphasia, able to communicate at the phrase level but still has inaccuracies with yes no Dysarthria Motor: RUE 2+/5 proximal distal, unchanged RLE 3+/5 proximal to distal, unchanged Able to name pen and light but not stethoscope  Skin: Warm and dry.  Intact. Psychiatric: Unable to assess due to cognition  Assessment/Plan: 1. 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  Physiatrist is providing close team supervision and 24 hour management of active medical problems listed below.  Physiatrist and rehab team continue to assess barriers to discharge/monitor patient progress toward functional and medical goals  Care  Tool:  Bathing    Body parts bathed by patient: Right arm, Left arm, Chest, Abdomen, Front perineal area, Buttocks, Right upper leg, Left lower leg, Right lower leg, Left upper leg, Face   Body parts bathed by helper: Left lower leg, Left arm Body parts n/a: Right arm, Left arm, Chest, Abdomen, Right upper leg, Left upper leg, Right lower leg, Left lower leg(Pt did not attempt this session.)   Bathing assist Assist Level: Minimal Assistance - Patient > 75%     Upper Body Dressing/Undressing Upper body dressing   What is the patient wearing?: Pull over shirt    Upper body assist Assist Level: Minimal Assistance - Patient > 75%    Lower Body Dressing/Undressing Lower body dressing      What is the patient wearing?: Incontinence brief, Pants     Lower body assist Assist for lower body dressing: Moderate Assistance - Patient 50 - 74%     Toileting Toileting    Toileting assist Assist for toileting: Moderate Assistance - Patient 50 - 74%     Transfers Chair/bed transfer  Transfers assist     Chair/bed transfer assist level: Contact Guard/Touching assist     Locomotion Ambulation   Ambulation assist   Ambulation activity did not occur: Safety/medical concerns  Assist level: Contact Guard/Touching assist Assistive device: Walker-rolling Max distance: 20 ft   Walk 10 feet activity   Assist  Walk 10 feet activity did not occur: Safety/medical concerns  Assist level: Contact Guard/Touching assist Assistive device: Walker-rolling   Walk 50 feet activity   Assist Walk 50 feet with  2 turns activity did not occur: Safety/medical concerns  Assist level: Minimal Assistance - Patient > 75% Assistive device: Walker-rolling    Walk 150 feet activity   Assist Walk 150 feet activity did not occur: Safety/medical concerns         Walk 10 feet on uneven surface  activity   Assist Walk 10 feet on uneven surfaces activity did not occur: Safety/medical  concerns         Wheelchair     Assist Will patient use wheelchair at discharge?: Yes(TBD) Type of Wheelchair: Manual Wheelchair activity did not occur: Safety/medical concerns(Per report )  Wheelchair assist level: Moderate Assistance - Patient 50 - 74% Max wheelchair distance: 73'    Wheelchair 50 feet with 2 turns activity    Assist        Assist Level: Moderate Assistance - Patient 50 - 74%   Wheelchair 150 feet activity     Assist          Medical Problem List and Plan: 1.Functional and cognitive deficitssecondary to left MCA infarct  Continue CIR plan d/c 7/24. 2. Antithrombotics: -DVT/anticoagulation:Pharmaceutical:Other (comment)--Eliquis -antiplatelet therapy: Plavix 3. Pain Management:tylenol prn 4. Mood:LCSW to follow for evaluation and support. -antipsychotic agents: N/A 5. Neuropsych: This patientis notcapable of making decisions on hisown behalf. 6. Skin/Wound Care:Routine pressure relief measures 7. Fluids/Electrolytes/Nutrition:  Continue to encourage, p.o. continues to be variable/poor Improving  8. CAD: On Lipitor and Plavix --no BB due to intermittent pauses.  9. T2DM with neuropathy: Monitor BS ac/hs. Was on metformin, Glipizide and Lantus at home.   -resumed metformin bid for meal coverage, increased to 850 on 7/3. AM CBG low and creat elevated will d/c am monitor needs fluids  -increase lantus to 20 on 7/2, increased to 22 on 7/11 CBG (last 3)  Recent Labs    06/30/19 1659 06/30/19 2122 07/01/19 0621  GLUCAP 116* 133* 119*   Controlled 7/22 , off metformin due to AKI try to manage blood sugars with Lantus may add glipizide if needed will f/u with PCP as OP 10.Hypoxia: Has been weaned off oxygen. Continue to monitor for recurrent fevers. 11. LUL pulmonary nodule: follow up CT in 6-12 months 12.  Right knee pain:  Improved 13.  Hypokalemia   4.3 on 7/24 will decrease  oral supplementation   15.  History of hypertension Vitals:   07/01/19 0530 07/01/19 0556  BP: 125/75 (!) 146/72  Pulse: 64 69  Resp: 18 (!) 21  Temp: 98 F (36.7 C) 98 F (36.7 C)  SpO2: 98% 96%   At this point blood pressure is in a desired range, may not be able to take enough p.o. fluids to support Cozaar 16.  Post stroke dysphagia  On D2 thins, advance diet as tolerated   LOS: 24 days A FACE TO [place] E Kirsteins 07/01/2019, 8:22 AM

## 2019-07-01 NOTE — Plan of Care (Signed)
  Problem: Consults Goal: RH STROKE PATIENT EDUCATION Description: See Patient Education module for education specifics  Outcome: Progressing   Problem: RH BOWEL ELIMINATION Goal: RH STG MANAGE BOWEL WITH ASSISTANCE Description: STG Manage Bowel with mod Assistance. Outcome: Progressing Goal: RH STG MANAGE BOWEL W/MEDICATION W/ASSISTANCE Description: STG Manage Bowel with Medication with mod Assistance. Outcome: Progressing   Problem: RH BLADDER ELIMINATION Goal: RH STG MANAGE BLADDER WITH ASSISTANCE Description: STG Manage Bladder With min Assistance Outcome: Progressing   Problem: RH SKIN INTEGRITY Goal: RH STG MAINTAIN SKIN INTEGRITY WITH ASSISTANCE Description: STG Maintain Skin Integrity With min Assistance. Outcome: Progressing   Problem: RH SAFETY Goal: RH STG ADHERE TO SAFETY PRECAUTIONS W/ASSISTANCE/DEVICE Description: STG Adhere to Safety Precautions With min cues/reminders Assistance/Device. Outcome: Progressing   Problem: RH COGNITION-NURSING Goal: RH STG ANTICIPATES NEEDS/CALLS FOR ASSIST W/ASSIST/CUES Description: STG Anticipates Needs/Calls for Assist With  min cues/reminders Assistance/Cues. Outcome: Progressing   Problem: RH KNOWLEDGE DEFICIT Goal: RH STG INCREASE KNOWLEDGE OF DIABETES Description: Pt will be able to manage DM with medications, and diet restrictions using handouts and notebooks with min assist by wife Outcome: Progressing Goal: RH STG INCREASE KNOWLEDGE OF HYPERTENSION Description: Pt will be able to manage HTN with medications, and diet restrictions using handouts and notebooks with min assist by wife Outcome: Progressing Goal: RH STG INCREASE KNOWLEDGE OF DYSPHAGIA/FLUID INTAKE Description: Pt will be able to manage dysphagia and diet restrictions using handouts and notebooks with min assist by wife Outcome: Progressing Goal: RH STG INCREASE KNOWLEGDE OF HYPERLIPIDEMIA Description: Pt will be able to manage HLD with medications, and diet  restrictions using handouts and notebooks with min assist by wife Outcome: Progressing Goal: RH STG INCREASE KNOWLEDGE OF STROKE PROPHYLAXIS Description: Pt will be able to manage secondary stroke prevention with medications, and diet restrictions using handouts and notebooks with min assist by wife Outcome: Progressing

## 2019-07-01 NOTE — Progress Notes (Signed)
07/01/2019 0550: Charge nurse is sitting at charge desk when yelling and obscenities are heard from the high end of 4W. Charge goes to see where the yelling is coming from and discovers that the pt in room 4W13 is yelling obscenities at the NT. Pt says that he is suffocating. Charge tries to intercept with therapeutic communication as pt is visibly upset and irritable. This does not help and pt continues to yell obscenities at the NT and charge nurse. Charge instructs NT to take pt O2 reading. O2 sat is 98%. Yelling has ceased from the pt's room for the moment. Pt is asymptomatic with call bell at side. RN and NT will continue to monitor.

## 2019-07-01 NOTE — Progress Notes (Signed)
Occupational Therapy Discharge Summary  Patient Details  Name: Luke Rivera MRN: [id number] Date of Birth: [birth year]  Today's Date: 07/01/2019 OT Individual Time: 1415-1501 OT Individual Time Calculation (min): 46 min   Session Note:  Pt was transported down to the OT gym with his spouse present as well.  He was educated on FM coordination and gross motor coordination exercises for home and handouts were provided.  He was able to use a dowel rod to complete 2 sets of approximately 10 reps for bilateral shoulder flexion.  One was completed in sitting with pt demonstrating moderate compensatory strategies in the RUE with shoulder hike.  Educated pt and spouse on grading of activity by performing exercise in supine to help eliminate gravity.  He was able to transition to supine with supervision and then complete 1 set of 10 reps with supervision.  He transitioned back to sitting where he worked on Print production planner.  He completed 42 blocks with the LUE and 6 blocks with the right.  Finished session with return to the room via wheelchair and pt left sitting up with spouse present and call button and phone in reach.  Safety belt in place as well.    Patient has met 13 of 13 long term goals due to improved activity tolerance, improved balance, postural control, ability to compensate for deficits, functional use of  RIGHT upper and RIGHT lower extremity, improved attention, improved awareness and improved coordination.  Patient to discharge at [facility] Assist level.  Patient's care partner is independent to provide the necessary physical and cognitive assistance at discharge.    Reasons goals not met: NA  Recommendation:  Patient will benefit from ongoing skilled OT services in home health setting to continue to advance functional skills in the area of BADL, iADL and Reduce care partner burden.  Pt still demonstrates decreased dynamic standing balance as well as decreased RUE functional use and  coordination.  Expressive deficits are also still present as well as mild impairments in awareness and safety.  Recommend continued HHOT to further progress pt to supervision/modified independent level and increased RUE functional use to a non-dominant level for greater independence and preparation for outpatient services.    Equipment: tub bench and 3:1  Reasons for discharge: treatment goals met and discharge from hospital  Patient/family agrees with progress made and goals achieved: Yes  OT Discharge Precautions/Restrictions  Precautions Precautions: Fall Precaution Comments: impaired expressive communication Restrictions Weight Bearing Restrictions: No  Pain Pain Assessment Pain Scale: Faces Pain Score: 0-No pain ADL ADL Eating: Modified independent Where Assessed-Eating: Wheelchair Grooming: Modified independent Where Assessed-Grooming: Wheelchair, Sitting at sink Upper Body Bathing: Supervision/safety Where Assessed-Upper Body Bathing: Shower Lower Body Bathing: Minimal assistance Where Assessed-Lower Body Bathing: Shower Upper Body Dressing: Minimal assistance Where Assessed-Upper Body Dressing: Wheelchair Lower Body Dressing: Moderate assistance Where Assessed-Lower Body Dressing: Wheelchair Toileting: Moderate assistance Where Assessed-Toileting: Glass blower/designer: Therapist, music Method: Counselling psychologist: Raised Counselling psychologist: Environmental education officer Method: Heritage manager: Radio broadcast assistant Vision Baseline Vision/History: Wears glasses Wears Glasses: At all times Patient Visual Report: Blurring of vision Vision Assessment?: Yes Eye Alignment: Within Functional Limits Ocular Range of Motion: Within Functional Limits Alignment/Gaze Preference: Within Defined Limits Tracking/Visual Pursuits: Decreased smoothness of horizontal tracking;Decreased smoothness of  vertical tracking Convergence: Within functional limits Visual Fields: No apparent deficits Perception  Perception: Within Functional Limits Praxis Praxis: Intact Cognition Overall Cognitive Status: Impaired/Different from baseline  Arousal/Alertness: Awake/alert Orientation Level: Oriented X4 Attention: Selective Focused Attention: Appears intact Sustained Attention: Appears intact Selective Attention: Appears intact Memory: Impaired Memory Impairment: Storage deficit;Decreased recall of new information Awareness: Impaired Awareness Impairment: Anticipatory impairment Problem Solving: Impaired Problem Solving Impairment: Verbal complex;Functional complex Safety/Judgment: Impaired Sensation Sensation Light Touch: Appears Intact Hot/Cold: Appears Intact Proprioception: Appears Intact(intact in BUEs) Stereognosis: Not tested Coordination Gross Motor Movements are Fluid and Coordinated: No Fine Motor Movements are Fluid and Coordinated: No Coordination and Movement Description: Pt with Brunnstrum stage V movement in the right hand and arm can complete bathing with use of the RUE as an active assist with supervision.  Min to mod assist needed to integrate into dressing tasks or washing his hair.  Pt completed box in blocks test with 42 bocks on the left and only 6 blocks on the right. Motor  Motor Motor: Hemiplegia;Abnormal tone Motor - Discharge Observations: R hemiparesis still present Mobility  Bed Mobility Supine to Sit: Supervision/Verbal cueing Sit to Supine: Supervision/Verbal cueing Transfers Sit to Stand: Contact Guard/Touching assist Stand to Sit: Contact Guard/Touching assist  Trunk/Postural Assessment  Cervical Assessment Cervical Assessment: Exceptions to WFL(cervical protraction at rest) Thoracic Assessment Thoracic Assessment: Exceptions to WFL(thoracic rounding) Lumbar Assessment Lumbar Assessment: (Posterior pelvic tilt in sitting)   Balance Balance Balance Assessed: Yes Static Sitting Balance Static Sitting - Balance Support: Feet supported Static Sitting - Level of Assistance: 7: Independent Dynamic Sitting Balance Dynamic Sitting - Level of Assistance: 5: Stand by assistance Static Standing Balance Static Standing - Balance Support: During functional activity Static Standing - Level of Assistance: 5: Stand by assistance Dynamic Standing Balance Dynamic Standing - Balance Support: During functional activity Dynamic Standing - Level of Assistance: 4: Min assist Extremity/Trunk Assessment RUE Assessment RUE Assessment: Exceptions to [facility] Passive Range of Motion (PROM) Comments: WFLS Active Range of Motion (AROM) Comments: Pt with Brunnstrum stage V movement in the right arm and shoulder.  He demonstrates synergy pattern still with shoulder flexion as well as elbow flexion but exhibits some isolated movements in the wrist and digits.  He can oppose his thumb to all digits except for the 5th at this time. RUE Body System: Neuro Brunstrum levels for arm and hand: Arm;Hand Brunstrum level for arm: Stage V Relative Independence from Synergy Brunstrum level for hand: Stage V Independence from basic synergies LUE Assessment LUE Assessment: Within Functional Limits   Miaya Lafontant  OTR/L 07/01/2019, 5:19 PM

## 2019-07-01 NOTE — Progress Notes (Signed)
RN demonstrated insulin teaching with wife and pt. Wife demonstrated properly using the demo skin pad and Lantus and insulin syringe she brought in from home.Wife also performed CBG using pts glucometer and supplies. Pt and wife feel comfortable performing these tasks at home.   Erie Noe, RN

## 2019-07-01 NOTE — Progress Notes (Signed)
Physical Therapy Discharge Summary  Patient Details  Name: Luke Rivera MRN: [id number] Date of Birth: [birth year]  Today's Date: 07/01/2019 PT Individual Time: 1121-1216 PT Individual Time Calculation (min): 55 min   Patient has met 6 of 9 long term goals due to improved activity tolerance, improved balance, improved postural control, increased strength, decreased pain, ability to compensate for deficits, functional use of  right upper extremity and right lower extremity, improved attention, improved awareness and improved coordination.  Patient to discharge at an ambulatory level CGA/Min Assist. Patient's care partner attended family training and is independent to provide the necessary physical assistance at discharge.  Reasons goals not met: Patient continues to require intermittent assist for R LE management on/off of bed depending on fatigue level. Patient discharging at an ambulatory level and will be using w/c primarily as dependent transport for longer distance and community mobility.   Recommendation:  Patient will benefit from ongoing skilled PT services in home health setting to continue to advance safe functional mobility, address ongoing impairments in R hemibody strength, standing balance, gait training, stair navigation, activity tolerance, R LE neuro re-ed, and minimize fall risk.  Equipment: Recommend 20x18 wheelchair and w/c cushion. Pt has RW.  Reasons for discharge: treatment goals met and discharge from hospital  Patient/family agrees with progress made and goals achieved: Yes  Skilled Therapeutic Interventions/Progress Updates:  Patient received sitting in w/c and agreeable to therapy session. Sit<>stands to/from w/c using RW with close supervision for safety throughout session and cuing for R UE hand placement on orthotic. Stand pivot w/c>EOB using RW with close supervision. Sit>supine with CGA for R LE management and supine>sit with significantly increased time and close  supervision for safety - cuing for sequencing throughout to increase pt independence. Therapist donned R LE knee hyperextension brace. Ambulated 38f using RW with close supervision and 1x CGA for steadying then towards end of ambulation pt demonstrates increased fatigue and poor AD management requiring min assist for balance and AD management prior to sitting in w/c. Therapist doffed knee hyperextension brace. Transported remainder of distance in w/c to car room. Ambulatory car transfer (24in vehicle height) using RW with close supervision and CGA for steadying when standing up from lower surface of car - pt able to perform LE management in/out of car without assistance. Pt not agreeable to attempting ambulation up/down ramp despite encouragement. Pt performed L UE and L LE w/c propulsion progressed to L UE and B LE w/c propulsion ~244fwith min assist for R LE management and for propelling straight forward with pt propelling at significantly decreased speed - pt reports increased fatigue with performing w/c propulsion. Transported remainder of distance to main therapy gym. Ascended/descended 4" step using RW with pt able to recall and demonstrate proper leading LE stepping up with L LE and down with R LE all with CGA/min assist for balance. Transported back to room in w/c. Stand pivot w/c>pt's new personal w/c using RW with close supervision. Pt's personal w/c noted to have smaller width than the one he has been using but still fits and pt reports it is comfortable. Pt left sitting in w/c with needs in reach, seat belt alarm on, and Pam, PA present.  PT Discharge Precautions/Restrictions Precautions Precautions: Fall;Other (comment) Precaution Comments: impaired expressive communication Restrictions Weight Bearing Restrictions: No Pain Pain Assessment Pain Scale: 0-10 Pain Score: 0-No pain Pain Intervention(s): Other (Comment)(therapy to tolerance) Vision/Perception  Perception Perception: Within  Functional Limits Praxis Praxis: Intact  Cognition Overall Cognitive Status: Impaired/Different  from baseline Arousal/Alertness: Awake/alert Orientation Level: Oriented X4 Attention: Focused;Sustained Focused Attention: Appears intact Sustained Attention: Appears intact Safety/Judgment: Appears intact Sensation Sensation Light Touch: Impaired by gross assessment Peripheral sensation comments: history of peripheral sensation loss in feet from DM Light Touch Impaired Details: Impaired RLE;Impaired LLE Hot/Cold: Not tested Proprioception: Impaired by gross assessment Stereognosis: Not tested Coordination Gross Motor Movements are Fluid and Coordinated: No Coordination and Movement Description: due to mild R hemiplegia and impaired sensation Heel Shin Test: decreased ROM when performign with R LE due to impaired strength Motor  Motor Motor: Hemiplegia;Abnormal tone Motor - Discharge Observations: R hemiparesis  Mobility Bed Mobility Bed Mobility: Supine to Sit;Sit to Supine Supine to Sit: Supervision/Verbal cueing Sit to Supine: Supervision/Verbal cueing;Contact Guard/Touching assist(intermittent CGA for R LE management) Transfers Transfers: Sit to Stand;Stand to Sit;Stand Pivot Transfers Sit to Stand: Supervision/Verbal cueing;Set up assist Stand to Sit: Supervision/Verbal cueing;Set up assist Stand Pivot Transfers: Set up assist;Supervision/Verbal cueing Transfer (Assistive device): Rolling walker Locomotion  Gait Ambulation: Yes Gait Assistance: Contact Guard/Touching assist;Minimal Assistance - Patient > 75% Gait Distance (Feet): 87 Feet Assistive device: Rolling walker Gait Assistance Details: Tactile cues for posture;Verbal cues for gait pattern;Verbal cues for safe use of DME/AE;Verbal cues for technique Gait Gait: Yes Gait Pattern: Impaired Gait Pattern: Right genu recurvatum;Decreased stride length;Decreased step length - right;Decreased stance time - right;Trunk  flexed;Poor foot clearance - right;Narrow base of support Gait velocity: decreased Stairs / Additional Locomotion Stairs: Yes Stairs Assistance: Minimal Assistance - Patient > 75%;Contact Guard/Touching assist Stair Management Technique: With walker Number of Stairs: 1 Height of Stairs: 4 Wheelchair Mobility Wheelchair Mobility: Yes Wheelchair Assistance: Minimal assistance - Patient >75% Wheelchair Propulsion: Left upper extremity;Both lower extermities Wheelchair Parts Management: Needs assistance Distance: 27f  Trunk/Postural Assessment  Cervical Assessment Cervical Assessment: Exceptions to WFL(cervical protraction) Thoracic Assessment Thoracic Assessment: Exceptions to WFL(rounded shoulders and flexed posture) Lumbar Assessment Lumbar Assessment: Exceptions to WFL(poserior pelvic tilt in sitting) Postural Control Postural Control: Deficits on evaluation Trunk Control: now able to maintain midline orientation while standing Protective Responses: impaired Postural Limitations: decreased requiring B UE support on RW  Balance Balance Balance Assessed: Yes Static Sitting Balance Static Sitting - Balance Support: Feet supported Static Sitting - Level of Assistance: 7: Independent Dynamic Sitting Balance Dynamic Sitting - Level of Assistance: 5: Stand by assistance Static Standing Balance Static Standing - Balance Support: During functional activity Static Standing - Level of Assistance: 5: Stand by assistance Dynamic Standing Balance Dynamic Standing - Balance Support: During functional activity;Bilateral upper extremity supported Dynamic Standing - Level of Assistance: 4: Min assist Extremity Assessment      RLE Assessment RLE Assessment: Exceptions to [facility] Strength Right Hip Flexion: 3+/5 Right Knee Flexion: 2-/5 Right Knee Extension: 4-/5 Right Ankle Dorsiflexion: 2-/5 Right Ankle Plantar Flexion: 3+/5 LLE Strength Left Hip Flexion: 4/5 Left Knee Flexion:  4/5 Left Knee Extension: 4/5 Left Ankle Dorsiflexion: 4/5 Left Ankle Plantar Flexion: 4/5    Panagiota Perfetti M Brookelin Felber, PT, DPT 07/01/2019, 8:03 AM

## 2019-07-02 LAB — GLUCOSE, CAPILLARY: Glucose-Capillary: 130 mg/dL — ABNORMAL HIGH (ref 70–99)

## 2019-07-02 MED ORDER — INSULIN GLARGINE 100 UNIT/ML ~~LOC~~ SOLN: 22.0000 [IU] | Freq: Every day | SUBCUTANEOUS | 11 refills | Status: AC

## 2019-07-02 MED ORDER — POTASSIUM CHLORIDE CRYS ER 20 MEQ PO TBCR: 30.0000 meq | EXTENDED_RELEASE_TABLET | Freq: Every day | ORAL | Status: DC

## 2019-07-02 MED ORDER — POTASSIUM CHLORIDE CRYS ER 20 MEQ PO TBCR: 20.0000 meq | EXTENDED_RELEASE_TABLET | Freq: Every day | ORAL | Status: DC

## 2019-07-02 NOTE — Progress Notes (Signed)
[facility] PHYSICAL MEDICINE & REHABILITATION PROGRESS NOTE   Subjective/Complaints:  Feels ready to go home   ROS: Inaccurate with yes no  Objective:   No results found. No results for input(s): WBC, HGB, HCT, PLT in the last 72 hours. Recent Labs    07/01/19 0555  NA 140  K 4.3  CL 107  CO2 25  GLUCOSE 129*  BUN 9  CREATININE 0.93  CALCIUM 8.7*    Intake/Output Summary (Last 24 hours) at 07/02/2019 0829 Last data filed at 07/02/2019 0734 Gross per 24 hour  Intake 950 ml  Output 1590 ml  Net -640 ml     Physical Exam: Vital Signs Blood pressure (!) 121/55, pulse 78, temperature 98.2 F (36.8 C), resp. rate 18, height 6' (1.829 m), weight 107.4 kg, SpO2 100 %.  Constitutional: No distress . Vital signs reviewed. HENT: Normocephalic.  Atraumatic. Eyes: EOMI.  No discharge. Cardiovascular: No JVD. Respiratory: Normal effort. GI: Non-distended. Musc: No edema or tenderness in extremities. Neurological: Alert Expressive aphasia, able to communicate at the phrase level but still has inaccuracies with yes no Dysarthria Motor: RUE 2+/5 proximal distal, unchanged RLE 3+/5 proximal to distal, unchanged Able to name pen and light but not stethoscope  Skin: Warm and dry.  Intact. Psychiatric: Unable to assess due to cognition  Assessment/Plan: 1. 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  Stable for D/C today F/u PCP in 3-4 weeks F/u PM&R 2 weeks See D/C summary See D/C instructions Care Tool:  Bathing    Body parts bathed by patient: Right arm, Left arm, Chest, Abdomen, Front perineal area, Buttocks, Right upper leg, Left lower leg, Right lower leg, Left upper leg, Face   Body parts bathed by helper: Left lower leg, Left arm Body parts n/a: Right arm, Left arm, Chest, Abdomen, Right upper leg, Left upper leg, Right lower leg, Left lower leg(Pt did not attempt this session.)   Bathing assist Assist Level: Minimal Assistance - Patient > 75%      Upper Body Dressing/Undressing Upper body dressing   What is the patient wearing?: Pull over shirt    Upper body assist Assist Level: Minimal Assistance - Patient > 75%    Lower Body Dressing/Undressing Lower body dressing      What is the patient wearing?: Incontinence brief, Pants     Lower body assist Assist for lower body dressing: Minimal Assistance - Patient > 75%     Toileting Toileting    Toileting assist Assist for toileting: Minimal Assistance - Patient > 75%     Transfers Chair/bed transfer  Transfers assist     Chair/bed transfer assist level: Supervision/Verbal cueing     Locomotion Ambulation   Ambulation assist   Ambulation activity did not occur: Safety/medical concerns  Assist level: Contact Guard/Touching assist Assistive device: Walker-rolling Max distance: 39ft   Walk 10 feet activity   Assist  Walk 10 feet activity did not occur: Safety/medical concerns  Assist level: Contact Guard/Touching assist Assistive device: Walker-rolling   Walk 50 feet activity   Assist Walk 50 feet with 2 turns activity did not occur: Safety/medical concerns  Assist level: Contact Guard/Touching assist Assistive device: Walker-rolling    Walk 150 feet activity   Assist Walk 150 feet activity did not occur: Safety/medical concerns         Walk 10 feet on uneven surface  activity   Assist Walk 10 feet on uneven surfaces activity did not occur: Safety/medical concerns  Wheelchair     Assist Will patient use wheelchair at discharge?: Yes Type of Wheelchair: Manual Wheelchair activity did not occur: Safety/medical concerns(Per report )  Wheelchair assist level: Minimal Assistance - Patient > 75%, Set up assist Max wheelchair distance: 79ft    Wheelchair 50 feet with 2 turns activity    Assist    Wheelchair 50 feet with 2 turns activity did not occur: Safety/medical concerns   Assist Level: Moderate Assistance -  Patient 50 - 74%   Wheelchair 150 feet activity     Assist Wheelchair 150 feet activity did not occur: Safety/medical concerns        Medical Problem List and Plan: 1.Functional and cognitive deficitssecondary to left MCA infarct  Continue CIR plan d/c 7/24. 2. Antithrombotics: -DVT/anticoagulation:Pharmaceutical:Other (comment)--Eliquis -antiplatelet therapy: Plavix 3. Pain Management:tylenol prn 4. Mood:LCSW to follow for evaluation and support. -antipsychotic agents: N/A 5. Neuropsych: This patientis notcapable of making decisions on hisown behalf. 6. Skin/Wound Care:Routine pressure relief measures 7. Fluids/Electrolytes/Nutrition:  Continue to encourage, p.o. continues to be variable/poor Improving  8. CAD: On Lipitor and Plavix --no BB due to intermittent pauses.  9. T2DM with neuropathy: Monitor BS ac/hs. Was on metformin, Glipizide and Lantus at home.   -resumed metformin bid for meal coverage, increased to 850 on 7/3. AM CBG low and creat elevated will d/c am monitor needs fluids  -increase lantus to 20 on 7/2, increased to 22 on 7/11 CBG (last 3)  Recent Labs    07/01/19 1706 07/01/19 2114 07/02/19 0633  GLUCAP 150* 219* 130*   Controlled 7/22 , off metformin due to AKI try to manage blood sugars with Lantus may add glipizide if needed will f/u with PCP as OP 10.Hypoxia: Has been weaned off oxygen. Continue to monitor for recurrent fevers. 11. LUL pulmonary nodule: follow up CT in 6-12 months 12.  Right knee pain:  Improved 13.  Hypokalemia   4.3 on 7/24 will decrease  oral supplementation  15.  History of hypertension Vitals:   07/01/19 2013 07/02/19 0456  BP: 135/80 (!) 121/55  Pulse: 79 78  Resp: 18 18  Temp: 98.6 F (37 C) 98.2 F (36.8 C)  SpO2: 100%    At this point blood pressure is in a desired range, may not be able to take enough p.o. 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  AKI resolved  LOS: 25 days A FACE TO [place] E Kirsteins 07/02/2019, 8:29 AM

## 2019-07-02 NOTE — Progress Notes (Signed)
Social Work Discharge Note   The overall goal for the admission was met for:   Discharge location: Yes - home with wife  Length of Stay: Yes - 25 days  Discharge activity level: Yes - CGA/Min A  Home/community participation: Yes  Services provided included: MD, RD, PT, OT, SLP, RN, Pharmacy and [place]: Private Insurance: Government social research officer and Other: [employer] benefits  Follow-up services arranged: Home Health: RN/OT/PT/ST, DME: 20"x18" wheelchair with basic cushion; tub transfer bench; bedside commode and Patient/Family request agency HH: [place] approved agency ([facility]), DME: [facility] overnighted DME  Comments (or additional information):  Wife received education on pt's care and feels prepared to care for him at home.  Please see progress note just prior to this note for more details.  Patient/Family verbalized understanding of follow-up arrangements: Yes  Individual responsible for coordination of the follow-up plan: pt's wife, Biagio Snelson [phone] (h); [phone] (c)  Confirmed correct DME delivered: Trey Sailors 07/02/2019    Manny Vitolo, Silvestre Mesi

## 2019-07-02 NOTE — Patient Care Conference (Signed)
Inpatient RehabilitationTeam Conference and Plan of Care Update Date: 06/30/2019   Time: 11:00 AM    Patient Name: Luke Rivera      Medical Record Number: [id number]  Date of Birth: 1941/06/28 Sex: Male         Room/Bed: 4W13C/4W13C-01 Payor Info: Payor: AET[employer] MEDICARE / Plan: AET[employer] MEDICARE HMO/PPO / Product Type: *No Product type* /    Admitting Diagnosis: 4. CVA 2 Team LT. CVA; 22-24days  Admit Date/Time:  06/07/2019  3:16 PM Admission Comments: No comment available   Primary Diagnosis:  <principal problem not specified> Principal Problem: <principal problem not specified>  Patient Active Problem List   Diagnosis Date Noted  . Labile blood glucose   . Dysphagia, post-stroke   . Labile blood pressure   . Diabetes mellitus type 2 in obese ([place])   . Acute blood loss anemia   . Hypokalemia   . Diabetic peripheral neuropathy ([place])   . Right knee pain   . Acute ischemic right MCA stroke ([place]) 06/07/2019  . AMS (altered mental status)   . Incidental pulmonary nodule, > 53mm and < 17mm 06/01/2019  . CVA (cerebral vascular accident) ([place]) 05/31/2019  . 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 Onychomycosis 01/11/2015  . Pain in lower limb 01/11/2015  . Hammer toe of right foot 01/02/2015  . Pain in right foot 01/02/2015    Expected Discharge Date: Expected Discharge Date: 07/02/19  Team Members Present: Physician leading conference: Dr. Alysia Penna Social Worker Present: Alfonse Alpers, LCSW Nurse Present: Isla Pence, RN PT Present: Michaelene Song, PT OT Present: Clyda Greener, OT SLP Present: Stormy Fabian, SLP PPS Coordinator present : Gunnar Fusi, SLP     Current Status/Progress Goal Weekly Team Focus  Medical   Patient maintaining hydration after requiring IV fluids.  Aphasia minimally improved, continues with right-sided weakness  Maintain medical stability, adequate p.o. intake for fluids and calories  Discharge planning   Bowel/Bladder   Pt is continent of B/B, with incontient episiodes at night. lBM:7/20  Remain continen tof B/B  Timed toileting Q2h/PRN   Swallow/Nutrition/ Hydration   dys 3 and thin, supervison A  Supervision  trials of regular and carryover/education of swallow startegies   ADL's   Min assist for UB bathing and dressing, Min assist for LB bathing with mod to max for LB dressing secondary to right shoe and AFO.  Min assist for toilet transfers and walk-in shower transfers.  Pt uses the RUE with bathing tasks with min to mod using the washmit and mod assist for pulling up garments when dressing.  Min A overall      Mobility   CGA-min assist for all mobility, gait 60-80 ft with RW and R AFO/knee cage  supervision-CGA  d/c planing, education, strength, balance   Communication   Min A, phrase level  Min A  education of word finding strategies and syntax organization at phrase/sentence level, reading comprehenison at sentence level, following complex commands (2-3 step)   Safety/Cognition/ Behavioral Observations            Pain   no pain  no pain  assess pain q shift and PRN Medicate as needed   Skin   healed MASD  Skin intact no issues  assess skin      *See Care Plan and progress notes for long and short-term goals.     Barriers to Discharge  Current Status/Progress Possible Resolutions Date Resolved   Physician  Medical stability     Progressing towards goals  Discharge this week      Nursing                  PT                    OT                  SLP                SW                Discharge Planning/Teaching Needs:  Wife has been alowed in per MD and has been attending therapies with pt. Can see his progress and is feeling better about taking care of pt at home.  Wife has been trained by RN and therapists on how to care for pt at home.   Team Discussion:  Pt was a little dry, so fluids run and his better now.  Encourage drinking more.  Potassium changed to p.o.  Blood sugars  look good without metformin and pt is on lantus.  RN to do training with wife for finger sticks and insulin administration.    Pt is incontinent at times mainly at night.  Watching pt's intake.  Pt is doing good with OT and is min A for UB bathing and dressing, mod A for LB dressing and min A for LB dressing.  Pt is mod A for tub txs and min A for other OT tasks.  Pt with full extension and flexion in his right arm.  Pt is CGA for txs and bed mobility with PT, gait 50', then loses form.  Wife educated to keep distances short.  Pt is on track to meet speech goals and they will trial regular textures.  Pt needs min A for expression and comprehension.  Revisions to Treatment Plan:  none    Continued Need for Acute Rehabilitation Level of Care: The patient requires daily medical management by a physician with specialized training in physical medicine and rehabilitation for the following conditions: Daily direction of a multidisciplinary physical rehabilitation program to ensure safe treatment while eliciting the highest outcome that is of practical value to the patient.: Yes Daily medical management of patient stability for increased activity during participation in an intensive rehabilitation regime.: Yes Daily analysis of laboratory values and/or radiology reports with any subsequent need for medication adjustment of medical intervention for : Neurological problems;Nutritional problems   I attest that I was present, lead the team conference, and concur with the assessment and plan of the team. Team conference was held via web/ teleconference due to [place] - 19.   Luke Rivera, Silvestre Mesi 07/02/2019, 2:24 PM

## 2019-07-02 NOTE — Progress Notes (Signed)
Social Work Patient ID: Luke Rivera, male   DOB: 1941/10/27, 78 y.o.   MRN: [id number]   CSW met with pt and pt's wife 06-30-19 after team conference.  Wife nervous about getting meds from [place].  CSW talked with Reesa Chew, PA, who stated pt will be on home meds (he has plenty at home) and only adding prescription potassium.  CSW talked with [facility], Rebbeca Paul multiple times and she will have potassium overnighted, as well as 20"x18" basic cushion for w/c [facility] already shipped to home.  CSW also added tub transfer bench and bedside commode to be delivered.  CSW talked with Mickel Baas, at [facility] about aide services, as it sounds pt has been approved for these.  Gave her necessary information.  Talked to [facility] with Dr. Angelita Ingles office today who needs documentation re: potassium and faxed this to her.  [name] very appreciative of the above and feels prepared to care for pt at home.

## 2019-07-02 NOTE — Progress Notes (Signed)
Patient and spouse received discharge instructions with understanding. Belongings gathered by wife. Discharge instructions reviewed by doctor and understood by patient and family.

## 2019-07-06 ENCOUNTER — Telehealth: Payer: Self-pay | Admitting: Registered Nurse

## 2019-07-06 NOTE — Telephone Encounter (Signed)
Transitional Care call Mrs. Botkins answer the   Patient name: Luke Rivera  DOB: August 14, 1941  1. Are you/is patient experiencing any problems since coming home? No a. Are there any questions regarding any aspect of care? No 2. Are there any questions regarding medications administration/dosing? No a. Are meds being taken as prescribed? Yes b. "Patient should review meds with caller to confirm" Medication List Reviewed. 3. Have there been any falls? No 4. Has Home Health been to the house and/or have they contacted you? Yes, [facility]. Nurse came out and performed the evaluation.  a. If not, have you tried to contact them? NA, Awaiting approval from [place] she reports. For PT/OT and Speech. She was instructed to call office if therapy not initiated, she verbalizes understanding.   b. Can we help you contact them? No 5. Are bowels and bladder emptying properly? Yes a. Are there any unexpected incontinence issues? No b. If applicable, is patient following bowel/bladder programs? NA 6. Any fevers, problems with breathing, unexpected pain? No 7. Are there any skin problems or new areas of breakdown? No 8. Has the patient/family member arranged specialty MD follow up (ie cardiology/neurology/renal/surgical/etc.)?  ( HFU appointments are scheduled except neurology. She was instructed to call [facility] and schedule HFU appointment, she verbalizes understanding.  a. Can we help arrange? No 9. Does the patient need any other services or support that we can help arrange? No 10. Are caregivers following through as expected in assisting the patient? Yes 11. Has the patient quit smoking, drinking alcohol, or using drugs as recommended? (                        )  Appointment date/time 07/08/2019  arrival time 1:15 for 1:45 Appointment with Dr. Letta Pate. At [place]

## 2019-07-08 ENCOUNTER — Encounter: Payer: Self-pay | Admitting: Physical Medicine & Rehabilitation

## 2019-07-08 ENCOUNTER — Encounter: Payer: Medicare HMO | Attending: Physical Medicine & Rehabilitation | Admitting: Physical Medicine & Rehabilitation

## 2019-07-08 ENCOUNTER — Other Ambulatory Visit: Payer: Self-pay

## 2019-07-08 VITALS — BP 115/79 | HR 94 | Temp 98.4°F | Ht 75.0 in | Wt 230.0 lb

## 2019-07-08 DIAGNOSIS — F4323 Adjustment disorder with mixed anxiety and depressed mood: Secondary | ICD-10-CM | POA: Diagnosis not present

## 2019-07-08 MED ORDER — CITALOPRAM HYDROBROMIDE 10 MG PO TABS: 10.0000 mg | ORAL_TABLET | Freq: Every day | ORAL | 5 refills | Status: DC

## 2019-07-08 MED ORDER — CITALOPRAM HYDROBROMIDE 10 MG PO TABS: 10.0000 mg | ORAL_TABLET | Freq: Every day | ORAL | 5 refills | Status: AC

## 2019-07-08 NOTE — Patient Instructions (Signed)
Citalopram tablets What is this medicine? CITALOPRAM (sye TAL oh pram) is a medicine for depression. This medicine may be used for other purposes; ask your health care provider or pharmacist if you have questions. COMMON BRAND NAME(S): Celexa What should I tell my health care provider before I take this medicine? They need to know if you have any of these conditions:  bleeding disorders  bipolar disorder or a family history of bipolar disorder  glaucoma  heart disease  history of irregular heartbeat  kidney disease  liver disease  low levels of magnesium or potassium in the blood  receiving electroconvulsive therapy  seizures  suicidal thoughts, plans, or attempt; a previous suicide attempt by you or a family member  take medicines that treat or prevent blood clots  thyroid disease  an unusual or allergic reaction to citalopram, escitalopram, other medicines, foods, dyes, or preservatives  pregnant or trying to become pregnant  breast-feeding How should I use this medicine? Take this medicine by mouth with a glass of water. Follow the directions on the prescription label. You can take it with or without food. Take your medicine at regular intervals. Do not take your medicine more often than directed. Do not stop taking this medicine suddenly except upon the advice of your doctor. Stopping this medicine too quickly may cause serious side effects or your condition may worsen. A special MedGuide will be given to you by the pharmacist with each prescription and refill. Be sure to read this information carefully each time. Talk to your pediatrician regarding the use of this medicine in children. Special care may be needed. Patients over 80 years old may have a stronger reaction and need a smaller dose. Overdosage: If you think you have taken too much of this medicine contact a poison control center or emergency room at once. NOTE: This medicine is only for you. Do not share  this medicine with others. What if I miss a dose? If you miss a dose, take it as soon as you can. If it is almost time for your next dose, take only that dose. Do not take double or extra doses. What may interact with this medicine? Do not take this medicine with any of the following medications:  certain medicines for fungal infections like fluconazole, itraconazole, ketoconazole, posaconazole, voriconazole  cisapride  dronedarone  escitalopram  linezolid  MAOIs like Carbex, Eldepryl, Marplan, Nardil, and Parnate  methylene blue (injected into a vein)  pimozide  thioridazine This medicine may also interact with the following medications:  alcohol  amphetamines  aspirin and aspirin-like medicines  carbamazepine  certain medicines for depression, anxiety, or psychotic disturbances  certain medicines for infections like chloroquine, clarithromycin, erythromycin, furazolidone, isoniazid, pentamidine  certain medicines for migraine headaches like almotriptan, eletriptan, frovatriptan, naratriptan, rizatriptan, sumatriptan, zolmitriptan  certain medicines for sleep  certain medicines that treat or prevent blood clots like dalteparin, enoxaparin, warfarin  cimetidine  diuretics  dofetilide  fentanyl  lithium  methadone  metoprolol  NSAIDs, medicines for pain and inflammation, like ibuprofen or naproxen  omeprazole  other medicines that prolong the QT interval (cause an abnormal heart rhythm)  procarbazine  rasagiline  supplements like St. Keanan's wort, kava kava, valerian  tramadol  tryptophan  ziprasidone This list may not describe all possible interactions. Give your health care provider a list of all the medicines, herbs, non-prescription drugs, or dietary supplements you use. Also tell them if you smoke, drink alcohol, or use illegal drugs. Some items may interact with  your medicine. What should I watch for while using this medicine? Tell your  doctor if your symptoms do not get better or if they get worse. Visit your doctor or health care professional for regular checks on your progress. Because it may take several weeks to see the full effects of this medicine, it is important to continue your treatment as prescribed by your doctor. Patients and their families should watch out for new or worsening thoughts of suicide or depression. Also watch out for sudden changes in feelings such as feeling anxious, agitated, panicky, irritable, hostile, aggressive, impulsive, severely restless, overly excited and hyperactive, or not being able to sleep. If this happens, especially at the beginning of treatment or after a change in dose, call your health care professional. You may get drowsy or dizzy. Do not drive, use machinery, or do anything that needs mental alertness until you know how this medicine affects you. Do not stand or sit up quickly, especially if you are an older patient. This reduces the risk of dizzy or fainting spells. Alcohol may interfere with the effect of this medicine. Avoid alcoholic drinks. Your mouth may get dry. Chewing sugarless gum or sucking hard candy, and drinking plenty of water will help. Contact your doctor if the problem does not go away or is severe. What side effects may I notice from receiving this medicine? Side effects that you should report to your doctor or health care professional as soon as possible:  allergic reactions like skin rash, itching or hives, swelling of the face, lips, or tongue  anxious  black, tarry stools  breathing problems  changes in vision  chest pain  confusion  elevated mood, decreased need for sleep, racing thoughts, impulsive behavior  eye pain  fast, irregular heartbeat  feeling faint or lightheaded, falls  feeling agitated, angry, or irritable  hallucination, loss of contact with reality  loss of balance or coordination  loss of memory  painful or prolonged  erections  restlessness, pacing, inability to keep still  seizures  stiff muscles  suicidal thoughts or other mood changes  trouble sleeping  unusual bleeding or bruising  unusually weak or tired  vomiting Side effects that usually do not require medical attention (report to your doctor or health care professional if they continue or are bothersome):  change in appetite or weight  change in sex drive or performance  dizziness  headache  increased sweating  indigestion, nausea  tremors This list may not describe all possible side effects. Call your doctor for medical advice about side effects. You may report side effects to FDA at 1-800-FDA-1088. Where should I keep my medicine? Keep out of reach of children. Store at room temperature between 15 and 30 degrees C (59 and 86 degrees F). Throw away any unused medicine after the expiration date. NOTE: This sheet is a summary. It may not cover all possible information. If you have questions about this medicine, talk to your doctor, pharmacist, or health care provider.  2020 Elsevier/Gold Standard (2018-11-16 09:05:36)  

## 2019-07-08 NOTE — Progress Notes (Signed)
Subjective:    Patient ID: Luke Rivera, male    DOB: 07-20-1941, 78 y.o.   MRN: [id number]  HPI CC:  Pt is afraid 78 year old male with history of CAD and chronic atrial fibrillation as well as type 2 diabetes with peripheral neuropathy who was admitted to [facility] on [facility]/2019 with right-sided weakness and speech problems.  His CT showed left MCA distribution infarct.  TCD negative for PFO.  Eliquis dose was increased to 5 mg twice daily.  He was admitted to rehabilitation on 06/07/2019 and discharged on 06/02/2019. [facility]  Pt very anxious about things, patient feels down and depressed.  His PHQ 9 score is elevated. Appetite is still poor Blood sugars were running in the 100s, patient is now administering his own insulin. He still needs some assistance with bathing dressing Pain Inventory Average Pain 0 Pain Right Now 0 My pain is na  In the last 24 hours, has pain interfered with the following? General activity 0 Relation with others 0 Enjoyment of life 0 What TIME of day is your pain at its worst? na Sleep (in general) Fair  Pain is worse with: na Pain improves with: na Relief from Meds: na  Mobility walk without assistance use a walker ability to climb steps?  no do you drive?  no use a wheelchair needs help with transfers  Function retired I need assistance with the following:  dressing, bathing, toileting, meal prep, household duties and shopping  Neuro/Psych bowel control problems weakness trouble walking depression anxiety  Prior Studies Any changes since last visit?  no  Physicians involved in your care Any changes since last visit?  no   Family History  Problem Relation Age of Onset  . Hypertension Father    Social History   Socioeconomic History  . Marital status: Married    Spouse name: Not on file  . Number of children: Not on file  . Years of education: Not on file  . Highest education level: Not on file   Occupational History  . Not on file  Social Needs  . Financial resource strain: Not on file  . Food insecurity    Worry: Not on file    Inability: Not on file  . Transportation needs    Medical: Not on file    Non-medical: Not on file  Tobacco Use  . Smoking status: Former Research scientist (life sciences)  . Smokeless tobacco: Never Used  Substance and Sexual Activity  . Alcohol use: Never    Alcohol/week: 0.0 standard drinks    Frequency: Never  . Drug use: Never  . Sexual activity: Not on file  Lifestyle  . Physical activity    Days per week: Not on file    Minutes per session: Not on file  . Stress: Not on file  Relationships  . Social Herbalist on phone: Not on file    Gets together: Not on file    Attends religious service: Not on file    Active member of club or organization: Not on file    Attends meetings of clubs or organizations: Not on file    Relationship status: Not on file  Other Topics Concern  . Not on file  Social History Narrative  . Not on file   No past surgical history on file. Past Medical History:  Diagnosis Date  . CAD (coronary artery disease)   . CHF (congestive heart failure) ([place])   . Chronic anticoagulation   .  DM2 (diabetes mellitus, type 2) ([place])   . DVT (deep vein thrombosis) in pregnancy   . GERD (gastroesophageal reflux disease)   . HTN (hypertension)   . TIA (transient ischemic attack)    BP 115/79   Pulse 94   Temp 98.4 F (36.9 C)   Ht 6\' 3"  (1.905 m)   Wt 230 lb (104.3 kg)   SpO2 96%   BMI 28.75 kg/m   Opioid Risk Score:   Fall Risk Score:  `1  Depression screen PHQ 2/9  No flowsheet data found.   Review of Systems  Constitutional: Negative.   HENT: Negative.   Eyes: Negative.   Respiratory: Negative.   Cardiovascular: Negative.   Gastrointestinal: Negative.   Endocrine: Negative.   Genitourinary: Negative.   Musculoskeletal: Positive for gait problem.  Skin: Negative.   Allergic/Immunologic: Negative.    Neurological: Positive for weakness.  Hematological: Negative.   Psychiatric/Behavioral: Positive for dysphoric mood. The patient is nervous/anxious.   All other systems reviewed and are negative.      Objective:   Physical Exam Vitals signs and nursing note reviewed.  Constitutional:      Appearance: Normal appearance.  HENT:     Head: Normocephalic and atraumatic.  Eyes:     Extraocular Movements: Extraocular movements intact.     Conjunctiva/sclera: Conjunctivae normal.     Pupils: Pupils are equal, round, and reactive to light.  Cardiovascular:     Rate and Rhythm: Normal rate and regular rhythm.  Pulmonary:     Effort: Pulmonary effort is normal. No respiratory distress.     Breath sounds: Normal breath sounds.  Abdominal:     General: Abdomen is flat. Bowel sounds are normal. There is no distension.  Skin:    General: Skin is warm and dry.  Neurological:     Mental Status: He is alert and oriented to person, place, and time.  Psychiatric:        Mood and Affect: Mood normal.        Behavior: Behavior normal.   Motor strength is 4/5 in the right bicep tricep grip 3- at the deltoid, 4- at the hip flexor knee extensor 3- at the ankle dorsiflexor Left side is 5/5 in the deltoid bicep tricep grip hip flexor knee extensor ankle dorsiflexor Sensation difficult to assess secondary to aphasia he was able to identify which fingers touched on the right hand.  He has difficulty identifying touch on the right lower extremity.  He has intact sensation in the left lower extremity.  Gait was not tested he does not have an assistive device with him today. Speech has impaired naming he is able to name pen as well as watch but is unable to name stethoscope.  He communicates at the phrase level he has moderate dysarthria.      Assessment & Plan:  1.  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  He is making improvements with his functional status but still requires  assistance for all ADLs mobility and has moderately impaired expressive language.  Dr Karle Starch PCP appt in Sept  Dr. Leonie Man, neurology appointment in September  [facility] Dr Roseanne Reno prescribing meds  Clelexa 10mg  daily started per PMR office, will be faxed to Dr. Roseanne Reno at the [facility] in [place]  Discussed usual timeframe of recovery, we discussed physical medicine and rehabilitation follow-up in 6 weeks.  2.  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  Make referral to Dr. Sima Matas, start citalopram 10  mg/day.  Information provided to patient on medication with side effects.

## 2019-07-13 ENCOUNTER — Telehealth: Payer: Self-pay

## 2019-07-13 NOTE — Telephone Encounter (Signed)
Patients wife called and state dr Roseanne Reno fax [phone] needs back up as to why ptn had Citaopram/Celexa added to his meds - will send med recs

## 2019-07-14 ENCOUNTER — Telehealth: Payer: Self-pay

## 2019-07-14 NOTE — Telephone Encounter (Signed)
TRLVM FOR  PTN -- REFERRAL TO [facility]

## 2019-07-30 ENCOUNTER — Other Ambulatory Visit: Payer: Self-pay | Admitting: Physical Medicine and Rehabilitation

## 2019-08-12 ENCOUNTER — Ambulatory Visit: Payer: Medicare HMO | Admitting: Adult Health

## 2019-08-12 ENCOUNTER — Encounter: Payer: Self-pay | Admitting: Adult Health

## 2019-08-12 ENCOUNTER — Other Ambulatory Visit: Payer: Self-pay

## 2019-08-12 VITALS — BP 120/67 | HR 81 | Temp 98.2°F | Ht 75.0 in | Wt 227.6 lb

## 2019-08-12 DIAGNOSIS — I63511 Cerebral infarction due to unspecified occlusion or stenosis of right middle cerebral artery: Secondary | ICD-10-CM

## 2019-08-12 DIAGNOSIS — E785 Hyperlipidemia, unspecified: Secondary | ICD-10-CM | POA: Diagnosis not present

## 2019-08-12 DIAGNOSIS — I69351 Hemiplegia and hemiparesis following cerebral infarction affecting right dominant side: Secondary | ICD-10-CM

## 2019-08-12 DIAGNOSIS — E1169 Type 2 diabetes mellitus with other specified complication: Secondary | ICD-10-CM

## 2019-08-12 DIAGNOSIS — E669 Obesity, unspecified: Secondary | ICD-10-CM

## 2019-08-12 DIAGNOSIS — I10 Essential (primary) hypertension: Secondary | ICD-10-CM | POA: Diagnosis not present

## 2019-08-12 DIAGNOSIS — I639 Cerebral infarction, unspecified: Secondary | ICD-10-CM

## 2019-08-12 DIAGNOSIS — R4701 Aphasia: Secondary | ICD-10-CM

## 2019-08-12 NOTE — Patient Instructions (Signed)
Continue to work with home health therapies and recommend participation with outpatient therapies at that time for continued improvement -you can contact our office or Dr. Letta Pate to assist you further with this  Continue clopidogrel 75 mg daily and Eliquis (apixaban) daily  and Lipitor for secondary stroke prevention  Continue to follow up with PCP regarding cholesterol, blood pressure and diabetes management   Continue to follow with cardiology as scheduled  Continue to monitor blood pressure at home  Maintain strict control of hypertension with blood pressure goal below 130/90, diabetes with hemoglobin A1c goal below 6.5% and cholesterol with LDL cholesterol (bad cholesterol) goal below 70 mg/dL. I also advised the patient to eat a healthy diet with plenty of whole grains, cereals, fruits and vegetables, exercise regularly and maintain ideal body weight.  Followup in the future with me in 2 months or call earlier if needed       Thank you for coming to see [place] at [facility]. I hope we have been able to provide you high quality care today.  You may receive a patient satisfaction survey over the next few weeks. We would appreciate your feedback and comments so that we may continue to improve ourselves and the health of our patients.

## 2019-08-12 NOTE — Progress Notes (Signed)
[facility] [address] [place]. [place] [address] [phone]       HOSPITAL FOLLOW UP NOTE  Luke Rivera Date of Birth:  January 12, 1941 Medical Record Number:  [id number]   Reason for Referral:  hospital stroke follow up    CHIEF COMPLAINT:  Chief Complaint  Patient presents with   Hospitalization Follow-up    Rm treatment.  Therapy PT/OT/ST.  [facility].  Wife in room.    Cerebrovascular Accident    HPI: Luke Rivera being seen today for in office hospital follow-up regarding left MCA infarct on 05/31/2019.  History obtained from patient, wife and chart review. Reviewed all radiology images and labs personally.  Luke Rivera is a 78 y.o. 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  Neurology consulted with stroke work-up revealing left MCA infarct embolic pattern as evidenced on MRI secondary to unknown source.  Question possible PFO with finding of DVT versus other cardioembolic source (AF most likely).  CTA head/neck negative for LVO but did show left ICA 65 to 70% stenosis, right ICA 50% stenosis, hypoplastic right VA with reconstitution of PT, dominant left VA, mild distal left V4 50%, and diffuse small vessel disease anterior circulation.  MRI did show evidence of acute left posterior frontal MCA infarct along with chronic left frontal MCA infarct.  2D echo normal EF without cardiac source of embolism.  TCD bubble negative for PFO.  Prior history of DVT for which she was previously on clopidogrel 75 mg daily and Eliquis and recommended increased dose of Eliquis 5 mg twice daily and continuation of Plavix.  HTN stable.  LDL 49 and recommended continuation of statin.  Uncontrolled DM with A1c 7.3.  Other stroke risk factors include advanced age, former tobacco use, obesity and CAD status post stents on Plavix prior to admission.  Residual right hemiparesis and aphasia and was  discharged to CIR for ongoing therapy.  He was discharged home on 07/02/2019 with recommendation of home therapies.  [name] is being seen today for stroke follow-up accompanied by his wife.  Continues to have residual deficits of right hemiparesis, expressive aphasia and post stroke depression.  His wife endorses great improvement with ongoing participation in home PT/OT/ST.  Ambulating with rolling walker but does use wheelchair for long distance transferring.  Initial difficulty with depression but was started on Celexa 10 mg daily with improvement.  Denies ongoing difficulties with depression or anxiety.  He has continued on Eliquis 5 mg twice daily and clopidogrel without bleeding or bruising.  Continues on atorvastatin 80 mg daily without myalgias.  Blood pressure today 120/67.  He continues to follow with PCP regularly for HTN, HLD and DM management.  He continues to follow with cardiology regularly.  Denies new or worsening stroke/TIA symptoms.    ROS:   14 system review of systems performed and negative with exception of see HPI  PMH:  Past Medical History:  Diagnosis Date   CAD (coronary artery disease)    CHF (congestive heart failure) (HCC)    Chronic anticoagulation    DM2 (diabetes mellitus, type 2) (HCC)    DVT (deep vein thrombosis) in pregnancy    GERD (gastroesophageal reflux disease)    HTN (hypertension)    TIA (transient ischemic attack)     PSH: History reviewed. No pertinent surgical history.  Social History:  Social History   Socioeconomic History   Marital status: Married  Spouse name: Not on file   Number of children: Not on file   Years of education: Not on file   Highest education level: Not on file  Occupational History   Not on file  Social Needs   Financial resource strain: Not on file   Food insecurity    Worry: Not on file    Inability: Not on file   Transportation needs    Medical: Not on file    Non-medical: Not on file    Tobacco Use   Smoking status: Former Smoker   Smokeless tobacco: Never Used  Substance and Sexual Activity   Alcohol use: Never    Alcohol/week: 0.0 standard drinks    Frequency: Never   Drug use: Never   Sexual activity: Not on file  Lifestyle   Physical activity    Days per week: Not on file    Minutes per session: Not on file   Stress: Not on file  Relationships   Social connections    Talks on phone: Not on file    Gets together: Not on file    Attends religious service: Not on file    Active member of club or organization: Not on file    Attends meetings of clubs or organizations: Not on file    Relationship status: Not on file   Intimate partner violence    Fear of current or ex partner: Not on file    Emotionally abused: Not on file    Physically abused: Not on file    Forced sexual activity: Not on file  Other Topics Concern   Not on file  Social History Narrative   Not on file    Family History:  Family History  Problem Relation Age of Onset   Hypertension Father    Heart disease Mother    Heart attack Brother     Medications:   Current Outpatient Medications on File Prior to Visit  Medication Sig Dispense Refill   acetaminophen (TYLENOL) 325 MG tablet Take 1-2 tablets (325-650 mg total) by mouth every 4 (four) hours as needed for mild pain.     apixaban (ELIQUIS) 5 MG TABS tablet Take 1 tablet (5 mg total) by mouth 2 (two) times daily. 60 tablet    atorvastatin (LIPITOR) 80 MG tablet Take 40 mg by mouth at bedtime.      citalopram (CELEXA) 10 MG tablet Take 1 tablet (10 mg total) by mouth daily. 30 tablet 5   clopidogrel (PLAVIX) 75 MG tablet Take 1 tablet (75 mg total) by mouth daily. 30 tablet 1   diclofenac sodium (VOLTAREN) 1 % GEL Apply 2 g topically 4 (four) times daily. 350 g 1   insulin glargine (LANTUS) 100 UNIT/ML injection Inject 0.22 mLs (22 Units total) into the skin at bedtime. 10 mL 11   loperamide (IMODIUM A-D) 2 MG  tablet Take 1 tablet (2 mg total) by mouth 4 (four) times daily as needed for diarrhea or loose stools. 30 tablet 0   Multiple Vitamin (MULTIVITAMIN WITH MINERALS) TABS tablet Take 1 tablet by mouth daily.     nitroGLYCERIN (NITROSTAT) 0.6 MG SL tablet Place 0.6 mg under the tongue as needed for chest pain.     pantoprazole (PROTONIX) 40 MG tablet Take 40 mg by mouth daily.     potassium chloride SA (K-DUR) 20 MEQ tablet Take 1 tablet (20 mEq total) by mouth daily.     No current facility-administered medications on file prior to visit.  Allergies:  No Known Allergies   Physical Exam  Vitals:   08/12/19 1245  BP: 120/67  Pulse: 81  Temp: 98.2 F (36.8 C)  Weight: 227 lb 9.6 oz (103.2 kg)  Height: 6\' 3"  (1.905 m)   Body mass index is 28.45 kg/m. No exam data present  Depression screen [facility] 2/9 08/16/2019  Decreased Interest 0  Down, Depressed, Hopeless 0  PHQ - 2 Score 0     General: well developed, well nourished,  pleasant elderly Caucasian male, seated, in no evident distress Head: head normocephalic and atraumatic.   Neck: supple with no carotid or supraclavicular bruits Cardiovascular: regular rate and rhythm, no murmurs Musculoskeletal: no deformity Skin:  no rash/petichiae Vascular:  Normal pulses all extremities   Neurologic Exam Mental Status: Awake and fully alert.   Expressive aphasia.  Oriented to place and time. Recent and remote memory intact. Attention span, concentration and fund of knowledge appropriate. Mood and affect appropriate.  Cranial Nerves: Fundoscopic exam reveals sharp disc margins. Pupils equal, briskly reactive to light. Extraocular movements full without nystagmus. Visual fields full to confrontation. Hearing intact. Facial sensation intact.  Right lower facial weakness. Motor:  RUE: 3+-4/5 greatest in bicep and tricep RLE: 4/5 hip flexor with weak ankle dorsiflexion; AFO in place Full strength left upper and lower extremity Sensory.:  intact to touch , pinprick , position and vibratory sensation.  Coordination: Rapid alternating movements diminished on right hand. Finger-to-nose and heel-to-shin performed accurately on left side with difficulty performing on right side due to weakness. Gait and Station: Deferred as currently in wheelchair without rolling walker Reflexes: 1+ and symmetric. Toes downgoing.    NIHSS  3 Modified Rankin  3    Diagnostic Data (Labs, Imaging, Testing)   Code Stroke CT head possible hypodensity left mid frontal super ganglionic territory.  Old left frontal infarct.  Age-related small vessel disease and atrophy.  ASPECTS 9    CTA head & neck no LVO.  L ICA 65 to 70%.  R ICA 50%.  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  Dominant L VA.  Mid distal L V4 50%.  Diffuse small vessel disease anterior circulation.  Emphysema.  6 mm LUL nodule, indeterminate.  MRI acute left posterior frontal MCA infarct.  Chronic left frontal MCA infarct as well.    2D Echo normal ejection fraction no cardiac source of embolism.  TCD bubble  Negative for PFO.   LDL 49  HgbA1c 7.3    ASSESSMENT: Luke Rivera is a 78 y.o. year old male presented with garbled speech and right sided weakness on 05/31/19 with stroke work up showing left MCA infarct embolic pattern secondary to unknown source. 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  Residual deficits of right hemiparesis, right facial droop and expressive aphasia    PLAN:  1. Left MCA stroke : Continue clopidogrel 75 mg daily and Eliquis (apixaban) daily  and lipitor  for secondary stroke prevention. Maintain strict control of hypertension with blood pressure goal below 130/90, diabetes with hemoglobin A1c goal below 6.5% and cholesterol with LDL cholesterol (bad cholesterol) goal below 70 mg/dL.  I also advised the patient to eat a healthy diet with plenty of whole grains, cereals,  fruits and vegetables, exercise regularly with at least 30 minutes of continuous activity daily and maintain ideal body weight. 2. Residual deficits: Ongoing participation in home health PT/OT/ST and recommend transitioning to outpatient therapy  once HH completed 3. Cardiology: Continue Eliquis 5 mg twice daily due to potential prior episode of atrial fibrillation and unprovoked DVT.  He will continue on Plavix due to history of CAD with stents.  Wife believes previously had cardiac monitor but unsure of results.  Advised to continue to follow with current cardiologist who will further determine need of cardiac monitoring to assess for underlying atrial fibrillation.  No indication at this time from neurological perspective as currently on Eliquis for DVT 4. HTN: Advised to continue current treatment regimen.  Today's BP stable.  Advised to continue to monitor at home along with continued follow-up with PCP for management 5. HLD: Advised to continue current treatment regimen along with continued follow-up with PCP for future prescribing and monitoring of lipid panel 6. DMII: Advised to continue to monitor glucose levels at home along with continued follow-up with PCP for management and monitoring 7. Reactive depression: Ongoing use of Celexa 10 mg daily with improvement of depression symptoms.  Will continue to be monitored and managed by PCP    Follow up in 2 months or call earlier if needed   Greater than 50% of time during this 45 minute visit was spent on counseling, explanation of diagnosis of left MCA stroke, reviewing risk factor management of HTN, HLD, DM, DVT and CAD, planning of further management along with potential future management, and discussion with patient and family answering all questions.    Frann Rider, AGNP-BC  [facility] [address] [place], [place] [address]  Phone [phone] Fax [phone] Note: This document was prepared  with digital dictation and possible smart phrase technology. Any transcriptional errors that result from this process are unintentional.

## 2019-08-16 ENCOUNTER — Encounter: Payer: Self-pay | Admitting: Adult Health

## 2019-08-18 NOTE — Progress Notes (Signed)
I agree with the above plan 

## 2019-08-19 ENCOUNTER — Encounter: Payer: Medicare HMO | Attending: Physical Medicine & Rehabilitation | Admitting: Physical Medicine & Rehabilitation

## 2019-08-19 ENCOUNTER — Encounter: Payer: Self-pay | Admitting: Physical Medicine & Rehabilitation

## 2019-08-19 ENCOUNTER — Other Ambulatory Visit: Payer: Self-pay

## 2019-08-19 VITALS — BP 121/72 | HR 63 | Temp 98.0°F | Ht 75.0 in | Wt 227.0 lb

## 2019-08-19 DIAGNOSIS — M7501 Adhesive capsulitis of right shoulder: Secondary | ICD-10-CM | POA: Diagnosis not present

## 2019-08-19 DIAGNOSIS — F4323 Adjustment disorder with mixed anxiety and depressed mood: Secondary | ICD-10-CM | POA: Diagnosis present

## 2019-08-19 DIAGNOSIS — I69359 Hemiplegia and hemiparesis following cerebral infarction affecting unspecified side: Secondary | ICD-10-CM

## 2019-08-19 NOTE — Progress Notes (Signed)
Subjective:    Patient ID: Luke Rivera, male    DOB: 03-17-1941, 78 y.o.   MRN: [id number] 78 year old male with history of CAD and chronic atrial fibrillation as well as type 2 diabetes with peripheral neuropathy who was admitted to [facility] on 05/31/2019 with right-sided weakness and speech problems.  His CT showed left MCA distribution infarct.  TCD negative for PFO.  Eliquis dose was increased to 5 mg twice daily.   HPI  Patient returns for stroke rehab follow-up.  His wife's main concern is that he has right hand pain.  The patient indicates that the pain is at night as well as during the day. He has had no injury to the right hand.  He has had no swelling.  There is no hypersensitivity to touch over the skin.  He also has right shoulder pain but this is mainly with movement. No right hemifacial pain no right lower extremity pain. Citalopram is helping  PT, OT, SLP twice a week, home health Has not worked with steps because he only has 1 step to get in the house. The wife would like him to be able to go up multiple steps because this is what is needed to visit their children Pain Inventory Average Pain 3 Pain Right Now 3 My pain is dull and aching  In the last 24 hours, has pain interfered with the following? General activity 3 Relation with others 2 Enjoyment of life 1 What TIME of day is your pain at its worst? evening Sleep (in general) Good  Pain is worse with: some activites Pain improves with: heat/ice and medication Relief from Meds: 9  Mobility use a walker ability to climb steps?  no do you drive?  no use a wheelchair needs help with transfers  Function retired  Neuro/Psych bowel control problems weakness  Prior Studies Any changes since last visit?  no  Physicians involved in your care Any changes since last visit?  no   Family History  Problem Relation Age of Onset  . Hypertension Father   . Heart disease Mother   . Heart attack Brother     Social History   Socioeconomic History  . Marital status: Married    Spouse name: Not on file  . Number of children: Not on file  . Years of education: Not on file  . Highest education level: Not on file  Occupational History  . Not on file  Social Needs  . Financial resource strain: Not on file  . Food insecurity    Worry: Not on file    Inability: Not on file  . Transportation needs    Medical: Not on file    Non-medical: Not on file  Tobacco Use  . Smoking status: Former Research scientist (life sciences)  . Smokeless tobacco: Never Used  Substance and Sexual Activity  . Alcohol use: Never    Alcohol/week: 0.0 standard drinks    Frequency: Never  . Drug use: Never  . Sexual activity: Not on file  Lifestyle  . Physical activity    Days per week: Not on file    Minutes per session: Not on file  . Stress: Not on file  Relationships  . Social Herbalist on phone: Not on file    Gets together: Not on file    Attends religious service: Not on file    Active member of club or organization: Not on file    Attends meetings of clubs or organizations: Not on  file    Relationship status: Not on file  Other Topics Concern  . Not on file  Social History Narrative  . Not on file   No past surgical history on file. Past Medical History:  Diagnosis Date  . CAD (coronary artery disease)   . CHF (congestive heart failure) ([place])   . Chronic anticoagulation   . DM2 (diabetes mellitus, type 2) ([place])   . DVT (deep vein thrombosis) in pregnancy   . GERD (gastroesophageal reflux disease)   . HTN (hypertension)   . TIA (transient ischemic attack)    BP 121/72   Pulse 63   Temp 98 F (36.7 C)   Ht 6\' 3"  (1.905 m)   Wt 227 lb (103 kg)   SpO2 97%   BMI 28.37 kg/m   Opioid Risk Score:   Fall Risk Score:  `1  Depression screen PHQ 2/9  Depression screen PHQ 2/9 08/16/2019  Decreased Interest 0  Down, Depressed, Hopeless 0  PHQ - 2 Score 0     Review of Systems  Constitutional:  Negative.   HENT: Negative.   Eyes: Negative.   Respiratory: Negative.   Cardiovascular: Negative.   Gastrointestinal: Negative.   Endocrine: Negative.   Genitourinary: Negative.   Musculoskeletal: Negative.   Skin: Negative.   Allergic/Immunologic: Negative.   Neurological: Positive for weakness.  Hematological: Negative.   Psychiatric/Behavioral: Negative.   All other systems reviewed and are negative.      Objective:   Physical Exam Vitals signs and nursing note reviewed.  Constitutional:      Appearance: Normal appearance.  Skin:    General: Skin is warm and dry.  Neurological:     General: No focal deficit present.     Mental Status: He is alert and oriented to person, place, and time.   Motor strength is 3- at the right deltoid 3+ biceps triceps finger flexors and extensors 4- right knee extensor 3- hip flexor 3- ankle dorsiflexor plantar flexor Sensation difficult to assess secondary to cognition and aphasia.  Appears to have decreased sensation on the right side compared to left side to light touch.  Right hand is without swelling there is pain to palpation over the MCPs but not over the PIPs or DIPs there is no evidence of effusion at the right wrist.  Mild pain with supination pronation at the wrist.  No pain with elbow range of motion there is positive pain with shoulder internal rotation greater than with external rotation.  There is also pain with abduction but this is once he gets greater than 100 degrees.        Assessment & Plan:  1.  Left MCA distribution infarct with aphasia and right hemiparesis.  His spasticity is not severe.  He is making gains with his physical therapy.  We will continue with home health but then transition to outpatient therapy 2.  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 3.  Hemiplegic  shoulder pain likely multifactorial has decreased range of motion some element of adhesive capsulitis but also some subacromial impingement syndrome.  We will do glenohumeral injection today  Shoulder injectionRight glenohumeral    Indication:RIght Shoulder pain not relieved by medication management and other conservative care.  Informed consent was obtained after describing risks and benefits of the procedure with the patient, this includes bleeding, bruising, infection and medication side  effects. The patient wishes to proceed and has given written consent. Patient was placed in a seated position. The RIght shoulder was marked and prepped with betadine in the subacromial area. A 25-gauge 1-1/2 inch needle was inserted into the subacromial area. After negative draw back for blood, a solution containing 1 mL of 6 mg per ML betamethasone and 4 mL of 1% lidocaine was injected. A band aid was applied. The patient tolerated the procedure well. Post procedure instructions were given.

## 2019-08-19 NOTE — Patient Instructions (Addendum)
YOU MAY USE ACETAMINOPHEN UP TO 2600MG  PER DAY  IF YOU BUY 500MG  TAKE NO MORE THAN 5 TABS PER DAY  IF YOU BUY 325MG  , TAKE NO MORE THAN 8 TABS PER DAY  USE VOLTAREN GEL 4 TIMES A DAY TO HAND

## 2019-08-30 ENCOUNTER — Other Ambulatory Visit: Payer: Self-pay

## 2019-08-30 MED ORDER — POTASSIUM CHLORIDE CRYS ER 20 MEQ PO TBCR: 20.0000 meq | EXTENDED_RELEASE_TABLET | Freq: Every day | ORAL | 0 refills | Status: DC

## 2019-09-13 ENCOUNTER — Telehealth: Payer: Self-pay

## 2019-09-13 ENCOUNTER — Telehealth: Payer: Self-pay | Admitting: Adult Health

## 2019-09-13 MED ORDER — TIZANIDINE HCL 2 MG PO TABS: 2.0000 mg | ORAL_TABLET | Freq: Every day | ORAL | 1 refills | Status: DC

## 2019-09-13 NOTE — Telephone Encounter (Signed)
Pt wife(not on DPR) she is asking the RN calls pt to discuss pt's right leg jumping thru the night, this is the side of the stroke.  Please call pt

## 2019-09-13 NOTE — Telephone Encounter (Signed)
Patients wife [name] called stating patients right leg jerking, kept up all night, happens when lying down. Please advise.

## 2019-09-13 NOTE — Telephone Encounter (Signed)
LMVM for pt to return call for sx of restless leg from stroke.

## 2019-09-13 NOTE — Telephone Encounter (Signed)
Called wife and notified.

## 2019-09-13 NOTE — Telephone Encounter (Signed)
This is likely stroke related spasticity please call in Zanaflex 2 mg nightly #30, 1 refill

## 2019-09-14 NOTE — Telephone Encounter (Signed)
LMVM for wife that returning call.  Please call back if needed.

## 2019-09-14 NOTE — Telephone Encounter (Signed)
Pts wife returned call, she states she got a hold of Dr.Kirstin and he prescribed Tizanidine 2mg  1 tab at night , she states its been helpful with the jerking in his leg. She states you can call back for more info if needed

## 2019-09-19 ENCOUNTER — Telehealth: Payer: Self-pay | Admitting: Neurology

## 2019-09-19 NOTE — Telephone Encounter (Signed)
Agree with plan 

## 2019-09-19 NOTE — Telephone Encounter (Signed)
The patient had a stroke on 31 May 2019.  He has done well, he is at home.  The patient had one episode of nausea and vomiting 3 days ago, he has done well since that time, he has been eating and drinking well.  However, the patient has not had a bowel movement in 3 days.  He is straining to have a bowel movement this afternoon, he had another episode of nausea and vomiting.  He reports no abdominal pain or headache.  The wife is concerned that he is on blood thinners.  She has gotten magnesium citrate from the pharmacy, she wonders whether or not to give it to the patient.  I have indicated that it is okay to do this, if he continues to have vomiting, he may need to be seen through a primary care physician.  There is no evidence at this time that he is having any new neurologic issue.  The patient gets most of his primary care through the [facility] hospital system, they are in the process of switching from [place] to [place].

## 2019-09-23 ENCOUNTER — Other Ambulatory Visit: Payer: Self-pay | Admitting: Physical Medicine & Rehabilitation

## 2019-09-29 ENCOUNTER — Ambulatory Visit (INDEPENDENT_AMBULATORY_CARE_PROVIDER_SITE_OTHER): Payer: Medicare HMO | Admitting: Adult Health

## 2019-09-29 ENCOUNTER — Encounter: Payer: Self-pay | Admitting: Adult Health

## 2019-09-29 ENCOUNTER — Other Ambulatory Visit: Payer: Self-pay

## 2019-09-29 VITALS — BP 112/68 | HR 68 | Temp 98.0°F | Ht 75.0 in | Wt 227.8 lb

## 2019-09-29 DIAGNOSIS — I639 Cerebral infarction, unspecified: Secondary | ICD-10-CM

## 2019-09-29 DIAGNOSIS — E1169 Type 2 diabetes mellitus with other specified complication: Secondary | ICD-10-CM | POA: Diagnosis not present

## 2019-09-29 DIAGNOSIS — I63511 Cerebral infarction due to unspecified occlusion or stenosis of right middle cerebral artery: Secondary | ICD-10-CM | POA: Diagnosis not present

## 2019-09-29 DIAGNOSIS — E785 Hyperlipidemia, unspecified: Secondary | ICD-10-CM | POA: Diagnosis not present

## 2019-09-29 DIAGNOSIS — I10 Essential (primary) hypertension: Secondary | ICD-10-CM

## 2019-09-29 DIAGNOSIS — R4701 Aphasia: Secondary | ICD-10-CM

## 2019-09-29 DIAGNOSIS — I69351 Hemiplegia and hemiparesis following cerebral infarction affecting right dominant side: Secondary | ICD-10-CM

## 2019-09-29 DIAGNOSIS — E669 Obesity, unspecified: Secondary | ICD-10-CM

## 2019-09-29 NOTE — Patient Instructions (Signed)
Referral placed to neuro rehab PT/OT/ST -you can call and schedule visit if you do not hear from them by beginning of next week  Continue clopidogrel 75 mg daily and Eliquis (apixaban) daily  and Lipitor for secondary stroke prevention  Continue to follow up with PCP regarding cholesterol, blood pressure and diabetes management   Continue to follow with Dr. Letta Pate as scheduled along with possible further treatment options of right hand pain  Continue to monitor blood pressure at home  Maintain strict control of hypertension with blood pressure goal below 130/90, diabetes with hemoglobin A1c goal below 6.5% and cholesterol with LDL cholesterol (bad cholesterol) goal below 70 mg/dL. I also advised the patient to eat a healthy diet with plenty of whole grains, cereals, fruits and vegetables, exercise regularly and maintain ideal body weight.  Followup in the future with me in 4 months or call earlier if needed       Thank you for coming to see [place] at [facility]. I hope we have been able to provide you high quality care today.  You may receive a patient satisfaction survey over the next few weeks. We would appreciate your feedback and comments so that we may continue to improve ourselves and the health of our patients.

## 2019-09-29 NOTE — Progress Notes (Signed)
[facility] [address] [place]. [place] [address] [phone]       HOSPITAL FOLLOW UP NOTE  [name] Date of Birth:  20-Mar-1941 Medical Record Number:  [id number]   Reason for Referral:  hospital stroke follow up    CHIEF COMPLAINT:  Chief Complaint  Patient presents with   Follow-up    Treatment room, No concerns    HPI: Stroke admission 05/31/2019: [name] is a 78 y.o. 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  Neurology consulted with stroke work-up revealing left MCA infarct embolic pattern as evidenced on MRI secondary to unknown source.  Question possible PFO with finding of DVT versus other cardioembolic source (AF most likely).  CTA head/neck negative for LVO but did show left ICA 65 to 70% stenosis, right ICA 50% stenosis, hypoplastic right VA with reconstitution of PT, dominant left VA, mild distal left V4 50%, and diffuse small vessel disease anterior circulation.  MRI did show evidence of acute left posterior frontal MCA infarct along with chronic left frontal MCA infarct.  2D echo normal EF without cardiac source of embolism.  TCD bubble negative for PFO.  Prior history of DVT for which she was previously on clopidogrel 75 mg daily and Eliquis and recommended increased dose of Eliquis 5 mg twice daily and continuation of Plavix.  HTN stable.  LDL 49 and recommended continuation of statin.  Uncontrolled DM with A1c 7.3.  Other stroke risk factors include advanced age, former tobacco use, obesity and CAD status post stents on Plavix prior to admission.  Residual right hemiparesis and aphasia and was discharged to CIR for ongoing therapy.  He was discharged home on 07/02/2019 with recommendation of home therapies.  Initial visit 05/12/2019: [name] is being seen today for stroke follow-up accompanied by his wife.  Continues to have residual deficits of right  hemiparesis, expressive aphasia and post stroke depression.  His wife endorses great improvement with ongoing participation in home PT/OT/ST.  Ambulating with rolling walker but does use wheelchair for long distance transferring.  Initial difficulty with depression but was started on Celexa 10 mg daily with improvement.  Denies ongoing difficulties with depression or anxiety.  He has continued on Eliquis 5 mg twice daily and clopidogrel without bleeding or bruising.  Continues on atorvastatin 80 mg daily without myalgias.  Blood pressure today 120/67.  He continues to follow with PCP regularly for HTN, HLD and DM management.  He continues to follow with cardiology regularly.  Denies new or worsening stroke/TIA symptoms.  Update 09/29/2019: [name] is being seen today for stroke follow-up accompanied by his wife.  Residual deficits improving including right hemiparesis and expressive aphasia.  He continues to participate in home health therapies with recent completion of the PT and will have a couple more sessions of OT and ST.  Patient is interested in participating in outpatient therapy.  He continues to ambulate with rolling walker and will use wheelchair for appointments only.  Was having increased spasticity on left side and was started on tizanidine by Dr. Dianna Limbo.  Depression has been stable with ongoing use of Celexa.  Continues on Eliquis and Plavix for secondary stroke prevention with side effects.  Continues on atorvastatin without myalgias.  Blood pressure today satisfactory 112/68.  Wife does endorse episode of increased fatigue and decreased consciousness after episode of bearing down for bowel movement due to constipation associated with diaphoresis.  Denies  worsening stroke symptoms.  She now monitors bowel movements regularly.  No further concerns at this time.    ROS:   14 system review of systems performed and negative with exception of speech difficulty, weakness, gait difficulty and  depression  PMH:  Past Medical History:  Diagnosis Date   CAD (coronary artery disease)    CHF (congestive heart failure) (HCC)    Chronic anticoagulation    DM2 (diabetes mellitus, type 2) (HCC)    DVT (deep vein thrombosis) in pregnancy    GERD (gastroesophageal reflux disease)    HTN (hypertension)    TIA (transient ischemic attack)     PSH: No past surgical history on file.  Social History:  Social History   Socioeconomic History   Marital status: Married    Spouse name: Not on file   Number of children: Not on file   Years of education: Not on file   Highest education level: Not on file  Occupational History   Not on file  Social Needs   Financial resource strain: Not on file   Food insecurity    Worry: Not on file    Inability: Not on file   Transportation needs    Medical: Not on file    Non-medical: Not on file  Tobacco Use   Smoking status: Former Smoker   Smokeless tobacco: Never Used  Substance and Sexual Activity   Alcohol use: Never    Alcohol/week: 0.0 standard drinks    Frequency: Never   Drug use: Never   Sexual activity: Not on file  Lifestyle   Physical activity    Days per week: Not on file    Minutes per session: Not on file   Stress: Not on file  Relationships   Social connections    Talks on phone: Not on file    Gets together: Not on file    Attends religious service: Not on file    Active member of club or organization: Not on file    Attends meetings of clubs or organizations: Not on file    Relationship status: Not on file   Intimate partner violence    Fear of current or ex partner: Not on file    Emotionally abused: Not on file    Physically abused: Not on file    Forced sexual activity: Not on file  Other Topics Concern   Not on file  Social History Narrative   Not on file    Family History:  Family History  Problem Relation Age of Onset   Hypertension Father    Heart disease Mother     Heart attack Brother     Medications:   Current Outpatient Medications on File Prior to Visit  Medication Sig Dispense Refill   acetaminophen (TYLENOL) 325 MG tablet Take 1-2 tablets (325-650 mg total) by mouth every 4 (four) hours as needed for mild pain.     apixaban (ELIQUIS) 5 MG TABS tablet Take 1 tablet (5 mg total) by mouth 2 (two) times daily. 60 tablet    atorvastatin (LIPITOR) 80 MG tablet Take 40 mg by mouth at bedtime.      citalopram (CELEXA) 10 MG tablet Take 1 tablet (10 mg total) by mouth daily. 30 tablet 5   clopidogrel (PLAVIX) 75 MG tablet Take 1 tablet (75 mg total) by mouth daily. 30 tablet 1   diclofenac sodium (VOLTAREN) 1 % GEL Apply 2 g topically 4 (four) times daily. 350 g 1   insulin  glargine (LANTUS) 100 UNIT/ML injection Inject 0.22 mLs (22 Units total) into the skin at bedtime. 10 mL 11   loperamide (IMODIUM A-D) 2 MG tablet Take 1 tablet (2 mg total) by mouth 4 (four) times daily as needed for diarrhea or loose stools. 30 tablet 0   Multiple Vitamin (MULTIVITAMIN WITH MINERALS) TABS tablet Take 1 tablet by mouth daily.     nitroGLYCERIN (NITROSTAT) 0.6 MG SL tablet Place 0.6 mg under the tongue as needed for chest pain.     pantoprazole (PROTONIX) 40 MG tablet Take 40 mg by mouth daily.     potassium chloride SA (K-DUR) 20 MEQ tablet Take 1 tablet (20 mEq total) by mouth daily. 30 tablet 0   tiZANidine (ZANAFLEX) 2 MG tablet Take 1 tablet (2 mg total) by mouth at bedtime. 30 tablet 1   No current facility-administered medications on file prior to visit.     Allergies:  No Known Allergies   Physical Exam  Vitals:   09/29/19 1429  BP: 112/68  Pulse: 68  Temp: 98 F (36.7 C)  Weight: 227 lb 12.8 oz (103.3 kg)  Height: 6\' 3"  (1.905 m)   Body mass index is 28.47 kg/m. No exam data present  General: well developed, well nourished,  pleasant elderly Caucasian male, seated, in no evident distress Head: head normocephalic and atraumatic.     Neck: supple with no carotid or supraclavicular bruits Cardiovascular: regular rate and rhythm, no murmurs Musculoskeletal: no deformity; decreased right hand flexion Skin:  no rash/petichiae Vascular:  Normal pulses all extremities   Neurologic Exam Mental Status: Awake and fully alert.   Mild to moderate expressive aphasia.  Oriented to place and time. Recent and remote memory intact. Attention span, concentration and fund of knowledge appropriate. Mood and affect appropriate.  Cranial Nerves: Fundoscopic exam reveals sharp disc margins. Pupils equal, briskly reactive to light. Extraocular movements full without nystagmus. Visual fields full to confrontation. Hearing intact. Facial sensation intact.  Mild right lower facial weakness. Motor:  RUE: 4-/5 greatest in bicep and tricep and increased tone RLE: 4+/5 hip flexor with weak ankle dorsiflexion; AFO in place Full strength left upper and lower extremity Sensory.: intact to touch , pinprick , position and vibratory sensation.  Coordination: Rapid alternating movements diminished on right hand. Finger-to-nose and heel-to-shin performed accurately on left side with difficulty performing on right side due to weakness. Gait and Station: Deferred as currently in wheelchair without rolling walker Reflexes: 2+ right upper and lower extremity and 1+ left upper and lower extremity. Toes downgoing.      Diagnostic Data (Labs, Imaging, Testing)   Code Stroke CT head possible hypodensity left mid frontal super ganglionic territory.  Old left frontal infarct.  Age-related small vessel disease and atrophy.  ASPECTS 9    CTA head & neck no LVO.  L ICA 65 to 70%.  R ICA 50%.  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  Dominant L VA.  Mid distal L V4 50%.  Diffuse small vessel disease anterior circulation.  Emphysema.  6 mm LUL nodule, indeterminate.  MRI acute left posterior frontal MCA infarct.  Chronic left frontal MCA infarct as well.    2D  Echo normal ejection fraction no cardiac source of embolism.  TCD bubble  Negative for PFO.   LDL 49  HgbA1c 7.3    ASSESSMENT: Luke Rivera is a 78 y.o. year old male presented with garbled speech and right sided weakness on 05/31/19 with stroke work up  showing left MCA infarct embolic pattern secondary to unknown source. 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  Residual deficits of right hemiparesis, right facial droop and expressive aphasia with ongoing improvement    PLAN:  1. Left MCA stroke : Continue clopidogrel 75 mg daily and Eliquis (apixaban) daily  and lipitor  for secondary stroke prevention. Maintain strict control of hypertension with blood pressure goal below 130/90, diabetes with hemoglobin A1c goal below 6.5% and cholesterol with LDL cholesterol (bad cholesterol) goal below 70 mg/dL.  I also advised the patient to eat a healthy diet with plenty of whole grains, cereals, fruits and vegetables, exercise regularly with at least 30 minutes of continuous activity daily and maintain ideal body weight. 2. Residual deficits: Referral placed to neuro rehab PT/OT/ST as home health therapies will be completed over the next week.  He will continue to follow with Dr. Dianna Limbo as scheduled 3. Cardiology: Continue Eliquis 5 mg twice daily due to potential prior episode of atrial fibrillation and unprovoked DVT.  He will continue on Plavix due to history of CAD with stents.  Continue to follow with cardiology regularly 4. HTN: Advised to continue current treatment regimen.  Today's BP stable.  Advised to continue to monitor at home along with continued follow-up with PCP for management 5. HLD: Advised to continue current treatment regimen along with continued follow-up with PCP for future prescribing and monitoring of lipid panel 6. DMII: Advised to continue to monitor glucose levels at home along with continued  follow-up with PCP for management and monitoring 7. Reactive depression: Ongoing use of Celexa 10 mg daily with improvement of depression symptoms.  Will continue to be monitored and managed by PCP    Follow up in 4 months or call earlier if needed   Greater than 50% of time during this 25 minute visit was spent on counseling, explanation of diagnosis of left MCA stroke, reviewing risk factor management of HTN, HLD, DM, DVT and CAD, planning of further management along with potential future management, and discussion with patient and family answering all questions.    Frann Rider, AGNP-BC  [facility] [address] [place], [place] [address]  Phone [phone] Fax [phone] Note: This document was prepared with digital dictation and possible smart phrase technology. Any transcriptional errors that result from this process are unintentional.

## 2019-09-30 ENCOUNTER — Ambulatory Visit: Payer: Medicare HMO | Admitting: Physical Medicine & Rehabilitation

## 2019-10-05 ENCOUNTER — Other Ambulatory Visit: Payer: Self-pay | Admitting: Physical Medicine & Rehabilitation

## 2019-10-07 ENCOUNTER — Encounter: Payer: Medicare HMO | Attending: Physical Medicine & Rehabilitation | Admitting: Physical Medicine & Rehabilitation

## 2019-10-07 ENCOUNTER — Other Ambulatory Visit: Payer: Self-pay

## 2019-10-07 VITALS — BP 116/67 | HR 73 | Temp 97.7°F

## 2019-10-07 DIAGNOSIS — I69920 Aphasia following unspecified cerebrovascular disease: Secondary | ICD-10-CM | POA: Diagnosis not present

## 2019-10-07 DIAGNOSIS — M8909 Algoneurodystrophy, multiple sites: Secondary | ICD-10-CM | POA: Diagnosis not present

## 2019-10-07 DIAGNOSIS — I69351 Hemiplegia and hemiparesis following cerebral infarction affecting right dominant side: Secondary | ICD-10-CM

## 2019-10-07 DIAGNOSIS — F4323 Adjustment disorder with mixed anxiety and depressed mood: Secondary | ICD-10-CM | POA: Diagnosis not present

## 2019-10-07 MED ORDER — METHYLPREDNISOLONE 4 MG PO TBPK: ORAL_TABLET | ORAL | 0 refills | Status: DC

## 2019-10-07 NOTE — Patient Instructions (Signed)
May take stool softener every day  Call if the medrol dosepack doesn't help

## 2019-10-07 NOTE — Progress Notes (Signed)
Subjective:    Patient ID: Luke Rivera, male    DOB: Dec 20, 1940, 78 y.o.   MRN: [id number] 78 year old male with history of CAD and chronic atrial fibrillation as well as type 2 diabetes with peripheral neuropathy who was admitted to [facility] on 05/31/2019 with right-sided weakness and speech problems.  His CT showed left MCA distribution infarct.  TCD negative for PFO.  Eliquis dose was increased to 5 mg twice daily.  He was admitted to rehabilitation on 06/07/2019 and discharged on 06/02/2019. HHPT, HHOT, HHSLP through the [facility]  Patient returns today with his wife he is in a wheelchair but according to his wife uses the walker in the home. No falls reported. Dressing and bathing Mod I level Has a private CNA to cover pt while wife runs errands.   His home health physical therapist has okayed him to ambulate at a modified independent level in the home using a walker. Interval medical history, had neuro follow-up and referral to outpatient PT OT speech  Chief complaint today is shoulder and hand pain right side. Right shoulder injection was not very helpful for pain, lasted about 2 days. Right hand slightly swollen.  No hypersensitivity to light touch Pain Inventory Average Pain 7 Pain Right Now 4 My pain is dull and aching  In the last 24 hours, has pain interfered with the following? General activity 4 Relation with others 4 Enjoyment of life 4 What TIME of day is your pain at its worst? all Sleep (in general) Good  Pain is worse with: some activites Pain improves with: medication and injections Relief from Meds: 4  Mobility walk with assistance use a walker use a wheelchair transfers alone  Function I need assistance with the following:  dressing and meal prep  Neuro/Psych weakness tingling  Prior Studies Any changes since last visit?  no  Physicians involved in your care Neurologist Dr. Leonie Man   Family History  Problem Relation Age of Onset  . Hypertension  Father   . Heart disease Mother   . Heart attack Brother    Social History   Socioeconomic History  . Marital status: Married    Spouse name: Not on file  . Number of children: Not on file  . Years of education: Not on file  . Highest education level: Not on file  Occupational History  . Not on file  Social Needs  . Financial resource strain: Not on file  . Food insecurity    Worry: Not on file    Inability: Not on file  . Transportation needs    Medical: Not on file    Non-medical: Not on file  Tobacco Use  . Smoking status: Former Research scientist (life sciences)  . Smokeless tobacco: Never Used  Substance and Sexual Activity  . Alcohol use: Never    Alcohol/week: 0.0 standard drinks    Frequency: Never  . Drug use: Never  . Sexual activity: Not on file  Lifestyle  . Physical activity    Days per week: Not on file    Minutes per session: Not on file  . Stress: Not on file  Relationships  . Social Herbalist on phone: Not on file    Gets together: Not on file    Attends religious service: Not on file    Active member of club or organization: Not on file    Attends meetings of clubs or organizations: Not on file    Relationship status: Not on file  Other Topics Concern  . Not on file  Social History Narrative  . Not on file   No past surgical history on file. Past Medical History:  Diagnosis Date  . CAD (coronary artery disease)   . CHF (congestive heart failure) ([place])   . Chronic anticoagulation   . DM2 (diabetes mellitus, type 2) ([place])   . DVT (deep vein thrombosis) in pregnancy   . GERD (gastroesophageal reflux disease)   . HTN (hypertension)   . TIA (transient ischemic attack)    BP 116/67   Pulse 73   Temp 97.7 F (36.5 C)   SpO2 94%   Opioid Risk Score:   Fall Risk Score:  `1  Depression screen PHQ 2/9  Depression screen [facility] 2/9 10/07/2019 08/16/2019  Decreased Interest 0 0  Down, Depressed, Hopeless 0 0  PHQ - 2 Score 0 0    Review of Systems   Gastrointestinal: Positive for vomiting.  Neurological: Positive for weakness.       Tingling  All other systems reviewed and are negative.      Objective:   Physical Exam Vitals signs and nursing note reviewed.  Constitutional:      Appearance: Normal appearance.  Eyes:     Extraocular Movements: Extraocular movements intact.     Conjunctiva/sclera: Conjunctivae normal.     Pupils: Pupils are equal, round, and reactive to light.  Musculoskeletal:     Comments: Positive right shoulder impingement at 90 degrees with mild pain. No pain with external rotation of the right shoulder.  No pain with MCP compression test.  There is no tenderness to palpation over the hand or wrist area.  There is pain with passive finger flexion.  He does have extension contractures at the MCP PIP and DIP of the right hand. No evidence of  hyperhidrosis on the right side.  No vasomotor changes no nailbed changes.  Neurological:     Mental Status: He is alert and oriented to person, place, and time.     Cranial Nerves: Dysarthria present.     Coordination: Finger-Nose-Finger Test abnormal. Impaired rapid alternating movements.     Gait: Gait abnormal.     Comments: There is evidence of word finding deficit and disfluency.  Sit to stand is mod I except for needing some help with wheelchair parts management  Psychiatric:        Mood and Affect: Mood normal.        Behavior: Behavior normal.    Motor strength is 3 - at the right shoulder 4 - at the biceps triceps finger flexors and extensors 4/5 in the right hip flexor knee extensor 3 - at the right ankle dorsiflexor Sensation is reported is equal in bilateral upper extremities to light touch.       Assessment & Plan:  #1.  Left MCA distribution infarct with right hemiparesis overall making a good recovery he still has significant aphasia.  I agree with outpatient PT OT speech #2.  Right shoulder hand syndrome RSD variant overall mild symptomatology  would start with a Medrol Dosepak.  Discussed the possibility of blood sugar elevation as long as it is in the 200 range no additional Lantus would be needed if it goes up to 300 would recommend increasing Lantus temporarily to 25 units  Physical medicine rehab follow-up in 6 weeks

## 2019-10-12 ENCOUNTER — Ambulatory Visit: Payer: Medicare HMO | Attending: Adult Health | Admitting: Physical Therapy

## 2019-10-12 ENCOUNTER — Encounter: Payer: Self-pay | Admitting: Physical Therapy

## 2019-10-12 ENCOUNTER — Ambulatory Visit: Payer: Medicare HMO

## 2019-10-12 ENCOUNTER — Other Ambulatory Visit: Payer: Self-pay

## 2019-10-12 DIAGNOSIS — M79641 Pain in right hand: Secondary | ICD-10-CM | POA: Diagnosis present

## 2019-10-12 DIAGNOSIS — R2689 Other abnormalities of gait and mobility: Secondary | ICD-10-CM | POA: Insufficient documentation

## 2019-10-12 DIAGNOSIS — R2681 Unsteadiness on feet: Secondary | ICD-10-CM | POA: Diagnosis present

## 2019-10-12 DIAGNOSIS — R4701 Aphasia: Secondary | ICD-10-CM | POA: Diagnosis present

## 2019-10-12 DIAGNOSIS — I69318 Other symptoms and signs involving cognitive functions following cerebral infarction: Secondary | ICD-10-CM | POA: Insufficient documentation

## 2019-10-12 DIAGNOSIS — I69351 Hemiplegia and hemiparesis following cerebral infarction affecting right dominant side: Secondary | ICD-10-CM | POA: Diagnosis present

## 2019-10-12 DIAGNOSIS — M6281 Muscle weakness (generalized): Secondary | ICD-10-CM | POA: Diagnosis present

## 2019-10-12 DIAGNOSIS — R278 Other lack of coordination: Secondary | ICD-10-CM | POA: Diagnosis present

## 2019-10-12 DIAGNOSIS — M79601 Pain in right arm: Secondary | ICD-10-CM | POA: Insufficient documentation

## 2019-10-12 NOTE — Therapy (Signed)
[facility] [address] [place], [place], [address] Phone: [phone]   Fax:  [phone]  Physical Therapy Evaluation  Patient Details  Name: Luke Rivera MRN: [id number] Date of Birth: 1940/12/16 Referring Provider (PT): Frann Rider, NP   Encounter Date: 10/12/2019  PT End of Session - 10/12/19 1604    Visit Number  1    Number of Visits  24    Date for PT Re-Evaluation  01/04/20    Authorization Type  [employer] MCR    PT Start Time  [id number]    PT Stop Time  1535    PT Time Calculation (min)  50 min    Equipment Utilized During Treatment  Gait belt    Activity Tolerance  Patient tolerated treatment well;Patient limited by fatigue    Behavior During Therapy  [facility] for tasks assessed/performed       Past Medical History:  Diagnosis Date  . CAD (coronary artery disease)   . CHF (congestive heart failure) ([place])   . Chronic anticoagulation   . DM2 (diabetes mellitus, type 2) ([place])   . DVT (deep vein thrombosis) in pregnancy   . GERD (gastroesophageal reflux disease)   . HTN (hypertension)   . TIA (transient ischemic attack)     History reviewed. No pertinent surgical history.  There were no vitals filed for this visit.   Subjective Assessment - 10/12/19 1454    Subjective  78 year old male with history of CAD and chronic atrial fibrillation as well as type 2 diabetes with peripheral neuropathy who was admitted to [facility] on 05/31/2019 with right-sided weakness and speech problems.  His CT showed left MCA distribution infarct. He was admitted to rehabilitation on 06/07/2019 and discharged on 07/02/2019.  He then had HHPT and was discharged last week. He can stand about 10 min. He needs some assistance with bathing, dressing,. He also is having Rt hand RSD and has tried voltaren gel, different textures, hot cold, and now is trying prednisone for this.    How long can you stand comfortably?  10 min    How long can you  walk comfortably?  10 min    Patient Stated Goals  get back to where he used to be    Currently in Pain?  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  1   fell out of bed and scratched arm     [place] residence    Additional Comments  single level      Prior Function   Level of Independence  Independent      Cognition   Overall Cognitive Status  Within Functional  Limits for tasks assessed      Observation/Other Assessments   Observations  expressive aphasia      Sensation   Additional Comments  light touch hypersensitve in Rt UE, decreased in Rt LE      Coordination   Gross Motor Movements are Fluid and Coordinated  No    Coordination and Movement Description  decreased on Rt side    Finger Nose Finger Test  decreased on Rt side      ROM / Strength   AROM / PROM / Strength  AROM;Strength      AROM   Overall AROM Comments  Rt shoulder limited to about 100 deg abd/flexion,       Strength   Overall Strength Comments  Rt UE 4-/5 MMT, Rt hip 4-, Rt knee 4+, Rt ankle 3- all tested in stting      Transfers   Five time sit to stand comments   46 sec using hands to push up from arm rests,needs close superision      Ambulation/Gait   Ambulation/Gait  Yes    Ambulation/Gait Assistance  5: Supervision    Ambulation Distance (Feet)  115 Feet    Assistive device  Rolling walker    Gait Pattern  Decreased step length -  right;Decreased step length - left;Decreased stance time - right;Decreased hip/knee flexion - right;Decreased dorsiflexion - right;Decreased weight shift to right;Wide base of support;Poor foot clearance - right   has AFO for Rt, Rt foot/hip into excessive ER   Ambulation Surface  Level;Indoor    Gait velocity  20 ft in 25 sec=.08 ft/sec      Balance   Balance Assessed  Yes      Standardized Balance Assessment   Standardized Balance Assessment  Timed Up and Go Test;Berg Balance Test      Berg Balance Test   Sit to Stand  Able to stand using hands after several tries    Standing Unsupported  Able to stand 2 minutes with supervision    Sitting with Back Unsupported but Feet Supported on Floor or Stool  Able to sit safely and securely 2 minutes    Stand to Sit  Uses backs of legs against chair to control descent    Transfers  Needs one person to assist    Standing Unsupported with Eyes Closed  Able to stand 10 seconds with supervision    Standing Unsupported with Feet Together  Able to place feet together independently but unable to hold for 30 seconds    From Standing, Reach Forward with Outstretched Arm  Reaches forward but needs supervision    From Standing Position, Turn to Look Behind Over each Shoulder  Needs supervision when turning      Timed Up and Go Test   Normal TUG (seconds)  49   with RW               Objective measurements completed on examination: See above findings.              PT Education - 10/12/19 1604    Education Details  HEP, POC, exam findings    Person(s) Educated  Patient;Spouse    Methods  Explanation;Demonstration;Verbal cues;Handout    Comprehension  Verbalized understanding;Need further instruction       PT Short Term Goals - 10/12/19 1612      PT SHORT TERM GOAL #1   Title  Pt will be I and compliant with HEP. (STG target date 4 weeks  11/11/19)    Status  New      PT SHORT TERM GOAL #2   Title  Pt will improve 5TSTS test to  <40 seconds      PT SHORT TERM GOAL #3   Title  Pt will improve TUG to less than 40 seconds      PT SHORT TERM GOAL #4   Title  Pt will improve gait speed to >1 ft/sec with LRAD        PT Long Term Goals - 10/12/19 1614      PT LONG TERM GOAL #1   Title  Pt will improve TUG to less than 15 seconds LRAD    Time  12    Period  Weeks    Status  New      PT LONG TERM GOAL #2   Title  Pt will improve 5TSTS test to <20 seconds.    Time  12    Period  Weeks    Status  New      PT LONG TERM GOAL #3   Title  Pt will improve gait speed greater than 1.5 ft/sec LRAD    Time  12    Period  Weeks    Status  New      PT LONG TERM GOAL #4   Title  Pt will be able to ambuate at least 750 ft for community ambulaiton mod I with LRAD.    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 CVA occured 05/31/19. He then had inpt PT and HHPT, he is now referred to outpaitent PT. He has overall Rt sided hemiparesis, balance and gait deficits, now requires RW for slower unsteady gait, decreased Rt UE ROM, increased Rt UE pain, and decreased functional abilities. He will benefit from skilled PT to address his deficits.    Personal Factors and Comorbidities  Age;Past/Current Experience    Examination-Activity Limitations  Bathing;Transfers;Locomotion Level;Reach Overhead;Bed Mobility;Bend;Self Feeding;Carry;Squat;Dressing;Stairs;Hygiene/Grooming;Stand;Lift;Toileting    Examination-Participation Restrictions  Meal Prep;Cleaning;Community  Activity;Driving;Shop;Laundry;Yard Work    [employer]chant [employer] officer  Evolving/Moderate complexity    Clinical Decision Making  Moderate    Rehab Potential  Good    PT Frequency  2x / week   2-3   PT Duration  12 weeks    PT Treatment/Interventions  ADLs/Self Care Home Management;Aquatic Therapy;Cryotherapy;English as a second language teacher;Therapeutic activities;Therapeutic exercise;Balance training;Neuromuscular re-education;Manual techniques;Orthotic Fit/Training;Patient/family education;Passive range of motion;Dry needling;Energy conservation;Splinting;Joint Manipulations;Taping    PT Next Visit Plan  finish with BERG balance test, needs gait, balance, Rt sided strengthening    PT Home Exercise Plan  Access Code: [email or web address]    Consulted and Agree with Plan of Care  Patient       Patient will benefit from skilled therapeutic intervention in order to improve the following deficits and impairments:  Abnormal gait, Decreased activity tolerance, Decreased balance, Decreased coordination, Decreased endurance, Decreased mobility,  Decreased range of motion, Decreased strength, Difficulty walking, Hypomobility, Increased fascial restricitons, Increased muscle spasms, Impaired flexibility, Postural dysfunction, Pain  Visit Diagnosis: Hemiplegia and hemiparesis following cerebral infarction affecting right dominant side (HCC)  Other abnormalities of gait and mobility  Pain in right arm     Problem List Patient Active Problem List   Diagnosis Date Noted  . Dysphagia, post-stroke   . Diabetes mellitus type 2 in obese ([place])   . Acute blood loss anemia   . Hypokalemia   . Diabetic peripheral neuropathy ([place])   . Right knee pain   . Acute ischemic right MCA stroke ([place]) 06/07/2019  . AMS (altered mental status)   . Incidental pulmonary nodule, > 24mm and < 32mm 06/01/2019  . CVA (cerebral vascular accident) ([place]) 05/31/2019  . Fibroma of foot  02/20/2015  . 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 Onychomycosis 01/11/2015  . Pain in lower limb 01/11/2015  . Hammer toe of right foot 01/02/2015  . Pain in right foot 01/02/2015    Silvestre Mesi 10/12/2019, 4:25 PM  [facility] [address] [place], [place], [address] Phone: [phone]   Fax:  [phone]  Name: Luke Rivera MRN: [id number] Date of Birth: 1941/05/12

## 2019-10-12 NOTE — Addendum Note (Signed)
Addended by: Debbe Odea on: 10/12/2019 04:27 PM   Modules accepted: Orders

## 2019-10-12 NOTE — Patient Instructions (Signed)
Access Code: [email or web address]  URL: [email or web address]  Date: 10/12/2019  Prepared by: Elsie Ra   Exercises  Side Stepping with Counter Support - 3-5 reps - 1 sets - 2x daily - 6x weekly  Standing Tandem Balance with Counter Support - 3 reps - 1 sets - 30 hold - 2x daily - 6x weekly  Sit to Stand with Armchair - 5 reps - 1-2 sets - 2x daily - 6x weekly  Standing March with Counter Support - 10 reps - 1-2 sets - 2x daily - 6x weekly  Standing Alternating Knee Flexion - 10 reps - 1-2 sets - 2x daily - 6x weekly

## 2019-10-13 ENCOUNTER — Ambulatory Visit: Payer: Medicare HMO | Admitting: Adult Health

## 2019-10-13 NOTE — Therapy (Signed)
[facility] [address] [place], [place], [address] Phone: [phone]   Fax:  [phone]  Speech Language Pathology Evaluation  Patient Details  Name: Luke Rivera MRN: [id number] Date of Birth: [birth year] Referring Provider (SLP): Frann Rider, NP   Encounter Date: 10/12/2019  End of Session - 10/13/19 1103    Visit Number  1    Number of Visits  17    Date for SLP Re-Evaluation  01/10/20   90 days   SLP Start Time  [id number]   PT went over time   SLP Stop Time   1620    SLP Time Calculation (min)  43 min    Activity Tolerance  Patient tolerated treatment well       Past Medical History:  Diagnosis Date  . CAD (coronary artery disease)   . CHF (congestive heart failure) ([place])   . Chronic anticoagulation   . DM2 (diabetes mellitus, type 2) ([place])   . DVT (deep vein thrombosis) in pregnancy   . GERD (gastroesophageal reflux disease)   . HTN (hypertension)   . TIA (transient ischemic attack)     History reviewed. No pertinent surgical history.  There were no vitals filed for this visit.      SLP Evaluation [facility] - 10/13/19 0001      SLP Visit Information   SLP Received On  10/12/19    Referring Provider (SLP)  Frann Rider, NP    Onset Date  05-31-19    Medical Diagnosis  Lt MCA CVA      Subjective   Patient/Family Stated Goal  Improve language ability      Pain Assessment   Currently in Pain?  Yes    Pain Score  5     Pain Location  Head    Pain Orientation  Right      General Information   HPI  Pt 78 y.o. male with lt MCA CVA on 05-31-19. He had skilled ST on CIR until d/c 07-02-19. HHST following until recently. Arrives today complaining of difficulty "get my words straight, and order."      Balance Screen   Has the patient fallen in the past 6 months  --   PT eval just completed     Prior Functional Status   Cognitive/Linguistic Baseline  Within functional limits    Type of [place]  --   retired VP from [employer]   Overall Cognitive Status  Within [place] for tasks assessed      Auditory Comprehension   Overall [place] within functional limits for tasks assessed      Verbal Expression   Overall Verbal Expression  Impaired    Level of Generative/Spontaneous Verbalization  Sentence    Repetition  No impairment    Naming  --   very limited impairment    Effective Techniques  Phonemic cues;Semantic cues    Other Verbal Expression Comments  Pt's syntax (verb/tense choice, etc) is more impaired than pt's naming ability, per se. Pt occasionally reverted to telegraphic speech in conversation/picture descripiton. In reading the [place], pt's speech was mildly hesitant and pt had rare paraphasias with functor words (and, but, a) and also rarely omitted functor words.       Oral Motor/Sensory Function   Overall Oral Motor/Sensory Function  Appears within functional limits for tasks assessed      Motor Speech   Overall Motor Speech  Appears within functional limits for tasks assessed                      SLP Education - 10/13/19 1101    Education Details  evaluation results, pt using telegraphic speech - encouragement to use full sentences, possible goals    Person(s) Educated  Patient    Methods  Explanation;Demonstration    Comprehension  Verbalized understanding;Need further instruction       SLP Short Term Goals - 10/13/19 1254      SLP SHORT TERM GOAL #1   Title  pt to produce 18/20 functional sentence responses in structured therapy tasks over three sessions    Time  4    Period  Weeks    Status  New      SLP SHORT TERM GOAL #2   Title  pt will participate in assessment of reading comprehension and written expression    Time  2    Period  Weeks    Status  New      SLP SHORT TERM GOAL #3   Title  pt will  participate in 8 minutes simple conversation over three sessions, functionally/with modified independence (compensations for anomia)    Time  4    Period  Weeks    Status  New       SLP Long Term Goals - 10/13/19 1257      SLP LONG TERM GOAL #1   Title  pt will participate in 10 minutes simple to mod complex conversation with modified independence, functionally, in 3 sessions    Time  8    Period  Weeks      SLP LONG TERM GOAL #2   Title  pt will use functional verbal expression in 8 minutes mod complex conversation with rare min A for anomia over three sessions    Time  8    Period  Weeks    Status  New       Plan - 10/13/19 1250    Clinical Impression Statement  Pt presents today with (as tested) 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 Pt had difficulty today with longer verbal requirements such as picture description and min-mod complex conversation. Reading and writing have yet to be assessed but pt would benefit from these assessments. Goals for these areas will be added PRN. Pt would benefit from skilled ST targeting verbal expression (at least), with reading comprehension and written communication yet to be assessed.    Speech Therapy Frequency  2x / week    Duration  --   8 weeks, or 17 visits   Treatment/Interventions  Functional tasks;Oral motor exercises;Multimodal communcation approach;SLP instruction and feedback;Compensatory strategies;Patient/family education;Internal/external aids;Cueing hierarchy;Language facilitation    Potential to Achieve Goals  Good    Consulted and Agree with Plan of Care  Patient       Patient will benefit from skilled therapeutic intervention in order to improve the following deficits and impairments:   Aphasia    Problem List Patient Active Problem List   Diagnosis Date Noted  . Dysphagia,  post-stroke   . Diabetes mellitus type 2 in obese ([place])   . Acute blood loss anemia   . Hypokalemia   . Diabetic peripheral neuropathy ([place])   .  Right knee pain   . Acute ischemic right MCA stroke ([place]) 06/07/2019  . AMS (altered mental status)   . Incidental pulmonary nodule, > 5mm and < 64mm 06/01/2019  . CVA (cerebral vascular accident) ([place]) 05/31/2019  . 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 Onychomycosis 01/11/2015  . Pain in lower limb 01/11/2015  . Hammer toe of right foot 01/02/2015  . Pain in right foot 01/02/2015    [facility] ,MS, CCC-SLP  10/13/2019, 1:01 PM  [facility] [address] [place], [place], [address] Phone: [phone]   Fax:  [phone]  Name: Luke Rivera MRN: [id number] Date of Birth: Nov 18, 1941

## 2019-10-13 NOTE — Patient Instructions (Signed)
Try and use full sentences as much as you can

## 2019-10-19 ENCOUNTER — Other Ambulatory Visit: Payer: Self-pay

## 2019-10-19 ENCOUNTER — Ambulatory Visit: Payer: Medicare HMO | Admitting: Physical Therapy

## 2019-10-19 ENCOUNTER — Ambulatory Visit: Payer: Medicare HMO

## 2019-10-19 DIAGNOSIS — R4701 Aphasia: Secondary | ICD-10-CM

## 2019-10-19 DIAGNOSIS — I69351 Hemiplegia and hemiparesis following cerebral infarction affecting right dominant side: Secondary | ICD-10-CM

## 2019-10-19 DIAGNOSIS — R2689 Other abnormalities of gait and mobility: Secondary | ICD-10-CM

## 2019-10-19 DIAGNOSIS — M79601 Pain in right arm: Secondary | ICD-10-CM

## 2019-10-19 NOTE — Therapy (Signed)
[facility] [address] [place], [place], [address] Phone: [phone]   Fax:  [phone]  Physical Therapy Treatment  Patient Details  Name: Luke Rivera MRN: [id number] Date of Birth: [birth year] Referring Provider (PT): Frann Rider, NP   Encounter Date: 10/19/2019  PT End of Session - 10/19/19 1511    Visit Number  2    Number of Visits  24    Date for PT Re-Evaluation  01/04/20    Authorization Type  [employer] MCR    PT Start Time  1150    PT Stop Time  [id number]    PT Time Calculation (min)  45 min    Equipment Utilized During Treatment  Gait belt    Activity Tolerance  Patient tolerated treatment well;Patient limited by fatigue    Behavior During Therapy  [facility] for tasks assessed/performed       Past Medical History:  Diagnosis Date  . CAD (coronary artery disease)   . CHF (congestive heart failure) ([place])   . Chronic anticoagulation   . DM2 (diabetes mellitus, type 2) ([place])   . DVT (deep vein thrombosis) in pregnancy   . GERD (gastroesophageal reflux disease)   . HTN (hypertension)   . TIA (transient ischemic attack)     No past surgical history on file.  There were no vitals filed for this visit.  Subjective Assessment - 10/19/19 1149    Subjective  relays he has been doing HEP has no questions, decided to close his eyes with one of the balance exercises to make it harder for him. Denies pain today    How long can you stand comfortably?  10 min    How long can you walk comfortably?  10 min    Patient Stated Goals  get back to where he used to be    Currently in Pain?  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, Right shoulder hand syndrome RSD     Referring Provider (PT)  Frann Rider, NP      Berg Balance Test   Sit to Stand  Able to stand using hands after several tries    Standing  Unsupported  Able to stand 2 minutes with supervision    Sitting with Back Unsupported but Feet Supported on Floor or Stool  Able to sit safely and securely 2 minutes    Stand to Sit  Uses backs of legs against chair to control descent    Transfers  Needs one person to assist    Standing Unsupported with Eyes Closed  Able to stand 10 seconds with supervision    Standing Unsupported with Feet Together  Able to place feet together independently but unable to hold for 30 seconds    From Standing, Reach Forward with Outstretched Arm  Reaches forward but needs supervision    From Standing Position, Pick up Object from Floor  Able to pick up shoe, needs supervision    From Standing Position, Turn to Look Behind Over each Shoulder  Needs supervision when turning    Turn 360 Degrees  Needs assistance while turning    Standing Unsupported, Alternately Place Feet on Step/Stool  Needs assistance to keep from falling or unable to try    Standing Unsupported, One Foot in [employer] balance while stepping or standing    Standing on  One Leg  Unable to try or needs assist to prevent fall    Total Score  22                   [facility] Adult PT Treatment/Exercise - 10/19/19 0001      Ambulation/Gait   Ambulation/Gait  Yes    Ambulation/Gait Assistance  5: Supervision    Ambulation Distance (Feet)  115 Feet   X2   Assistive device  Rolling walker    Gait Pattern  Decreased step length - right;Decreased step length - left;Decreased stance time - right;Decreased hip/knee flexion - right;Decreased dorsiflexion - right;Decreased weight shift to right;Wide base of support;Poor foot clearance - right      Therapeutic Activites    Therapeutic Activities  Other Therapeutic Activities    Other Therapeutic Activities  sit to stand X 8 reps spaced out during session with supervision, needs UE support to push up from. Performed stairs up/down X2, one set leading with his right leg (increaesd hyperextension  with this) and one set leading with his Lt leg (difficulty clearing Rt leg at times      Neuro Re-ed    Neuro Re-ed Details   step taps for balance X10 bilat needs min to mod A with this, balance in bars walking fwd no UE support and retro walk with one UE support, tandem balance      Exercises   Exercises  Other Exercises    Other Exercises   Nu step L5 for 5 min UE/LE for endurance and reciprocal pattern, Step ups onto 4 inch step with bilat UE support X 10 on Rt side, standing hip abd and marching X 15 bilat               PT Short Term Goals - 10/12/19 1612      PT SHORT TERM GOAL #1   Title  Pt will be I and compliant with HEP. (STG target date 4 weeks 11/11/19)    Status  New      PT SHORT TERM GOAL #2   Title  Pt will improve 5TSTS test to <40 seconds      PT SHORT TERM GOAL #3   Title  Pt will improve TUG to less than 40 seconds      PT SHORT TERM GOAL #4   Title  Pt will improve gait speed to >1 ft/sec with LRAD        PT Long Term Goals - 10/12/19 1614      PT LONG TERM GOAL #1   Title  Pt will improve TUG to less than 15 seconds LRAD    Time  12    Period  Weeks    Status  New      PT LONG TERM GOAL #2   Title  Pt will improve 5TSTS test to <20 seconds.    Time  12    Period  Weeks    Status  New      PT LONG TERM GOAL #3   Title  Pt will improve gait speed greater than 1.5 ft/sec LRAD    Time  12    Period  Weeks    Status  New      PT LONG TERM GOAL #4   Title  Pt will be able to ambuate at least 750 ft for community ambulaiton mod I with LRAD.    Time  12    Period  Weeks  PT LONG TERM GOAL #5   Title  Pt will be able to negotiate at least one flight of stairs with only one handrail mod I in order to visit with friends/family or have better access to community    Time  12    Period  Weeks    Status  New            Plan - 10/19/19 1512    Clinical Impression Statement  Session focused on balance, Rt leg strength, and  gait/stairs today. His BERG balance score places him at a high falls risk. He needs frequent rest breaks due to fatigue. He will continue to benefit from skilled PT.    Personal Factors and Comorbidities  Age;Past/Current Experience    Examination-Activity Limitations  Bathing;Transfers;Locomotion Level;Reach Overhead;Bed Mobility;Bend;Self Feeding;Carry;Squat;Dressing;Stairs;Hygiene/Grooming;Stand;Lift;Toileting    Examination-Participation Restrictions  Meal Prep;Cleaning;Community Activity;Driving;Shop;Laundry;Yard Work    [employer]  Evolving/Moderate complexity    Rehab Potential  Good    PT Frequency  2x / week   2-3   PT Duration  12 weeks    PT Treatment/Interventions  ADLs/Self Care Home Management;Aquatic Therapy;Cryotherapy;English as a second language teacher;Therapeutic activities;Therapeutic exercise;Balance training;Neuromuscular re-education;Manual techniques;Orthotic Fit/Training;Patient/family education;Passive range of motion;Dry needling;Energy conservation;Splinting;Joint Manipulations;Taping    PT Next Visit Plan  needs gait, balance, Rt sided strengthening    PT Home Exercise Plan  Access Code: [email or web address]    Consulted and Agree with Plan of Care  Patient       Patient will benefit from skilled therapeutic intervention in order to improve the following deficits and impairments:  Abnormal gait, Decreased activity tolerance, Decreased balance, Decreased coordination, Decreased endurance, Decreased mobility, Decreased range of motion, Decreased strength, Difficulty walking, Hypomobility, Increased fascial restricitons, Increased muscle spasms, Impaired flexibility, Postural dysfunction, Pain  Visit Diagnosis: Hemiplegia and hemiparesis following cerebral infarction affecting right dominant side (HCC)  Other abnormalities of gait and mobility  Pain in right arm     Problem List Patient Active Problem List    Diagnosis Date Noted  . Dysphagia, post-stroke   . Diabetes mellitus type 2 in obese ([place])   . Acute blood loss anemia   . Hypokalemia   . Diabetic peripheral neuropathy ([place])   . Right knee pain   . Acute ischemic right MCA stroke ([place]) 06/07/2019  . AMS (altered mental status)   . Incidental pulmonary nodule, > 42mm and < 17mm 06/01/2019  . CVA (cerebral vascular accident) ([place]) 05/31/2019  . 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 Onychomycosis 01/11/2015  . Pain in lower limb 01/11/2015  . Hammer toe of right foot 01/02/2015  . Pain in right foot 01/02/2015    Silvestre Mesi 10/19/2019, 3:46 PM  [facility] [address] [place], [place], [address] Phone: [phone]   Fax:  [phone]  Name: Luke Rivera MRN: [id number] Date of Birth: Jul 16, 1941

## 2019-10-19 NOTE — Therapy (Signed)
[facility] [address] [place], [place], [address] Phone: [phone]   Fax:  [phone]  Speech Language Pathology Treatment  Patient Details  Name: Luke Rivera MRN: [id number] Date of Birth: 31-Jul-1941 Referring Provider (SLP): Frann Rider, NP   Encounter Date: 10/19/2019  End of Session - 10/19/19 1258    Visit Number  2    Number of Visits  17    Date for SLP Re-Evaluation  01/10/20    SLP Start Time  1104    SLP Stop Time   1145    SLP Time Calculation (min)  41 min    Activity Tolerance  Patient tolerated treatment well       Past Medical History:  Diagnosis Date  . CAD (coronary artery disease)   . CHF (congestive heart failure) ([place])   . Chronic anticoagulation   . DM2 (diabetes mellitus, type 2) ([place])   . DVT (deep vein thrombosis) in pregnancy   . GERD (gastroesophageal reflux disease)   . HTN (hypertension)   . TIA (transient ischemic attack)     No past surgical history on file.  There were no vitals filed for this visit.  Subjective Assessment - 10/19/19 1114    Subjective  "I don't read." Pt reads emails and paper.    Patient is accompained by:  Family member   wife           ADULT SLP TREATMENT - 10/19/19 1120      General Information   Behavior/Cognition  Alert;Cooperative;Agitated   some mild, minmal verbal agitation     Treatment Provided   Treatment provided  Cognitive-Linquistic      Cognitive-Linquistic Treatment   Treatment focused on  Aphasia    Skilled Treatment  min-mod compelx conversation re: deficit areas compeletd with extra time, functionally. Pt reports a frustration level of 4 re: ability to communicate verbally (1=not bothersome, 10= most frustrating thing dealing with right now). "Converastion was the bulk of what it was - last 3-4 weeks (re:ST)." Pt used written support with pt for verbalizing details about his daughters - SLP encouraged pt to expand  his original utterances using the written intormation SLP wrote down for support. Pt functional by end of session in describing more details about daughters when he slows his rate. SLP told pt he would greatly benefit from slowing rate to give his brain extra time to process his verbal output. Wife agreed and provided examples from home to verify this is the case.        Assessment / Recommendations / Plan   Plan  Continue with current plan of care      Progression Toward Goals   Progression toward goals  Progressing toward goals       SLP Education - 10/19/19 1258    Education Details  slow rate for more fluid verbal output, pt could use written support to pre-plan verbal encounters with friends and family    Person(s) Educated  Patient;Spouse    Methods  Explanation;Demonstration    Comprehension  Verbalized understanding       SLP Short Term Goals - 10/19/19 1301      SLP SHORT TERM GOAL #1   Title  pt to produce 18/20 functional sentence responses in structured therapy tasks over three sessions    Time  4    Period  Weeks    Status  On-going      SLP SHORT TERM GOAL #2   Title  pt will participate in assessment of reading comprehension and written expression    Time  2    Period  Weeks    Status  On-going      SLP SHORT TERM GOAL #3   Title  pt will participate in 8 minutes simple conversation over three sessions, functionally/with modified independence (compensations for anomia)    Time  4    Period  Weeks    Status  On-going       SLP Long Term Goals - 10/19/19 1301      SLP LONG TERM GOAL #1   Title  pt will participate in 10 minutes simple to mod complex conversation with modified independence, functionally, in 3 sessions    Time  8    Period  Weeks    Status  On-going      SLP LONG TERM GOAL #2   Title  pt will use functional verbal expression in 8 minutes mod complex conversation with rare min A for anomia over three sessions    Time  8    Period  Weeks     Status  On-going       Plan - 10/19/19 1258    Clinical Impression Statement  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 SLP suggested pt and wife oculd use this compensation for pt's verbal expression in some situations to pre-plan. Writing is illegible (OT Eval thursday 10-21-19), but no spelling errors noted in pt's homework. REading: pt may benefit from a formal reading assessment.  Pt would benefit from cont'd skilled ST targeting verbal expression (at least), with reading comprehension and written communication yet to be assessed.    Speech Therapy Frequency  2x / week    Duration  --   8 weeks, or 17 visits   Treatment/Interventions  Functional tasks;Oral motor exercises;Multimodal communcation approach;SLP instruction and feedback;Compensatory strategies;Patient/family education;Internal/external aids;Cueing hierarchy;Language facilitation    Potential to Achieve Goals  Good    Consulted and Agree with Plan of Care  Patient       Patient will benefit from skilled therapeutic intervention in order to improve the following deficits and impairments:   Aphasia    Problem List Patient Active Problem List   Diagnosis Date Noted  . Dysphagia, post-stroke   . Diabetes mellitus type 2 in obese ([place])   . Acute blood loss anemia   . Hypokalemia   . Diabetic peripheral neuropathy ([place])   . Right knee pain   . Acute ischemic right MCA stroke ([place]) 06/07/2019  . AMS (altered mental status)   . Incidental pulmonary nodule, > 98mm and < 51mm 06/01/2019  . CVA (cerebral vascular accident) ([place]) 05/31/2019  . 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 Onychomycosis 01/11/2015  . Pain in lower limb 01/11/2015  . Hammer toe of right foot 01/02/2015  . Pain in right foot 01/02/2015     [facility] ,[place], [place]  10/19/2019, 1:02 PM  [facility] [address] [place], [place], [address] Phone: [phone]   Fax:  [phone]   Name: Luke Rivera MRN: [id number] Date of Birth: [birth year]

## 2019-10-21 ENCOUNTER — Ambulatory Visit: Payer: Medicare HMO | Admitting: Occupational Therapy

## 2019-10-21 ENCOUNTER — Ambulatory Visit: Payer: Medicare HMO | Admitting: Speech Pathology

## 2019-10-21 ENCOUNTER — Ambulatory Visit: Payer: Medicare HMO | Admitting: Physical Therapy

## 2019-10-21 ENCOUNTER — Other Ambulatory Visit: Payer: Self-pay

## 2019-10-21 DIAGNOSIS — I69351 Hemiplegia and hemiparesis following cerebral infarction affecting right dominant side: Secondary | ICD-10-CM

## 2019-10-21 DIAGNOSIS — R2681 Unsteadiness on feet: Secondary | ICD-10-CM

## 2019-10-21 DIAGNOSIS — M79601 Pain in right arm: Secondary | ICD-10-CM

## 2019-10-21 DIAGNOSIS — R2689 Other abnormalities of gait and mobility: Secondary | ICD-10-CM

## 2019-10-21 DIAGNOSIS — M6281 Muscle weakness (generalized): Secondary | ICD-10-CM

## 2019-10-21 DIAGNOSIS — R278 Other lack of coordination: Secondary | ICD-10-CM

## 2019-10-21 DIAGNOSIS — M79641 Pain in right hand: Secondary | ICD-10-CM

## 2019-10-21 DIAGNOSIS — R4701 Aphasia: Secondary | ICD-10-CM

## 2019-10-21 DIAGNOSIS — I69318 Other symptoms and signs involving cognitive functions following cerebral infarction: Secondary | ICD-10-CM

## 2019-10-21 NOTE — Therapy (Signed)
[facility] [address] [place], [place], [address] Phone: [phone]   Fax:  [phone]  Speech Language Pathology Treatment  Patient Details  Name: Luke Rivera MRN: [id number] Date of Birth: 07/20/1941 Referring Provider (SLP): Frann Rider, NP   Encounter Date: 10/21/2019  End of Session - 10/21/19 1550    Visit Number  3    Number of Visits  17    Date for SLP Re-Evaluation  01/10/20    SLP Start Time  1450    SLP Stop Time   1535    SLP Time Calculation (min)  45 min    Activity Tolerance  Patient tolerated treatment well       Past Medical History:  Diagnosis Date  . CAD (coronary artery disease)   . CHF (congestive heart failure) ([place])   . Chronic anticoagulation   . DM2 (diabetes mellitus, type 2) ([place])   . DVT (deep vein thrombosis) in pregnancy   . GERD (gastroesophageal reflux disease)   . HTN (hypertension)   . TIA (transient ischemic attack)     No past surgical history on file.  There were no vitals filed for this visit.  Subjective Assessment - 10/21/19 1459    Currently in Pain?  Yes    Pain Score  4     Pain Location  Arm    Pain Orientation  Right    Pain Type  Chronic pain            ADULT SLP TREATMENT - 10/21/19 1500      General Information   Behavior/Cognition  Alert;Cooperative      Treatment Provided   Treatment provided  Cognitive-Linquistic      Cognitive-Linquistic Treatment   Treatment focused on  Aphasia    Skilled Treatment  SLP worked with pt today on slowing rate in conversation (min-mod complex) re: familiar topics (golf, basketball, coaching). Patient required min A rarely for slow rate after initial instruction. Pt with anomic instances x4 during session, 2 of which he overcame with additional time, 1 of which SLP provided mod cues for description, and additional time when wife supplied the word. SLP used this last instance as an example to cue pt for  description and he was able to provide category, physical description, 2 associations and location with min questioning cues. Wife reports she does cue pt for description at home. SLP encouraged pt to try to use instances of anomia as an opportunity to exercise his brain and self-cuing strategies.      Assessment / Recommendations / Plan   Plan  Continue with current plan of care      Progression Toward Goals   Progression toward goals  Progressing toward goals       SLP Education - 10/21/19 1548    Education Details  compensations for anomia, slow rate to improve fluidity, how to cue pt for wordfinding difficulties    Person(s) Educated  Patient;Spouse    Methods  Explanation    Comprehension  Verbalized understanding       SLP Short Term Goals - 10/21/19 1551      SLP SHORT TERM GOAL #1   Title  pt to produce 18/20 functional sentence responses in structured therapy tasks over three sessions    Time  3    Period  Weeks    Status  On-going      SLP SHORT TERM GOAL #2   Title  pt will participate in assessment  of reading comprehension and written expression    Time  1    Period  Weeks    Status  On-going      SLP SHORT TERM GOAL #3   Title  pt will participate in 8 minutes simple conversation over three sessions, functionally/with modified independence (compensations for anomia)    Time  3    Period  Weeks    Status  On-going       SLP Long Term Goals - 10/21/19 1551      SLP LONG TERM GOAL #1   Title  pt will participate in 10 minutes simple to mod complex conversation with modified independence, functionally, in 3 sessions    Time  7    Period  Weeks    Status  On-going      SLP LONG TERM GOAL #2   Title  pt will use functional verbal expression in 8 minutes mod complex conversation with rare min A for anomia over three sessions    Time  7    Period  Weeks    Status  On-going       Plan - 10/21/19 1551    Clinical Impression Statement  Pt presents today with  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 SLP suggested pt and wife oculd use this compensation for pt's verbal expression in some situations to pre-plan. Writing is illegible (OT Eval thursday 10-21-19), but no spelling errors noted in pt's homework. REading: pt may benefit from a formal reading assessment.  Pt would benefit from cont'd skilled ST targeting verbal expression (at least), with reading comprehension and written communication yet to be assessed.    Speech Therapy Frequency  2x / week    Duration  --   8 weeks, or 17 visits   Treatment/Interventions  Functional tasks;Oral motor exercises;Multimodal communcation approach;SLP instruction and feedback;Compensatory strategies;Patient/family education;Internal/external aids;Cueing hierarchy;Language facilitation    Potential to Achieve Goals  Good    Consulted and Agree with Plan of Care  Patient       Patient will benefit from skilled therapeutic intervention in order to improve the following deficits and impairments:   Aphasia    Problem List Patient Active Problem List   Diagnosis Date Noted  . Dysphagia, post-stroke   . Diabetes mellitus type 2 in obese ([place])   . Acute blood loss anemia   . Hypokalemia   . Diabetic peripheral neuropathy ([place])   . Right knee pain   . Acute ischemic right MCA stroke ([place]) 06/07/2019  . AMS (altered mental status)   . Incidental pulmonary nodule, > 52mm and < 4mm 06/01/2019  . CVA (cerebral vascular accident) ([place]) 05/31/2019  . 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 Onychomycosis 01/11/2015  . Pain in lower limb 01/11/2015  . Hammer toe of right foot 01/02/2015  . Pain in right foot 01/02/2015   Deneise Lever, [place], [place] 10/21/2019, 3:52 PM  [facility] [address] [place], [place], [address] Phone: [phone]   Fax:  [phone]   Name: Luke Rivera MRN: [id number] Date of Birth: 11/17/1941

## 2019-10-21 NOTE — Therapy (Signed)
[facility] [address] [place], [place], [address] Phone: [phone]   Fax:  [phone]  Occupational Therapy Evaluation  Patient Details  Name: Luke Rivera MRN: [id number] Date of Birth: 1941/09/27 Referring Provider (OT): Frann Rider   Encounter Date: 10/21/2019  OT End of Session - 10/21/19 2035    Visit Number  1    Number of Visits  17    Date for OT Re-Evaluation  12/21/19    Authorization Type  [employer] MCR    Authorization - Visit Number  1    Authorization - Number of Visits  10    OT Start Time  [id number]    OT Stop Time  1620    OT Time Calculation (min)  45 min    Activity Tolerance  Patient tolerated treatment well    Behavior During Therapy  [facility] for tasks assessed/performed       Past Medical History:  Diagnosis Date  . CAD (coronary artery disease)   . CHF (congestive heart failure) ([place])   . Chronic anticoagulation   . DM2 (diabetes mellitus, type 2) ([place])   . DVT (deep vein thrombosis) in pregnancy   . GERD (gastroesophageal reflux disease)   . HTN (hypertension)   . TIA (transient ischemic attack)     No past surgical history on file.  There were no vitals filed for this visit.  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 PMH: CAD, Chronic A-fib, DM2, CHF, HTN, peripheral neuropathy    Limitations  no driving    Currently in Pain?  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 eval     Home  Environment   Astronomer;Door   shower bench, grab bars   Additional Comments  Pt lives in 1 story townhome w/ 1 step to enter. Walk in shower. Pt has aide come in 3 days/week to help w/ ADLS    Lives With  Spouse      Prior Function   Level of Independence  Independent   walking stick for community mobility   Vocation  Retired    Leisure  none reported      ADL   Eating/Feeding  Modified independent   using Lt non dominant hand   Grooming  Modified independent   Lt  non dominant hand   Upper Body Bathing  Modified independent   except drying off back    Lower Body Bathing  Modified independent   except drying off legs   Upper Body Dressing  Independent    Lower Body Dressing  Moderate assistance   assist w/ shoes/socks   Toilet Transfer  Modified independent    Toileting - Clothing Manipulation  Independent    Toileting -  Hygiene  Independent    Tub/Shower Transfer  --   Distant sup   ADL comments  grab bars near toilet, in shower and near shower      IADL   Shopping  Completely unable to shop   mostly due to covid   Light Housekeeping  Does not participate in any housekeeping tasks   did not perform prior to stroke   Meal Prep  Able to complete simple warm meal prep;Able to complete simple cold meal and snack prep    Community Mobility  Relies on family or friends for transportation    Medication Management  Is not capable of dispensing or managing own medication   for organization   Financial Management  --   pt does electronically w/ reminders     Mobility   Mobility Status Comments  uses RW      Written Expression   Dominant Hand   Right    Handwriting  50% legible;Severe micrographia   name in cursive, 80-90% in print     Vision - History   Baseline Vision  Wears glasses all the time    Visual History  --   ? glaucoma     Vision Assessment   Comment  Reports blurred vision on rare occasion since stroke      Cognition   Cognition Comments  difficult to assess - pt demo expressive aphasia      Observation/Other Assessments   Observations  expressive aphasia, Rt hand feels warmer and redder in color than Lt unaffected side      Sensation   Additional Comments  light touch hypersensitve in Rt UE, decreased in Rt LE      Coordination   9 Hole Peg Test  Right;Left    Right 9 Hole Peg Test  76.59 sec    Left 9 Hole Peg Test  33.22 sec      Edema   Edema  none      AROM   Overall AROM Comments  LUE WFL's. RUE shoulder flex and abd approx 100*, ER 75%, IR 60% w/ pain, supination 75%, composite finger flexion 75%      Hand Function   Right Hand Grip (lbs)  14 lbs    Left Hand Grip (lbs)  65 lbs                        OT Short Term Goals - 10/21/19 2118      OT SHORT TERM GOAL #1   Title  Independent with HEP for RUE    Time  4    Period  Weeks    Status  New      OT SHORT TERM GOAL #2   Title  Pt to report pain less than or equal to 3/10 RUE for functional ADLS    Time  4    Period  Weeks    Status  New      OT SHORT TERM GOAL #3   Title  Pt to write full sentence  with 90% or greater legibility    Time  4    Period  Weeks    Status  New      OT SHORT TERM GOAL #4   Title  Pt to use Rt dominant hand for eating 50% of the time and grooming 25% of the time    Time  4    Period  Weeks    Status  New      OT SHORT TERM GOAL #5   Title  Pt to improve coordination Rt hand as evidenced by reducing speed on 9 hole peg test to 65 sec. or under    Baseline  76.59 sec    Time  4    Period  Weeks    Status  New      Additional Short Term Goals   Additional Short Term Goals   Yes      OT SHORT TERM GOAL #6   Title  Pt to don/doff shoes and socks mod I level with A/E PRN    Time  4    Period  Weeks    Status  New      OT SHORT TERM GOAL #7   Title  Pt to improve Rt shoulder flex to 110* in prep for high level reaching    Time  4    Period  Weeks    Status  New        OT Long Term Goals - 10/21/19 2124      OT LONG TERM GOAL #1   Title  Independent with updated HEP    Time  8    Period  Weeks    Status  New      OT LONG TERM GOAL #2   Title  Pt to use Rt dominant hand for eating 75% of the time or more, and for grooming 50% of the time or more    Time  8    Period  Weeks    Status  New      OT LONG TERM GOAL #3   Title  Pt to improve grip strength Rt hand to 30 lbs or greater    Baseline  14 lbs    Time  8    Period  Weeks    Status  New      OT LONG TERM GOAL #4   Title  Pt to return to light cooking with supervision prn    Time  8    Period  Weeks    Status  New      OT LONG TERM GOAL #5   Title  Pt to improve coordination Rt hand as evidenced by performing 9 hole peg test in 55 sec. or under    Baseline  76.59 sec    Time  8    Period  Weeks    Status  New      Long Term Additional Goals   Additional Long Term Goals  Yes      OT LONG TERM GOAL #6   Title  Pt to perform high level reaching RUE to retrieve/replace light weight objects on high shelf 5/5 trials    Time  8    Period  Weeks    Status  New            Plan - 10/21/19 2043    Clinical Impression Statement  Pt is a 78 y.o. male who presents to outpatient rehab s/p Lt MCA CVA 05/31/19  with Rt dominant side hemiparesis and Rt shoulder hand syndrome RSD. Pt demo decreased ROM, coordination, strength, and increased pain RUE. Pt would benefit from O.T. to address these deficits and increase use of dominant RUE for ADLS    OT Occupational Profile and History  Detailed Assessment- Review of Records and additional review of physical, cognitive, psychosocial history  related to current functional performance    Occupational performance deficits (Please refer to evaluation for details):  ADL's;IADL's    Body Structure / Function / Physical Skills  ADL;Strength;Pain;Tone;Proprioception;UE functional use;IADL;ROM;Sensation;Coordination;Flexibility;Mobility;FMC    Rehab Potential  Good    Clinical Decision Making  Several treatment options, min-mod task modification necessary    Comorbidities Affecting Occupational Performance:  May have comorbidities impacting occupational performance    Modification or Assistance to Complete Evaluation   Min-Moderate modification of tasks or assist with assess necessary to complete eval    OT Frequency  2x / week    OT Duration  8 weeks   Plus eval   OT Treatment/Interventions  Self-care/ADL training;Moist Heat;Fluidtherapy;DME and/or AE instruction;Splinting;Aquatic Therapy;Therapeutic activities;Ultrasound;Therapeutic exercise;Cognitive remediation/compensation;Coping strategies training;Neuromuscular education;Functional Mobility Training;Passive range of motion;Visual/perceptual remediation/compensation;Electrical Stimulation;Paraffin;Manual Therapy;Patient/family education    Plan  initiate HEP for RUE    Consulted and Agree with Plan of Care  Patient       Patient will benefit from skilled therapeutic intervention in order to improve the following deficits and impairments:   Body Structure / Function / Physical Skills: ADL, Strength, Pain, Tone, Proprioception, UE functional use, IADL, ROM, Sensation, Coordination, Flexibility, Mobility, FMC       Visit Diagnosis: Hemiplegia and hemiparesis following cerebral infarction affecting right dominant side (HCC)  Pain in right arm  Other lack of coordination  Muscle weakness (generalized)  Pain in right hand  Other symptoms and signs involving cognitive functions following cerebral infarction    Problem List Patient Active Problem List   Diagnosis Date Noted   . Dysphagia, post-stroke   . Diabetes mellitus type 2 in obese ([place])   . Acute blood loss anemia   . Hypokalemia   . Diabetic peripheral neuropathy ([place])   . Right knee pain   . Acute ischemic right MCA stroke ([place]) 06/07/2019  . AMS (altered mental status)   . Incidental pulmonary nodule, > 42mm and < 72mm 06/01/2019  . CVA (cerebral vascular accident) ([place]) 05/31/2019  . 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 Onychomycosis 01/11/2015  . Pain in lower limb 01/11/2015  . Hammer toe of right foot 01/02/2015  . Pain in right foot 01/02/2015    Carey Bullocks, OTR/L 10/21/2019, 9:28 PM  [facility] [address] [place], [place], [address] Phone: [phone]   Fax:  [phone]  Name: Luke Rivera MRN: [id number] Date of Birth: 08-05-1941

## 2019-10-21 NOTE — Therapy (Signed)
[facility] [address] [place], [place], [address] Phone: [phone]   Fax:  [phone]  Physical Therapy Treatment  Patient Details  Name: Luke Rivera MRN: [id number] Date of Birth: 1941/09/14 Referring Provider (PT): Frann Rider, NP   Encounter Date: 10/21/2019  PT End of Session - 10/21/19 1512    Visit Number  3    Number of Visits  24    Date for PT Re-Evaluation  01/04/20    Authorization Type  [employer]etna MCR    PT Start Time  1315    PT Stop Time  1400    PT Time Calculation (min)  45 min    Equipment Utilized During Treatment  Gait belt    Activity Tolerance  Patient tolerated treatment well;Patient limited by fatigue    Behavior During Therapy  [facility] for tasks assessed/performed       Past Medical History:  Diagnosis Date  . CAD (coronary artery disease)   . CHF (congestive heart failure) ([place])   . Chronic anticoagulation   . DM2 (diabetes mellitus, type 2) ([place])   . DVT (deep vein thrombosis) in pregnancy   . GERD (gastroesophageal reflux disease)   . HTN (hypertension)   . TIA (transient ischemic attack)     No past surgical history on file.  There were no vitals filed for this visit.  Subjective Assessment - 10/21/19 1506    Subjective  no new complaints today, says he is ready to work hard.    How long can you stand comfortably?  10 min    How long can you walk comfortably?  10 min    Patient Stated Goals  get back to where he used to be    Currently in Pain?  Yes    Pain Score  4     Pain Location  Arm    Pain Orientation  Right    Pain Type  Chronic pain           [facility] Adult PT Treatment/Exercise - 10/21/19 0001      Ambulation/Gait   Ambulation/Gait  Yes    Ambulation/Gait Assistance  5: Supervision    Ambulation Distance (Feet)  115 Feet   X2   Assistive device  Rolling walker    Gait Pattern  Decreased step length - right;Decreased step length - left;Decreased stance  time - right;Decreased hip/knee flexion - right;Decreased dorsiflexion - right;Decreased weight shift to right;Wide base of support;Poor foot clearance - right      Neuro Re-ed    Neuro Re-ed Details   In bars for walk fwd (on foam) and retro and lateral walking intermitte UE support PRN.  Standing on airex feet apart progressed to feet together, balance with feet together with head turns and feet apart eyes closed      Exercises   Other Exercises   Sci fit bike L5 for 5 min UE/LE for endurance and reciprocal pattern, supine bridges 2X10 with blue band around knees to control for Rt knee moving too far into ER, sidelying clams X 15, standing hip abd X15 bilat             PT Education - 10/21/19 1511    Education Details  updated HEP for more hip abd strenght    Person(s) Educated  Patient    Methods  Explanation;Demonstration    Comprehension  Verbalized understanding       PT Short Term Goals - 10/12/19 1612  PT SHORT TERM GOAL #1   Title  Pt will be I and compliant with HEP. (STG target date 4 weeks 11/11/19)    Status  New      PT SHORT TERM GOAL #2   Title  Pt will improve 5TSTS test to <40 seconds      PT SHORT TERM GOAL #3   Title  Pt will improve TUG to less than 40 seconds      PT SHORT TERM GOAL #4   Title  Pt will improve gait speed to >1 ft/sec with LRAD        PT Long Term Goals - 10/12/19 1614      PT LONG TERM GOAL #1   Title  Pt will improve TUG to less than 15 seconds LRAD    Time  12    Period  Weeks    Status  New      PT LONG TERM GOAL #2   Title  Pt will improve 5TSTS test to <20 seconds.    Time  12    Period  Weeks    Status  New      PT LONG TERM GOAL #3   Title  Pt will improve gait speed greater than 1.5 ft/sec LRAD    Time  12    Period  Weeks    Status  New      PT LONG TERM GOAL #4   Title  Pt will be able to ambuate at least 750 ft for community ambulaiton mod I with LRAD.    Time  12    Period  Weeks      PT LONG TERM  GOAL #5   Title  Pt will be able to negotiate at least one flight of stairs with only one handrail mod I in order to visit with friends/family or have better access to community    Time  12    Period  Weeks    Status  New            Plan - 10/21/19 1512    Clinical Impression Statement  Due to trandelenburg without UE support, PT updated his HEP to add in more hip abd strengthening. He was taken through these today but will likely need reinforcement. PT will continut to progress as able    Personal Factors and Comorbidities  Age;Past/Current Experience    Examination-Activity Limitations  Bathing;Transfers;Locomotion Level;Reach Overhead;Bed Mobility;Bend;Self Feeding;Carry;Squat;Dressing;Stairs;Hygiene/Grooming;Stand;Lift;Toileting    Examination-Participation Restrictions  Meal Prep;Cleaning;Community Activity;Driving;Shop;Laundry;Yard Work    [employer]  Evolving/Moderate complexity    Rehab Potential  Good    PT Frequency  2x / week   2-3   PT Duration  12 weeks    PT Treatment/Interventions  ADLs/Self Care Home Management;Aquatic Therapy;Cryotherapy;English as a second language teacher;Therapeutic activities;Therapeutic exercise;Balance training;Neuromuscular re-education;Manual techniques;Orthotic Fit/Training;Patient/family education;Passive range of motion;Dry needling;Energy conservation;Splinting;Joint Manipulations;Taping    PT Next Visit Plan  needs gait, balance, Rt sided strengthening    PT Home Exercise Plan  Access Code: [email or web address], added stand hip abd, SL clams, bridge    Consulted and Agree with Plan of Care  Patient       Patient will benefit from skilled therapeutic intervention in order to improve the following deficits and impairments:  Abnormal gait, Decreased activity tolerance, Decreased balance, Decreased coordination, Decreased endurance, Decreased mobility, Decreased range of motion, Decreased  strength, Difficulty walking, Hypomobility, Increased fascial restricitons, Increased muscle spasms, Impaired flexibility, Postural dysfunction, Pain  Visit Diagnosis: Other abnormalities of  gait and mobility  Hemiplegia and hemiparesis following cerebral infarction affecting right dominant side (HCC)  Pain in right arm     Problem List Patient Active Problem List   Diagnosis Date Noted  . Dysphagia, post-stroke   . Diabetes mellitus type 2 in obese ([place])   . Acute blood loss anemia   . Hypokalemia   . Diabetic peripheral neuropathy ([place])   . Right knee pain   . Acute ischemic right MCA stroke ([place]) 06/07/2019  . AMS (altered mental status)   . Incidental pulmonary nodule, > 43mm and < 40mm 06/01/2019  . CVA (cerebral vascular accident) ([place]) 05/31/2019  . 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 Onychomycosis 01/11/2015  . Pain in lower limb 01/11/2015  . Hammer toe of right foot 01/02/2015  . Pain in right foot 01/02/2015    Silvestre Mesi 10/21/2019, 3:28 PM  [facility] [address] [place], [place], [address] Phone: [phone]   Fax:  [phone]  Name: Beto Fougerousse MRN: [id number] Date of Birth: Apr 15, 1941

## 2019-10-26 ENCOUNTER — Ambulatory Visit: Payer: Medicare HMO | Admitting: Physical Therapy

## 2019-10-26 ENCOUNTER — Ambulatory Visit: Payer: Medicare HMO | Admitting: Occupational Therapy

## 2019-10-26 ENCOUNTER — Other Ambulatory Visit: Payer: Self-pay

## 2019-10-26 ENCOUNTER — Ambulatory Visit: Payer: Medicare HMO | Admitting: Speech Pathology

## 2019-10-26 ENCOUNTER — Encounter: Payer: Self-pay | Admitting: Occupational Therapy

## 2019-10-26 DIAGNOSIS — M79641 Pain in right hand: Secondary | ICD-10-CM

## 2019-10-26 DIAGNOSIS — R2689 Other abnormalities of gait and mobility: Secondary | ICD-10-CM

## 2019-10-26 DIAGNOSIS — M79601 Pain in right arm: Secondary | ICD-10-CM

## 2019-10-26 DIAGNOSIS — I69351 Hemiplegia and hemiparesis following cerebral infarction affecting right dominant side: Secondary | ICD-10-CM

## 2019-10-26 DIAGNOSIS — M6281 Muscle weakness (generalized): Secondary | ICD-10-CM

## 2019-10-26 DIAGNOSIS — R278 Other lack of coordination: Secondary | ICD-10-CM

## 2019-10-26 DIAGNOSIS — R4701 Aphasia: Secondary | ICD-10-CM

## 2019-10-26 NOTE — Therapy (Signed)
[facility] [address] [place], [place], [address] Phone: [phone]   Fax:  [phone]  Occupational Therapy Treatment  Patient Details  Name: Luke Rivera MRN: [id number] Date of Birth: 07-30-1941 Referring Provider (OT): Frann Rider   Encounter Date: 10/26/2019  OT End of Session - 10/26/19 1641    Visit Number  2    Number of Visits  17    Date for OT Re-Evaluation  12/21/19    Authorization Type  [employer] MCR    Authorization - Visit Number  2    Authorization - Number of Visits  10    OT Start Time  [id number]    OT Stop Time  1615    OT Time Calculation (min)  42 min    Activity Tolerance  Patient tolerated treatment well    Behavior During Therapy  [facility] for tasks assessed/performed       Past Medical History:  Diagnosis Date  . CAD (coronary artery disease)   . CHF (congestive heart failure) ([place])   . Chronic anticoagulation   . DM2 (diabetes mellitus, type 2) ([place])   . DVT (deep vein thrombosis) in pregnancy   . GERD (gastroesophageal reflux disease)   . HTN (hypertension)   . TIA (transient ischemic attack)     History reviewed. No pertinent surgical history.  There were no vitals filed for this visit.  Subjective Assessment - 10/26/19 1547    Subjective   I cannot emphasize enough the shoulder and hand enough    Currently in Pain?  No/denies    Pain Score  0-No pain                   OT Treatments/Exercises (OP) - 10/26/19 0001      ADLs   Overall ADLs  Patient with significant shoulder pain.  Educated patient and wife on positioning Right elbow above shoulder height while lying on his back in bed.  Patient indicated he could feel relief when we demonstrated in clinic.      ADL Comments  Reviewed short and long term goals with patient and wife [name].  Both are in agreement.  Patient very eager to work on right arm function.        Neurological Re-education Exercises   Other  Exercises 1  Began gentle scapular mobilization.  Patient with relief of pain with scapular depression, however, reported mild discomfort with pro/retraction.  Patient with recent fall from bed - wife concerned that maybe he actually broke something in this fall  - patient did not demonstrate discomfort with passive range of motion of GH joint, but did have pain with attempts at active motion.  Patient with excessive muscle activation around shoulder joint needs reeducation and active assisted motion to help guide a more natural motor recruitment.      Other Exercises 2  Patient with limited lower trunk initiated movement and minimal thoracic extension/flexion.  With emphasis on pelvis movement, patient able to begin to activeley depress shoulder girdel appropriately             OT Education - 10/26/19 1641    Education Details  bed positioning supine    Person(s) Educated  Patient;Spouse    Methods  Explanation;Demonstration    Comprehension  Verbalized understanding       OT Short Term Goals - 10/26/19 1644      OT SHORT TERM GOAL #1   Title  Independent with HEP for RUE  Status  On-going      OT SHORT TERM GOAL #2   Title  Pt to report pain less than or equal to 3/10 RUE for functional ADLS    Status  On-going      OT SHORT TERM GOAL #3   Title  Pt to write full sentence with 90% or greater legibility    Status  On-going      OT SHORT TERM GOAL #4   Title  Pt to use Rt dominant hand for eating 50% of the time and grooming 25% of the time    Status  On-going      OT SHORT TERM GOAL #5   Title  Pt to improve coordination Rt hand as evidenced by reducing speed on 9 hole peg test to 65 sec. or under    Status  On-going      OT SHORT TERM GOAL #6   Title  Pt to don/doff shoes and socks mod I level with A/E PRN    Status  On-going      OT SHORT TERM GOAL #7   Title  Pt to improve Rt shoulder flex to 110* in prep for high level reaching    Status  On-going        OT  Long Term Goals - 10/21/19 2124      OT LONG TERM GOAL #1   Title  Independent with updated HEP    Time  8    Period  Weeks    Status  New      OT LONG TERM GOAL #2   Title  Pt to use Rt dominant hand for eating 75% of the time or more, and for grooming 50% of the time or more    Time  8    Period  Weeks    Status  New      OT LONG TERM GOAL #3   Title  Pt to improve grip strength Rt hand to 30 lbs or greater    Baseline  14 lbs    Time  8    Period  Weeks    Status  New      OT LONG TERM GOAL #4   Title  Pt to return to light cooking with supervision prn    Time  8    Period  Weeks    Status  New      OT LONG TERM GOAL #5   Title  Pt to improve coordination Rt hand as evidenced by performing 9 hole peg test in 55 sec. or under    Baseline  76.59 sec    Time  8    Period  Weeks    Status  New      Long Term Additional Goals   Additional Long Term Goals  Yes      OT LONG TERM GOAL #6   Title  Pt to perform high level reaching RUE to retrieve/replace light weight objects on high shelf 5/5 trials    Time  8    Period  Weeks    Status  New            Plan - 10/26/19 1641    Clinical Impression Statement  Patient and wife are in agreement with OT goals and plan of care.  Patient eager for imporved use of RUE - explained that decreasing pain and improving range of motion in right shoulder would be first priority.    OT Frequency  2x / week    OT Duration  8 weeks    OT Treatment/Interventions  Self-care/ADL training;Moist Heat;Fluidtherapy;DME and/or AE instruction;Splinting;Aquatic Therapy;Therapeutic activities;Ultrasound;Therapeutic exercise;Cognitive remediation/compensation;Neuromuscular education;Functional Mobility Training;Passive range of motion;Visual/perceptual remediation/compensation;Electrical Stimulation;Paraffin;Manual Therapy;Patient/family education    Plan  NMR RUE - Gentle stretching HEP    Consulted and Agree with Plan of Care  Patient;Family  member/caregiver       Patient will benefit from skilled therapeutic intervention in order to improve the following deficits and impairments:           Visit Diagnosis: Hemiplegia and hemiparesis following cerebral infarction affecting right dominant side (HCC)  Pain in right arm  Other lack of coordination  Muscle weakness (generalized)  Pain in right hand    Problem List Patient Active Problem List   Diagnosis Date Noted  . Dysphagia, post-stroke   . Diabetes mellitus type 2 in obese ([place])   . Acute blood loss anemia   . Hypokalemia   . Diabetic peripheral neuropathy ([place])   . Right knee pain   . Acute ischemic right MCA stroke ([place]) 06/07/2019  . AMS (altered mental status)   . Incidental pulmonary nodule, > 40mm and < 64mm 06/01/2019  . CVA (cerebral vascular accident) ([place]) 05/31/2019  . 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 Onychomycosis 01/11/2015  . Pain in lower limb 01/11/2015  . Hammer toe of right foot 01/02/2015  . Pain in right foot 01/02/2015    Mariah Milling, OTR/L 10/26/2019, 4:46 PM  [facility] [address] [place], [place], [address] Phone: [phone]   Fax:  [phone]  Name: Taegan Bodiford MRN: [id number] Date of Birth: 11-07-1941

## 2019-10-26 NOTE — Therapy (Signed)
[facility] [address] [place], [place], [address] Phone: [phone]   Fax:  [phone]  Physical Therapy Treatment  Patient Details  Name: Luke Rivera MRN: [id number] Date of Birth: 1941-06-26 Referring Provider (PT): Frann Rider, NP   Encounter Date: 10/26/2019  PT End of Session - 10/26/19 2002    Visit Number  4    Number of Visits  24    Date for PT Re-Evaluation  01/04/20    Authorization Type  Aetna MCR    PT Start Time  [id number]    PT Stop Time  1655    PT Time Calculation (min)  40 min    Equipment Utilized During Treatment  Gait belt    Activity Tolerance  Patient tolerated treatment well;Patient limited by fatigue    Behavior During Therapy  [facility] for tasks assessed/performed       Past Medical History:  Diagnosis Date  . CAD (coronary artery disease)   . CHF (congestive heart failure) ([place])   . Chronic anticoagulation   . DM2 (diabetes mellitus, type 2) ([place])   . DVT (deep vein thrombosis) in pregnancy   . GERD (gastroesophageal reflux disease)   . HTN (hypertension)   . TIA (transient ischemic attack)     No past surgical history on file.  There were no vitals filed for this visit.  Subjective Assessment - 10/26/19 1950    Subjective  no new complaints today, denies pain except for his shoulder    How long can you stand comfortably?  10 min    How long can you walk comfortably?  10 min    Patient Stated Goals  get back to where he used to be    Pain Onset  More than a month ago          [facility] Adult PT Treatment/Exercise - 10/26/19 0001      Ambulation/Gait   Ambulation/Gait  Yes    Ambulation/Gait Assistance  5: Supervision    Ambulation Distance (Feet)  115 Feet   x2   Assistive device  Rolling walker    Gait Pattern  Decreased step length - right;Decreased step length - left;Decreased stance time - right;Decreased hip/knee flexion - right;Decreased dorsiflexion -  right;Decreased weight shift to right;Wide base of support;Poor foot clearance - right      Neuro Re-ed    Neuro Re-ed Details   balance with feet together with head turns, balance feet apart eyes closed, step taps X 10 bilat      Exercises   Other Exercises   Sci fit bike L5 for 5 min UE/LE for endurance, sidestepping in bars with bilat UE support with red band around his knees up/down X 3 reps for hip abd strength, standing hip abd 2X10 bilat, up down stairs X 1 stepping up with Lt leg first, then step ups with Rt leg X 5 reps bilat UE support.        PT Short Term Goals - 10/12/19 1612      PT SHORT TERM GOAL #1   Title  Pt will be I and compliant with HEP. (STG target date 4 weeks 11/11/19)    Status  New      PT SHORT TERM GOAL #2   Title  Pt will improve 5TSTS test to <40 seconds      PT SHORT TERM GOAL #3   Title  Pt will improve TUG to less than 40 seconds  PT SHORT TERM GOAL #4   Title  Pt will improve gait speed to >1 ft/sec with LRAD        PT Long Term Goals - 10/12/19 1614      PT LONG TERM GOAL #1   Title  Pt will improve TUG to less than 15 seconds LRAD    Time  12    Period  Weeks    Status  New      PT LONG TERM GOAL #2   Title  Pt will improve 5TSTS test to <20 seconds.    Time  12    Period  Weeks    Status  New      PT LONG TERM GOAL #3   Title  Pt will improve gait speed greater than 1.5 ft/sec LRAD    Time  12    Period  Weeks    Status  New      PT LONG TERM GOAL #4   Title  Pt will be able to ambuate at least 750 ft for community ambulaiton mod I with LRAD.    Time  12    Period  Weeks      PT LONG TERM GOAL #5   Title  Pt will be able to negotiate at least one flight of stairs with only one handrail mod I in order to visit with friends/family or have better access to community    Time  [place] - 10/26/19 2003    Clinical Impression Statement  Session focused on gait, endurance,  Rt sided strength. Continued to focus on hip abd strength on Right side and added sidestepping with band around his knees for increased glute medius activation. He continues to have severe deficits in these areas and will continue to benefit from PT.    Personal Factors and Comorbidities  Age;Past/Current Experience    Examination-Activity Limitations  Bathing;Transfers;Locomotion Level;Reach Overhead;Bed Mobility;Bend;Self Feeding;Carry;Squat;Dressing;Stairs;Hygiene/Grooming;Stand;Lift;Toileting    Examination-Participation Restrictions  Meal Prep;Cleaning;Community Activity;Driving;Shop;Laundry;Yard Work    [employer]  Evolving/Moderate complexity    Rehab Potential  Good    PT Frequency  2x / week   2-3   PT Duration  12 weeks    PT Treatment/Interventions  ADLs/Self Care Home Management;Aquatic Therapy;Cryotherapy;English as a second language teacher;Therapeutic activities;Therapeutic exercise;Balance training;Neuromuscular re-education;Manual techniques;Orthotic Fit/Training;Patient/family education;Passive range of motion;Dry needling;Energy conservation;Splinting;Joint Manipulations;Taping    PT Next Visit Plan  needs gait, balance, Rt sided strengthening    PT Home Exercise Plan  Access Code: [email or web address], added stand hip abd, SL clams, bridge    Consulted and Agree with Plan of Care  Patient       Patient will benefit from skilled therapeutic intervention in order to improve the following deficits and impairments:  Abnormal gait, Decreased activity tolerance, Decreased balance, Decreased coordination, Decreased endurance, Decreased mobility, Decreased range of motion, Decreased strength, Difficulty walking, Hypomobility, Increased fascial restricitons, Increased muscle spasms, Impaired flexibility, Postural dysfunction, Pain  Visit Diagnosis: Hemiplegia and hemiparesis following cerebral infarction affecting right dominant side  (HCC)  Pain in right arm  Muscle weakness (generalized)  Other abnormalities of gait and mobility     Problem List Patient Active Problem List   Diagnosis Date Noted  . Dysphagia, post-stroke   . Diabetes mellitus type 2 in obese ([place])   . Acute blood loss anemia   . Hypokalemia   .  Diabetic peripheral neuropathy ([place])   . Right knee pain   . Acute ischemic right MCA stroke ([place]) 06/07/2019  . AMS (altered mental status)   . Incidental pulmonary nodule, > 92mm and < 64mm 06/01/2019  . CVA (cerebral vascular accident) ([place]) 05/31/2019  . 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 Onychomycosis 01/11/2015  . Pain in lower limb 01/11/2015  . Hammer toe of right foot 01/02/2015  . Pain in right foot 01/02/2015    Silvestre Mesi 10/26/2019, 8:10 PM  [facility] [address] [place], [place], [address] Phone: [phone]   Fax:  [phone]  Name: Bashawn Kraushaar MRN: [id number] Date of Birth: [birth year]

## 2019-10-27 NOTE — Therapy (Signed)
[facility] [address] [place], [place], [address] Phone: [phone]   Fax:  [phone]  Speech Language Pathology Treatment  Patient Details  Name: Luke Rivera MRN: [id number] Date of Birth: 06-11-1941 Referring Provider (SLP): Frann Rider, NP   Encounter Date: 10/26/2019  End of Session - 10/27/19 1059    Visit Number  3    Number of Visits  17    Date for SLP Re-Evaluation  01/10/20    SLP Start Time  1450    SLP Stop Time   1534    SLP Time Calculation (min)  44 min    Activity Tolerance  Patient tolerated treatment well       Past Medical History:  Diagnosis Date  . CAD (coronary artery disease)   . CHF (congestive heart failure) ([place])   . Chronic anticoagulation   . DM2 (diabetes mellitus, type 2) ([place])   . DVT (deep vein thrombosis) in pregnancy   . GERD (gastroesophageal reflux disease)   . HTN (hypertension)   . TIA (transient ischemic attack)     No past surgical history on file.  There were no vitals filed for this visit.  Subjective Assessment - 10/27/19 1007    Subjective  Pt arrives with wife    Patient is accompained by:  Family member   wife   Currently in Pain?  No/denies            ADULT SLP TREATMENT - 10/27/19 1009      General Information   Behavior/Cognition  Alert;Cooperative      Treatment Provided   Treatment provided  Cognitive-Linquistic      Cognitive-Linquistic Treatment   Treatment focused on  Aphasia    Skilled Treatment  SLP conducted pt/caregiver interview re: reading and writing. Pt reports he did not read much prior to his CVA, however he did read and reply to emails from family and friends quite often. SLP assessed pt's reading and writing via portions of WAB-R part 2 and supplemental assessment. Reading comprehension 60/60; writing 44/50 (omitted functor, incorrect plural). Pt required extended time for writing and much of writing was illegible, due  to physical vs linguistic difficulty. Pt typed appropriate 4 sentence response to email from therapist, again with extended time due to physical difficulties. Pt reports reluctance to email friends is primarily from ability to use keyboard; in further discussion with pt and wife opted not to include reading/writing goals at this time but to focus on verbal expression.       Assessment / Recommendations / Plan   Plan  Continue with current plan of care      Progression Toward Goals   Progression toward goals  Progressing toward goals         SLP Short Term Goals - 10/27/19 1058      SLP SHORT TERM GOAL #1   Title  pt to produce 18/20 functional sentence responses in structured therapy tasks over three sessions    Time  2    Period  Weeks    Status  On-going      SLP SHORT TERM GOAL #2   Title  pt will participate in assessment of reading comprehension and written expression    Time  1    Period  Weeks    Status  Achieved      SLP SHORT TERM GOAL #3   Title  pt will participate in 8 minutes simple conversation over three sessions, functionally/with modified  independence (compensations for anomia)    Time  2    Period  Weeks    Status  On-going       SLP Long Term Goals - 10/27/19 1058      SLP LONG TERM GOAL #1   Title  pt will participate in 10 minutes simple to mod complex conversation with modified independence, functionally, in 3 sessions    Time  6    Period  Weeks    Status  On-going      SLP LONG TERM GOAL #2   Title  pt will use functional verbal expression in 8 minutes mod complex conversation with rare min A for anomia over three sessions    Time  6    Period  Weeks    Status  On-going       Plan - 10/27/19 1053    Clinical Impression Statement  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 Assessed reading and writing today with WAB-R; reading score 60/60, writing 44/50; primary difficulty with writing appears to be physical vs linguistic and pt/wife prefer to focus on verbal communication with ST at this time.  Pt would benefit from cont'd skilled ST targeting verbal expression.    Speech Therapy Frequency  2x / week    Duration  --   8 weeks, or 17 visits   Treatment/Interventions  Functional tasks;Oral motor exercises;Multimodal communcation approach;SLP instruction and feedback;Compensatory strategies;Patient/family education;Internal/external aids;Cueing hierarchy;Language facilitation    Potential to Achieve Goals  Good    Consulted and Agree with Plan of Care  Patient       Patient will benefit from skilled therapeutic intervention in order to improve the following deficits and impairments:   Aphasia    Problem List Patient Active Problem List   Diagnosis Date Noted  . Dysphagia, post-stroke   . Diabetes mellitus type 2 in obese ([place])   . Acute blood loss anemia   . Hypokalemia   . Diabetic peripheral neuropathy ([place])   . Right knee pain   . Acute ischemic right MCA stroke ([place]) 06/07/2019  . AMS (altered mental status)   . Incidental pulmonary nodule, > 35mm and < 43mm 06/01/2019  . CVA (cerebral vascular accident) ([place]) 05/31/2019  . 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 Onychomycosis 01/11/2015  . Pain in lower limb 01/11/2015  . Hammer toe of right foot 01/02/2015  . Pain in right foot 01/02/2015   Deneise Lever, [place], CCC-SLP Speech-Language Pathologist  Aliene Altes 10/27/2019, 11:00 AM  [facility] [address] [place], [place], [address] Phone: [phone]   Fax:  [phone]   Name: Luke Rivera MRN: [id number] Date of Birth: 06-Nov-1941

## 2019-10-28 ENCOUNTER — Ambulatory Visit: Payer: Medicare HMO | Admitting: Speech Pathology

## 2019-10-28 ENCOUNTER — Encounter: Payer: Self-pay | Admitting: Physical Therapy

## 2019-10-28 ENCOUNTER — Ambulatory Visit: Payer: Medicare HMO | Admitting: Physical Therapy

## 2019-10-28 ENCOUNTER — Ambulatory Visit: Payer: Medicare HMO | Admitting: Occupational Therapy

## 2019-10-28 ENCOUNTER — Other Ambulatory Visit: Payer: Self-pay

## 2019-10-28 DIAGNOSIS — I69351 Hemiplegia and hemiparesis following cerebral infarction affecting right dominant side: Secondary | ICD-10-CM

## 2019-10-28 DIAGNOSIS — R2689 Other abnormalities of gait and mobility: Secondary | ICD-10-CM

## 2019-10-28 DIAGNOSIS — R4701 Aphasia: Secondary | ICD-10-CM

## 2019-10-28 DIAGNOSIS — M79601 Pain in right arm: Secondary | ICD-10-CM

## 2019-10-28 DIAGNOSIS — M6281 Muscle weakness (generalized): Secondary | ICD-10-CM

## 2019-10-28 NOTE — Therapy (Signed)
[facility] [address] [place], [place], [address] Phone: [phone]   Fax:  [phone]  Occupational Therapy Treatment  Patient Details  Name: Luke Rivera MRN: [id number] Date of Birth: [birth year] Referring Provider (OT): Frann Rider   Encounter Date: 10/28/2019  OT End of Session - 10/28/19 1208    Visit Number  3    Number of Visits  17    Date for OT Re-Evaluation  12/21/19    Authorization Type  [employer] MCR    Authorization - Visit Number  3    Authorization - Number of Visits  10    OT Start Time  1100    OT Stop Time  1145    OT Time Calculation (min)  45 min    Activity Tolerance  Patient tolerated treatment well    Behavior During Therapy  [facility] for tasks assessed/performed       Past Medical History:  Diagnosis Date  . CAD (coronary artery disease)   . CHF (congestive heart failure) ([place])   . Chronic anticoagulation   . DM2 (diabetes mellitus, type 2) ([place])   . DVT (deep vein thrombosis) in pregnancy   . GERD (gastroesophageal reflux disease)   . HTN (hypertension)   . TIA (transient ischemic attack)     No past surgical history on file.  There were no vitals filed for this visit.  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 PMH: CAD, Chronic A-fib, DM2, CHF, HTN, peripheral neuropathy    Limitations  no driving    Currently in Pain?  Yes    Pain Score  4     Pain Location  Arm    Pain Orientation  Right    Pain Descriptors / Indicators  Aching;Sore    Pain Type  Chronic pain    Pain Onset  More than a month ago    Pain Frequency  Intermittent    Aggravating Factors   certain movements, lifting arm up    Pain Relieving Factors  nothing so far       Seated: wt bearing over Rt elbow while performing contralateral and ipsilateral reaching LUE for Rt scapula depression, tone management, and proprioceptive input. RUE  supported AA/ROM in abduction (along ball) for targeted reaching - pt did better w/ actual target to reach for and decreased compensations.  Supine: worked on self assist ROM in flexion w/ mod cueing for correct positioning and min facilitation at scapula. Pt had no pain with this. Progressed to BUE shoulder flexion w/ palms facing holding thick foam noodle for chest press to sh flex overhead, back to chest press motion w/ min assist to maintain neutral rotation and prevent sh abd and IR. [name] shown how to help facilitate this at home and verbalized understanding - pt had no pain when done w/ min facilitation.  Seated at table: pt shown AA/ROM in BUE sh flexion (through table slides) palms facing/touching and Rt shoulder scaption movement in prep for functional side reaching. Pt had no pain with these.                     OT Education - 10/28/19 1207    Education Details  initial HEP    Person(s) Educated  Patient;Spouse    Methods  Explanation;Demonstration;Verbal cues   Will issue handout next session   Comprehension  Verbalized understanding;Returned demonstration;Verbal cues required;Need further  instruction       OT Short Term Goals - 10/26/19 1644      OT SHORT TERM GOAL #1   Title  Independent with HEP for RUE    Status  On-going      OT SHORT TERM GOAL #2   Title  Pt to report pain less than or equal to 3/10 RUE for functional ADLS    Status  On-going      OT SHORT TERM GOAL #3   Title  Pt to write full sentence with 90% or greater legibility    Status  On-going      OT SHORT TERM GOAL #4   Title  Pt to use Rt dominant hand for eating 50% of the time and grooming 25% of the time    Status  On-going      OT SHORT TERM GOAL #5   Title  Pt to improve coordination Rt hand as evidenced by reducing speed on 9 hole peg test to 65 sec. or under    Status  On-going      OT SHORT TERM GOAL #6   Title  Pt to don/doff shoes and socks mod I level with A/E PRN     Status  On-going      OT SHORT TERM GOAL #7   Title  Pt to improve Rt shoulder flex to 110* in prep for high level reaching    Status  On-going        OT Long Term Goals - 10/21/19 2124      OT LONG TERM GOAL #1   Title  Independent with updated HEP    Time  8    Period  Weeks    Status  New      OT LONG TERM GOAL #2   Title  Pt to use Rt dominant hand for eating 75% of the time or more, and for grooming 50% of the time or more    Time  8    Period  Weeks    Status  New      OT LONG TERM GOAL #3   Title  Pt to improve grip strength Rt hand to 30 lbs or greater    Baseline  14 lbs    Time  8    Period  Weeks    Status  New      OT LONG TERM GOAL #4   Title  Pt to return to light cooking with supervision prn    Time  8    Period  Weeks    Status  New      OT LONG TERM GOAL #5   Title  Pt to improve coordination Rt hand as evidenced by performing 9 hole peg test in 55 sec. or under    Baseline  76.59 sec    Time  8    Period  Weeks    Status  New      Long Term Additional Goals   Additional Long Term Goals  Yes      OT LONG TERM GOAL #6   Title  Pt to perform high level reaching RUE to retrieve/replace light weight objects on high shelf 5/5 trials    Time  8    Period  Weeks    Status  New            Plan - 10/28/19 1209    Clinical Impression Statement  Pt progressing with Rt shoulder with less pain  when performing more normal movement pattern/recruitment.    Occupational performance deficits (Please refer to evaluation for details):  ADL's;IADL's    Body Structure / Function / Physical Skills  ADL;Strength;Pain;Tone;Proprioception;UE functional use;IADL;ROM;Sensation;Coordination;Flexibility;Mobility;FMC    Rehab Potential  Good    OT Frequency  2x / week    OT Duration  8 weeks    OT Treatment/Interventions  Self-care/ADL training;Moist Heat;Fluidtherapy;DME and/or AE instruction;Splinting;Aquatic Therapy;Therapeutic activities;Ultrasound;Therapeutic  exercise;Cognitive remediation/compensation;Neuromuscular education;Functional Mobility Training;Passive range of motion;Visual/perceptual remediation/compensation;Electrical Stimulation;Paraffin;Manual Therapy;Patient/family education    Plan  issue handout (already printed) and review, continue NMR RUE/trunk, Rt hand use    Consulted and Agree with Plan of Care  Patient;Family member/caregiver    Family Member Consulted  wife       Patient will benefit from skilled therapeutic intervention in order to improve the following deficits and impairments:   Body Structure / Function / Physical Skills: ADL, Strength, Pain, Tone, Proprioception, UE functional use, IADL, ROM, Sensation, Coordination, Flexibility, Mobility, FMC       Visit Diagnosis: Hemiplegia and hemiparesis following cerebral infarction affecting right dominant side (HCC)  Pain in right arm  Muscle weakness (generalized)    Problem List Patient Active Problem List   Diagnosis Date Noted  . Dysphagia, post-stroke   . Diabetes mellitus type 2 in obese ([place])   . Acute blood loss anemia   . Hypokalemia   . Diabetic peripheral neuropathy ([place])   . Right knee pain   . Acute ischemic right MCA stroke ([place]) 06/07/2019  . AMS (altered mental status)   . Incidental pulmonary nodule, > 50mm and < 63mm 06/01/2019  . CVA (cerebral vascular accident) ([place]) 05/31/2019  . 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 Onychomycosis 01/11/2015  . Pain in lower limb 01/11/2015  . Hammer toe of right foot 01/02/2015  . Pain in right foot 01/02/2015    Carey Bullocks, OTR/L 10/28/2019, 12:11 PM  [facility] [address] [place], [place], [address] Phone: [phone]   Fax:  [phone]  Name: Kentavius Hoefling MRN: [id number] Date of Birth: [birth year]

## 2019-10-28 NOTE — Patient Instructions (Signed)
Flexion (Assistive)    LAYING DOWN - Hold Rt wrist (thumb side up) w/ Lt hand and raise arms above head. Begin with arms bent and raise to ceiling to straighten elbows, then raise overhead keeping elbows straight, then back to ceiling, then bend arms back to chest Repeat _10___ times. Do __2__ sessions per day.   Cranial Flexion: Overhead Arm Extension - Supine (Medicine Diona Foley)    Lie with knees bent, arms beyond head, holding full paper towel roll. Use same movements as above ex. Keep pinky side of hand on roll. Pull ball up to above face. Repeat _10___ times per set.  Do _2___ sessions per day. Gershon Mussel may need a little help to keep arm from coming out to side  Flexion (Passive)    Sitting upright, slide BOTH arms forward along table w/ palms facing/touching each other, bending from the waist until a stretch is felt. Hold _3___ seconds. Repeat __10__ times. Do __2__ sessions per day.

## 2019-10-28 NOTE — Therapy (Signed)
[facility] [address] [place], [place], [address] Phone: [phone]   Fax:  [phone]  Speech Language Pathology Treatment  Patient Details  Name: Luke Rivera MRN: [id number] Date of Birth: March 28, 1941 Referring Provider (SLP): Frann Rider, NP   Encounter Date: 10/28/2019  End of Session - 10/28/19 1522    Visit Number  4    Number of Visits  17    Date for SLP Re-Evaluation  01/10/20    SLP Start Time  1016    SLP Stop Time   1100    SLP Time Calculation (min)  44 min    Activity Tolerance  Patient tolerated treatment well       Past Medical History:  Diagnosis Date  . CAD (coronary artery disease)   . CHF (congestive heart failure) ([place])   . Chronic anticoagulation   . DM2 (diabetes mellitus, type 2) ([place])   . DVT (deep vein thrombosis) in pregnancy   . GERD (gastroesophageal reflux disease)   . HTN (hypertension)   . TIA (transient ischemic attack)     No past surgical history on file.  There were no vitals filed for this visit.  Subjective Assessment - 10/28/19 1313    Subjective  "I get tired from talking."    Patient is accompained by:  Family member            ADULT SLP TREATMENT - 10/28/19 1313      General Information   Behavior/Cognition  Alert;Cooperative      Treatment Provided   Treatment provided  Cognitive-Linquistic      Pain Assessment   Pain Assessment  No/denies pain      Cognitive-Linquistic Treatment   Treatment focused on  Aphasia    Skilled Treatment  Patient generated sentence descriptions of common objects with extended time and occasional min cues. For less common objects or mod complex concepts, pt responses were vague and he required usual mod cues. Progressed to simple conversation, during which pt used slow rate ~80% of the time. SLP had to cue pt for slower rate occasionally. SLP placed written cues on whiteboard (descriptive questions) and told pt to  ask himself these questions if anomia occured; he required occasional min A to do so. Pt talked with slow rate about his work in [employer]. 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      Assessment / Recommendations / Plan   Plan  Continue with current plan of care      Progression Toward Goals   Progression toward goals  Progressing toward goals       SLP Education - 10/28/19 1521    Education Details  compensations for anomia, slow rate for fluidity    Person(s) Educated  Patient;Spouse    Methods  Explanation;Demonstration    Comprehension  Verbalized understanding       SLP Short Term Goals - 10/28/19 1523      SLP SHORT TERM GOAL #1   Title  pt to produce 18/20 functional sentence responses in structured therapy tasks over three sessions    Time  2    Period  Weeks    Status  On-going      SLP SHORT TERM GOAL #2   Title  pt will participate in assessment of reading comprehension and written expression    Time  1  Period  Weeks    Status  Achieved      SLP SHORT TERM GOAL #3   Title  pt will participate in 8 minutes simple conversation over three sessions, functionally/with modified independence (compensations for anomia)    Time  2    Period  Weeks    Status  On-going       SLP Long Term Goals - 10/28/19 1523      SLP LONG TERM GOAL #1   Title  pt will participate in 10 minutes simple to mod complex conversation with modified independence, functionally, in 3 sessions    Time  6    Period  Weeks    Status  On-going      SLP LONG TERM GOAL #2   Title  pt will use functional verbal expression in 8 minutes mod complex conversation with rare min A for anomia over three sessions    Time  6    Period  Weeks    Status  On-going       Plan - 10/28/19 1522    Clinical Impression Statement  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 Assessed reading and writing WAB-R; pt/wife prefer to focus on verbal communication with ST at this time.  Pt would benefit from cont'd skilled ST targeting verbal expression.    Speech Therapy Frequency  2x / week    Duration  --   8 weeks, or 17 visits   Treatment/Interventions  Functional tasks;Oral motor exercises;Multimodal communcation approach;SLP instruction and feedback;Compensatory strategies;Patient/family education;Internal/external aids;Cueing hierarchy;Language facilitation    Potential to Achieve Goals  Good    Consulted and Agree with Plan of Care  Patient       Patient will benefit from skilled therapeutic intervention in order to improve the following deficits and impairments:   Aphasia    Problem List Patient Active Problem List   Diagnosis Date Noted  . Dysphagia, post-stroke   . Diabetes mellitus type 2 in obese ([place])   . Acute blood loss anemia   . Hypokalemia   . Diabetic peripheral neuropathy ([place])   . Right knee pain   . 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 AMS (altered mental status)   . Incidental pulmonary nodule, > 23mm and < 75mm 06/01/2019  . CVA (cerebral vascular accident) ([place]) 05/31/2019  . 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 Onychomycosis 01/11/2015  . Pain in lower limb 01/11/2015  . Hammer toe of right foot 01/02/2015  . Pain in right foot 01/02/2015   Deneise Lever, [place], [place] 10/28/2019, Ester Rink PM  [facility] [address] [place], [place], [address] Phone: [phone]   Fax:  [phone]   Name: Luke Rivera MRN: [id number] Date of Birth: [birth year]

## 2019-10-29 NOTE — Therapy (Signed)
[facility] [address] [place], [place], [address] Phone: [phone]   Fax:  [phone]  Physical Therapy Treatment  Patient Details  Name: Luke Rivera MRN: [id number] Date of Birth: [birth year] Referring Provider (PT): Frann Rider, NP   Encounter Date: 10/28/2019  PT End of Session - 10/28/19 0939    Visit Number  5    Number of Visits  24    Date for PT Re-Evaluation  01/04/20    Authorization Type  Aetna MCR    PT Start Time  0932    PT Stop Time  1013    PT Time Calculation (min)  41 min    Equipment Utilized During Treatment  Gait belt    Activity Tolerance  Patient tolerated treatment well;Patient limited by fatigue    Behavior During Therapy  [facility] for tasks assessed/performed       Past Medical History:  Diagnosis Date  . CAD (coronary artery disease)   . CHF (congestive heart failure) ([place])   . Chronic anticoagulation   . DM2 (diabetes mellitus, type 2) ([place])   . DVT (deep vein thrombosis) in pregnancy   . GERD (gastroesophageal reflux disease)   . HTN (hypertension)   . TIA (transient ischemic attack)     History reviewed. No pertinent surgical history.  There were no vitals filed for this visit.  Subjective Assessment - 10/28/19 0937    Subjective  No new complaints. Continues to report right shoulder/arm pain. OT addressing. No falls.    How long can you stand comfortably?  10 min    Patient Stated Goals  get back to where he used to be    Currently in Pain?  Yes    Pain Score  4     Pain Location  Shoulder    Pain Orientation  Right    Pain Descriptors / Indicators  Sore;Aching    Pain Type  Chronic pain    Pain Onset  More than a month ago    Aggravating Factors   lifting his arm/shoulder up    Pain Relieving Factors  nothing so far          [facility] Adult PT Treatment/Exercise - 10/28/19 0939      Bed Mobility   Bed Mobility  Sit to Supine;Supine to Sit    Supine to Sit   Minimal Assistance - Patient > 75%    Sit to Supine  Contact Guard/Touching assist      Transfers   Transfers  Sit to Stand;Stand to Sit    Sit to Stand  5: Supervision;4: Min guard;With upper extremity assist;From bed;From chair/3-in-1    Stand to Sit  5: Supervision;4: Min guard;With upper extremity assist;To bed;To chair/3-in-1      Ambulation/Gait   Ambulation/Gait  Yes    Ambulation/Gait Assistance  5: Supervision    Ambulation/Gait Assistance Details  cues for more upright posture. pt noted to have heavy reliance of UE's on walker with gait as well.     Ambulation Distance (Feet)  --   around gym with session   Assistive device  Rolling walker    Gait Pattern  Decreased step length - right;Decreased step length - left;Decreased stance time - right;Decreased hip/knee flexion - right;Decreased dorsiflexion - right;Decreased weight shift to right;Wide base of support;Poor foot clearance - right    Ambulation Surface  Level;Indoor      Exercises   Exercises  Other Exercises    Other Exercises  for hip strengthening: hooklying on mat table- bridges with pt squeezing a yoga block for 10 reps. cues for full pelvic lifting and slow, controlled lowering back to mat. then had pt perform bridges with red band around legs with emphasis on keeping the band pulled tight for 10 reps. continued cues for full pelvic lift adn slow, controlled lowering.; seated at edge of mat- sit<>stands with pt squeezing yoga block between knees for 10 reps with UE assist, cues for full standing and slow, controlled desent. then had pt perform sit<>stands with red band aound knees, emphasis on keeping the band pulled tight for 2 sets of 5 reps. cues for full standing and controlled descent. rest break needed after 5 reps with pt stating "this is hard". min guard assist for safety.       Knee/Hip Exercises: Aerobic   Other Aerobic  Scifit Level 2.5 for 5 minutes with UE/LE's with goal >/=60 rpm for strengthening and  acttity tolerance.           PT Short Term Goals - 10/12/19 1612      PT SHORT TERM GOAL #1   Title  Pt will be I and compliant with HEP. (STG target date 4 weeks 11/11/19)    Status  New      PT SHORT TERM GOAL #2   Title  Pt will improve 5TSTS test to <40 seconds      PT SHORT TERM GOAL #3   Title  Pt will improve TUG to less than 40 seconds      PT SHORT TERM GOAL #4   Title  Pt will improve gait speed to >1 ft/sec with LRAD        PT Long Term Goals - 10/12/19 1614      PT LONG TERM GOAL #1   Title  Pt will improve TUG to less than 15 seconds LRAD    Time  12    Period  Weeks    Status  New      PT LONG TERM GOAL #2   Title  Pt will improve 5TSTS test to <20 seconds.    Time  12    Period  Weeks    Status  New      PT LONG TERM GOAL #3   Title  Pt will improve gait speed greater than 1.5 ft/sec LRAD    Time  12    Period  Weeks    Status  New      PT LONG TERM GOAL #4   Title  Pt will be able to ambuate at least 750 ft for community ambulaiton mod I with LRAD.    Time  12    Period  Weeks      PT LONG TERM GOAL #5   Title  Pt will be able to negotiate at least one flight of stairs with only one handrail mod I in order to visit with friends/family or have better access to community    Time  [place] - 10/28/19 0939    Clinical Impression Statement  Today's skilled session focused on LE, specifically hip, strengthening. No issues other than fatigue reported with rest breaks needed with session. The pt is making steady progress toward goals and should benefit from continued PT to progress toward unmet goals.    Personal Factors and Comorbidities  Age;Past/Current  Experience    Examination-Activity Limitations  Bathing;Transfers;Locomotion Level;Reach Overhead;Bed Mobility;Bend;Self Feeding;Carry;Squat;Dressing;Stairs;Hygiene/Grooming;Stand;Lift;Toileting    Examination-Participation Restrictions  Meal  Prep;Cleaning;Community Activity;Driving;Shop;Laundry;Yard Work    Merchant [employer] officer  Evolving/Moderate complexity    Rehab Potential  Good    PT Frequency  2x / week   2-3   PT Duration  12 weeks    PT Treatment/Interventions  ADLs/Self Care Home Management;Aquatic Therapy;Cryotherapy;English as a second language teacher;Therapeutic activities;Therapeutic exercise;Balance training;Neuromuscular re-education;Manual techniques;Orthotic Fit/Training;Patient/family education;Passive range of motion;Dry needling;Energy conservation;Splinting;Joint Manipulations;Taping    PT Next Visit Plan  needs gait, balance, Rt sided strengthening    PT Home Exercise Plan  Access Code: [email or web address], added stand hip abd, SL clams, bridge    Consulted and Agree with Plan of Care  Patient       Patient will benefit from skilled therapeutic intervention in order to improve the following deficits and impairments:  Abnormal gait, Decreased activity tolerance, Decreased balance, Decreased coordination, Decreased endurance, Decreased mobility, Decreased range of motion, Decreased strength, Difficulty walking, Hypomobility, Increased fascial restricitons, Increased muscle spasms, Impaired flexibility, Postural dysfunction, Pain  Visit Diagnosis: Hemiplegia and hemiparesis following cerebral infarction affecting right dominant side (HCC)  Other abnormalities of gait and mobility     Problem List Patient Active Problem List   Diagnosis Date Noted  . Dysphagia, post-stroke   . Diabetes mellitus type 2 in obese ([place])   . Acute blood loss anemia   . Hypokalemia   . Diabetic peripheral neuropathy ([place])   . Right knee pain   . 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 AMS (altered mental status)   . Incidental pulmonary nodule, > 29mm and < 68mm 06/01/2019  . CVA (cerebral vascular accident) ([place]) 05/31/2019  . 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 Gout of foot  01/11/2015  . Onychomycosis 01/11/2015  . Pain in lower limb 01/11/2015  . Hammer toe of right foot 01/02/2015  . Pain in right foot 01/02/2015    Willow Ora, PTA, [facility] [address], [place], [place] [address] [phone] 10/29/19, 11:35 AM   Name: Luke Rivera MRN: [id number] Date of Birth: 12/29/1940

## 2019-10-30 ENCOUNTER — Other Ambulatory Visit: Payer: Self-pay | Admitting: Physical Medicine & Rehabilitation

## 2019-11-02 ENCOUNTER — Encounter: Payer: Self-pay | Admitting: Physical Therapy

## 2019-11-02 ENCOUNTER — Other Ambulatory Visit: Payer: Self-pay

## 2019-11-02 ENCOUNTER — Ambulatory Visit: Payer: Medicare HMO | Admitting: Occupational Therapy

## 2019-11-02 ENCOUNTER — Ambulatory Visit: Payer: Medicare HMO

## 2019-11-02 ENCOUNTER — Ambulatory Visit: Payer: Medicare HMO | Admitting: Physical Therapy

## 2019-11-02 ENCOUNTER — Encounter: Payer: Self-pay | Admitting: Occupational Therapy

## 2019-11-02 DIAGNOSIS — R4701 Aphasia: Secondary | ICD-10-CM

## 2019-11-02 DIAGNOSIS — M6281 Muscle weakness (generalized): Secondary | ICD-10-CM

## 2019-11-02 DIAGNOSIS — I69318 Other symptoms and signs involving cognitive functions following cerebral infarction: Secondary | ICD-10-CM

## 2019-11-02 DIAGNOSIS — R2681 Unsteadiness on feet: Secondary | ICD-10-CM

## 2019-11-02 DIAGNOSIS — I69351 Hemiplegia and hemiparesis following cerebral infarction affecting right dominant side: Secondary | ICD-10-CM | POA: Diagnosis not present

## 2019-11-02 DIAGNOSIS — M79641 Pain in right hand: Secondary | ICD-10-CM

## 2019-11-02 DIAGNOSIS — R278 Other lack of coordination: Secondary | ICD-10-CM

## 2019-11-02 DIAGNOSIS — M79601 Pain in right arm: Secondary | ICD-10-CM

## 2019-11-02 DIAGNOSIS — R2689 Other abnormalities of gait and mobility: Secondary | ICD-10-CM

## 2019-11-02 NOTE — Therapy (Signed)
[facility] [address] [place], [place], [address] Phone: [phone]   Fax:  [phone]  Occupational Therapy Treatment  Patient Details  Name: Luke Rivera MRN: [id number] Date of Birth: Apr 29, 1941 Referring Provider (OT): Frann Rider   Encounter Date: 11/02/2019  OT End of Session - 11/02/19 1639    Visit Number  4    Number of Visits  17    Date for OT Re-Evaluation  12/21/19    Authorization Type  [employer] MCR    Authorization - Visit Number  4    Authorization - Number of Visits  10    OT Start Time  [id number]    OT Stop Time  1700    OT Time Calculation (min)  40 min    Activity Tolerance  Patient limited by pain       Past Medical History:  Diagnosis Date  . CAD (coronary artery disease)   . CHF (congestive heart failure) ([place])   . Chronic anticoagulation   . DM2 (diabetes mellitus, type 2) ([place])   . DVT (deep vein thrombosis) in pregnancy   . GERD (gastroesophageal reflux disease)   . HTN (hypertension)   . TIA (transient ischemic attack)     History reviewed. No pertinent surgical history.  There were no vitals filed for this visit.  Subjective Assessment - 11/02/19 1632    Subjective   I do not notice it is better and it is not worse.  I do it with soup cans - regarding shoulder exercises    Patient is accompanied by:  Family member    Currently in Pain?  Yes    Pain Score  5     Pain Location  Arm    Pain Orientation  Right    Pain Descriptors / Indicators  Aching    Pain Type  Acute pain    Pain Onset  More than a month ago    Pain Frequency  Intermittent    Aggravating Factors   movement    Pain Relieving Factors  takes tylenol - worse pain in early am                   OT Treatments/Exercises (OP) - 11/02/19 0001      Neurological Re-education Exercises   Other Exercises 1  Attempted to review prior HEP, but patient immediately experiencing 7/10 pain.  Patient may not  be completely accurate in scoring due to aphasia, but grimacing, and wincing.  Patient reports continuing to do exercises with soup cans - that are causing pain.  Discussed the importance of stopping all exercises that cause pain.  Worked on body on arm movement rolling toward and away from right shoulder to allow active relaxation.  Patient with a good deal of joint noise, cracking, popping with passive motion.  Patient point tender over spine of scapula.  Patient had fall from bed when originally home from hospital and has had shoulder pain since this time.  Wife indicated they were going to [facility] tomorrow, and asked her to speak with MD regarding shoulder assessment - possible xray, question of integrity of rotator cuff.  Feel the need to rule out orthopedic injury as potential cause or contriuting factor to shoulder pain.               OT Education - 11/02/19 1639    Education Details  stop any exercise that causes pain    Person(s) Educated  Patient;Spouse  Methods  Explanation    Comprehension  Verbalized understanding       OT Short Term Goals - 10/26/19 1644      OT SHORT TERM GOAL #1   Title  Independent with HEP for RUE    Status  On-going      OT SHORT TERM GOAL #2   Title  Pt to report pain less than or equal to 3/10 RUE for functional ADLS    Status  On-going      OT SHORT TERM GOAL #3   Title  Pt to write full sentence with 90% or greater legibility    Status  On-going      OT SHORT TERM GOAL #4   Title  Pt to use Rt dominant hand for eating 50% of the time and grooming 25% of the time    Status  On-going      OT SHORT TERM GOAL #5   Title  Pt to improve coordination Rt hand as evidenced by reducing speed on 9 hole peg test to 65 sec. or under    Status  On-going      OT SHORT TERM GOAL #6   Title  Pt to don/doff shoes and socks mod I level with A/E PRN    Status  On-going      OT SHORT TERM GOAL #7   Title  Pt to improve Rt shoulder flex to 110* in  prep for high level reaching    Status  On-going        OT Long Term Goals - 10/21/19 2124      OT LONG TERM GOAL #1   Title  Independent with updated HEP    Time  8    Period  Weeks    Status  New      OT LONG TERM GOAL #2   Title  Pt to use Rt dominant hand for eating 75% of the time or more, and for grooming 50% of the time or more    Time  8    Period  Weeks    Status  New      OT LONG TERM GOAL #3   Title  Pt to improve grip strength Rt hand to 30 lbs or greater    Baseline  14 lbs    Time  8    Period  Weeks    Status  New      OT LONG TERM GOAL #4   Title  Pt to return to light cooking with supervision prn    Time  8    Period  Weeks    Status  New      OT LONG TERM GOAL #5   Title  Pt to improve coordination Rt hand as evidenced by performing 9 hole peg test in 55 sec. or under    Baseline  76.59 sec    Time  8    Period  Weeks    Status  New      Long Term Additional Goals   Additional Long Term Goals  Yes      OT LONG TERM GOAL #6   Title  Pt to perform high level reaching RUE to retrieve/replace light weight objects on high shelf 5/5 trials    Time  8    Period  Weeks    Status  New            Plan - 11/02/19 1640    Clinical Impression Statement  Patient with continued report of pain with active or passive movement in Right shoulder    OT Frequency  2x / week    OT Duration  8 weeks    OT Treatment/Interventions  Self-care/ADL training;Moist Heat;Fluidtherapy;DME and/or AE instruction;Splinting;Aquatic Therapy;Therapeutic activities;Ultrasound;Therapeutic exercise;Cognitive remediation/compensation;Neuromuscular education;Functional Mobility Training;Passive range of motion;Visual/perceptual remediation/compensation;Electrical Stimulation;Paraffin;Manual Therapy;Patient/family education    Plan  NMR RUE/trunk, decrease shoulder pain.  Did they xray?    Consulted and Agree with Plan of Care  Patient;Family member/caregiver    Family Member  Consulted  wife       Patient will benefit from skilled therapeutic intervention in order to improve the following deficits and impairments:           Visit Diagnosis: Hemiplegia and hemiparesis following cerebral infarction affecting right dominant side (HCC)  Pain in right arm  Muscle weakness (generalized)  Other lack of coordination  Pain in right hand  Other symptoms and signs involving cognitive functions following cerebral infarction  Unsteadiness on feet    Problem List Patient Active Problem List   Diagnosis Date Noted  . Dysphagia, post-stroke   . Diabetes mellitus type 2 in obese ([place])   . Acute blood loss anemia   . Hypokalemia   . Diabetic peripheral neuropathy ([place])   . Right knee pain   . Acute ischemic right MCA stroke ([place]) 06/07/2019  . AMS (altered mental status)   . Incidental pulmonary nodule, > 79mm and < 32mm 06/01/2019  . CVA (cerebral vascular accident) ([place]) 05/31/2019  . 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 Onychomycosis 01/11/2015  . Pain in lower limb 01/11/2015  . Hammer toe of right foot 01/02/2015  . Pain in right foot 01/02/2015    Mariah Milling, OTR/L 11/02/2019, 4:42 PM  [facility] [address] [place], [place], [address] Phone: [phone]   Fax:  [phone]  Name: Luke Rivera MRN: [id number] Date of Birth: Jan 18, 1941

## 2019-11-02 NOTE — Therapy (Signed)
[facility] [address] [place], [place], [address] Phone: [phone]   Fax:  [phone]  Speech Language Pathology Treatment  Patient Details  Name: Luke Rivera MRN: [id number] Date of Birth: 16-Apr-1941 Referring Provider (SLP): Frann Rider, NP   Encounter Date: 11/02/2019  End of Session - 11/02/19 1751    Visit Number  5    Number of Visits  17    Date for SLP Re-Evaluation  01/10/20    SLP Start Time  [id number]    SLP Stop Time   1530    SLP Time Calculation (min)  41 min    Activity Tolerance  Patient tolerated treatment well       Past Medical History:  Diagnosis Date  . CAD (coronary artery disease)   . CHF (congestive heart failure) ([place])   . Chronic anticoagulation   . DM2 (diabetes mellitus, type 2) ([place])   . DVT (deep vein thrombosis) in pregnancy   . GERD (gastroesophageal reflux disease)   . HTN (hypertension)   . TIA (transient ischemic attack)     History reviewed. No pertinent surgical history.  There were no vitals filed for this visit.  Subjective Assessment - 11/02/19 1530    Subjective  "Where are you leading me?" Pt indicated he sees him "talking quicker but (I'm) not sure about variable."            ADULT SLP TREATMENT - 11/02/19 1453      General Information   Behavior/Cognition  Alert;Cooperative;Lethargic      Treatment Provided   Treatment provided  Cognitive-Linquistic      Cognitive-Linquistic Treatment   Treatment focused on  Aphasia    Skilled Treatment  "Nothin to - - talk about really," re: work on his speech at home. SLP asked about PT and OT exercises at home and pt told SLP about PT in sessions - when SLP asked again pt answered correctly. As pt strategy with anomia is to "think about it, and think about it, and think about it" SLP educated pt about copmensations for anomia description, circumlocution, and synonym, and nonverbal cues: gestures and drawing. Pt  telling SLP strategies he used premorbidly to recall names of people and use of visualization to recall names. SLP endorsed that could be another way to recall names of people, places and streets. SLP had pt practice word finding with describing each card in specific sequences of cards. Pt req'd mod cues consistently for more specific speech due to more vague speech initially. Pt appeared mostly unconcerned with this. With pt's "s", SLP had to explain what the purpose of ST was to pt. Wife indicated pt has not been as consistent as he could be with effort for improvement after CVA (wife: "He watches a lot of TV" and pt responded: "I do my exercises in my chair, and I walk.")      Assessment / Recommendations / Plan   Plan  Continue with current plan of care      Progression Toward Goals   Progression toward goals  Progressing toward goals       SLP Education - 11/02/19 1751    Education Details  verbal and nonverbl compensatoins for anomia, pt will need to put forth effort at home for improved language ability    Person(s) Educated  Patient;Spouse    Methods  Explanation    Comprehension  Verbalized understanding       SLP Short Term Goals - 11/02/19 1753  SLP SHORT TERM GOAL #1   Title  pt to produce 18/20 functional sentence responses in structured therapy tasks over three sessions    Time  1    Period  Weeks    Status  On-going      SLP SHORT TERM GOAL #2   Title  pt will participate in assessment of reading comprehension and written expression    Status  Achieved      SLP SHORT TERM GOAL #3   Title  pt will participate in 8 minutes simple conversation over three sessions, functionally/with modified independence (compensations for anomia)    Time  1    Period  Weeks    Status  On-going       SLP Long Term Goals - 11/02/19 1753      SLP LONG TERM GOAL #1   Title  pt will participate in 10 minutes simple to mod complex conversation with modified independence,  functionally, in 3 sessions    Time  5    Period  Weeks    Status  On-going      SLP LONG TERM GOAL #2   Title  pt will use functional verbal expression in 8 minutes mod complex conversation with rare min A for anomia over three sessions    Time  5    Period  Weeks    Status  On-going       Plan - 11/02/19 1752    Clinical Impression Statement  Pt wife indicates pt is not as consistent as he could be about work with ST tasks/speech at home. 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 Assessed reading and writing WAB-R; pt/wife prefer to focus on verbal communication with ST at this time.  Pt would benefit from cont'd skilled ST targeting verbal expression.    Speech Therapy Frequency  2x / week    Duration  --   8 weeks, or 17 visits   Treatment/Interventions  Functional tasks;Oral motor exercises;Multimodal communcation approach;SLP instruction and feedback;Compensatory strategies;Patient/family education;Internal/external aids;Cueing hierarchy;Language facilitation    Potential to Achieve Goals  Good    Consulted and Agree with Plan of Care  Patient       Patient will benefit from skilled therapeutic intervention in order to improve the following deficits and impairments:   Aphasia    Problem List Patient Active Problem List   Diagnosis Date Noted  . Dysphagia, post-stroke   . Diabetes mellitus type 2 in obese ([place])   . Acute blood loss anemia   . Hypokalemia   . Diabetic peripheral neuropathy ([place])   . Right knee pain   . 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 AMS (altered mental status)   . Incidental pulmonary nodule, > 18mm and < 53mm 06/01/2019  . CVA (cerebral vascular accident) ([place]) 05/31/2019  . 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  Onychomycosis 01/11/2015  . Pain in lower limb 01/11/2015  . Hammer toe of right foot 01/02/2015  . Pain in right foot 01/02/2015    [facility] ,[place], CCC-SLP  11/02/2019, 5:53 PM  [facility] [address] [place], [place], [address] Phone: [phone]   Fax:  [phone]   Name: Luke Rivera MRN: [id number] Date of Birth: June 21, 1941

## 2019-11-02 NOTE — Therapy (Signed)
[facility] [address] [place], [place], [address] Phone: [phone]   Fax:  [phone]  Physical Therapy Treatment  Patient Details  Name: Luke Rivera MRN: [id number] Date of Birth: [birth year] Referring Provider (PT): Frann Rider, NP   Encounter Date: 11/02/2019  PT End of Session - 11/02/19 1713    Visit Number  6    Number of Visits  24    Date for PT Re-Evaluation  01/04/20    Authorization Type  Aetna MCR    PT Start Time  1615    PT Stop Time  1700    PT Time Calculation (min)  45 min    Equipment Utilized During Treatment  Gait belt    Activity Tolerance  Patient tolerated treatment well;Patient limited by fatigue    Behavior During Therapy  [facility] for tasks assessed/performed       Past Medical History:  Diagnosis Date  . CAD (coronary artery disease)   . CHF (congestive heart failure) ([place])   . Chronic anticoagulation   . DM2 (diabetes mellitus, type 2) ([place])   . DVT (deep vein thrombosis) in pregnancy   . GERD (gastroesophageal reflux disease)   . HTN (hypertension)   . TIA (transient ischemic attack)     History reviewed. No pertinent surgical history.  There were no vitals filed for this visit.  Subjective Assessment - 11/02/19 1650    Subjective  "I'm tired today but ill do whatever you tell me to" The only pain is in my Rt shoulder/hand    How long can you stand comfortably?  10 min    How long can you walk comfortably?  10 min    Patient Stated Goals  get back to where he used to be    Currently in Pain?  Yes    Pain Score  5     Pain Location  Shoulder    Pain Orientation  Right    Pain Onset  More than a month ago                       [facility] Adult PT Treatment/Exercise - 11/02/19 0001      Transfers   Transfers  Sit to Stand;Stand to Sit    Sit to Stand  5: Supervision;4: Min guard;With upper extremity assist;From bed;From chair/3-in-1    Stand to Sit  5:  Supervision;4: Min guard;With upper extremity assist;To bed;To chair/3-in-1    Comments  X10 reps spread out during session      Ambulation/Gait   Ambulation/Gait  Yes    Ambulation/Gait Assistance  5: Supervision    Ambulation/Gait Assistance Details  cues to control ER of Rt foot and able to improve with this some    Ambulation Distance (Feet)  --   115 X 2   Assistive device  Rolling walker    Ambulation Surface  Level;Indoor      Exercises   Other Exercises   Sci fit for 5 min L5, stairs up/down X 2 (bilat UE support step to pattern) with cues for technique to step up with the good leg, down with the bad, able to return demonstrate with min A for first set, then close supervision for second set. He then performed step ups on 6 inch step with bilat UE support X 5 reps on Rt then 5 reps on Lt. Then performed seated hip abduction with green band X 20 reps, then LAQ 2 lbs X 20  reps, then seated marches 2X10 reps 2 lbs.                PT Short Term Goals - 10/12/19 1612      PT SHORT TERM GOAL #1   Title  Pt will be I and compliant with HEP. (STG target date 4 weeks 11/11/19)    Status  New      PT SHORT TERM GOAL #2   Title  Pt will improve 5TSTS test to <40 seconds      PT SHORT TERM GOAL #3   Title  Pt will improve TUG to less than 40 seconds      PT SHORT TERM GOAL #4   Title  Pt will improve gait speed to >1 ft/sec with LRAD        PT Long Term Goals - 10/12/19 1614      PT LONG TERM GOAL #1   Title  Pt will improve TUG to less than 15 seconds LRAD    Time  12    Period  Weeks    Status  New      PT LONG TERM GOAL #2   Title  Pt will improve 5TSTS test to <20 seconds.    Time  12    Period  Weeks    Status  New      PT LONG TERM GOAL #3   Title  Pt will improve gait speed greater than 1.5 ft/sec LRAD    Time  12    Period  Weeks    Status  New      PT LONG TERM GOAL #4   Title  Pt will be able to ambuate at least 750 ft for community ambulaiton mod I  with LRAD.    Time  12    Period  Weeks      PT LONG TERM GOAL #5   Title  Pt will be able to negotiate at least one flight of stairs with only one handrail mod I in order to visit with friends/family or have better access to community    Time  12    Period  Weeks    Status  New            Plan - 11/02/19 1714    Clinical Impression Statement  Able to progress to going up/down stairs today but does have some Rt shoulder pain with this. He was recommended to ask his MD for shoulder XR if MD feels this is appropriate due to crepitus and continued pain in his shoulder. He was more tired today during session as PT had him after OT and speech. He will continue to benefit from PT to progress his Rt sided strength, his gait, balance, and overall endurance.    Personal Factors and Comorbidities  Age;Past/Current Experience    Examination-Activity Limitations  Bathing;Transfers;Locomotion Level;Reach Overhead;Bed Mobility;Bend;Self Feeding;Carry;Squat;Dressing;Stairs;Hygiene/Grooming;Stand;Lift;Toileting    Examination-Participation Restrictions  Meal Prep;Cleaning;Community Activity;Driving;Shop;Laundry;Yard Work    [employer]  Evolving/Moderate complexity    Rehab Potential  Good    PT Frequency  2x / week   2-3   PT Duration  12 weeks    PT Treatment/Interventions  ADLs/Self Care Home Management;Aquatic Therapy;Cryotherapy;English as a second language teacher;Therapeutic activities;Therapeutic exercise;Balance training;Neuromuscular re-education;Manual techniques;Orthotic Fit/Training;Patient/family education;Passive range of motion;Dry needling;Energy conservation;Splinting;Joint Manipulations;Taping    PT Next Visit Plan  needs gait, balance, Rt sided strengthening    PT Home Exercise Plan  Access Code: [email or web address], added stand hip abd, SL clams,  bridge    Consulted and Agree with Plan of Care  Patient       Patient will benefit  from skilled therapeutic intervention in order to improve the following deficits and impairments:  Abnormal gait, Decreased activity tolerance, Decreased balance, Decreased coordination, Decreased endurance, Decreased mobility, Decreased range of motion, Decreased strength, Difficulty walking, Hypomobility, Increased fascial restricitons, Increased muscle spasms, Impaired flexibility, Postural dysfunction, Pain  Visit Diagnosis: Hemiplegia and hemiparesis following cerebral infarction affecting right dominant side (HCC)  Muscle weakness (generalized)  Other lack of coordination  Other symptoms and signs involving cognitive functions following cerebral infarction  Unsteadiness on feet  Other abnormalities of gait and mobility     Problem List Patient Active Problem List   Diagnosis Date Noted  . Dysphagia, post-stroke   . Diabetes mellitus type 2 in obese ([place])   . Acute blood loss anemia   . Hypokalemia   . Diabetic peripheral neuropathy ([place])   . Right knee pain   . Acute ischemic right MCA stroke ([place]) 06/07/2019  . AMS (altered mental status)   . Incidental pulmonary nodule, > 18mm and < 77mm 06/01/2019  . CVA (cerebral vascular accident) ([place]) 05/31/2019  . 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 Onychomycosis 01/11/2015  . Pain in lower limb 01/11/2015  . Hammer toe of right foot 01/02/2015  . Pain in right foot 01/02/2015    Silvestre Mesi 11/02/2019, 5:16 PM  [facility] [address] [place], [place], [address] Phone: [phone]   Fax:  [phone]  Name: Aceyn Kohls MRN: [id number] Date of Birth: [birth year]

## 2019-11-03 ENCOUNTER — Ambulatory Visit: Payer: Medicare HMO

## 2019-11-03 ENCOUNTER — Other Ambulatory Visit: Payer: Self-pay

## 2019-11-03 DIAGNOSIS — R2681 Unsteadiness on feet: Secondary | ICD-10-CM

## 2019-11-03 DIAGNOSIS — I69351 Hemiplegia and hemiparesis following cerebral infarction affecting right dominant side: Secondary | ICD-10-CM

## 2019-11-03 DIAGNOSIS — R4701 Aphasia: Secondary | ICD-10-CM

## 2019-11-03 DIAGNOSIS — R278 Other lack of coordination: Secondary | ICD-10-CM

## 2019-11-03 DIAGNOSIS — R2689 Other abnormalities of gait and mobility: Secondary | ICD-10-CM

## 2019-11-03 NOTE — Therapy (Signed)
[facility] [address] [place], [place], [address] Phone: [phone]   Fax:  [phone]  Speech Language Pathology Treatment  Patient Details  Name: Luke Rivera MRN: [id number] Date of Birth: 1941-01-09 Referring Provider (SLP): Luke Rider, NP   Encounter Date: 11/03/2019  End of Session - 11/03/19 1543    Visit Number  6    Number of Visits  17    Date for SLP Re-Evaluation  01/10/20    SLP Start Time  1157    SLP Stop Time   1530    SLP Time Calculation (min)  41 min    Activity Tolerance  Patient tolerated treatment well       Past Medical History:  Diagnosis Date  . CAD (coronary artery disease)   . CHF (congestive heart failure) ([place])   . Chronic anticoagulation   . DM2 (diabetes mellitus, type 2) ([place])   . DVT (deep vein thrombosis) in pregnancy   . GERD (gastroesophageal reflux disease)   . HTN (hypertension)   . TIA (transient ischemic attack)     History reviewed. No pertinent surgical history.  There were no vitals filed for this visit.  Subjective Assessment - 11/03/19 1457    Subjective  Pt arrives with his wife.    Currently in Pain?  No/denies            ADULT SLP TREATMENT - 11/03/19 1500      General Information   Behavior/Cognition  Alert;Cooperative;Lethargic      Treatment Provided   Treatment provided  Cognitive-Linquistic      Cognitive-Linquistic Treatment   Treatment focused on  Aphasia    Skilled Treatment  Using slowed rate, pt with thorough explanation of changes in [place] over the last few years - pt functional in language but not in speed - listener burden with time necessary to allow pt to communicate his message. In simple to mod complex conversation of 18 minutes with slowed rate, pt used description strategy and synonym strategies. SLP then lowered communicative burden to speak about pt's car/s and pt con't with slower talking rate but not as slow as in  simple-mod complex conversation. Pt smiling and telling jokes during today's session - different from yesterday as pt appeared less tolerant of ST services yesterday.       Assessment / Recommendations / Plan   Plan  Continue with current plan of care      Progression Toward Goals   Progression toward goals  Progressing toward goals         SLP Short Term Goals - 11/03/19 1508      SLP SHORT TERM GOAL #1   Title  pt to produce 18/20 functional sentence responses in structured therapy tasks over three sessions    Baseline  11-03-19    Status  Partially Met      SLP SHORT TERM GOAL #2   Title  pt will participate in assessment of reading comprehension and written expression    Status  Achieved      SLP SHORT TERM GOAL #3   Title  pt will participate in 8 minutes simple conversation over three sessions, functionally/with modified independence (compensations for anomia)    Baseline  11-03-19    Status  Partially Met       SLP Long Term Goals - 11/03/19 1515      SLP LONG TERM GOAL #1   Title  pt will participate in 10 minutes simple to  mod complex conversation with modified independence, functionally, in 3 sessions    Time  5    Period  Weeks    Status  On-going      SLP LONG TERM GOAL #2   Title  pt will use functional verbal expression in 8 minutes mod complex conversation with rare min A for anomia over three sessions    Time  5    Period  Weeks    Status  On-going       Plan - 11/03/19 1543    Clinical Impression Statement  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  Pt would benefit from cont'd skilled ST targeting verbal expression.    Speech Therapy Frequency  2x / week    Duration  --   8 weeks, or  17 visits   Treatment/Interventions  Functional tasks;Oral motor exercises;Multimodal communcation approach;SLP instruction and feedback;Compensatory strategies;Patient/family education;Internal/external aids;Cueing hierarchy;Language facilitation    Potential to Achieve Goals  Good    Consulted and Agree with Plan of Care  Patient       Patient will benefit from skilled therapeutic intervention in order to improve the following deficits and impairments:   Aphasia    Problem List Patient Active Problem List   Diagnosis Date Noted  . Dysphagia, post-stroke   . Diabetes mellitus type 2 in obese ([place])   . Acute blood loss anemia   . Hypokalemia   . Diabetic peripheral neuropathy ([place])   . Right knee pain   . Acute ischemic right MCA stroke ([place]) 06/07/2019  . AMS (altered mental status)   . Incidental pulmonary nodule, > 64m and < 845m06/23/2020  . CVA (cerebral vascular accident) ([facility]/22/2020  . 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 Onychomycosis 01/11/2015  . Pain in lower limb 01/11/2015  . Hammer toe of right foot 01/02/2015  . Pain in right foot 01/02/2015    SCWest Springs HospitalMS, CCC-SLP  11/03/2019, 3:44 PM  [address]hone: [phone] Fax:  [phone] Name: ThShafer SwamyRN: [id number] of Birth: 3/Sep 08, 1941

## 2019-11-03 NOTE — Therapy (Signed)
[facility] [address] [place], [place], [address] Phone: [phone]   Fax:  [phone]  Physical Therapy Treatment  Patient Details  Name: Luke Rivera MRN: [id number] Date of Birth: [birth year] Referring Provider (PT): Frann Rider, NP   Encounter Date: 11/03/2019  PT End of Session - 11/03/19 1636    Visit Number  7    Number of Visits  24    Date for PT Re-Evaluation  01/04/20    Authorization Type  [employer] MCR    PT Start Time  1540    PT Stop Time  1619    PT Time Calculation (min)  39 min    Equipment Utilized During Treatment  --   min gaurd to S prn   Activity Tolerance  Patient tolerated treatment well    Behavior During Therapy  [facility] for tasks assessed/performed       Past Medical History:  Diagnosis Date  . CAD (coronary artery disease)   . CHF (congestive heart failure) ([place])   . Chronic anticoagulation   . DM2 (diabetes mellitus, type 2) ([place])   . DVT (deep vein thrombosis) in pregnancy   . GERD (gastroesophageal reflux disease)   . HTN (hypertension)   . TIA (transient ischemic attack)     History reviewed. No pertinent surgical history.  There were no vitals filed for this visit.  Subjective Assessment - 11/03/19 1543    Subjective  Pt denied falls or changes since last visit.    Patient is accompained by:  Family member    Patient Stated Goals  get back to where he used to be    Currently in Pain?  No/denies                       [facility] Adult PT Treatment/Exercise - 11/03/19 1631      Transfers   Transfers  Sit to Stand;Stand to Sit    Sit to Stand  5: Supervision;4: Min guard;With upper extremity assist;Multiple attempts;Uncontrolled descent   from compliant mat   Sit to Stand Details  Verbal cues for sequencing;Verbal cues for technique;Tactile cues for placement;Tactile cues for weight shifting;Tactile cues for sequencing;Manual facilitation for weight bearing     Sit to Stand Details (indicate cue type and reason)  Cues for sequencing with RW, scooting EOB, and improved ant. weight shifting forward.    Stand to Sit  5: Supervision;4: Min guard;With upper extremity assist;Uncontrolled descent   to mat   Stand to Sit Details (indicate cue type and reason)  Verbal cues for sequencing;Verbal cues for technique;Manual facilitation for placement;Verbal cues for precautions/safety    Number of Reps  10 reps      Ambulation/Gait   Ambulation/Gait  Yes    Ambulation/Gait Assistance  5: Supervision    Ambulation/Gait Assistance Details  Cues to improve upright posture, heel strike, decr. R genu recurvatum in midstance. PT adjusted RW height and added tennis balls so pt would cease lifting RW.  Cues to improve B hip protraction during midstance vs. flexed trunk.    Ambulation Distance (Feet)  50 Feet   x2, 115', 80'x2   Assistive device  Rolling walker    Gait Pattern  Decreased step length - left;Decreased stance time - right;Decreased hip/knee flexion - right;Decreased dorsiflexion - right;Decreased weight shift to right;Wide base of support;Poor foot clearance - right;Decreased stride length    Ambulation Surface  Level;Indoor    Pre-Gait Activities  NMR: In //bars  with 1" beam: pt performed ant. stepping over beam with tactile and verbal cues and demo for proper technique. 2x10 reps/LE. Pt also performed lateral weight shifting to improve wt. shift to RLE.      High Level Balance   High Level Balance Activities  Backward walking    High Level Balance Comments  2x10' with one UE support and min guard for safety. Cues to improve posture and stride length.             PT Education - 11/03/19 1635    Education Details  PT educated pt on adjusting RW height and how to safely improve sit<>stand txfs at home.    Person(s) Educated  Patient;Spouse    Methods  Explanation;Demonstration;Tactile cues;Verbal cues    Comprehension  Returned  demonstration;Verbalized understanding;Need further instruction       PT Short Term Goals - 10/12/19 1612      PT SHORT TERM GOAL #1   Title  Pt will be I and compliant with HEP. (STG target date 4 weeks 11/11/19)    Status  New      PT SHORT TERM GOAL #2   Title  Pt will improve 5TSTS test to <40 seconds      PT SHORT TERM GOAL #3   Title  Pt will improve TUG to less than 40 seconds      PT SHORT TERM GOAL #4   Title  Pt will improve gait speed to >1 ft/sec with LRAD        PT Long Term Goals - 10/12/19 1614      PT LONG TERM GOAL #1   Title  Pt will improve TUG to less than 15 seconds LRAD    Time  12    Period  Weeks    Status  New      PT LONG TERM GOAL #2   Title  Pt will improve 5TSTS test to <20 seconds.    Time  12    Period  Weeks    Status  New      PT LONG TERM GOAL #3   Title  Pt will improve gait speed greater than 1.5 ft/sec LRAD    Time  12    Period  Weeks    Status  New      PT LONG TERM GOAL #4   Title  Pt will be able to ambuate at least 750 ft for community ambulaiton mod I with LRAD.    Time  12    Period  Weeks      PT LONG TERM GOAL #5   Title  Pt will be able to negotiate at least one flight of stairs with only one handrail mod I in order to visit with friends/family or have better access to community    Time  12    Period  Weeks    Status  New            Plan - 11/03/19 1637    Clinical Impression Statement  Pt demonstrated progress, as he was able to improve upright posture during gait. Pt also able to improve eccentric control during stand to sit txfs with RW and UE assist. Pt require tasks broken down to allow time to process. Pt would continue to benefit from skilled PT to improve safety during functional mobility.    Personal Factors and Comorbidities  Age;Past/Current Experience    Examination-Activity Limitations  Bathing;Transfers;Locomotion Level;Reach Overhead;Bed Mobility;Bend;Self  Feeding;Carry;Squat;Dressing;Stairs;Hygiene/Grooming;Stand;Lift;Toileting  Examination-Participation Restrictions  Meal Prep;Cleaning;Community Activity;Driving;Shop;Laundry;Yard Work    Merchant navy officer  Evolving/Moderate complexity    Rehab Potential  Good    PT Frequency  2x / week   2-3   PT Duration  12 weeks    PT Treatment/Interventions  ADLs/Self Care Home Management;Aquatic Therapy;Cryotherapy;English as a second language teacher;Therapeutic activities;Therapeutic exercise;Balance training;Neuromuscular re-education;Manual techniques;Orthotic Fit/Training;Patient/family education;Passive range of motion;Dry needling;Energy conservation;Splinting;Joint Manipulations;Taping    PT Next Visit Plan  Begin to assess STGs. Weight shifting (lateral in standing), gait training with R AFO donned and cues to improve hips over foot in midstance vs. flexed posture. Hamstring strength to reduce R genu recurvatum in midstance.    PT Home Exercise Plan  Access Code: [email or web address], added stand hip abd, SL clams, bridge    Consulted and Agree with Plan of Care  Patient       Patient will benefit from skilled therapeutic intervention in order to improve the following deficits and impairments:  Abnormal gait, Decreased activity tolerance, Decreased balance, Decreased coordination, Decreased endurance, Decreased mobility, Decreased range of motion, Decreased strength, Difficulty walking, Hypomobility, Increased fascial restricitons, Increased muscle spasms, Impaired flexibility, Postural dysfunction, Pain  Visit Diagnosis: Other abnormalities of gait and mobility  Unsteadiness on feet  Other lack of coordination  Hemiplegia and hemiparesis following cerebral infarction affecting right dominant side [facility])     Problem List Patient Active Problem List   Diagnosis Date Noted  . Dysphagia, post-stroke   . Diabetes mellitus type 2 in obese ([place])   .  Acute blood loss anemia   . Hypokalemia   . Diabetic peripheral neuropathy ([place])   . Right knee pain   . Acute ischemic right MCA stroke ([place]) 06/07/2019  . AMS (altered mental status)   . Incidental pulmonary nodule, > 42mm and < 62mm 06/01/2019  . CVA (cerebral vascular accident) ([place]) 05/31/2019  . 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 Onychomycosis 01/11/2015  . Pain in lower limb 01/11/2015  . Hammer toe of right foot 01/02/2015  . Pain in right foot 01/02/2015    Luke Rivera L 11/03/2019, 4:41 PM  [facility] [address] [place], [place], [address] Phone: [phone]   Fax:  [phone]  Name: Luke Rivera MRN: [id number] Date of Birth: [birth year]  Geoffry Paradise, PT,DPT 11/03/19 4:42 PM Phone: [phone] Fax: [phone]

## 2019-11-08 ENCOUNTER — Encounter: Payer: Medicare HMO | Attending: Physical Medicine & Rehabilitation | Admitting: Psychology

## 2019-11-08 ENCOUNTER — Other Ambulatory Visit: Payer: Self-pay

## 2019-11-08 ENCOUNTER — Encounter: Payer: Self-pay | Admitting: Psychology

## 2019-11-08 DIAGNOSIS — I69351 Hemiplegia and hemiparesis following cerebral infarction affecting right dominant side: Secondary | ICD-10-CM | POA: Diagnosis not present

## 2019-11-08 DIAGNOSIS — F4323 Adjustment disorder with mixed anxiety and depressed mood: Secondary | ICD-10-CM | POA: Diagnosis present

## 2019-11-08 DIAGNOSIS — I69920 Aphasia following unspecified cerebrovascular disease: Secondary | ICD-10-CM

## 2019-11-08 NOTE — Progress Notes (Signed)
Neuropsychological Consultation   Patient:   Luke Rivera   DOB:   October 04, 1941  MR Number:  [id number]  Location:  [facility] [place], [place] [id number] [place] [address] Dept: [phone]           Date of Service:   11/08/2019  Start Time:   2 PM End Time:   4 PM  Provider/Observer:  Ilean Skill, Psy.D.       Clinical Neuropsychologist       Billing Code/Service: [phone], [phone]  Chief Complaint:    Luke Rivera is an 78 year old male who has a history of CAD/CAF, DVT, TIA, type 2 diabetes with peripheral neuropathy, multiple back surgeries with limited mobility who was initially seen by our treatment team on the inpatient unit after being admitted on 05/31/2019.  The patient initially had garbled speech and right-sided weakness.  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  The patient has continued to have issues with right-sided motor deficits that are improving significantly and continued expressive language issues.  The patient has been followed by Dr. Kasandra Knudsen with [facility] neurolog[facility] as well as Dr. Read Drivers with physical medicine.  He has also been continued to see rehabilitation services for PT/OT/speech.  Reason for Service:  Luke Rivera is an 78 year old male who has a history of CAD/CAF, DVT, TIA, type 2 diabetes with peripheral neuropathy, multiple back surgeries with limited mobility who was initially seen by our treatment team on the inpatient unit after being admitted on 05/31/2019.  The patient initially had garbled speech and right-sided weakness.  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  The  patient has continued to have issues with right-sided motor deficits that are improving significantly and continued expressive language issues.  The patient has been followed by Dr. Kasandra Knudsen with [facility] neurologic Associates as well as Dr. Elnita Maxwell with physical medicine.  He has also been continued to see rehabilitation services for PT/OT/speech.  The patient has continued with significant mobility issues, significant pain in his right shoulder which they are evaluating as to whether it is neurological pain in nature or related to some orthopedic issues, problems with his motor function in his hand post stroke as well as changes in expressive language function.  However, memory and other cognitive functioning appears to remain intact.  His stroke And on May 31, 2019.  There is also indications of previous TIA.  The patient had increased irritation and irritability during his hospital course and this persisted after discharge.  Dr. Letta Pate has started him on Celexa, and the patient's wife reports that he had a very positive response to this medication and has continued to take it.  However, the patient has had times of significant motivation and effort concerns that are vocalized by both the patient's wife as well as having an impact on some of his therapeutic efforts.  The patient is continued with rehabilitation services since his discharge in July of this year.  He has been doing rehab with the [facility] system at home and now he is working with [facility] rehab in [place] doing both OT, PT and speech therapy.  The patient reports that he sleeps from 11 PM at night to  9 or 10 in the morning and feels like his sleep is adequate.  The patient has a good appetite most days and describes his memory is within normal limits and at baseline.  Current Status:  The patient continues to have residual motor deficits particularly with his right hand.  He has a long history of multiple back surgeries that also have an impact  on his mobility.  The patient continues to have some expressive language deficits but these have significantly improved and does continue to have some fluency and word finding issues but overall he is able to effectively communicate and express himself.  Depressive symptoms have improved with SSRI medication but continued to be an issue and having a negative impact on both therapeutic interventions as well as behavioral impacts at home.  The patient's wife reports that there are days where he simply does not feel well and will do little but sit in his recliner.  He has had some GI symptoms that he attributes his inactivity to at times.  Reliability of Information: Information is derived from 1 hour face-to-face clinical interview as well as review of medical records.  Behavioral Observation: Luke Rivera  presents as a 78 y.o.-year-old Right Caucasian Male who appeared his stated age. his dress was Appropriate and he was Well Groomed and his manners were Appropriate to the situation.  his participation was indicative of Appropriate and Attentive behaviors.  There were any physical disabilities noted.  he displayed an appropriate level of cooperation and motivation.     Interactions:    Active Appropriate and Attentive  Attention:   within normal limits and attention span and concentration were age appropriate  Memory:   within normal limits; recent and remote memory intact  Visuo-spatial:  not examined  Speech (Volume):  low  Speech:   fluent aphasia; mild word finding and verbal fluency issues  Thought Process:  Coherent and Relevant  Though Content:  WNL; not suicidal and not homicidal  Orientation:   person, place, time/date and situation  Judgment:   Good  Planning:   Good  Affect:    Appropriate  Mood:    Dysphoric  Insight:   Fair  Intelligence:   high  Marital Status/Living: The patient was born and raised in [place] with 1 brother.  The patient lives with his  wife and they have been married for 15 years.  The patient has been married previously with first marriage lasting 60 years until his wife passed away.  The patient has 3 daughters.  Current Employment: The patient is retired.  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  Hobbies and interests include golf and listening to music.  The patient was in [employer] between [place].  His highest rank and rank at discharge was E4 the patient had an honorable discharge.  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  Substance Use:  No concerns of substance abuse are reported.    Education:   HS Graduate  Medical History:   Past Medical History:  Diagnosis Date  . CAD (coronary artery disease)   . CHF (congestive heart failure) ([place])   . Chronic anticoagulation   . DM2 (diabetes mellitus, type 2) ([place])   . DVT (deep vein thrombosis) in pregnancy   . GERD (gastroesophageal reflux disease)   . HTN (hypertension)   . TIA (transient ischemic attack)      Psychiatric History:  The  patient has no prior psychiatric history  Family Med/Psych History:  Family History  Problem Relation Age of Onset  . Hypertension Father   . Heart disease Mother   . Heart attack Brother     Impression/DX:  Luke Rivera is an 78 year old male who has a history of CAD/CAF, DVT, TIA, type 2 diabetes with peripheral neuropathy, multiple back surgeries with limited mobility who was initially seen by our treatment team on the inpatient unit after being admitted on 05/31/2019.  The patient initially had garbled speech and right-sided weakness.  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  The patient has continued to have issues with right-sided motor deficits that are improving significantly and continued expressive language issues.   The patient has been followed by Dr. Kasandra Knudsen with [facility] neurologic Associates as well as Dr. Read Drivers with physical medicine.  He has also been continued to see rehabilitation services for PT/OT/speech.  The patient continues to have residual motor deficits particularly with his right hand.  He has a long history of multiple back surgeries that also have an impact on his mobility.  The patient continues to have some expressive language deficits but these have significantly improved and does continue to have some fluency and word finding issues but overall he is able to effectively communicate and express himself.  Depressive symptoms have improved with SSRI medication but continued to be an issue and having a negative impact on both therapeutic interventions as well as behavioral impacts at home.  The patient's wife reports that there are days where he simply does not feel well and will do little but sit in his recliner.  He has had some GI symptoms that he attributes his inactivity to at times.  Disposition/Plan:  We have set the patient up for psychotherapeutic interventions to work on coping and adjustment issues.  The patient has had increased agitation and irritation and symptoms consistent with depressive symptomatology following his significant loss of function due to cerebrovascular accident.  We have initially set up for appointments 2 weeks apart and while there will be a significant delay between 4 the session started if there is any acute changes in his status he is to let our office know.  The patient is continuing to take Celexa with good response.  Diagnosis:    Hemiparesis affecting right side as late effect of cerebrovascular accident (CVA) ([place])  Aphasia, late effect of cerebrovascular disease         Electronically Signed   _______________________ Ilean Skill, Psy.D.

## 2019-11-09 ENCOUNTER — Ambulatory Visit: Payer: Medicare HMO | Admitting: Speech Pathology

## 2019-11-09 ENCOUNTER — Encounter: Payer: Self-pay | Admitting: Occupational Therapy

## 2019-11-09 ENCOUNTER — Ambulatory Visit: Payer: Medicare HMO | Admitting: Physical Therapy

## 2019-11-09 ENCOUNTER — Ambulatory Visit: Payer: Medicare HMO | Attending: Adult Health | Admitting: Occupational Therapy

## 2019-11-09 ENCOUNTER — Other Ambulatory Visit: Payer: Self-pay

## 2019-11-09 DIAGNOSIS — R4701 Aphasia: Secondary | ICD-10-CM

## 2019-11-09 DIAGNOSIS — R2681 Unsteadiness on feet: Secondary | ICD-10-CM | POA: Diagnosis not present

## 2019-11-09 DIAGNOSIS — R278 Other lack of coordination: Secondary | ICD-10-CM

## 2019-11-09 DIAGNOSIS — M79601 Pain in right arm: Secondary | ICD-10-CM | POA: Diagnosis present

## 2019-11-09 DIAGNOSIS — R2689 Other abnormalities of gait and mobility: Secondary | ICD-10-CM | POA: Insufficient documentation

## 2019-11-09 DIAGNOSIS — M6281 Muscle weakness (generalized): Secondary | ICD-10-CM

## 2019-11-09 DIAGNOSIS — I69351 Hemiplegia and hemiparesis following cerebral infarction affecting right dominant side: Secondary | ICD-10-CM | POA: Diagnosis present

## 2019-11-09 DIAGNOSIS — M79641 Pain in right hand: Secondary | ICD-10-CM | POA: Diagnosis present

## 2019-11-09 DIAGNOSIS — I69318 Other symptoms and signs involving cognitive functions following cerebral infarction: Secondary | ICD-10-CM

## 2019-11-09 NOTE — Patient Instructions (Signed)
  Coordination Activities  Perform the following activities for minutes 1-2 times per day with right hand(s). Spend about 5-10 minutes each day working on your coordination. Do not work to the point of frustration.     Rotate ball in fingertips (clockwise and counter-clockwise).  Flip cards 1 at a time as fast as you can.  Rotate card in hand (clockwise and counter-clockwise).  Shuffle cards.  Pick up coins and place in container or coin bank.  Pick up coins and stack.

## 2019-11-09 NOTE — Therapy (Signed)
[facility] [address] [place], [place], [address] Phone: [phone]   Fax:  [phone]  Occupational Therapy Treatment  Patient Details  Name: Luke Rivera MRN: [id number] Date of Birth: 22-Apr-1941 Referring Provider (OT): Frann Rider   Encounter Date: 11/09/2019  OT End of Session - 11/09/19 1854    Visit Number  5    Number of Visits  17    Date for OT Re-Evaluation  12/21/19    Authorization Type  [employer] MCR    Authorization - Visit Number  5    Authorization - Number of Visits  10    OT Start Time  [id number]    OT Stop Time  [id number]    OT Time Calculation (min)  44 min    Activity Tolerance  Patient tolerated treatment well    Behavior During Therapy  [facility] for tasks assessed/performed       Past Medical History:  Diagnosis Date  . CAD (coronary artery disease)   . CHF (congestive heart failure) ([place])   . Chronic anticoagulation   . DM2 (diabetes mellitus, type 2) ([place])   . DVT (deep vein thrombosis) in pregnancy   . GERD (gastroesophageal reflux disease)   . HTN (hypertension)   . TIA (transient ischemic attack)     History reviewed. No pertinent surgical history.  There were no vitals filed for this visit.                OT Treatments/Exercises (OP) - 11/09/19 0001      ADLs   ADL Comments  Read Neuropsych note of 11/30.  Patient able to identify that situation (stroke and limitations) is frustrating him, and also able to verbalize on demand three positve changes noted since home from hospital.  Patient actually became more animated when talking about his accomplishments - rehab progress.  Discussed the importance of focusing on progress versus challenges to help with rehab process.        Neurological Re-education Exercises   Other Exercises 1  Patient does not yet have results off shoulder imaging, so worked on distal upper extremity coordination.  Set up a home exercise program to  address right UE coord.  Patient required cueing and facilitation to avoid compensatory movements in right arm, e.g. shoulder abduction versus flexion for forward reach.  Wife present and able to reinforce concepts of therapy.               OT Education - 11/09/19 1854    Education Details  coordination exercises RUE    Person(s) Educated  Patient;Spouse    Methods  Explanation;Demonstration;Tactile cues;Verbal cues;Handout    Comprehension  Need further instruction;Returned demonstration       OT Short Term Goals - 10/26/19 1644      OT SHORT TERM GOAL #1   Title  Independent with HEP for RUE    Status  On-going      OT SHORT TERM GOAL #2   Title  Pt to report pain less than or equal to 3/10 RUE for functional ADLS    Status  On-going      OT SHORT TERM GOAL #3   Title  Pt to write full sentence with 90% or greater legibility    Status  On-going      OT SHORT TERM GOAL #4   Title  Pt to use Rt dominant hand for eating 50% of the time and grooming 25% of the time  Status  On-going      OT SHORT TERM GOAL #5   Title  Pt to improve coordination Rt hand as evidenced by reducing speed on 9 hole peg test to 65 sec. or under    Status  On-going      OT SHORT TERM GOAL #6   Title  Pt to don/doff shoes and socks mod I level with A/E PRN    Status  On-going      OT SHORT TERM GOAL #7   Title  Pt to improve Rt shoulder flex to 110* in prep for high level reaching    Status  On-going        OT Long Term Goals - 10/21/19 2124      OT LONG TERM GOAL #1   Title  Independent with updated HEP    Time  8    Period  Weeks    Status  New      OT LONG TERM GOAL #2   Title  Pt to use Rt dominant hand for eating 75% of the time or more, and for grooming 50% of the time or more    Time  8    Period  Weeks    Status  New      OT LONG TERM GOAL #3   Title  Pt to improve grip strength Rt hand to 30 lbs or greater    Baseline  14 lbs    Time  8    Period  Weeks    Status   New      OT LONG TERM GOAL #4   Title  Pt to return to light cooking with supervision prn    Time  8    Period  Weeks    Status  New      OT LONG TERM GOAL #5   Title  Pt to improve coordination Rt hand as evidenced by performing 9 hole peg test in 55 sec. or under    Baseline  76.59 sec    Time  8    Period  Weeks    Status  New      Long Term Additional Goals   Additional Long Term Goals  Yes      OT LONG TERM GOAL #6   Title  Pt to perform high level reaching RUE to retrieve/replace light weight objects on high shelf 5/5 trials    Time  8    Period  Weeks    Status  New            Plan - 11/09/19 1854    Clinical Impression Statement  Patient without report of shpulder pain this session, but awaiting shoulder imaging to rule out possible orthpedic concerns.    OT Frequency  2x / week    OT Duration  8 weeks    OT Treatment/Interventions  Self-care/ADL training;Moist Heat;Fluidtherapy;DME and/or AE instruction;Splinting;Aquatic Therapy;Therapeutic activities;Ultrasound;Therapeutic exercise;Cognitive remediation/compensation;Neuromuscular education;Functional Mobility Training;Passive range of motion;Visual/perceptual remediation/compensation;Electrical Stimulation;Paraffin;Manual Therapy;Patient/family education    Plan  NMR RUE/trunk, decrease shoulder pain.  Ask about HEP coord    OT Home Exercise Plan  FM coord    Consulted and Agree with Plan of Care  Patient;Family member/caregiver    Family Member Consulted  wife- [name]       Patient will benefit from skilled therapeutic intervention in order to improve the following deficits and impairments:           Visit Diagnosis: Unsteadiness on feet  Hemiplegia and hemiparesis following cerebral infarction affecting right dominant side (HCC)  Other lack of coordination  Muscle weakness (generalized)  Other symptoms and signs involving cognitive functions following cerebral infarction  Pain in right  arm  Pain in right hand    Problem List Patient Active Problem List   Diagnosis Date Noted  . Dysphagia, post-stroke   . Diabetes mellitus type 2 in obese ([place])   . Acute blood loss anemia   . Hypokalemia   . Diabetic peripheral neuropathy ([place])   . Right knee pain   . Acute ischemic right MCA stroke ([place]) 06/07/2019  . AMS (altered mental status)   . Incidental pulmonary nodule, > 39mm and < 39mm 06/01/2019  . CVA (cerebral vascular accident) ([place]) 05/31/2019  . 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 Onychomycosis 01/11/2015  . Pain in lower limb 01/11/2015  . Hammer toe of right foot 01/02/2015  . Pain in right foot 01/02/2015    Mariah Milling, OTR/L 11/09/2019, 6:57 PM  [facility] [address] [place], [place], [address] Phone: [phone]   Fax:  [phone]  Name: Emit Lykins MRN: [id number] Date of Birth: 10/28/1941

## 2019-11-09 NOTE — Therapy (Signed)
[facility] [address] [place], [place], [address] Phone: [phone]   Fax:  [phone]  Physical Therapy Treatment  Patient Details  Name: Luke Rivera MRN: [id number] Date of Birth: September 13, 1941 Referring Provider (PT): Frann Rider, NP   Encounter Date: 11/09/2019  PT End of Session - 11/09/19 1626    Visit Number  8    Number of Visits  24    Date for PT Re-Evaluation  01/04/20    Authorization Type  Aetna MCR    PT Start Time  8144    PT Stop Time  1615    PT Time Calculation (min)  40 min    Equipment Utilized During Treatment  --   min gaurd to S prn   Activity Tolerance  Patient tolerated treatment well    Behavior During Therapy  [facility] for tasks assessed/performed       Past Medical History:  Diagnosis Date  . CAD (coronary artery disease)   . CHF (congestive heart failure) ([place])   . Chronic anticoagulation   . DM2 (diabetes mellitus, type 2) ([place])   . DVT (deep vein thrombosis) in pregnancy   . GERD (gastroesophageal reflux disease)   . HTN (hypertension)   . TIA (transient ischemic attack)     No past surgical history on file.  There were no vitals filed for this visit.  Subjective Assessment - 11/09/19 1625    Subjective  denies falls, has not been performing exercises like he should at home    Patient is accompained by:  Family member    Patient Stated Goals  get back to where he used to be         [facility] PT Assessment - 11/09/19 0001      Transfers   Five time sit to stand comments   26      Ambulation/Gait   Gait velocity  20 ft in 20 sec=1 ft/sec      Berg Balance Test   Sit to Stand  Able to stand  independently using hands    Standing Unsupported  Able to stand 2 minutes with supervision    Sitting with Back Unsupported but Feet Supported on Floor or Stool  Able to sit 2 minutes under supervision    Stand to Sit  Controls descent by using hands    Transfers  Able to  transfer with verbal cueing and /or supervision    Standing Unsupported with Eyes Closed  Able to stand 10 seconds with supervision    Standing Unsupported with Feet Together  Able to place feet together independently and stand for 1 minute with supervision    From Standing, Reach Forward with Outstretched Arm  Can reach forward >5 cm safely (2")    From Standing Position, Pick up Object from [place] to pick up shoe, needs supervision    From Standing Position, Turn to Look Behind Over each Shoulder  Turn sideways only but maintains balance    Turn 360 Degrees  Needs close supervision or verbal cueing    Standing Unsupported, Alternately Place Feet on Step/Stool  Needs assistance to keep from falling or unable to try    Standing Unsupported, One Foot in [employer] balance while stepping or standing    Standing on One Leg  Unable to try or needs assist to prevent fall    Total Score  28      Timed Up and Go Test   Normal TUG (  seconds)  30    TUG Comments  with RW                   [facility] Adult PT Treatment/Exercise - 11/09/19 0001      Ambulation/Gait   Ambulation/Gait  Yes    Ambulation/Gait Assistance  5: Supervision    Ambulation/Gait Assistance Details  cues for upright posture    Ambulation Distance (Feet)  115 Feet   X2   Gait Pattern  Decreased step length - left;Decreased stance time - right;Decreased hip/knee flexion - right;Decreased dorsiflexion - right;Decreased weight shift to right;Wide base of support;Poor foot clearance - right;Decreased stride length      Exercises   Other Exercises   Sci fit warm up for endurance L5/5 for 5 min UE/LE             PT Education - 11/09/19 1625    Education Details  importance of consistent execise at home to maximize functional results, retesting results today    Person(s) Educated  Patient;Spouse    Methods  Explanation    Comprehension  Verbalized understanding       PT Short Term Goals - 11/09/19 1550       PT SHORT TERM GOAL #1   Title  Pt will be I and compliant with HEP. (STG target date 4 weeks 11/11/19)    Baseline  not consistent yet    Status  On-going      PT SHORT TERM GOAL #2   Title  Pt will improve 5TSTS test to <40 seconds    Baseline  scored 26 seconds when retested on 12/1    Status  Achieved      PT SHORT TERM GOAL #3   Title  Pt will improve TUG to less than 40 seconds    Baseline  improved to 30 sec on 11/09/19    Status  Achieved      PT SHORT TERM GOAL #4   Title  Pt will improve gait speed to >1 ft/sec with LRAD    Baseline  improved to 1 ft/sec on 11/09/19    Status  Achieved        PT Long Term Goals - 10/12/19 1614      PT LONG TERM GOAL #1   Title  Pt will improve TUG to less than 15 seconds LRAD    Time  12    Period  Weeks    Status  New      PT LONG TERM GOAL #2   Title  Pt will improve 5TSTS test to <20 seconds.    Time  12    Period  Weeks    Status  New      PT LONG TERM GOAL #3   Title  Pt will improve gait speed greater than 1.5 ft/sec LRAD    Time  12    Period  Weeks    Status  New      PT LONG TERM GOAL #4   Title  Pt will be able to ambuate at least 750 ft for community ambulaiton mod I with LRAD.    Time  12    Period  Weeks      PT LONG TERM GOAL #5   Title  Pt will be able to negotiate at least one flight of stairs with only one handrail mod I in order to visit with friends/family or have better access to community    Time  12  Period  Weeks    Status  New            Plan - 11/09/19 1627    Clinical Impression Statement  updated measurements and assessed short term goals today. He met 3/4 short term goals. He was able to show overall improvements in gait speed, balance, and leg strength but still has significant deficits and will continue to benefit from skilled PT.    Personal Factors and Comorbidities  Age;Past/Current Experience    Examination-Activity Limitations  Bathing;Transfers;Locomotion Level;Reach  Overhead;Bed Mobility;Bend;Self Feeding;Carry;Squat;Dressing;Stairs;Hygiene/Grooming;Stand;Lift;Toileting    Examination-Participation Restrictions  Meal Prep;Cleaning;Community Activity;Driving;Shop;Laundry;Yard Work    [employer]  Evolving/Moderate complexity    Rehab Potential  Good    PT Frequency  2x / week   2-3   PT Duration  12 weeks    PT Treatment/Interventions  ADLs/Self Care Home Management;Aquatic Therapy;Cryotherapy;English as a second language teacher;Therapeutic activities;Therapeutic exercise;Balance training;Neuromuscular re-education;Manual techniques;Orthotic Fit/Training;Patient/family education;Passive range of motion;Dry needling;Energy conservation;Splinting;Joint Manipulations;Taping    PT Next Visit Plan  Weight shifting (lateral in standing), gait training with R AFO donned and cues to improve hips over foot in midstance vs. flexed posture. Hamstring strength to reduce R genu recurvatum in midstance.    PT Home Exercise Plan  Access Code: [email or web address], added stand hip abd, SL clams, bridge    Consulted and Agree with Plan of Care  Patient       Patient will benefit from skilled therapeutic intervention in order to improve the following deficits and impairments:  Abnormal gait, Decreased activity tolerance, Decreased balance, Decreased coordination, Decreased endurance, Decreased mobility, Decreased range of motion, Decreased strength, Difficulty walking, Hypomobility, Increased fascial restricitons, Increased muscle spasms, Impaired flexibility, Postural dysfunction, Pain  Visit Diagnosis: Other abnormalities of gait and mobility  Unsteadiness on feet  Other lack of coordination  Hemiplegia and hemiparesis following cerebral infarction affecting right dominant side (HCC)  Muscle weakness (generalized)  Other symptoms and signs involving cognitive functions following cerebral infarction     Problem  List Patient Active Problem List   Diagnosis Date Noted  . Dysphagia, post-stroke   . Diabetes mellitus type 2 in obese ([place])   . Acute blood loss anemia   . Hypokalemia   . Diabetic peripheral neuropathy ([place])   . Right knee pain   . Acute ischemic right MCA stroke ([place]) 06/07/2019  . AMS (altered mental status)   . Incidental pulmonary nodule, > 88m and < 827m06/23/2020  . CVA (cerebral vascular accident) ([place]06/22/2020  . 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 Onychomycosis 01/11/2015  . Pain in lower limb 01/11/2015  . Hammer toe of right foot 01/02/2015  . Pain in right foot 01/02/2015    BrDebbe OdeaPT,DPT 11/09/2019, 8:39 PM  [place]one: [phone] Fax:  [phone]: Luke SheelerRN: [id number] of Birth: 02/08/02/1942

## 2019-11-10 NOTE — Therapy (Signed)
[facility] [address] [place], [place], [address] Phone: [phone]   Fax:  [phone]  Speech Language Pathology Treatment  Patient Details  Name: Luke Rivera MRN: [id number] Date of Birth: Oct 27, 1941 Referring Provider (SLP): Frann Rider, NP   Encounter Date: 11/09/2019  End of Session - 11/09/19 1707    Visit Number  7    Number of Visits  17    Date for SLP Re-Evaluation  01/10/20    SLP Start Time  1618    SLP Stop Time   1702    SLP Time Calculation (min)  44 min    Activity Tolerance  Patient tolerated treatment well       Past Medical History:  Diagnosis Date  . CAD (coronary artery disease)   . CHF (congestive heart failure) ([place])   . Chronic anticoagulation   . DM2 (diabetes mellitus, type 2) ([place])   . DVT (deep vein thrombosis) in pregnancy   . GERD (gastroesophageal reflux disease)   . HTN (hypertension)   . TIA (transient ischemic attack)     No past surgical history on file.  There were no vitals filed for this visit.  Subjective Assessment - 11/10/19 0726    Subjective  "We didn't do nothing to celebrate."    Patient is accompained by:  Family member    Currently in Pain?  No/denies            ADULT SLP TREATMENT - 11/10/19 0725      General Information   Behavior/Cognition  Alert;Cooperative;Lethargic      Cognitive-Linquistic Treatment   Treatment focused on  Aphasia;Patient/family/caregiver education    Skilled Treatment  Pt fatigued after PT session. Patient's language was functional with slowed rate in initial conversation. As SLP transitioned to slightly more complex topics (structure of a community service organization pt was a member of, and the history/purpose of the [employer]), listener burden increased, with pt needing occasional cues/interjections from SLP to clarify vague language. Hesitations more pronounced with cognitive  load. When SLP decreased complexity of conversation, pt used circumlocution/substitutions effectively on 3 occasions ("That's what he told me to do" (previous therapist); SLP praised pt for this.      Assessment / Recommendations / Plan   Plan  Continue with current plan of care      Progression Toward Goals   Progression toward goals  Progressing toward goals         SLP Short Term Goals - 11/09/19 1628      SLP SHORT TERM GOAL #1   Title  pt to produce 18/20 functional sentence responses in structured therapy tasks over three sessions    Baseline  11-03-19    Status  Partially Met      SLP SHORT TERM GOAL #2   Title  pt will participate in assessment of reading comprehension and written expression    Status  Achieved      SLP SHORT TERM GOAL #3   Title  pt will participate in 8 minutes simple conversation over three sessions, functionally/with modified independence (compensations for anomia)    Baseline  11-03-19    Status  Partially Met       SLP Long Term Goals - 11/09/19 1628      SLP LONG TERM GOAL #1   Title  pt will participate in 10 minutes simple to mod complex conversation with modified independence, functionally, in 3 sessions    Time  4  Period  Weeks    Status  On-going      SLP LONG TERM GOAL #2   Title  pt will use functional verbal expression in 8 minutes mod complex conversation with rare min A for anomia over three sessions    Time  4    Period  Weeks    Status  On-going       Plan - 11/09/19 1709    Clinical Impression Statement  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  Pt would benefit from cont'd skilled ST targeting verbal expression.    Speech Therapy Frequency  2x / week     Duration  --   8 weeks, or 17 visits   Treatment/Interventions  Functional tasks;Oral motor exercises;Multimodal communcation approach;SLP instruction and feedback;Compensatory strategies;Patient/family education;Internal/external aids;Cueing hierarchy;Language facilitation    Potential to Achieve Goals  Good    Consulted and Agree with Plan of Care  Patient       Patient will benefit from skilled therapeutic intervention in order to improve the following deficits and impairments:   Aphasia    Problem List Patient Active Problem List   Diagnosis Date Noted  . Dysphagia, post-stroke   . Diabetes mellitus type 2 in obese ([place])   . Acute blood loss anemia   . Hypokalemia   . Diabetic peripheral neuropathy ([place])   . Right knee pain   . Acute ischemic right MCA stroke ([place]) 06/07/2019  . AMS (altered mental status)   . Incidental pulmonary nodule, > 53m and < 857m06/23/2020  . CVA (cerebral vascular accident) ([place]06/22/2020  . 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 Onychomycosis 01/11/2015  . Pain in lower limb 01/11/2015  . Hammer toe of right foot 01/02/2015  . Pain in right foot 01/02/2015   MaDeneise LeverMSMooreCCIuka2/01/2019, 7:26 AM  [facility]uCorcoran[place]ilder[place]hone: [phone] Fax:  [phone] Name: Luke PickupRN: [id number] of Birth: 02/1941-07-01

## 2019-11-11 ENCOUNTER — Ambulatory Visit: Payer: Medicare HMO | Admitting: Occupational Therapy

## 2019-11-15 ENCOUNTER — Other Ambulatory Visit: Payer: Self-pay | Admitting: Physical Medicine & Rehabilitation

## 2019-11-16 ENCOUNTER — Ambulatory Visit: Payer: Medicare HMO | Admitting: Occupational Therapy

## 2019-11-16 ENCOUNTER — Other Ambulatory Visit: Payer: Self-pay

## 2019-11-16 ENCOUNTER — Encounter: Payer: Self-pay | Admitting: Occupational Therapy

## 2019-11-16 ENCOUNTER — Ambulatory Visit: Payer: Medicare HMO | Admitting: Physical Therapy

## 2019-11-16 ENCOUNTER — Ambulatory Visit: Payer: Medicare HMO | Admitting: Speech Pathology

## 2019-11-16 DIAGNOSIS — I69351 Hemiplegia and hemiparesis following cerebral infarction affecting right dominant side: Secondary | ICD-10-CM

## 2019-11-16 DIAGNOSIS — R2681 Unsteadiness on feet: Secondary | ICD-10-CM

## 2019-11-16 DIAGNOSIS — I69318 Other symptoms and signs involving cognitive functions following cerebral infarction: Secondary | ICD-10-CM

## 2019-11-16 DIAGNOSIS — M6281 Muscle weakness (generalized): Secondary | ICD-10-CM

## 2019-11-16 DIAGNOSIS — R278 Other lack of coordination: Secondary | ICD-10-CM

## 2019-11-16 DIAGNOSIS — M79601 Pain in right arm: Secondary | ICD-10-CM

## 2019-11-16 DIAGNOSIS — R4701 Aphasia: Secondary | ICD-10-CM

## 2019-11-16 DIAGNOSIS — M79641 Pain in right hand: Secondary | ICD-10-CM

## 2019-11-16 DIAGNOSIS — R2689 Other abnormalities of gait and mobility: Secondary | ICD-10-CM

## 2019-11-16 NOTE — Therapy (Signed)
[facility] [address] [place], [place], [address] Phone: [phone]   Fax:  [phone]  Speech Language Pathology Treatment  Patient Details  Name: Luke Rivera MRN: [id number] Date of Birth: [birth year] Referring Provider (SLP): Frann Rider, NP   Encounter Date: 11/16/2019  End of Session - 11/16/19 1724    Visit Number  8    Number of Visits  17    Date for SLP Re-Evaluation  01/10/20    SLP Start Time  1618    SLP Stop Time   1700    SLP Time Calculation (min)  42 min    Activity Tolerance  Patient tolerated treatment well       Past Medical History:  Diagnosis Date  . CAD (coronary artery disease)   . CHF (congestive heart failure) ([place])   . Chronic anticoagulation   . DM2 (diabetes mellitus, type 2) ([place])   . DVT (deep vein thrombosis) in pregnancy   . GERD (gastroesophageal reflux disease)   . HTN (hypertension)   . TIA (transient ischemic attack)     No past surgical history on file.  There were no vitals filed for this visit.  Subjective Assessment - 11/16/19 1621    Subjective  "I've lived quite a life."    Currently in Pain?  No/denies            ADULT SLP TREATMENT - 11/16/19 1621      General Information   Behavior/Cognition  Alert;Cooperative;Lethargic      Cognitive-Linquistic Treatment   Treatment focused on  Aphasia;Patient/family/caregiver education    Skilled Treatment  Pt again fatigued after PT session. SLP had to request repeats x2 initial conversation due to low vocal intensity + articulatory breakdowns. SLP cued increased volume and modeled slow rate. Subsequent simple conversation was functional. SLP encouraged pt to self-monitor and slow down/repeat if he noted errors; subsequently SLP noted patient to pause after breakdowns and attempt with slower rate x3. Wife reported communication difficulties during billpaying with patient. Patient was frustrated that wife was  not understanding him. SLP encouraged wife to use rephrasing of patient's message to increase patient's awareness of when breakdowns occur.       Assessment / Recommendations / Plan   Plan  Continue with current plan of care      Progression Toward Goals   Progression toward goals  Progressing toward goals       SLP Education - 11/16/19 1723    Education Details  rephrasing, confirming pt's message to create awareness of breakdowns    Person(s) Educated  Patient;Spouse    Methods  Explanation    Comprehension  Verbalized understanding       SLP Short Term Goals - 11/16/19 1725      SLP SHORT TERM GOAL #1   Title  pt to produce 18/20 functional sentence responses in structured therapy tasks over three sessions    Baseline  11-03-19    Status  Partially Met      SLP SHORT TERM GOAL #2   Title  pt will participate in assessment of reading comprehension and written expression    Status  Achieved      SLP SHORT TERM GOAL #3   Title  pt will participate in 8 minutes simple conversation over three sessions, functionally/with modified independence (compensations for anomia)    Baseline  11-03-19    Status  Partially Met       SLP Long Term Goals - 11/16/19  [place] #1   Title  pt will participate in 10 minutes simple to mod complex conversation with modified independence, functionally, in 3 sessions    Baseline  11/16/19    Time  3    Period  Weeks    Status  On-going      SLP LONG TERM GOAL #2   Title  pt will use functional verbal expression in 8 minutes mod complex conversation with rare min A for anomia over three sessions    Time  3    Period  Weeks    Status  On-going       Plan - 11/16/19 1724    Clinical Impression Statement  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  Pt would benefit from cont'd skilled ST targeting verbal expression.    Speech Therapy Frequency  2x / week    Duration  --   8 weeks, or 17 visits   Treatment/Interventions  Functional tasks;Oral motor exercises;Multimodal communcation approach;SLP instruction and feedback;Compensatory strategies;Patient/family education;Internal/external aids;Cueing hierarchy;Language facilitation    Potential to Achieve Goals  Good    Consulted and Agree with Plan of Care  Patient       Patient will benefit from skilled therapeutic intervention in order to improve the following deficits and impairments:   Aphasia    Problem List Patient Active Problem List   Diagnosis Date Noted  . Dysphagia, post-stroke   . Diabetes mellitus type 2 in obese ([place])   . Acute blood loss anemia   . Hypokalemia   . Diabetic peripheral neuropathy ([place])   . Right knee pain   . Acute ischemic right MCA stroke ([place]) 06/07/2019  . AMS (altered mental status)   . Incidental pulmonary nodule, > 60m and < 819m06/23/2020  . CVA (cerebral vascular accident) ([place]06/22/2020  . 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 Onychomycosis 01/11/2015  . Pain in lower limb 01/11/2015  . Hammer toe of right foot 01/02/2015  . Pain in right foot 01/02/2015   MaDeneise LeverMSFreeburnCCLemoyne2/07/2019, 5:25 PM  [id number]: [phone] Fax:  [phone] Name: ThTyce DelcidRN: [id number] of Birth: [birth year]

## 2019-11-16 NOTE — Therapy (Signed)
[facility] [address] [place], [place], [address] Phone: [phone]   Fax:  [phone]  Physical Therapy Treatment  Patient Details  Name: Luke Rivera MRN: [id number] Date of Birth: [birth year] Referring Provider (PT): Frann Rider, NP   Encounter Date: 11/16/2019  PT End of Session - 11/16/19 2118    Visit Number  9    Number of Visits  24    Date for PT Re-Evaluation  01/04/20    Authorization Type  [employer]na MCR    PT Start Time  [id number]    PT Stop Time  1615    PT Time Calculation (min)  40 min    Equipment Utilized During Treatment  --   min gaurd to S prn   Activity Tolerance  Patient tolerated treatment well    Behavior During Therapy  [facility] for tasks assessed/performed       Past Medical History:  Diagnosis Date  . CAD (coronary artery disease)   . CHF (congestive heart failure) ([place])   . Chronic anticoagulation   . DM2 (diabetes mellitus, type 2) ([place])   . DVT (deep vein thrombosis) in pregnancy   . GERD (gastroesophageal reflux disease)   . HTN (hypertension)   . TIA (transient ischemic attack)     No past surgical history on file.  There were no vitals filed for this visit.  Subjective Assessment - 11/16/19 2111    Subjective  no new complaints today        [facility] Adult PT Treatment/Exercise - 11/16/19 2112      Ambulation/Gait   Ambulation/Gait  Yes    Ambulation/Gait Assistance  5: Supervision    Ambulation/Gait Assistance Details  cues to stay closer to walker for posture and safety    Ambulation Distance (Feet)  115 Feet   X3   Assistive device  Rolling walker    Gait Pattern  Decreased step length - left;Decreased stance time - right;Decreased hip/knee flexion - right;Decreased dorsiflexion - right;Decreased weight shift to right;Wide base of support;Poor foot clearance - right;Decreased stride length    Gait Comments  also practiced walking up/down curb X2 reps with CGA and RW, cues  and demo for proper technique then needed cuing for first rep, able to demonstrate without cuing for 2nd rep      High Level Balance   High Level Balance Comments  balance feet together with head turns vertical and horizontal, balance feet together eyes open, balance on foam feet apart progressed to feet together, lateral walking with one UE support up/down length of bars X 3 reps      Exercises   Other Exercises   Sci fit warm up for endurance L5.5 for 5 min UE/LE, walking up/down stairs X 2 with CGA for first rep and SBA 2nd rep at his request (bilat UE support and step to patten             PT Education - 11/16/19 2117    Education Details  technique for negotiating curb with RW    Person(s) Educated  Patient;Spouse    Methods  Explanation;Demonstration;Verbal cues;Handout    Comprehension  Verbalized understanding;Returned demonstration       PT Short Term Goals - 11/09/19 1550      PT SHORT TERM GOAL #1   Title  Pt will be I and compliant with HEP. (STG target date 4 weeks 11/11/19)    Baseline  not consistent yet    Status  On-going  PT SHORT TERM GOAL #2   Title  Pt will improve 5TSTS test to <40 seconds    Baseline  scored 26 seconds when retested on 12/1    Status  Achieved      PT SHORT TERM GOAL #3   Title  Pt will improve TUG to less than 40 seconds    Baseline  improved to 30 sec on 11/09/19    Status  Achieved      PT SHORT TERM GOAL #4   Title  Pt will improve gait speed to >1 ft/sec with LRAD    Baseline  improved to 1 ft/sec on 11/09/19    Status  Achieved        PT Long Term Goals - 10/12/19 1614      PT LONG TERM GOAL #1   Title  Pt will improve TUG to less than 15 seconds LRAD    Time  12    Period  Weeks    Status  New      PT LONG TERM GOAL #2   Title  Pt will improve 5TSTS test to <20 seconds.    Time  12    Period  Weeks    Status  New      PT LONG TERM GOAL #3   Title  Pt will improve gait speed greater than 1.5 ft/sec LRAD     Time  12    Period  Weeks    Status  New      PT LONG TERM GOAL #4   Title  Pt will be able to ambuate at least 750 ft for community ambulaiton mod I with LRAD.    Time  12    Period  Weeks      PT LONG TERM GOAL #5   Title  Pt will be able to negotiate at least one flight of stairs with only one handrail mod I in order to visit with friends/family or have better access to community    Time  [place] - 11/16/19 2118    Clinical Impression Statement  Session focused on gait training with focus on ambulating closer to RW with upright posture and to negotiate stairs and curb today. Remaining session focused on balance activities. He does still need rest breaks due to fatgue. He will need MCR progress note next visit.    Personal Factors and Comorbidities  Age;Past/Current Experience    Examination-Activity Limitations  Bathing;Transfers;Locomotion Level;Reach Overhead;Bed Mobility;Bend;Self Feeding;Carry;Squat;Dressing;Stairs;Hygiene/Grooming;Stand;Lift;Toileting    Examination-Participation Restrictions  Meal Prep;Cleaning;Community Activity;Driving;Shop;Laundry;Yard Work    [employer] officer  Evolving/Moderate complexity    Rehab Potential  Good    PT Frequency  2x / week   2-3   PT Duration  12 weeks    PT Treatment/Interventions  ADLs/Self Care Home Management;Aquatic Therapy;Cryotherapy;English as a second language teacher;Therapeutic activities;Therapeutic exercise;Balance training;Neuromuscular re-education;Manual techniques;Orthotic Fit/Training;Patient/family education;Passive range of motion;Dry needling;Energy conservation;Splinting;Joint Manipulations;Taping    PT Next Visit Plan  Needs Progress note. Weight shifting (lateral in standing), gait training with R AFO donned and cues to improve hips over foot in midstance vs. flexed posture. Hamstring strength to reduce R genu  recurvatum in midstance.    PT Home Exercise Plan  Access Code: [email or web address], added stand hip abd, SL clams, bridge    Consulted and Agree with Plan of Care  Patient  Patient will benefit from skilled therapeutic intervention in order to improve the following deficits and impairments:  Abnormal gait, Decreased activity tolerance, Decreased balance, Decreased coordination, Decreased endurance, Decreased mobility, Decreased range of motion, Decreased strength, Difficulty walking, Hypomobility, Increased fascial restricitons, Increased muscle spasms, Impaired flexibility, Postural dysfunction, Pain  Visit Diagnosis: Hemiplegia and hemiparesis following cerebral infarction affecting right dominant side (HCC)  Unsteadiness on feet  Other lack of coordination  Muscle weakness (generalized)  Pain in right arm  Other abnormalities of gait and mobility     Problem List Patient Active Problem List   Diagnosis Date Noted  . Dysphagia, post-stroke   . Diabetes mellitus type 2 in obese ([place])   . Acute blood loss anemia   . Hypokalemia   . Diabetic peripheral neuropathy ([place])   . Right knee pain   . Acute ischemic right MCA stroke ([place]) 06/07/2019  . AMS (altered mental status)   . Incidental pulmonary nodule, > 80mm and < 12mm 06/01/2019  . CVA (cerebral vascular accident) ([place]) 05/31/2019  . 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 Onychomycosis 01/11/2015  . Pain in lower limb 01/11/2015  . Hammer toe of right foot 01/02/2015  . Pain in right foot 01/02/2015    Silvestre Mesi 11/16/2019, 9:22 PM  [facility] [address] [place], [place], [address] Phone: [phone]   Fax:  [phone]  Name: Luke Rivera MRN: [id number] Date of Birth: 20-Oct-1941

## 2019-11-16 NOTE — Therapy (Signed)
[facility] [address] [place], [place], [address] Phone: [phone]   Fax:  [phone]  Occupational Therapy Treatment  Patient Details  Name: Luke Rivera MRN: [id number] Date of Birth: 1941/08/22 Referring Provider (OT): Frann Rider   Encounter Date: 11/16/2019  OT End of Session - 11/16/19 1809    Visit Number  6    Number of Visits  17    Date for OT Re-Evaluation  12/21/19    Authorization Type  Aetna MCR    Authorization - Visit Number  6    Authorization - Number of Visits  10    OT Start Time  1700    OT Stop Time  1745    OT Time Calculation (min)  45 min    Activity Tolerance  Patient tolerated treatment well    Behavior During Therapy  [facility] for tasks assessed/performed       Past Medical History:  Diagnosis Date  . CAD (coronary artery disease)   . CHF (congestive heart failure) ([place])   . Chronic anticoagulation   . DM2 (diabetes mellitus, type 2) ([place])   . DVT (deep vein thrombosis) in pregnancy   . GERD (gastroesophageal reflux disease)   . HTN (hypertension)   . TIA (transient ischemic attack)     History reviewed. No pertinent surgical history.  There were no vitals filed for this visit.  Subjective Assessment - 11/16/19 1708    Subjective   I have a routine that I do because I was told to do it    Currently in Pain?  No/denies    Pain Score  0-No pain                   OT Treatments/Exercises (OP) - 11/16/19 0001      ADLs   LB Dressing  Worked on functional use of RUE for assisting with tying shoes.  Patient is showing improved functional pinch and low reach, so encouraged him to continue to tie shoes with BUE at home.        Neurological Re-education Exercises   Other Exercises 1  Patient's wife indicates [facility].  No signs of fracture per wife - signs of arthritis.  Patient indicates that shot in shoulder helped for short period  of time, wants recommendation for topical rub - Voltarin has not been effective per patient, nor has biofreeze, or blue emu.  Patient did respond well to heat in hand - to reduce stiffness in digits.  At end of session, patient able to make contact with pad of index finger to palm of hand..      Other Exercises 2  Patient has been given resistive weight exercises which he has been doing faithfully.  Patient with significant shoulder pain, and did finally agree to stop doing resistive exercises to allow shoulder pain to subside.  Will need to work on gentle rang of motion - hopefully can return to supine exercises to promote moility without discomfort.  Patient's wife present and she also understood importance of stopping weight training for RUE.  Patient continues to walk with a fair amount of flexion in hips.  Discussed the importance of llowing legs to carry body weight and reducing stress in arms for mobility.  Patient understood, but indicates that is difficult when he is fatigued.        Modalities   Modalities  Fluidotherapy      RUE Fluidotherapy  Number Minutes Fluidotherapy  5 Minutes    RUE Fluidotherapy Location  Hand;Wrist    Comments  reduce pain and stffness             OT Education - 11/16/19 1809    Education Details  stop resistive weight training for UE    Person(s) Educated  Patient;Spouse    Methods  Explanation    Comprehension  Verbalized understanding       OT Short Term Goals - 10/26/19 1644      OT SHORT TERM GOAL #1   Title  Independent with HEP for RUE    Status  On-going      OT SHORT TERM GOAL #2   Title  Pt to report pain less than or equal to 3/10 RUE for functional ADLS    Status  On-going      OT SHORT TERM GOAL #3   Title  Pt to write full sentence with 90% or greater legibility    Status  On-going      OT SHORT TERM GOAL #4   Title  Pt to use Rt dominant hand for eating 50% of the time and grooming 25% of the time    Status  On-going       OT SHORT TERM GOAL #5   Title  Pt to improve coordination Rt hand as evidenced by reducing speed on 9 hole peg test to 65 sec. or under    Status  On-going      OT SHORT TERM GOAL #6   Title  Pt to don/doff shoes and socks mod I level with A/E PRN    Status  On-going      OT SHORT TERM GOAL #7   Title  Pt to improve Rt shoulder flex to 110* in prep for high level reaching    Status  On-going        OT Long Term Goals - 10/21/19 2124      OT LONG TERM GOAL #1   Title  Independent with updated HEP    Time  8    Period  Weeks    Status  New      OT LONG TERM GOAL #2   Title  Pt to use Rt dominant hand for eating 75% of the time or more, and for grooming 50% of the time or more    Time  8    Period  Weeks    Status  New      OT LONG TERM GOAL #3   Title  Pt to improve grip strength Rt hand to 30 lbs or greater    Baseline  14 lbs    Time  8    Period  Weeks    Status  New      OT LONG TERM GOAL #4   Title  Pt to return to light cooking with supervision prn    Time  8    Period  Weeks    Status  New      OT LONG TERM GOAL #5   Title  Pt to improve coordination Rt hand as evidenced by performing 9 hole peg test in 55 sec. or under    Baseline  76.59 sec    Time  8    Period  Weeks    Status  New      Long Term Additional Goals   Additional Long Term Goals  Yes      OT LONG TERM  GOAL #6   Title  Pt to perform high level reaching RUE to retrieve/replace light weight objects on high shelf 5/5 trials    Time  8    Period  Weeks    Status  New            Plan - 11/16/19 1810    Clinical Impression Statement  Patient without fracture in right shoulder - only found arthritic changes on recent xray.  Patient with improving distal RUE mobility and function.    OT Frequency  2x / week    OT Duration  8 weeks    OT Treatment/Interventions  Self-care/ADL training;Moist Heat;Fluidtherapy;DME and/or AE instruction;Splinting;Aquatic Therapy;Therapeutic  activities;Ultrasound;Therapeutic exercise;Cognitive remediation/compensation;Neuromuscular education;Functional Mobility Training;Passive range of motion;Visual/perceptual remediation/compensation;Electrical Stimulation;Paraffin;Manual Therapy;Patient/family education    Plan  NMR RUE/trunk, decrease shoulder pain.  Ask about HEP coord    Consulted and Agree with Plan of Care  Patient;Family member/caregiver    Family Member Consulted  wife- [name]       Patient will benefit from skilled therapeutic intervention in order to improve the following deficits and impairments:           Visit Diagnosis: Hemiplegia and hemiparesis following cerebral infarction affecting right dominant side (HCC)  Unsteadiness on feet  Other lack of coordination  Muscle weakness (generalized)  Other symptoms and signs involving cognitive functions following cerebral infarction  Pain in right arm  Pain in right hand    Problem List Patient Active Problem List   Diagnosis Date Noted  . Dysphagia, post-stroke   . Diabetes mellitus type 2 in obese ([place])   . Acute blood loss anemia   . Hypokalemia   . Diabetic peripheral neuropathy ([place])   . Right knee pain   . Acute ischemic right MCA stroke ([place]) 06/07/2019  . AMS (altered mental status)   . Incidental pulmonary nodule, > 14mm and < 75mm 06/01/2019  . CVA (cerebral vascular accident) ([place]) 05/31/2019  . 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 Onychomycosis 01/11/2015  . Pain in lower limb 01/11/2015  . Hammer toe of right foot 01/02/2015  . Pain in right foot 01/02/2015    Mariah Milling, OTR/L 11/16/2019, 6:12 PM  [facility] [address] [place], [place], [address] Phone: [phone]   Fax:  [phone]  Name: Traegan Chargualaf MRN: [id number] Date of Birth: 28-Jul-1941

## 2019-11-18 ENCOUNTER — Encounter: Payer: Self-pay | Admitting: Physical Medicine & Rehabilitation

## 2019-11-18 ENCOUNTER — Encounter: Payer: Medicare HMO | Attending: Physical Medicine & Rehabilitation | Admitting: Physical Medicine & Rehabilitation

## 2019-11-18 ENCOUNTER — Other Ambulatory Visit: Payer: Self-pay

## 2019-11-18 VITALS — BP 136/73 | HR 73 | Temp 97.5°F | Resp 14 | Ht 75.0 in | Wt 227.0 lb

## 2019-11-18 DIAGNOSIS — M7501 Adhesive capsulitis of right shoulder: Secondary | ICD-10-CM | POA: Diagnosis not present

## 2019-11-18 DIAGNOSIS — I69920 Aphasia following unspecified cerebrovascular disease: Secondary | ICD-10-CM | POA: Diagnosis not present

## 2019-11-18 DIAGNOSIS — I69351 Hemiplegia and hemiparesis following cerebral infarction affecting right dominant side: Secondary | ICD-10-CM

## 2019-11-18 DIAGNOSIS — F4323 Adjustment disorder with mixed anxiety and depressed mood: Secondary | ICD-10-CM | POA: Diagnosis not present

## 2019-11-18 NOTE — Patient Instructions (Signed)
TRY BIOFREEZE TO RIGHT SHOULDER AFTER HEATING PAD  MAY TRY TYLENOL 650MG  3 TIMES A DAY

## 2019-11-18 NOTE — Progress Notes (Signed)
Subjective:    Patient ID: Luke Rivera, male    DOB: 07-18-1941, 78 y.o.   MRN: [id number] 78 year old male with history of CAD and chronic atrial fibrillation as well as type 2 diabetes with peripheral neuropathy who was admitted to [facility] on 05/31/2019 with right-sided weakness and speech problems.  His CT showed left MCA distribution infarct.  TCD negative for PFO.  Eliquis dose was increased to 5 mg twice daily.   HPI Right hand and shoulder pain , 2 day relief with medrol dose pack  The patient has been to the [place] and x-rays were taken.  He does have a CD with him however there is no CD reader on laptop.  Per wife's report they saw some arthritis in the shoulder but not in the hand. The patient feels like his hand is mainly stiff and is not hypersensitive to touch.  There is been no redness. His shoulder does cause some pain with range of motion.  They have tried some Voltaren gel but has not been very helpful.  He asked for other potential over-the-counter treatments. He and his wife have followed up with Dr. Sima Matas and have been very happy with the visit. We also discussed that since that are 6 months or more post stroke will have a plateau in their recovery.  Additional improvement will be less pronounced than in the initial months post stroke Pain Inventory Average Pain 5 Pain Right Now 4 My pain is intermittent  In the last 24 hours, has pain interfered with the following? General activity 0 Relation with others 0 Enjoyment of life 0 What TIME of day is your pain at its worst? varies Sleep (in general) Good  Pain is worse with: bending Pain improves with: heat/ice and therapy/exercise Relief from Meds: 0  Mobility walk without assistance ability to climb steps?  yes do you drive?  no use a wheelchair needs help with transfers  Function retired  Neuro/Psych numbness  Prior Studies Any changes since last visit?  no  Physicians involved in your care Any  changes since last visit?  no   Family History  Problem Relation Age of Onset  . Hypertension Father   . Heart disease Mother   . Heart attack Brother    Social History   Socioeconomic History  . Marital status: Married    Spouse name: Not on file  . Number of children: Not on file  . Years of education: Not on file  . Highest education level: Not on file  Occupational History  . Not on file  Tobacco Use  . Smoking status: Former Research scientist (life sciences)  . Smokeless tobacco: Never Used  Substance and Sexual Activity  . Alcohol use: Never    Alcohol/week: 0.0 standard drinks  . Drug use: Never  . Sexual activity: Not on file  Other Topics Concern  . Not on file  Social History Narrative  . Not on file   Social Determinants of Health   Financial Resource Strain:   . Difficulty of Paying Living Expenses: Not on file  Food Insecurity:   . Worried About Charity fundraiser in the Last Year: Not on file  . Ran Out of Food in the Last Year: Not on file  Transportation Needs:   . Lack of Transportation (Medical): Not on file  . Lack of Transportation (Non-Medical): Not on file  Physical Activity:   . Days of Exercise per Week: Not on file  . Minutes of Exercise per Session:  Not on file  Stress:   . Feeling of Stress : Not on file  Social Connections:   . Frequency of Communication with Friends and Family: Not on file  . Frequency of Social Gatherings with Friends and Family: Not on file  . Attends Religious Services: Not on file  . Active Member of Clubs or Organizations: Not on file  . Attends Archivist Meetings: Not on file  . Marital Status: Not on file   History reviewed. No pertinent surgical history. Past Medical History:  Diagnosis Date  . CAD (coronary artery disease)   . CHF (congestive heart failure) ([place])   . Chronic anticoagulation   . DM2 (diabetes mellitus, type 2) ([place])   . DVT (deep vein thrombosis) in pregnancy   . GERD (gastroesophageal reflux  disease)   . HTN (hypertension)   . TIA (transient ischemic attack)    BP 136/73   Pulse 73   Temp (!) 97.5 F (36.4 C)   Resp 14   Ht 6\' 3"  (1.905 m)   Wt 227 lb (103 kg)   SpO2 95%   BMI 28.37 kg/m   Opioid Risk Score:   Fall Risk Score:  `1  Depression screen PHQ 2/9  Depression screen [facility] 2/9 10/07/2019 08/16/2019  Decreased Interest 0 0  Down, Depressed, Hopeless 0 0  PHQ - 2 Score 0 0    Review of Systems  Constitutional: Negative.   HENT: Negative.   Eyes: Negative.   Respiratory: Negative.   Cardiovascular: Negative.   Gastrointestinal: Negative.   Endocrine: Negative.   Genitourinary: Negative.   Musculoskeletal: Positive for arthralgias and gait problem.  Skin: Negative.   Allergic/Immunologic: Negative.   Neurological: Positive for numbness.  Hematological: Negative.   Psychiatric/Behavioral: Negative.   All other systems reviewed and are negative.      Objective:   Physical Exam Vitals and nursing note reviewed.  Constitutional:      Appearance: Normal appearance.  Eyes:     Extraocular Movements: Extraocular movements intact.     Conjunctiva/sclera: Conjunctivae normal.     Pupils: Pupils are equal, round, and reactive to light.  Neurological:     Mental Status: He is alert.    Right shoulder has pain with external rotation he has limited external rotation to just past neutral.  Shoulder abduction is to 90 degrees.  Patient also has pain with crossed adduction.. There is no evidence of shoulder subluxation. Right hand without swelling no erythema no skin color changes or hyperhidrosis/hyperhidrosis.  There is no hypersensitivity to touch. There is no pain to palpation over the MCPs or PIPs. Patient does have limited PIP MCP and DIP flexion he gets to about 75% of normal range.  Line motor strength is 4/5 in the right deltoid bicep tricep grip as well as hip flexor knee extensor 3 - at the ankle dorsiflexor Left upper extremities 5/5 left lower  extremity 5/5 Ambulates with a walker he has external rotation at the hip he does clear his foot with a right AFO.  His cadence is slow he is increased base of support.       Assessment & Plan:  #1.  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  In addition he has residual right hemiparesis as well as gait disorder.  He did have some right lower extremity weakness from prior stroke and according to his wife  walked with his foot rotated outward since his prior stroke. Continue outpatient  PT OT Physical medicine rehab visit in 3 months.  If he needs to be seen sooner a WebEx visit can be performed due to his Covid complication risk.  We discussed that if the shoulder pain does not improve with physical therapy as well as Tylenol 650 mg 3 times daily and Biofreeze gel that other more invasive procedures may be helpful.

## 2019-11-19 ENCOUNTER — Encounter: Payer: Self-pay | Admitting: Occupational Therapy

## 2019-11-19 ENCOUNTER — Ambulatory Visit: Payer: Medicare HMO | Admitting: Occupational Therapy

## 2019-11-19 ENCOUNTER — Ambulatory Visit: Payer: Medicare HMO | Admitting: Physical Therapy

## 2019-11-19 ENCOUNTER — Ambulatory Visit: Payer: Medicare HMO

## 2019-11-19 ENCOUNTER — Encounter: Payer: Self-pay | Admitting: Physical Therapy

## 2019-11-19 DIAGNOSIS — M79601 Pain in right arm: Secondary | ICD-10-CM

## 2019-11-19 DIAGNOSIS — R2681 Unsteadiness on feet: Secondary | ICD-10-CM

## 2019-11-19 DIAGNOSIS — M6281 Muscle weakness (generalized): Secondary | ICD-10-CM

## 2019-11-19 DIAGNOSIS — R4701 Aphasia: Secondary | ICD-10-CM

## 2019-11-19 DIAGNOSIS — R278 Other lack of coordination: Secondary | ICD-10-CM

## 2019-11-19 DIAGNOSIS — M79641 Pain in right hand: Secondary | ICD-10-CM

## 2019-11-19 DIAGNOSIS — I69351 Hemiplegia and hemiparesis following cerebral infarction affecting right dominant side: Secondary | ICD-10-CM

## 2019-11-19 DIAGNOSIS — I69318 Other symptoms and signs involving cognitive functions following cerebral infarction: Secondary | ICD-10-CM

## 2019-11-19 NOTE — Patient Instructions (Signed)
Active assisted range   In sitting - with a can beside your right leg - press forward and lean forward, and back.  , press right and lean right and back to center, complete circle 10 reps in each direction. \Ok to do several times / day.

## 2019-11-19 NOTE — Therapy (Signed)
[facility] [address] [place], [place], [address] Phone: [phone]   Fax:  [phone]  Occupational Therapy Treatment  Patient Details  Name: Luke Rivera MRN: [id number] Date of Birth: 11/27/1941 Referring Provider (OT): Frann Rider   Encounter Date: 11/19/2019  OT End of Session - 11/19/19 1643    Visit Number  7    Number of Visits  17    Date for OT Re-Evaluation  12/21/19    Authorization Type  [employer] MCR    Authorization - Visit Number  7    Authorization - Number of Visits  10    OT Start Time  [id number]    OT Stop Time  1617    OT Time Calculation (min)  44 min    Activity Tolerance  Patient tolerated treatment well    Behavior During Therapy  [facility] for tasks assessed/performed       Past Medical History:  Diagnosis Date  . CAD (coronary artery disease)   . CHF (congestive heart failure) ([place])   . Chronic anticoagulation   . DM2 (diabetes mellitus, type 2) ([place])   . DVT (deep vein thrombosis) in pregnancy   . GERD (gastroesophageal reflux disease)   . HTN (hypertension)   . TIA (transient ischemic attack)     History reviewed. No pertinent surgical history.  There were no vitals filed for this visit.  Subjective Assessment - 11/19/19 1636    Subjective   I have been working on it.  (Patient with improved composite flexion in right hand today)    Currently in Pain?  Yes    Pain Score  7     Pain Location  Shoulder    Pain Orientation  Right    Pain Descriptors / Indicators  Aching    Pain Onset  More than a month ago    Pain Frequency  Intermittent    Aggravating Factors   movement, poor positioning    Pain Relieving Factors  tylenol, topical medicinal rubs                   OT Treatments/Exercises (OP) - 11/19/19 0001      Neurological Re-education Exercises   Other Exercises 1  Still working to establish a home exercise program that will provide gentle motion without  causing pain.  Patient with pain with minimla movement in supine.  Patient is mid to distal clavicle - anterior shoulder. In sitting though, patient able to complete active motion on moveable surface , eg. tipping a chair, resting hand on cane for assisted low to mid reach pattern.  Patient even able to add body and arm motion together with leaning into movement - without report of pain.  Patient needed cueing and facilitation to first focus on midline alignment and posture prior to exercising right shoulder.        Moist Heat Therapy   Number Minutes Moist Heat  10 Minutes    Moist Heat Location  Shoulder             OT Education - 11/19/19 1642    Education Details  guided motion on moveable surface, benefit of moist heat prior to exercise    Person(s) Educated  Patient;Spouse    Methods  Explanation;Demonstration;Tactile cues;Verbal cues;Handout    Comprehension  Verbalized understanding;Returned demonstration;Need further instruction       OT Short Term Goals - 11/19/19 1645      OT SHORT TERM GOAL #1  Title  Independent with HEP for RUE    Status  Achieved      OT SHORT TERM GOAL #2   Title  Pt to report pain less than or equal to 3/10 RUE for functional ADLS    Status  On-going      OT SHORT TERM GOAL #3   Title  Pt to write full sentence with 90% or greater legibility    Status  On-going      OT SHORT TERM GOAL #4   Title  Pt to use Rt dominant hand for eating 50% of the time and grooming 25% of the time    Status  On-going      OT SHORT TERM GOAL #5   Title  Pt to improve coordination Rt hand as evidenced by reducing speed on 9 hole peg test to 65 sec. or under    Status  On-going      OT SHORT TERM GOAL #6   Title  Pt to don/doff shoes and socks mod I level with A/E PRN    Status  Deferred   Patient wears compression socks, and AFO which he is unable to put on without help     OT SHORT TERM GOAL #7   Title  Pt to improve Rt shoulder flex to 110* in prep for  high level reaching    Status  On-going        OT Long Term Goals - 10/21/19 2124      OT LONG TERM GOAL #1   Title  Independent with updated HEP    Time  8    Period  Weeks    Status  New      OT LONG TERM GOAL #2   Title  Pt to use Rt dominant hand for eating 75% of the time or more, and for grooming 50% of the time or more    Time  8    Period  Weeks    Status  New      OT LONG TERM GOAL #3   Title  Pt to improve grip strength Rt hand to 30 lbs or greater    Baseline  14 lbs    Time  8    Period  Weeks    Status  New      OT LONG TERM GOAL #4   Title  Pt to return to light cooking with supervision prn    Time  8    Period  Weeks    Status  New      OT LONG TERM GOAL #5   Title  Pt to improve coordination Rt hand as evidenced by performing 9 hole peg test in 55 sec. or under    Baseline  76.59 sec    Time  8    Period  Weeks    Status  New      Long Term Additional Goals   Additional Long Term Goals  Yes      OT LONG TERM GOAL #6   Title  Pt to perform high level reaching RUE to retrieve/replace light weight objects on high shelf 5/5 trials    Time  8    Period  Weeks    Status  New            Plan - 11/19/19 1643    Clinical Impression Statement  Patient with continued pain in right shoulder, although pain in right hand seems to be subsiding.  Patient  with improved active motion in right hand.    OT Frequency  2x / week    OT Duration  8 weeks    OT Treatment/Interventions  Self-care/ADL training;Moist Heat;Fluidtherapy;DME and/or AE instruction;Splinting;Aquatic Therapy;Therapeutic activities;Ultrasound;Therapeutic exercise;Cognitive remediation/compensation;Neuromuscular education;Functional Mobility Training;Passive range of motion;Visual/perceptual remediation/compensation;Electrical Stimulation;Paraffin;Manual Therapy;Patient/family education    Plan  NMR RUE/trunk, decrease shoulder pain.  Ask about HEP coord    OT Home Exercise Plan  FM coord,  gentle shoulder motion on moveable surface in sitting    Consulted and Agree with Plan of Care  Patient;Family member/caregiver    Family Member Consulted  wife- [name]       Patient will benefit from skilled therapeutic intervention in order to improve the following deficits and impairments:           Visit Diagnosis: Hemiplegia and hemiparesis following cerebral infarction affecting right dominant side (HCC)  Unsteadiness on feet  Muscle weakness (generalized)  Other lack of coordination  Pain in right arm  Pain in right hand  Other symptoms and signs involving cognitive functions following cerebral infarction    Problem List Patient Active Problem List   Diagnosis Date Noted  . Dysphagia, post-stroke   . Diabetes mellitus type 2 in obese ([place])   . Acute blood loss anemia   . Hypokalemia   . Diabetic peripheral neuropathy ([place])   . Right knee pain   . Acute ischemic right MCA stroke ([place]) 06/07/2019  . AMS (altered mental status)   . Incidental pulmonary nodule, > 45mm and < 76mm 06/01/2019  . CVA (cerebral vascular accident) ([place]) 05/31/2019  . 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 Onychomycosis 01/11/2015  . Pain in lower limb 01/11/2015  . Hammer toe of right foot 01/02/2015  . Pain in right foot 01/02/2015    Mariah Milling, OTR/L 11/19/2019, 4:47 PM  [facility] [address] [place], [place], [address] Phone: [phone]   Fax:  [phone]  Name: Luke Rivera MRN: [id number] Date of Birth: [birth year]

## 2019-11-19 NOTE — Therapy (Signed)
[facility] [address] [place], [place], [address] Phone: [phone]   Fax:  [phone]  Speech Language Pathology Treatment  Patient Details  Name: Luke Rivera MRN: [id number] Date of Birth: [birth year] Referring Provider (SLP): Frann Rider, NP   Encounter Date: 11/19/2019  End of Session - 11/19/19 1553    Visit Number  9    Number of Visits  17    Date for SLP Re-Evaluation  01/10/20    SLP Start Time  1450    SLP Stop Time   1530    SLP Time Calculation (min)  40 min    Activity Tolerance  Patient tolerated treatment well       Past Medical History:  Diagnosis Date  . CAD (coronary artery disease)   . CHF (congestive heart failure) ([place])   . Chronic anticoagulation   . DM2 (diabetes mellitus, type 2) ([place])   . DVT (deep vein thrombosis) in pregnancy   . GERD (gastroesophageal reflux disease)   . HTN (hypertension)   . TIA (transient ischemic attack)     History reviewed. No pertinent surgical history.  There were no vitals filed for this visit.  Subjective Assessment - 11/19/19 1454    Subjective  "Told her a bunch of bull*h*t stories." pt, re: what pt did last session with SLP Camie Patience.   Patient is accompained by:  --   wife   Currently in Pain?  No/denies            ADULT SLP TREATMENT - 11/19/19 1455      General Information   Behavior/Cognition  Alert;Cooperative;Lethargic      Treatment Provided   Treatment provided  Cognitive-Linquistic      Cognitive-Linquistic Treatment   Treatment focused on  Aphasia;Patient/family/caregiver education    Skilled Treatment  Pt lethargic when arriving to [place] today. SLP educated pt and wife on ways to faciliatate/encourage successes with speech/language (more important conversations when pt is more alert/awake and not later in the day, etc). SLP encouraged pt to use some fraction of his phone calls (someone calling him) for practice with verbal  expression. SLP addressed that dyad conversations will be better for pt right now than triad or more conversations due to pt's need for extra time. Pt has 3-4 people who call him during the week - SLP encouraged pt to use these strategies with them. SLP praised pt for talking more in complete sentences than using telegraphic speech. Pt admitted that he talks more in telegraphic speech at home - SLP communicated to pt that tallking in complete sentences allows him more practice in use of proper langauge. Mod complex conversation at the end of the session re: sports was functional and with extra time.       Assessment / Recommendations / Plan   Plan  Continue with current plan of care      Progression Toward Goals   Progression toward goals  Progressing toward goals       SLP Education - 11/19/19 1552    Education Details  prognosis for further recovery of language ("Better than it is today", "more you practice the better it will be"), take a portion of each conversation and use it to practice language skills    Person(s) Educated  Patient;Spouse    Methods  Explanation    Comprehension  Verbalized understanding       SLP Short Term Goals - 11/16/19 1725      SLP SHORT TERM  GOAL #1   Title  pt to produce 18/20 functional sentence responses in structured therapy tasks over three sessions    Baseline  11-03-19    Status  Partially Met      SLP SHORT TERM GOAL #2   Title  pt will participate in assessment of reading comprehension and written expression    Status  Achieved      SLP SHORT TERM GOAL #3   Title  pt will participate in 8 minutes simple conversation over three sessions, functionally/with modified independence (compensations for anomia)    Baseline  11-03-19    Status  Partially Met       SLP Long Term Goals - 11/19/19 1554      SLP LONG TERM GOAL #1   Title  pt will participate in 10 minutes simple to mod complex conversation with modified independence, functionally, in 3  sessions    Baseline  11/16/19, 11-19-19    Time  3    Period  Weeks    Status  On-going      SLP LONG TERM GOAL #2   Title  pt will use functional verbal expression in 8 minutes mod complex conversation with rare min A for anomia over three sessions    Baseline  11-19-19    Time  3    Period  Weeks    Status  On-going       Plan - 11/19/19 1553    Clinical Impression Statement  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 Pt again functional in his verbal expression in mod complex topics with extra time. Pt/wife cont to prefer to focus on verbal communication with ST at this time rather than add reading/writing skills.  Pt would benefit from cont'd skilled ST targeting verbal expression.    Speech Therapy Frequency  2x / week    Duration  --   8 weeks, or 17 visits   Treatment/Interventions  Functional tasks;Oral motor exercises;Multimodal communcation approach;SLP instruction and feedback;Compensatory strategies;Patient/family education;Internal/external aids;Cueing hierarchy;Language facilitation    Potential to Achieve Goals  Good    Consulted and Agree with Plan of Care  Patient       Patient will benefit from skilled therapeutic intervention in order to improve the following deficits and impairments:   Aphasia    Problem List Patient Active Problem List   Diagnosis Date Noted  . Dysphagia, post-stroke   . Diabetes mellitus type 2 in obese ([place])   . Acute blood loss anemia   . Hypokalemia   . Diabetic peripheral neuropathy ([place])   . Right knee pain   . Acute ischemic right MCA stroke ([place]) 06/07/2019  . AMS (altered mental status)   . Incidental pulmonary nodule, > 49m and < 835m06/23/2020  . CVA (cerebral vascular accident) ([place]06/22/2020  . 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 Onychomycosis 01/11/2015  . Pain in lower limb 01/11/2015  . Hammer toe of right foot 01/02/2015  . Pain in right foot 01/02/2015     SCAtl[facility]ology EndoscopyMS, CCC-SLP  11/19/2019, 3:55 PM  [place] [place]one: [phone] Fax:  [phone] Name: ThStephen BaruchRN: [id number] of Birth: 3/November 19, 1941

## 2019-11-19 NOTE — Therapy (Signed)
[facility] [address] [place], [place], [address] Phone: [phone]   Fax:  [phone]  Physical Therapy Treatment/10th Visit Progress Note  Patient Details  Name: Luke Rivera MRN: [id number] Date of Birth: [birth year] Referring Provider (PT): Frann Rider, NP  Progress Note Reporting Period 10/12/19 to 11/19/19   See note below for Objective Data and Assessment of Progress/Goals.    Encounter Date: 11/19/2019  PT End of Session - 11/19/19 1451    Visit Number  10    Number of Visits  24    Date for PT Re-Evaluation  01/04/20    Authorization Type  Aetna MCR    PT Start Time  1400    PT Stop Time  1445    PT Time Calculation (min)  45 min    Equipment Utilized During Treatment  Gait belt    Activity Tolerance  Patient tolerated treatment well    Behavior During Therapy  WFL for tasks assessed/performed       Past Medical History:  Diagnosis Date  . CAD (coronary artery disease)   . CHF (congestive heart failure) ([place])   . Chronic anticoagulation   . DM2 (diabetes mellitus, type 2) ([place])   . DVT (deep vein thrombosis) in pregnancy   . GERD (gastroesophageal reflux disease)   . HTN (hypertension)   . TIA (transient ischemic attack)     History reviewed. No pertinent surgical history.  There were no vitals filed for this visit.  Subjective Assessment - 11/19/19 1404    Subjective  No new complaints. Exercises are going well at home.    Patient is accompained by:  Family member    How long can you stand comfortably?  10 min    How long can you walk comfortably?  10 min    Patient Stated Goals  get back to where he used to be    Currently in Pain?  No/denies                       [facility] Adult PT Treatment/Exercise - 11/19/19 0001      Ambulation/Gait   Ambulation/Gait  Yes    Ambulation/Gait Assistance  5: Supervision    Ambulation/Gait Assistance Details  cues for upright  posture during gait and to keep RW close especially during turns to sit back down     Ambulation Distance (Feet)  115 Feet   approx throughout session   Assistive device  Rolling walker    Gait Pattern  Decreased step length - left;Decreased stance time - right;Decreased hip/knee flexion - right;Decreased dorsiflexion - right;Decreased weight shift to right;Wide base of support;Poor foot clearance - right;Decreased stride length      Neuro Re-ed    Neuro Re-ed Details   Standing at countertop with BUE support at sink: wide BOS lateral weight shifting 2 x 10 reps with therapist's manual assistance to hold weight shift towards R. 2 x 10 reps mini squats - needs verbal cues for slowed and controlled movement, min guard from therapist for safety. Cues for posture throughout. Seated rest breaks in between exercises. Backward walking with BUE support in // bars: use of mirror for cues for posture to look upright, cues for longer step length, as pt with tendency to have decreased step length on LLE. With single UE support in // bars: step taps with LLE onto 4" step for weight shifting towards RLE, 2 x 10 reps.  With 1" black foam  on floor - stepping over with LLE with focus on stance phase on RLE, pt demonstrates increased genu recurvatum even with tactile cues from therapist.       Exercises   Other Exercises   Supine modified Aleksander test hip flexor stretch off edge of mat for RLE due to increased forward flexed posture, therapist assisting holding RLE and then placing it on a block due to increased tightness - 3 x 45 seconds. Standing marching at countertop with BUE support 1 x 10 reps, side stepping 1 x 10 reps B.               PT Short Term Goals - 11/09/19 1550      PT SHORT TERM GOAL #1   Title  Pt will be I and compliant with HEP. (STG target date 4 weeks 11/11/19)    Baseline  not consistent yet    Status  On-going      PT SHORT TERM GOAL #2   Title  Pt will improve 5TSTS test to <40  seconds    Baseline  scored 26 seconds when retested on 12/1    Status  Achieved      PT SHORT TERM GOAL #3   Title  Pt will improve TUG to less than 40 seconds    Baseline  improved to 30 sec on 11/09/19    Status  Achieved      PT SHORT TERM GOAL #4   Title  Pt will improve gait speed to >1 ft/sec with LRAD    Baseline  improved to 1 ft/sec on 11/09/19    Status  Achieved        PT Long Term Goals - 10/12/19 1614      PT LONG TERM GOAL #1   Title  Pt will improve TUG to less than 15 seconds LRAD    Time  12    Period  Weeks    Status  New      PT LONG TERM GOAL #2   Title  Pt will improve 5TSTS test to <20 seconds.    Time  12    Period  Weeks    Status  New      PT LONG TERM GOAL #3   Title  Pt will improve gait speed greater than 1.5 ft/sec LRAD    Time  12    Period  Weeks    Status  New      PT LONG TERM GOAL #4   Title  Pt will be able to ambuate at least 750 ft for community ambulaiton mod I with LRAD.    Time  12    Period  Weeks      PT LONG TERM GOAL #5   Title  Pt will be able to negotiate at least one flight of stairs with only one handrail mod I in order to visit with friends/family or have better access to community    Time  [place] - 11/19/19 1507    Clinical Impression Statement  10th visit progress note: Pt's STGs tested on 11/09/19, pt has achieved all LTGs, pt improved his 5x sit <> stand score to 26 seconds, however still lacks eccentric control. With RW, pt's TUG time is 30 seconds, which indicates a high fall risk. Pt's gait speed of 1 ft/sec also indicates being in the  recurrent fall risk category. Focus of today's skilled session focused on BLE strengthening, standing activity tolerance, and dynamic gait activities. Pt tolerated session well - needed seated rest breaks intermittently throughout due to fatigue. Pt demonstrates genu recurvatum on RLE during balance activities in // bars. Pt able to  demonstrate improved posture after initial cueing, but then reverts back to forward flexed posture. Pt would continue to benefit from skilled PT to improve safety during functional mobility and to progress towards LTGs.    Personal Factors and Comorbidities  Age;Past/Current Experience    Examination-Activity Limitations  Bathing;Transfers;Locomotion Level;Reach Overhead;Bed Mobility;Bend;Self Feeding;Carry;Squat;Dressing;Stairs;Hygiene/Grooming;Stand;Lift;Toileting    Examination-Participation Restrictions  Meal Prep;Cleaning;Community Activity;Driving;Shop;Laundry;Yard Work    Merchant navy officer  Evolving/Moderate complexity    Rehab Potential  Good    PT Frequency  2x / week   2-3   PT Duration  12 weeks    PT Treatment/Interventions  ADLs/Self Care Home Management;Aquatic Therapy;Cryotherapy;English as a second language teacher;Therapeutic activities;Therapeutic exercise;Balance training;Neuromuscular re-education;Manual techniques;Orthotic Fit/Training;Patient/family education;Passive range of motion;Dry needling;Energy conservation;Splinting;Joint Manipulations;Taping    PT Next Visit Plan  Weight shifting (lateral in standing), gait training with R AFO donned and cues to improve hips over foot in midstance vs. flexed posture. Hamstring strength to reduce R genu recurvatum in midstance. Needs posture and hip flexor strengthening. Needs weight shifting/weight bearing through RLE.    PT Home Exercise Plan  Access Code: [email or web address], added stand hip abd, SL clams, bridge    Consulted and Agree with Plan of Care  Patient       Patient will benefit from skilled therapeutic intervention in order to improve the following deficits and impairments:  Abnormal gait, Decreased activity tolerance, Decreased balance, Decreased coordination, Decreased endurance, Decreased mobility, Decreased range of motion, Decreased strength, Difficulty walking, Hypomobility,  Increased fascial restricitons, Increased muscle spasms, Impaired flexibility, Postural dysfunction, Pain  Visit Diagnosis: Hemiplegia and hemiparesis following cerebral infarction affecting right dominant side (HCC)  Unsteadiness on feet  Other lack of coordination  Muscle weakness (generalized)     Problem List Patient Active Problem List   Diagnosis Date Noted  . Dysphagia, post-stroke   . Diabetes mellitus type 2 in obese ([place])   . Acute blood loss anemia   . Hypokalemia   . Diabetic peripheral neuropathy ([place])   . Right knee pain   . Acute ischemic right MCA stroke ([place]) 06/07/2019  . AMS (altered mental status)   . Incidental pulmonary nodule, > 47mm and < 40mm 06/01/2019  . CVA (cerebral vascular accident) ([place]) 05/31/2019  . 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 Onychomycosis 01/11/2015  . Pain in lower limb 01/11/2015  . Hammer toe of right foot 01/02/2015  . Pain in right foot 01/02/2015    Arliss Journey, PT, DPT  11/19/2019, 3:14 PM  [facility] [address] [place], [place], [address] Phone: [phone]   Fax:  [phone]  Name: Karac Lookabill MRN: [id number] Date of Birth: Apr 18, 1941

## 2019-11-23 ENCOUNTER — Ambulatory Visit: Payer: Medicare HMO

## 2019-11-23 ENCOUNTER — Other Ambulatory Visit: Payer: Self-pay

## 2019-11-23 ENCOUNTER — Ambulatory Visit: Payer: Medicare HMO | Admitting: Speech Pathology

## 2019-11-23 ENCOUNTER — Ambulatory Visit: Payer: Medicare HMO | Admitting: Occupational Therapy

## 2019-11-23 DIAGNOSIS — R2689 Other abnormalities of gait and mobility: Secondary | ICD-10-CM

## 2019-11-23 DIAGNOSIS — I69351 Hemiplegia and hemiparesis following cerebral infarction affecting right dominant side: Secondary | ICD-10-CM

## 2019-11-23 DIAGNOSIS — M6281 Muscle weakness (generalized): Secondary | ICD-10-CM

## 2019-11-23 DIAGNOSIS — R2681 Unsteadiness on feet: Secondary | ICD-10-CM | POA: Diagnosis not present

## 2019-11-23 DIAGNOSIS — R4701 Aphasia: Secondary | ICD-10-CM

## 2019-11-23 DIAGNOSIS — M79601 Pain in right arm: Secondary | ICD-10-CM

## 2019-11-23 NOTE — Therapy (Signed)
[facility] [address] [place], [place], [address] Phone: [phone]   Fax:  [phone]  Occupational Therapy Treatment  Patient Details  Name: Luke Rivera MRN: [id number] Date of Birth: [birth year] Referring Provider (OT): Frann Rider   Encounter Date: 11/23/2019  OT End of Session - 11/23/19 1211    Visit Number  8    Number of Visits  17    Date for OT Re-Evaluation  12/21/19    Authorization Type  Aetna MCR    Authorization - Visit Number  8    Authorization - Number of Visits  10    OT Start Time  [id number]    OT Stop Time  1100    OT Time Calculation (min)  45 min    Activity Tolerance  Patient tolerated treatment well    Behavior During Therapy  [facility] for tasks assessed/performed       Past Medical History:  Diagnosis Date  . CAD (coronary artery disease)   . CHF (congestive heart failure) ([place])   . Chronic anticoagulation   . DM2 (diabetes mellitus, type 2) ([place])   . DVT (deep vein thrombosis) in pregnancy   . GERD (gastroesophageal reflux disease)   . HTN (hypertension)   . TIA (transient ischemic attack)     No past surgical history on file.  There were no vitals filed for this visit.  Subjective Assessment - 11/23/19 1021    Patient is accompanied by:  Family member    Pertinent History  Lt MCA CVA 05/31/19 w/ residual Rt sh/hand syndrome. PMH: CAD, Chronic A-fib, DM2, CHF, HTN, peripheral neuropathy    Limitations  no driving    Currently in Pain?  Yes    Pain Score  4     Pain Location  Shoulder    Pain Orientation  Right    Pain Descriptors / Indicators  Aching    Pain Type  Acute pain    Pain Onset  More than a month ago    Pain Frequency  Intermittent    Aggravating Factors   movement, poor positioning    Pain Relieving Factors  tylenol, topical rubs       Seated - wt bearing BUE's and RUE only for body on arm movements including: lateral trunk flexion, forward reaching in prep  for sit to stand, cross reaching and ipsilateral reaching. Progressed to low level AA/ROM RUE in flexion and abduction w/ no increase in pain (w/ therapist distracting patient). Continued w/ bilateral AA/ROM simultaneously and reciprocally followed by functional low to mid level reaching BUE's. Pt required min distal support RUE to prevent sh compensations into abduction and IR.  Supine: pt had more pain w/ AA/ROM therefore began w/ gentle P/ROM in sh flex from bent elbow position. Also performed some trunk rotation with RUE supported bilateral horizontal add and abduction. Pt had increased pain in supine, but did better w/ just P/ROM in mid ranges                      OT Short Term Goals - 11/19/19 1645      OT SHORT TERM GOAL #1   Title  Independent with HEP for RUE    Status  Achieved      OT SHORT TERM GOAL #2   Title  Pt to report pain less than or equal to 3/10 RUE for functional ADLS    Status  On-going      OT  SHORT TERM GOAL #3   Title  Pt to write full sentence with 90% or greater legibility    Status  On-going      OT SHORT TERM GOAL #4   Title  Pt to use Rt dominant hand for eating 50% of the time and grooming 25% of the time    Status  On-going      OT SHORT TERM GOAL #5   Title  Pt to improve coordination Rt hand as evidenced by reducing speed on 9 hole peg test to 65 sec. or under    Status  On-going      OT SHORT TERM GOAL #6   Title  Pt to don/doff shoes and socks mod I level with A/E PRN    Status  Deferred   Patient wears compression socks, and AFO which he is unable to put on without help     OT SHORT TERM GOAL #7   Title  Pt to improve Rt shoulder flex to 110* in prep for high level reaching    Status  On-going        OT Long Term Goals - 10/21/19 2124      OT LONG TERM GOAL #1   Title  Independent with updated HEP    Time  8    Period  Weeks    Status  New      OT LONG TERM GOAL #2   Title  Pt to use Rt dominant hand for eating  75% of the time or more, and for grooming 50% of the time or more    Time  8    Period  Weeks    Status  New      OT LONG TERM GOAL #3   Title  Pt to improve grip strength Rt hand to 30 lbs or greater    Baseline  14 lbs    Time  8    Period  Weeks    Status  New      OT LONG TERM GOAL #4   Title  Pt to return to light cooking with supervision prn    Time  8    Period  Weeks    Status  New      OT LONG TERM GOAL #5   Title  Pt to improve coordination Rt hand as evidenced by performing 9 hole peg test in 55 sec. or under    Baseline  76.59 sec    Time  8    Period  Weeks    Status  New      Long Term Additional Goals   Additional Long Term Goals  Yes      OT LONG TERM GOAL #6   Title  Pt to perform high level reaching RUE to retrieve/replace light weight objects on high shelf 5/5 trials    Time  8    Period  Weeks    Status  New            Plan - 11/23/19 1212    Clinical Impression Statement  Pt did not show increase in pain Rt shoulder except in supine or if attempting higher ranges. Pt did well w/ low ranges especially in closed chain and bilateral movements.    Occupational performance deficits (Please refer to evaluation for details):  ADL's;IADL's    Body Structure / Function / Physical Skills  ADL;Strength;Pain;Tone;Proprioception;UE functional use;IADL;ROM;Sensation;Coordination;Flexibility;Mobility;FMC    Rehab Potential  Good    OT Frequency  2x / week    OT Duration  8 weeks    OT Treatment/Interventions  Self-care/ADL training;Moist Heat;Fluidtherapy;DME and/or AE instruction;Splinting;Aquatic Therapy;Therapeutic activities;Ultrasound;Therapeutic exercise;Cognitive remediation/compensation;Neuromuscular education;Functional Mobility Training;Passive range of motion;Visual/perceptual remediation/compensation;Electrical Stimulation;Paraffin;Manual Therapy;Patient/family education    Plan  NMR RUE/trunk, decrease shoulder pain.    OT Home Exercise Plan  FM  coord, gentle shoulder motion on moveable surface in sitting    Consulted and Agree with Plan of Care  Patient;Family member/caregiver    Family Member Consulted  wife- [name]       Patient will benefit from skilled therapeutic intervention in order to improve the following deficits and impairments:   Body Structure / Function / Physical Skills: ADL, Strength, Pain, Tone, Proprioception, UE functional use, IADL, ROM, Sensation, Coordination, Flexibility, Mobility, FMC       Visit Diagnosis: Hemiplegia and hemiparesis following cerebral infarction affecting right dominant side (HCC)  Pain in right arm  Muscle weakness (generalized)    Problem List Patient Active Problem List   Diagnosis Date Noted  . Dysphagia, post-stroke   . Diabetes mellitus type 2 in obese ([place])   . Acute blood loss anemia   . Hypokalemia   . Diabetic peripheral neuropathy ([place])   . Right knee pain   . Acute ischemic right MCA stroke ([place]) 06/07/2019  . AMS (altered mental status)   . Incidental pulmonary nodule, > 22mm and < 102mm 06/01/2019  . CVA (cerebral vascular accident) ([place]) 05/31/2019  . 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 Onychomycosis 01/11/2015  . Pain in lower limb 01/11/2015  . Hammer toe of right foot 01/02/2015  . Pain in right foot 01/02/2015    Carey Bullocks, OTR/L 11/23/2019, 12:14 PM  [facility] [address] [place], [place], [address] Phone: [phone]   Fax:  [phone]  Name: Luke Rivera MRN: [id number] Date of Birth: 03/15/1941

## 2019-11-23 NOTE — Therapy (Signed)
[facility] [address] [place], [place], [address] Phone: [phone]   Fax:  [phone]  Physical Therapy Treatment  Patient Details  Name: Luke Rivera MRN: [id number] Date of Birth: 1941-10-14 Referring Provider (PT): Frann Rider, NP   Encounter Date: 11/23/2019  PT End of Session - 11/23/19 1106    Visit Number  11    Number of Visits  24    Date for PT Re-Evaluation  01/04/20    Authorization Type  Aetna MCR    PT Start Time  1106    PT Stop Time  1145    PT Time Calculation (min)  39 min    Equipment Utilized During Treatment  Gait belt    Activity Tolerance  Patient tolerated treatment well    Behavior During Therapy  WFL for tasks assessed/performed       Past Medical History:  Diagnosis Date  . CAD (coronary artery disease)   . CHF (congestive heart failure) ([place])   . Chronic anticoagulation   . DM2 (diabetes mellitus, type 2) ([place])   . DVT (deep vein thrombosis) in pregnancy   . GERD (gastroesophageal reflux disease)   . HTN (hypertension)   . TIA (transient ischemic attack)     History reviewed. No pertinent surgical history.  There were no vitals filed for this visit.  Subjective Assessment - 11/23/19 1107    Subjective  Pt just finished with OT. Reports doing well.    Patient is accompained by:  Family member    How long can you stand comfortably?  10 min    How long can you walk comfortably?  10 min    Patient Stated Goals  get back to where he used to be    Currently in Pain?  No/denies                       [facility] Adult PT Treatment/Exercise - 11/23/19 1116      Transfers   Transfers  Sit to Stand;Stand to Sit    Sit to Stand  5: Supervision    Sit to Stand Details (indicate cue type and reason)  Increased time to rise    Stand to Sit  5: Supervision    Comments  Sit to stand from edge of mat x 5 getting balance each time then repeated with 2" step under left  foot to increase right weight shift.       Ambulation/Gait   Ambulation/Gait  Yes    Ambulation/Gait Assistance  5: Supervision    Ambulation/Gait Assistance Details  Pt was cued to hold head up for more upright posture.    Ambulation Distance (Feet)  230 Feet    Assistive device  Rolling walker   right AFO   Gait Pattern  Step-through pattern;Decreased step length - right;Decreased step length - left   right toe out   Ambulation Surface  Level;Indoor      Neuro Re-ed    Neuro Re-ed Details   Pt performed standing with 2" step under left foot: without UE support x 30 sec then with reaching x 30 sec in various directions and then with eyes closed x 30 sec with increased sway.  In // bars: standing on rockerboard positioned side to side trying to maintain level x 30 sec then rocking board side to side x 10. Step-ups on 4" step with right LE with PT providing tactile assist at right knee to prevent recurvatum and  verbal cuing, marching in place x 10 with PT stabilizing at right knee, standing TKEs keeping slight bend in right knee x 10.              PT Education - 11/23/19 1301    Education Details  Pt instructed to focus on right knee control with gait and when performing standing march at counter trying to keep a slight bend in knee    Person(s) Educated  Patient;Spouse    Methods  Explanation    Comprehension  Verbalized understanding       PT Short Term Goals - 11/09/19 1550      PT SHORT TERM GOAL #1   Title  Pt will be I and compliant with HEP. (STG target date 4 weeks 11/11/19)    Baseline  not consistent yet    Status  On-going      PT SHORT TERM GOAL #2   Title  Pt will improve 5TSTS test to <40 seconds    Baseline  scored 26 seconds when retested on 12/1    Status  Achieved      PT SHORT TERM GOAL #3   Title  Pt will improve TUG to less than 40 seconds    Baseline  improved to 30 sec on 11/09/19    Status  Achieved      PT SHORT TERM GOAL #4   Title  Pt will  improve gait speed to >1 ft/sec with LRAD    Baseline  improved to 1 ft/sec on 11/09/19    Status  Achieved        PT Long Term Goals - 10/12/19 1614      PT LONG TERM GOAL #1   Title  Pt will improve TUG to less than 15 seconds LRAD    Time  12    Period  Weeks    Status  New      PT LONG TERM GOAL #2   Title  Pt will improve 5TSTS test to <20 seconds.    Time  12    Period  Weeks    Status  New      PT LONG TERM GOAL #3   Title  Pt will improve gait speed greater than 1.5 ft/sec LRAD    Time  12    Period  Weeks    Status  New      PT LONG TERM GOAL #4   Title  Pt will be able to ambuate at least 750 ft for community ambulaiton mod I with LRAD.    Time  12    Period  Weeks      PT LONG TERM GOAL #5   Title  Pt will be able to negotiate at least one flight of stairs with only one handrail mod I in order to visit with friends/family or have better access to community    Time  12    Period  Weeks    Status  New            Plan - 11/23/19 1302    Clinical Impression Statement  Pt was able to show more right weight shift with gait and activities today. Pt needed tactile and verbal cues to try to decrease right knee recurvatum with balance and strengthening activities.    Personal Factors and Comorbidities  Age;Past/Current Experience    Examination-Activity Limitations  Bathing;Transfers;Locomotion Level;Reach Overhead;Bed Mobility;Bend;Self Feeding;Carry;Squat;Dressing;Stairs;Hygiene/Grooming;Stand;Lift;Toileting    Examination-Participation Restrictions  Meal Prep;Cleaning;Community Activity;Driving;Shop;Laundry;[employer] Work  Stability/Clinical Decision Making  Evolving/Moderate complexity    Rehab Potential  Good    PT Frequency  2x / week   2-3   PT Duration  12 weeks    PT Treatment/Interventions  ADLs/Self Care Home Management;Aquatic Therapy;Cryotherapy;English as a second language teacher;Therapeutic activities;Therapeutic  exercise;Balance training;Neuromuscular re-education;Manual techniques;Orthotic Fit/Training;Patient/family education;Passive range of motion;Dry needling;Energy conservation;Splinting;Joint Manipulations;Taping    PT Next Visit Plan  Weight shifting (lateral in standing), gait training with R AFO donned and cues to improve hips over foot in midstance vs. flexed posture. Hamstring strength to reduce R genu recurvatum in midstance. Needs posture and hip flexor strengthening. Work on Insurance account manager on right for better knee control.    PT Home Exercise Plan  Access Code: [email or web address], added stand hip abd, SL clams, bridge    Consulted and Agree with Plan of Care  Patient       Patient will benefit from skilled therapeutic intervention in order to improve the following deficits and impairments:  Abnormal gait, Decreased activity tolerance, Decreased balance, Decreased coordination, Decreased endurance, Decreased mobility, Decreased range of motion, Decreased strength, Difficulty walking, Hypomobility, Increased fascial restricitons, Increased muscle spasms, Impaired flexibility, Postural dysfunction, Pain  Visit Diagnosis: Muscle weakness (generalized)  Other abnormalities of gait and mobility     Problem List Patient Active Problem List   Diagnosis Date Noted  . Dysphagia, post-stroke   . Diabetes mellitus type 2 in obese ([place])   . Acute blood loss anemia   . Hypokalemia   . Diabetic peripheral neuropathy ([place])   . Right knee pain   . Acute ischemic right MCA stroke ([place]) 06/07/2019  . AMS (altered mental status)   . Incidental pulmonary nodule, > 37mm and < 15mm 06/01/2019  . CVA (cerebral vascular accident) ([place]) 05/31/2019  . 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 Onychomycosis 01/11/2015  . Pain in lower limb 01/11/2015  . Hammer toe of right foot 01/02/2015  . Pain in right foot 01/02/2015    Electa Sniff, PT, DPT, NCS 11/23/2019, 1:05 PM  [facility] [address] [place], [place], [address] Phone: [phone]   Fax:  [phone]  Name: Kennyth Pearson MRN: [id number] Date of Birth: January 03, 1941

## 2019-11-25 ENCOUNTER — Ambulatory Visit: Payer: Medicare HMO | Admitting: Occupational Therapy

## 2019-11-25 ENCOUNTER — Ambulatory Visit: Payer: Medicare HMO

## 2019-11-25 NOTE — Therapy (Signed)
[facility] [address] [place], [place], [address] Phone: [phone]   Fax:  [phone]  Speech Language Pathology Treatment  Patient Details  Name: Luke Rivera MRN: [id number] Date of Birth: 1941-07-22 Referring Provider (SLP): Frann Rider, NP   Encounter Date: 11/23/2019  End of Session - 11/25/19 0756    Visit Number  10    Number of Visits  17    Date for SLP Re-Evaluation  01/10/20    SLP Start Time  4332    SLP Stop Time   1230    SLP Time Calculation (min)  45 min    Activity Tolerance  Patient tolerated treatment well       Past Medical History:  Diagnosis Date  . CAD (coronary artery disease)   . CHF (congestive heart failure) ([place])   . Chronic anticoagulation   . DM2 (diabetes mellitus, type 2) ([place])   . DVT (deep vein thrombosis) in pregnancy   . GERD (gastroesophageal reflux disease)   . HTN (hypertension)   . TIA (transient ischemic attack)     No past surgical history on file.  There were no vitals filed for this visit.  Subjective Assessment - 11/25/19 0749    Subjective  "Are we through?"    Patient is accompained by:  Family member    Currently in Pain?  No/denies            ADULT SLP TREATMENT - 11/25/19 0749      General Information   Behavior/Cognition  Alert;Cooperative;Lethargic      Treatment Provided   Treatment provided  Cognitive-Linquistic      Pain Assessment   Pain Assessment  No/denies pain      Cognitive-Linquistic Treatment   Treatment focused on  Aphasia;Patient/family/caregiver education    Skilled Treatment  Patient/wife reported pt had successful conversation on the phone with a friend from church yesterday. Worked with pt and wife to ID ways to encourage patient to use complete sentences at home, including having intentional conversations re: more complex topics with wife at least once per day. SLP discussed patient's progress and goals for  remainder of therapy. Patient asked SLP where his skills are (communication), and SLP told pt that SLP assessment is that patient's speech and language is functional in mod complex conversation with extra time. Patient to consider whether he has additional goals for ST or whether he wishes to conclude in next 1-2 visits. Pt with telegraphic utterance to wife when leaving session; rephrased in sentence with min cue.      Assessment / Recommendations / Plan   Plan  Continue with current plan of care      Progression Toward Goals   Progression toward goals  Progressing toward goals       SLP Education - 11/25/19 0755    Education Details  opportunities to practice conversation at home       SLP Short Term Goals - 11/25/19 0757      SLP [place] #1   Title  pt to produce 18/20 functional sentence responses in structured therapy tasks over three sessions    Baseline  11-03-19    Status  Partially Met      SLP SHORT TERM GOAL #2   Title  pt will participate in assessment of reading comprehension and written expression    Status  Achieved      SLP SHORT TERM GOAL #3   Title  pt will participate in  8 minutes simple conversation over three sessions, functionally/with modified independence (compensations for anomia)    Baseline  11-03-19    Status  Partially Met       SLP Long Term Goals - 11/25/19 0758      SLP LONG TERM GOAL #1   Title  pt will participate in 10 minutes simple to mod complex conversation with modified independence, functionally, in 3 sessions    Baseline  11/16/19, 11-19-19 11-23-19    Time  2    Period  Weeks    Status  Achieved      SLP LONG TERM GOAL #2   Title  pt will use functional verbal expression in 8 minutes mod complex conversation with rare min A for anomia over three sessions    Baseline  11-19-19 11-13-19    Time  2    Period  Weeks    Status  On-going       Plan - 11/25/19 0757    Clinical Impression Statement  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 Pt again functional in his verbal expression in mod complex topics with extra time. Pt/wife cont to prefer to focus on verbal communication with ST at this time rather than add reading/writing skills. Pt to consider additional goals or whether he wishes to conclude ST in next 1-2 visits. Pt would benefit from cont'd skilled ST targeting verbal expression.    Speech Therapy Frequency  2x / week    Duration  --   8 weeks, or 17 visits   Treatment/Interventions  Functional tasks;Oral motor exercises;Multimodal communcation approach;SLP instruction and feedback;Compensatory strategies;Patient/family education;Internal/external aids;Cueing hierarchy;Language facilitation    Potential to Achieve Goals  Good    Consulted and Agree with Plan of Care  Patient       Patient will benefit from skilled therapeutic intervention in order to improve the following deficits and impairments:   Aphasia    Problem List Patient Active Problem List   Diagnosis Date Noted  . Dysphagia, post-stroke   . Diabetes mellitus type 2 in obese ([place])   . Acute blood loss anemia   . Hypokalemia   . Diabetic peripheral neuropathy ([place])   . Right knee pain   . Acute ischemic right MCA stroke ([place]) 06/07/2019  . AMS (altered mental status)   . Incidental pulmonary nodule, > 47m and < 84m06/23/2020  . CVA (cerebral vascular accident) ([place]06/22/2020  . 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 Onychomycosis 01/11/2015  . Pain in lower limb 01/11/2015  . Hammer toe of right foot 01/02/2015  . Pain in right foot 01/02/2015   Speech Therapy Progress Note  Dates of Reporting Period: 10/12/19 to 11/23/19  Patient has been seen for 10 speech therapy sessions targeting aphasia. Patient is using compensations with modified independence in mod complex conversation with extended time. Skilled ST recommended to continue for carryover of trained strategies  for communication. See goals/clinical impressions above for details.   MaDeneise LeverMSVermontCCC-SLP Speech-Language Pathologist   MaAliene Altes2/17/2020, 8:00 AM  [facility] Prospect CircleuKeansburgrDwightNCAl[place]hone: [phone] Fax:  [phone] Name: ThQuinnlan AbruzzoRN: [id number] of Birth: 3/Sep 13, 1941

## 2019-11-26 ENCOUNTER — Ambulatory Visit: Payer: Medicare HMO | Admitting: *Deleted

## 2019-11-30 ENCOUNTER — Ambulatory Visit: Payer: Medicare HMO | Admitting: Speech Pathology

## 2019-11-30 ENCOUNTER — Ambulatory Visit: Payer: Medicare HMO | Admitting: Physical Therapy

## 2019-11-30 ENCOUNTER — Other Ambulatory Visit: Payer: Self-pay

## 2019-11-30 ENCOUNTER — Ambulatory Visit: Payer: Medicare HMO | Admitting: Occupational Therapy

## 2019-11-30 ENCOUNTER — Encounter: Payer: Self-pay | Admitting: Occupational Therapy

## 2019-11-30 DIAGNOSIS — M79601 Pain in right arm: Secondary | ICD-10-CM

## 2019-11-30 DIAGNOSIS — R278 Other lack of coordination: Secondary | ICD-10-CM

## 2019-11-30 DIAGNOSIS — M79641 Pain in right hand: Secondary | ICD-10-CM

## 2019-11-30 DIAGNOSIS — I69351 Hemiplegia and hemiparesis following cerebral infarction affecting right dominant side: Secondary | ICD-10-CM

## 2019-11-30 DIAGNOSIS — M6281 Muscle weakness (generalized): Secondary | ICD-10-CM

## 2019-11-30 DIAGNOSIS — I69318 Other symptoms and signs involving cognitive functions following cerebral infarction: Secondary | ICD-10-CM

## 2019-11-30 DIAGNOSIS — R2681 Unsteadiness on feet: Secondary | ICD-10-CM | POA: Diagnosis not present

## 2019-11-30 DIAGNOSIS — R4701 Aphasia: Secondary | ICD-10-CM

## 2019-11-30 NOTE — Therapy (Signed)
[facility] [address] [place], [place], [address] Phone: [phone]   Fax:  [phone]  Physical Therapy Treatment  Patient Details  Name: Luke Rivera MRN: [id number] Date of Birth: 1941-11-07 Referring Provider (PT): Frann Rider, NP   Encounter Date: 11/30/2019  PT End of Session - 11/30/19 1955    Visit Number  12    Number of Visits  24    Date for PT Re-Evaluation  01/04/20    Authorization Type  [employer] MCR    PT Start Time  1618    PT Stop Time  1700    PT Time Calculation (min)  42 min    Equipment Utilized During Treatment  Gait belt    Activity Tolerance  Patient tolerated treatment well    Behavior During Therapy  [facility] for tasks assessed/performed       Past Medical History:  Diagnosis Date  . CAD (coronary artery disease)   . CHF (congestive heart failure) ([place])   . Chronic anticoagulation   . DM2 (diabetes mellitus, type 2) ([place])   . DVT (deep vein thrombosis) in pregnancy   . GERD (gastroesophageal reflux disease)   . HTN (hypertension)   . TIA (transient ischemic attack)     No past surgical history on file.  There were no vitals filed for this visit.  Subjective Assessment - 11/30/19 1622    Subjective  Doing well. No falls.    Patient is accompained by:  Family member    How long can you stand comfortably?  10 min    How long can you walk comfortably?  10 min    Patient Stated Goals  get back to where he used to be    Currently in Pain?  No/denies                       [facility] Adult PT Treatment/Exercise - 11/30/19 0001      Transfers   Comments  At edge of mat table: sit <> stands with 2" block under LLE for increased weight shift to RLE, performed reps from mat table raised for increased ease of transfer, pt able to perform 3 x 5 reps with no UE support for standing and for lowering, cues for forward weight prior to standing. In standing and LUE support on chair:  3 x 5 reps mini squats with cue for soft knee as well as therapist manual facilitation on R to prevent genu recurvatum, cues for technique and glute activation.       Ambulation/Gait   Ambulation/Gait  Yes    Ambulation/Gait Assistance  5: Supervision    Ambulation/Gait Assistance Details  into session from speech therapy - cues for upright posture    Ambulation Distance (Feet)  115 Feet    Assistive device  Rolling walker   R AFO   Gait Pattern  Step-through pattern;Decreased step length - right;Decreased step length - left   R toe out   Ambulation Surface  Level;Indoor      Exercises   Other Exercises   Supine: 1 x 10 bridges with cues for technique - pt reporting increased back pain after performing, 2 x 10 reps bent knee fall outs on R with green theraband (cues for eccentric control), 1 x 10 reps supine marching R and LLE, 1 x 10 reps quad sets on R, 2 x 5 reps active straight leg raise on right with cues for initial quad set, pt with  increased difficulty performing. 2 x 10 reps supine hamstring curls with RLE on red ball with therapist helping assist LE for positioning.              PT Short Term Goals - 11/09/19 1550      PT SHORT TERM GOAL #1   Title  Pt will be I and compliant with HEP. (STG target date 4 weeks 11/11/19)    Baseline  not consistent yet    Status  On-going      PT SHORT TERM GOAL #2   Title  Pt will improve 5TSTS test to <40 seconds    Baseline  scored 26 seconds when retested on 12/1    Status  Achieved      PT SHORT TERM GOAL #3   Title  Pt will improve TUG to less than 40 seconds    Baseline  improved to 30 sec on 11/09/19    Status  Achieved      PT SHORT TERM GOAL #4   Title  Pt will improve gait speed to >1 ft/sec with LRAD    Baseline  improved to 1 ft/sec on 11/09/19    Status  Achieved        PT Long Term Goals - 10/12/19 1614      PT LONG TERM GOAL #1   Title  Pt will improve TUG to less than 15 seconds LRAD    Time  12    Period   Weeks    Status  New      PT LONG TERM GOAL #2   Title  Pt will improve 5TSTS test to <20 seconds.    Time  12    Period  Weeks    Status  New      PT LONG TERM GOAL #3   Title  Pt will improve gait speed greater than 1.5 ft/sec LRAD    Time  12    Period  Weeks    Status  New      PT LONG TERM GOAL #4   Title  Pt will be able to ambuate at least 750 ft for community ambulaiton mod I with LRAD.    Time  12    Period  Weeks      PT LONG TERM GOAL #5   Title  Pt will be able to negotiate at least one flight of stairs with only one handrail mod I in order to visit with friends/family or have better access to community    Time  [place] - 11/30/19 2007    Clinical Impression Statement  Focus of today's skilled session was LE strengthening and weight shifting towards R during functional transfers. Pt fatigues easily with supine there-ex, especially after active straight leg raise on R. Pt able to perform sit <> stands from an elevated mat surface without UE support during standing and sitting, however pt had a couple of reps when it was initially difficult to get weight anterior before standing. Pt able to demonstrate eccentric control when sitting. Pt will continue to benefit from skilled PT to progress towards LTGs.    Personal Factors and Comorbidities  Age;Past/Current Experience    Examination-Activity Limitations  Bathing;Transfers;Locomotion Level;Reach Overhead;Bed Mobility;Bend;Self Feeding;Carry;Squat;Dressing;Stairs;Hygiene/Grooming;Stand;Lift;Toileting    Examination-Participation Restrictions  Meal Prep;Cleaning;Community Activity;Driving;Shop;Laundry;Yard Work    Merchant navy officer  Evolving/Moderate complexity  Rehab Potential  Good    PT Frequency  2x / week   2-3   PT Duration  12 weeks    PT Treatment/Interventions  ADLs/Self Care Home Management;Aquatic Therapy;Cryotherapy;Chief Technology Officer;Therapeutic activities;Therapeutic exercise;Balance training;Neuromuscular re-education;Manual techniques;Orthotic Fit/Training;Patient/family education;Passive range of motion;Dry needling;Energy conservation;Splinting;Joint Manipulations;Taping    PT Next Visit Plan  did pt get more visits scheduled? Weight shifting (lateral in standing), gait training with R AFO donned and cues to improve hips over foot in midstance vs. flexed posture. Hamstring strength to reduce R genu recurvatum in midstance. Needs posture and hip flexor strengthening. Work on Insurance account manager on right for better knee control.    PT Home Exercise Plan  Access Code: [email or web address], added stand hip abd, SL clams, bridge    Consulted and Agree with Plan of Care  Patient       Patient will benefit from skilled therapeutic intervention in order to improve the following deficits and impairments:  Abnormal gait, Decreased activity tolerance, Decreased balance, Decreased coordination, Decreased endurance, Decreased mobility, Decreased range of motion, Decreased strength, Difficulty walking, Hypomobility, Increased fascial restricitons, Increased muscle spasms, Impaired flexibility, Postural dysfunction, Pain  Visit Diagnosis: Muscle weakness (generalized)  Hemiplegia and hemiparesis following cerebral infarction affecting right dominant side (HCC)  Unsteadiness on feet  Other lack of coordination     Problem List Patient Active Problem List   Diagnosis Date Noted  . Dysphagia, post-stroke   . Diabetes mellitus type 2 in obese ([place])   . Acute blood loss anemia   . Hypokalemia   . Diabetic peripheral neuropathy ([place])   . Right knee pain   . Acute ischemic right MCA stroke ([place]) 06/07/2019  . AMS (altered mental status)   . Incidental pulmonary nodule, > 61mm and < 28mm 06/01/2019  . CVA (cerebral vascular accident) ([place]) 05/31/2019  . 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 Gout  of foot 01/11/2015  . Onychomycosis 01/11/2015  . Pain in lower limb 01/11/2015  . Hammer toe of right foot 01/02/2015  . Pain in right foot 01/02/2015    Arliss Journey, PT, DPT 11/30/2019, 8:07 PM  [facility] [address] [place], [place], [address] Phone: [phone]   Fax:  [phone]  Name: Luke Rivera MRN: [id number] Date of Birth: Sep 05, 1941

## 2019-11-30 NOTE — Therapy (Signed)
[facility] [address] [place], [place], [address] Phone: [phone]   Fax:  [phone]  Occupational Therapy Treatment  Patient Details  Name: Luke Rivera MRN: [id number] Date of Birth: 12-Oct-1941 Referring Provider (OT): Frann Rider   Encounter Date: 11/30/2019  OT End of Session - 11/30/19 1832    Visit Number  9    Number of Visits  17    Date for OT Re-Evaluation  12/21/19    Authorization Type  Aetna MCR    Authorization - Visit Number  9    Authorization - Number of Visits  10    OT Start Time  1700    OT Stop Time  1750    OT Time Calculation (min)  50 min    Activity Tolerance  Patient tolerated treatment well    Behavior During Therapy  [facility] for tasks assessed/performed       Past Medical History:  Diagnosis Date  . CAD (coronary artery disease)   . CHF (congestive heart failure) ([place])   . Chronic anticoagulation   . DM2 (diabetes mellitus, type 2) ([place])   . DVT (deep vein thrombosis) in pregnancy   . GERD (gastroesophageal reflux disease)   . HTN (hypertension)   . TIA (transient ischemic attack)     History reviewed. No pertinent surgical history.  There were no vitals filed for this visit.  Subjective Assessment - 11/30/19 1702    Subjective   I got the hot pack - it helps a little.    Currently in Pain?  No/denies    Pain Score  0-No pain         [facility] OT Assessment - 11/30/19 0001      Coordination   Right 9 Hole Peg Test  97.39 sec               OT Treatments/Exercises (OP) - 11/30/19 0001      ADLs   Eating  Patient reports use of RUE approx 30% at [facility] time.  Patient with strong preference to use shoulder abduction and internal rotation for hand to mouth pattern.  With cueing, patient able to bring forearm into contact with surface for more natural patterning.  ALos dicussed consistencies that would help him be more effective - pudding, potatoes, oatmeal, finger  foods.  Reviewed short term goal of 50% use of RUE for meals.      LB Dressing  Besides putting on pants with elastic at ankles, compression socks and AFO, patient is able to dress lower body with only set up assistance.  Patient's wife indicated that he got his pants on by himself the other day.  Encouraged them to keep pushing for greater independence.      Writing  Worked on components of handwriting.  Patient with limited hand movement on forearm, Patient with excessive grip on pen.  Worked with patient on larger hand movements, repetitive patterns to begin to free wrist and hand motion, from motion fof elbow and shoulder.  Did better with repetition.  Patient and wife both able to discern better use of hand motion.        Neurological Re-education Exercises   Other Exercises 1  Neuromuscular reeducation to address proximal weakness and postural control during functional ambulation.  Patient with significant weakness in hip abductors and overuse of UE's on walker during gait, leading to flexed posture - trunk, hips and knees.  Patient very fearful to decrease hands from  support to  take a step.  Patient able to focus on reducing weight through hands for 3-4 steps before retunring to that preferred pattern.               OT Education - 11/30/19 1832    Education Details  reviewed short temr goals and time frame for progresss note    Person(s) Educated  Patient;Spouse    Methods  Explanation;Demonstration    Comprehension  Verbalized understanding       OT Short Term Goals - 11/30/19 1706      OT SHORT TERM GOAL #1   Title  Independent with HEP for RUE    Status  Achieved      OT SHORT TERM GOAL #2   Title  Pt to report pain less than or equal to 3/10 RUE for functional ADLS    Status  Achieved        OT Long Term Goals - 10/21/19 2124      OT LONG TERM GOAL #1   Title  Independent with updated HEP    Time  8    Period  Weeks    Status  New      OT LONG TERM GOAL #2    Title  Pt to use Rt dominant hand for eating 75% of the time or more, and for grooming 50% of the time or more    Time  8    Period  Weeks    Status  New      OT LONG TERM GOAL #3   Title  Pt to improve grip strength Rt hand to 30 lbs or greater    Baseline  14 lbs    Time  8    Period  Weeks    Status  New      OT LONG TERM GOAL #4   Title  Pt to return to light cooking with supervision prn    Time  8    Period  Weeks    Status  New      OT LONG TERM GOAL #5   Title  Pt to improve coordination Rt hand as evidenced by performing 9 hole peg test in 55 sec. or under    Baseline  76.59 sec    Time  8    Period  Weeks    Status  New      Long Term Additional Goals   Additional Long Term Goals  Yes      OT LONG TERM GOAL #6   Title  Pt to perform high level reaching RUE to retrieve/replace light weight objects on high shelf 5/5 trials    Time  8    Period  Weeks    Status  New            Plan - 11/30/19 1833    Clinical Impression Statement  Patient is showing improved activity tolerance, and slight increase in participation with basic self care skills.    OT Frequency  2x / week    OT Duration  8 weeks    OT Treatment/Interventions  Self-care/ADL training;Moist Heat;Fluidtherapy;DME and/or AE instruction;Splinting;Aquatic Therapy;Therapeutic activities;Ultrasound;Therapeutic exercise;Cognitive remediation/compensation;Neuromuscular education;Functional Mobility Training;Passive range of motion;Visual/perceptual remediation/compensation;Electrical Stimulation;Paraffin;Manual Therapy;Patient/family education    Plan  Progress note - review goals    OT Home Exercise Plan  FM coord, gentle shoulder motion on moveable surface in sitting    Consulted and Agree with Plan of Care  Patient;Family member/caregiver    Family Member Consulted  wife- [name]       Patient will benefit from skilled therapeutic intervention in order to improve the following deficits and impairments:            Visit Diagnosis: Muscle weakness (generalized)  Hemiplegia and hemiparesis following cerebral infarction affecting right dominant side (HCC)  Pain in right arm  Unsteadiness on feet  Other lack of coordination  Pain in right hand  Other symptoms and signs involving cognitive functions following cerebral infarction    Problem List Patient Active Problem List   Diagnosis Date Noted  . Dysphagia, post-stroke   . Diabetes mellitus type 2 in obese ([place])   . Acute blood loss anemia   . Hypokalemia   . Diabetic peripheral neuropathy ([place])   . Right knee pain   . Acute ischemic right MCA stroke ([place]) 06/07/2019  . AMS (altered mental status)   . Incidental pulmonary nodule, > 62mm and < 59mm 06/01/2019  . CVA (cerebral vascular accident) ([place]) 05/31/2019  . 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 Onychomycosis 01/11/2015  . Pain in lower limb 01/11/2015  . Hammer toe of right foot 01/02/2015  . Pain in right foot 01/02/2015    Mariah Milling, OTR/L 11/30/2019, 6:36 PM  [facility] [address] [place], [place], [address] Phone: [phone]   Fax:  [phone]  Name: Luke Rivera MRN: [id number] Date of Birth: February 06, 1941

## 2019-12-01 NOTE — Therapy (Signed)
[facility] [address] [place], [place], [address] Phone: [phone]   Fax:  [phone]  Speech Language Pathology Treatment  Patient Details  Name: Luke Rivera MRN: [id number] Date of Birth: 1941/04/06 Referring Provider (SLP): Frann Rider, NP   Encounter Date: 11/30/2019  End of Session - 12/01/19 0658    Visit Number  11    Number of Visits  17    Date for SLP Re-Evaluation  01/10/20    SLP Start Time  8588    SLP Stop Time   1615    SLP Time Calculation (min)  42 min    Activity Tolerance  Patient tolerated treatment well       Past Medical History:  Diagnosis Date  . CAD (coronary artery disease)   . CHF (congestive heart failure) ([place])   . Chronic anticoagulation   . DM2 (diabetes mellitus, type 2) ([place])   . DVT (deep vein thrombosis) in pregnancy   . GERD (gastroesophageal reflux disease)   . HTN (hypertension)   . TIA (transient ischemic attack)     No past surgical history on file.  There were no vitals filed for this visit.  Subjective Assessment - 12/01/19 0652    Subjective  "I don't have any good ones." (stories)    Patient is accompained by:  Family member    Currently in Pain?  No/denies            ADULT SLP TREATMENT - 12/01/19 5027      General Information   Behavior/Cognition  Alert;Cooperative      Treatment Provided   Treatment provided  Cognitive-Linquistic      Cognitive-Linquistic Treatment   Treatment focused on  Aphasia;Patient/family/caregiver education    Skilled Treatment  Pt less lethargic today (ST was first appointment). Had several phone conversations this week that went well, per pt. Required prompting from ST to elaborate when responding to questions, but when he did so mod complex conversation was functional with extended time. Patient asked SLP "where am I supposed to go," and whether he would benefit from continuing to work on "storytelling,"  (conversation). SLP educated pt that he has met LTGs at this time, and offered examples of additional ST goals that might be relevant to pt (speaking at church or in front of a group, etc). Patient and wife state they are pleased with current functional level and do not have additional goals for ST at this time. SLP reinforced recommendations for daily conversations with family and friends to continue to utilize and build pt's conversational skills and fluidity. Shared resource for virtual aphasia group should pt wish to participate.      Assessment / Recommendations / Plan   Plan  Discharge SLP treatment due to (comment);All goals met      Progression Toward Goals   Progression toward goals  Goals met, education completed, patient discharged from [facility] - 12/01/19 0658    Education Details  practice conversation daily, virtual aphasia groups for conversation    Person(s) Educated  Patient;Spouse    Methods  Explanation;Handout    Comprehension  Verbalized understanding       SLP Short Term Goals - 12/01/19 0659      SLP SHORT TERM GOAL #1   Title  pt to produce 18/20 functional sentence responses in structured therapy tasks over three sessions    Baseline  11-03-19    Status  Partially Met  SLP SHORT TERM GOAL #2   Title  pt will participate in assessment of reading comprehension and written expression    Status  Achieved      SLP SHORT TERM GOAL #3   Title  pt will participate in 8 minutes simple conversation over three sessions, functionally/with modified independence (compensations for anomia)    Baseline  11-03-19    Status  Partially Met       SLP Long Term Goals - 12/01/19 0659      SLP LONG TERM GOAL #1   Title  pt will participate in 10 minutes simple to mod complex conversation with modified independence, functionally, in 3 sessions    Baseline  11/16/19, 11-19-19 11-23-19    Time  2    Period  Weeks    Status  Achieved      SLP LONG TERM  GOAL #2   Title  pt will use functional verbal expression in 8 minutes mod complex conversation with rare min A for anomia over three sessions    Baseline  11-19-19 11-13-19 11-30-19    Time  2    Period  Weeks    Status  Achieved       Plan - 12/01/19 9774    Clinical Impression Statement  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 Pt again functional in his verbal expression in mod complex topics with extra time. Pt using compensations functionally today. Pt has achieved LTGs and is in agreement with d/c at this time.    Speech Therapy Frequency  2x / week    Duration  --   8 weeks, or 17 visits   Treatment/Interventions  Functional tasks;Oral motor exercises;Multimodal communcation approach;SLP instruction and feedback;Compensatory strategies;Patient/family education;Internal/external aids;Cueing hierarchy;Language facilitation    Potential to Achieve Goals  Good    Consulted and Agree with Plan of Care  Patient       Patient will benefit from skilled therapeutic intervention in order to improve the following deficits and impairments:   Aphasia    Problem List Patient Active Problem List   Diagnosis Date Noted  . Dysphagia, post-stroke   . Diabetes mellitus type 2 in obese ([place])   . Acute blood loss anemia   . Hypokalemia   . Diabetic peripheral neuropathy ([place])   . Right knee pain   . Acute ischemic right MCA stroke ([place]) 06/07/2019  . AMS (altered mental status)   . Incidental pulmonary nodule, > 51m and < 853m06/23/2020  . CVA (cerebral vascular accident) ([place]06/22/2020  . 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 Onychomycosis 01/11/2015  . Pain in lower limb 01/11/2015  . Hammer toe of right foot 01/02/2015  . Pain in right foot 01/02/2015   SPEECH THERAPY DISCHARGE SUMMARY  Visits from Start of Care: 11  Current functional level related to goals / functional outcomes: Mod complex conversation/use of  compensations functional with extended time. 2/2 LTGs met. See details above.   Remaining deficits: Mild anomic aphasia persists; patient requires extra time to relay message, and may encounter more difficulty in stressful or emotional conversations.   Education / Equipment: Compensations for anomia, continue to practice conversation daily, aphasia resources/virtual support Plan: Patient agrees to discharge.  Patient goals were met. Patient is being discharged due to meeting the stated rehab goals.  ?????        MaDeneise LeverMSVermontCCC-SLP Speech-Language Pathologist   MaAliene Altes2/23/2020, 7:01  AM  [facility] [address] [place], [place], [address] Phone: [phone]   Fax:  [phone]   Name: Jailan Trimm MRN: [id number] Date of Birth: 1941-03-22

## 2019-12-07 ENCOUNTER — Ambulatory Visit: Payer: Medicare HMO | Admitting: Speech Pathology

## 2019-12-07 ENCOUNTER — Ambulatory Visit: Payer: Medicare HMO

## 2019-12-07 ENCOUNTER — Other Ambulatory Visit: Payer: Self-pay

## 2019-12-07 ENCOUNTER — Ambulatory Visit: Payer: Medicare HMO | Admitting: Occupational Therapy

## 2019-12-07 DIAGNOSIS — R2689 Other abnormalities of gait and mobility: Secondary | ICD-10-CM

## 2019-12-07 DIAGNOSIS — M6281 Muscle weakness (generalized): Secondary | ICD-10-CM

## 2019-12-07 DIAGNOSIS — R2681 Unsteadiness on feet: Secondary | ICD-10-CM | POA: Diagnosis not present

## 2019-12-07 DIAGNOSIS — R278 Other lack of coordination: Secondary | ICD-10-CM

## 2019-12-07 DIAGNOSIS — I69351 Hemiplegia and hemiparesis following cerebral infarction affecting right dominant side: Secondary | ICD-10-CM

## 2019-12-07 NOTE — Therapy (Signed)
[facility] [address] [place], [place], [address] Phone: [phone]   Fax:  [phone]  Occupational Therapy Treatment  Patient Details  Name: Luke Rivera MRN: [id number] Date of Birth: [birth year] Referring Provider (OT): Frann Rider   Encounter Date: 12/07/2019  OT End of Session - 12/07/19 1412    Visit Number  10    Number of Visits  17    Date for OT Re-Evaluation  12/21/19    Authorization Type  [employer] MCR    Authorization - Visit Number  10    Authorization - Number of Visits  10    OT Start Time  4496    OT Stop Time  1400    OT Time Calculation (min)  45 min    Activity Tolerance  Patient tolerated treatment well    Behavior During Therapy  [facility] for tasks assessed/performed       Past Medical History:  Diagnosis Date  . CAD (coronary artery disease)   . CHF (congestive heart failure) ([place])   . Chronic anticoagulation   . DM2 (diabetes mellitus, type 2) ([place])   . DVT (deep vein thrombosis) in pregnancy   . GERD (gastroesophageal reflux disease)   . HTN (hypertension)   . TIA (transient ischemic attack)     No past surgical history on file.  There were no vitals filed for this visit.  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 PMH: CAD, Chronic A-fib, DM2, CHF, HTN, peripheral neuropathy    Limitations  no driving    Currently in Pain?  No/denies       Assessed progress towards STG's.  9 hole peg test = 80.53 sec. When asked if he was working on coordination HEP at home, pt/wife reports he is not.  Part of today's session was spent discussing ways to implement RUE into more activity including ADLS, functional coordination tasks, playing card games, etc. Also discussed lack of interest/motivation and recommended talking with neuropsychologist (pt has already seen and will  see again in March).  Discussed modified version of CIMT program at home ONLY when seated by placing oven mitt on Lt hand for 30 minutes at least 1x/day.   Practiced writing and isolated movement at wrist vs. Using entire arm to formulate each letter. Pt improved in legibility w/ practice and cues to write bigger.                       OT Short Term Goals - 12/07/19 1319      OT SHORT TERM GOAL #1   Title  Independent with HEP for RUE    Status  Achieved      OT SHORT TERM GOAL #2   Title  Pt to report pain less than or equal to 3/10 RUE for functional ADLS    Status  Achieved      OT SHORT TERM GOAL #3   Title  Pt to write full sentence with 90% or greater legibility    Status  Achieved   in print     OT SHORT TERM GOAL #4   Title  Pt to use Rt dominant hand for eating 50% of the time and grooming 25% of the time    Status  On-going   approx 30%     OT SHORT TERM  GOAL #5   Title  Pt to improve coordination Rt hand as evidenced by reducing speed on 9 hole peg test to 65 sec. or under    Status  On-going   80.53 sec     OT SHORT TERM GOAL #6   Title  Pt to don/doff shoes and socks mod I level with A/E PRN    Status  Partially Met   Patient wears compression socks, and AFO which he is unable to put on without help     OT SHORT TERM GOAL #7   Title  Pt to improve Rt shoulder flex to 110* in prep for high level reaching    Status  Achieved   met w/ compensations       OT Long Term Goals - 10/21/19 2124      OT LONG TERM GOAL #1   Title  Independent with updated HEP    Time  8    Period  Weeks    Status  New      OT LONG TERM GOAL #2   Title  Pt to use Rt dominant hand for eating 75% of the time or more, and for grooming 50% of the time or more    Time  8    Period  Weeks    Status  New      OT LONG TERM GOAL #3   Title  Pt to improve grip strength Rt hand to 30 lbs or greater    Baseline  14 lbs    Time  8    Period  Weeks    Status  New       OT LONG TERM GOAL #4   Title  Pt to return to light cooking with supervision prn    Time  8    Period  Weeks    Status  New      OT LONG TERM GOAL #5   Title  Pt to improve coordination Rt hand as evidenced by performing 9 hole peg test in 55 sec. or under    Baseline  76.59 sec    Time  8    Period  Weeks    Status  New      Long Term Additional Goals   Additional Long Term Goals  Yes      OT LONG TERM GOAL #6   Title  Pt to perform high level reaching RUE to retrieve/replace light weight objects on high shelf 5/5 trials    Time  8    Period  Weeks    Status  New            Plan - 12/07/19 1414    Clinical Impression Statement  This 10th progress note is from 10/21/19 - 12/07/19: Pt has met 3/6 STG's at this time. Pt slowly progressing in participation of ADLS. Pt continues to demo decreased motivation however and is not performing HEP's.    Occupational performance deficits (Please refer to evaluation for details):  ADL's;IADL's    Body Structure / Function / Physical Skills  ADL;Strength;Pain;Tone;Proprioception;UE functional use;IADL;ROM;Sensation;Coordination;Flexibility;Mobility;FMC    Rehab Potential  Good    OT Frequency  2x / week    OT Duration  8 weeks    OT Treatment/Interventions  Self-care/ADL training;Moist Heat;Fluidtherapy;DME and/or AE instruction;Splinting;Aquatic Therapy;Therapeutic activities;Ultrasound;Therapeutic exercise;Cognitive remediation/compensation;Neuromuscular education;Functional Mobility Training;Passive range of motion;Visual/perceptual remediation/compensation;Electrical Stimulation;Paraffin;Manual Therapy;Patient/family education    Plan  continue progress towards goals       Patient will benefit from  skilled therapeutic intervention in order to improve the following deficits and impairments:   Body Structure / Function / Physical Skills: ADL, Strength, Pain, Tone, Proprioception, UE functional use, IADL, ROM, Sensation,  Coordination, Flexibility, Mobility, FMC       Visit Diagnosis: Hemiplegia and hemiparesis following cerebral infarction affecting right dominant side (HCC)  Other lack of coordination    Problem List Patient Active Problem List   Diagnosis Date Noted  . Dysphagia, post-stroke   . Diabetes mellitus type 2 in obese ([place])   . Acute blood loss anemia   . Hypokalemia   . Diabetic peripheral neuropathy ([place])   . Right knee pain   . Acute ischemic right MCA stroke ([place]) 06/07/2019  . AMS (altered mental status)   . Incidental pulmonary nodule, > 65m and < 854m06/23/2020  . CVA (cerebral vascular accident) ([facility] [place]06/22/2020  . 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 Onychomycosis 01/11/2015  . Pain in lower limb 01/11/2015  . Hammer toe of right foot 01/02/2015  . Pain in right foot 01/02/2015    BaCarey BullocksOTR/L 12/07/2019, 2:16 PM  [facility]one: [phone] Fax:  [phone]: ThOrlondo HolycrossRN: [id number] of Birth: 02/1941-01-12

## 2019-12-07 NOTE — Therapy (Signed)
[facility] [address] [place], [place], [address] Phone: [phone]   Fax:  [phone]  Physical Therapy Treatment  Patient Details  Name: Luke Rivera MRN: [id number] Date of Birth: 1941/09/03 Referring Provider (PT): Frann Rider, NP   Encounter Date: 12/07/2019  PT End of Session - 12/07/19 1207    Visit Number  13    Number of Visits  24    Date for PT Re-Evaluation  01/04/20    Authorization Type  Aetna MCR    PT Start Time  1203    PT Stop Time  1244    PT Time Calculation (min)  41 min    Equipment Utilized During Treatment  Gait belt    Activity Tolerance  Patient tolerated treatment well    Behavior During Therapy  [facility] for tasks assessed/performed       Past Medical History:  Diagnosis Date  . CAD (coronary artery disease)   . CHF (congestive heart failure) ([place])   . Chronic anticoagulation   . DM2 (diabetes mellitus, type 2) ([place])   . DVT (deep vein thrombosis) in pregnancy   . GERD (gastroesophageal reflux disease)   . HTN (hypertension)   . TIA (transient ischemic attack)     No past surgical history on file.  There were no vitals filed for this visit.  Subjective Assessment - 12/07/19 1207    Subjective  Pt reports doing well. Exercises are going ok. Reports doing at least every other day.    Patient is accompained by:  Family member    How long can you stand comfortably?  10 min    How long can you walk comfortably?  10 min    Patient Stated Goals  get back to where he used to be    Currently in Pain?  No/denies                       [facility] Adult PT Treatment/Exercise - 12/07/19 1215      Ambulation/Gait   Ambulation/Gait  Yes    Ambulation/Gait Assistance  6: Modified independent (Device/Increase time)    Ambulation/Gait Assistance Details  Pt was cued to stay up in walker    Ambulation Distance (Feet)  230 Feet   60' x 1 walking in clinic   Assistive device   Rolling walker   right AFO   Gait Pattern  Step-to pattern;Decreased stance time - right;Right genu recurvatum    Ambulation Surface  Level;Indoor    Gait Comments  Pt ambulated another 100' out of clinic at end of session with RW mod I.       Neuro Re-ed    Neuro Re-ed Details   Standing at walker: feet apart with eyes closed without UE support x 30 sec then feet together eyes closed x 30 sec. Increased sway with eyes closed. Pt attempted tandem stance but had decreased stability and advised to stagger feet more for more support. Performed with right foot in front x 20 sec CGA.      Exercises   Exercises  Other Exercises    Other Exercises   Pt performed standing at bottom of stairs tapping x 10 each LE on bottom step with UE support. PT provided assistance at right knee to try to help stabilize and verbal cuing to try to keep slight bend in knee with right stance but heavy recurvatum. Supine bridges x 10. Pt reported some discomfort in low back so was  cued to brace tummy when performed and reported feeling better. Pt performed bridge over red theraball  10 x 2 with PT providing CGA to stabilize legs. Left sidelying for right clamshell 10 x 2. Pt denied performing this on HEP but is on there and PT advised to add in to his exercises. Pt performed sit to stand from edge of mat x 5 with getting balance each time. Standing right hamstring curl x 10.              PT Education - 12/07/19 1300    Education Details  Pt was instructed to continue to try to walk more at home adding extra lap after using bathroom to get more walking. Also reviewed bridges and clamshell for HEP.    Person(s) Educated  Patient;Spouse    Methods  Explanation;Demonstration    Comprehension  Verbalized understanding;Returned demonstration       PT Short Term Goals - 11/09/19 1550      PT SHORT TERM GOAL #1   Title  Pt will be I and compliant with HEP. (STG target date 4 weeks 11/11/19)    Baseline  not consistent  yet    Status  On-going      PT SHORT TERM GOAL #2   Title  Pt will improve 5TSTS test to <40 seconds    Baseline  scored 26 seconds when retested on 12/1    Status  Achieved      PT SHORT TERM GOAL #3   Title  Pt will improve TUG to less than 40 seconds    Baseline  improved to 30 sec on 11/09/19    Status  Achieved      PT SHORT TERM GOAL #4   Title  Pt will improve gait speed to >1 ft/sec with LRAD    Baseline  improved to 1 ft/sec on 11/09/19    Status  Achieved        PT Long Term Goals - 10/12/19 1614      PT LONG TERM GOAL #1   Title  Pt will improve TUG to less than 15 seconds LRAD    Time  12    Period  Weeks    Status  New      PT LONG TERM GOAL #2   Title  Pt will improve 5TSTS test to <20 seconds.    Time  12    Period  Weeks    Status  New      PT LONG TERM GOAL #3   Title  Pt will improve gait speed greater than 1.5 ft/sec LRAD    Time  12    Period  Weeks    Status  New      PT LONG TERM GOAL #4   Title  Pt will be able to ambuate at least 750 ft for community ambulaiton mod I with LRAD.    Time  12    Period  Weeks      PT LONG TERM GOAL #5   Title  Pt will be able to negotiate at least one flight of stairs with only one handrail mod I in order to visit with friends/family or have better access to community    Time  12    Period  Weeks    Status  New            Plan - 12/07/19 1229    Clinical Impression Statement  Pt able to increase gait distance today. Pt  was shifting weight to right more with transfers with less recurvatum but unable to prevent with gait.    Personal Factors and Comorbidities  Age;Past/Current Experience    Examination-Activity Limitations  Bathing;Transfers;Locomotion Level;Reach Overhead;Bed Mobility;Bend;Self Feeding;Carry;Squat;Dressing;Stairs;Hygiene/Grooming;Stand;Lift;Toileting    Examination-Participation Restrictions  Meal Prep;Cleaning;Community Activity;Driving;Shop;Laundry;Yard Work    Copy  Evolving/Moderate complexity    Rehab Potential  Good    PT Frequency  2x / week   2-3   PT Duration  12 weeks    PT Treatment/Interventions  ADLs/Self Care Home Management;Aquatic Therapy;Cryotherapy;English as a second language teacher;Therapeutic activities;Therapeutic exercise;Balance training;Neuromuscular re-education;Manual techniques;Orthotic Fit/Training;Patient/family education;Passive range of motion;Dry needling;Energy conservation;Splinting;Joint Manipulations;Taping    PT Next Visit Plan  Weight shifting (lateral in standing), gait training with R AFO donned and cues to improve hips over foot in midstance vs. flexed posture. Hamstring strength to reduce R genu recurvatum in midstance. Needs posture and hip flexor strengthening. Work on Insurance account manager on right for better knee control.    PT Home Exercise Plan  Access Code: [email or web address], added stand hip abd, SL clams, bridge    Consulted and Agree with Plan of Care  Patient       Patient will benefit from skilled therapeutic intervention in order to improve the following deficits and impairments:  Abnormal gait, Decreased activity tolerance, Decreased balance, Decreased coordination, Decreased endurance, Decreased mobility, Decreased range of motion, Decreased strength, Difficulty walking, Hypomobility, Increased fascial restricitons, Increased muscle spasms, Impaired flexibility, Postural dysfunction, Pain  Visit Diagnosis: Muscle weakness (generalized)  Other abnormalities of gait and mobility     Problem List Patient Active Problem List   Diagnosis Date Noted  . Dysphagia, post-stroke   . Diabetes mellitus type 2 in obese ([place])   . Acute blood loss anemia   . Hypokalemia   . Diabetic peripheral neuropathy ([place])   . Right knee pain   . Acute ischemic right MCA stroke ([place]) 06/07/2019  . AMS (altered mental status)   . Incidental pulmonary nodule, > 29mm and  < 53mm 06/01/2019  . CVA (cerebral vascular accident) ([place]) 05/31/2019  . 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 Onychomycosis 01/11/2015  . Pain in lower limb 01/11/2015  . Hammer toe of right foot 01/02/2015  . Pain in right foot 01/02/2015    Electa Sniff, PT,DPT, NCS 12/07/2019, 1:03 PM  [facility] [address] [place], [place], [address] Phone: [phone]   Fax:  [phone]  Name: Luke Rivera MRN: [id number] Date of Birth: [birth year]

## 2019-12-09 ENCOUNTER — Ambulatory Visit: Payer: Medicare HMO

## 2019-12-09 ENCOUNTER — Ambulatory Visit: Payer: Medicare HMO | Admitting: Occupational Therapy

## 2019-12-09 ENCOUNTER — Other Ambulatory Visit: Payer: Self-pay

## 2019-12-09 VITALS — BP 100/58 | HR 120

## 2019-12-09 DIAGNOSIS — I69351 Hemiplegia and hemiparesis following cerebral infarction affecting right dominant side: Secondary | ICD-10-CM

## 2019-12-09 DIAGNOSIS — M6281 Muscle weakness (generalized): Secondary | ICD-10-CM

## 2019-12-09 DIAGNOSIS — R278 Other lack of coordination: Secondary | ICD-10-CM

## 2019-12-09 DIAGNOSIS — R2689 Other abnormalities of gait and mobility: Secondary | ICD-10-CM

## 2019-12-09 DIAGNOSIS — R2681 Unsteadiness on feet: Secondary | ICD-10-CM | POA: Diagnosis not present

## 2019-12-09 NOTE — Therapy (Signed)
[facility] [address] [place], [place], [address] Phone: [phone]   Fax:  [phone]  Physical Therapy Treatment  Patient Details  Name: Luke Rivera MRN: [id number] Date of Birth: Dec 02, 1941 Referring Provider (PT): Frann Rider, NP   Encounter Date: 12/09/2019  PT End of Session - 12/09/19 1621    Visit Number  14    Number of Visits  24    Date for PT Re-Evaluation  01/04/20    Authorization Type  [employer] MCR    PT Start Time  1620    PT Stop Time  1708    PT Time Calculation (min)  48 min    Equipment Utilized During Treatment  Gait belt    Activity Tolerance  Patient tolerated treatment well    Behavior During Therapy  [facility] for tasks assessed/performed       Past Medical History:  Diagnosis Date  . CAD (coronary artery disease)   . CHF (congestive heart failure) ([place])   . Chronic anticoagulation   . DM2 (diabetes mellitus, type 2) ([place])   . DVT (deep vein thrombosis) in pregnancy   . GERD (gastroesophageal reflux disease)   . HTN (hypertension)   . TIA (transient ischemic attack)     History reviewed. No pertinent surgical history.  Vitals:   12/09/19 1713  BP: (!) 100/58  Pulse: (!) 120    Subjective Assessment - 12/09/19 1620    Subjective  Pt just finished with OT.    Patient is accompained by:  Family member    How long can you stand comfortably?  10 min    How long can you walk comfortably?  10 min    Patient Stated Goals  get back to where he used to be    Currently in Pain?  Yes    Pain Location  Shoulder    Pain Orientation  Right    Pain Descriptors / Indicators  Aching    Pain Type  Chronic pain                       [facility] Adult PT Treatment/Exercise - 12/09/19 1621      Transfers   Transfers  Sit to Stand;Stand to Sit    Sit to Stand  5: Supervision    Stand to Sit  5: Supervision      Ambulation/Gait   Ambulation/Gait  Yes    Ambulation/Gait  Assistance  6: Modified independent (Device/Increase time)    Ambulation/Gait Assistance Details  Pt was cued to stay up in walker for more erect posture    Ambulation Distance (Feet)  115 Feet    Assistive device  Rolling walker   right AFO   Gait Pattern  Step-through pattern;Decreased stance time - right;Right genu recurvatum    Ambulation Surface  Level;Indoor    Gait Comments  Pt stopped due to fatigue and discomfort in right arm      Self-Care   Self-Care  Other Self-Care Comments    Other Self-Care Comments   PT discussed with patient about trying to find way to incorporate activities in to day to make easier for him. Pt reporting he has trouble finding the motivation to perform exercises and he has never been someone who enjoyed exercise. PT discussed slimming down HEP to 2-3 of the most important things to work on to make more doable. Pt reports he does do the bridges, clamshell and a SLR in bed at night.  Also ok with doing side stepping and discussed option of sit to stand or mini-squat whichever is easier on right shoulder. Discussed how progress is slower after first 6 months of stroke but that he could still see some gradual improvement if he worked on things. Discussed speaking with MD about his lack of motivation/depressed affect if continues to affect his ability to do things and happiness. Wife present during discussion. Looked at meds and nothing for depression other than celexa which patient reports he is more taking for pain. PT also discussed importance of drinking more water as showing some signs of dehydration with lightheadedness, low BP and elevated HR. Had patient drink glass of water before leaving session and wife said she had another bottle in car she would have him drink.      Neuro Re-ed    Neuro Re-ed Details   At counter: sidestepping with light UE support 6' x 4. Standing feet apart without UE support x 30 sec eyes open and x 30 sec eyes closed close SBA for safety  with cues for erect posture. Standing on airex x 30 sec eyes open. Pt had difficulty clearing right foot when stepping on airex. Pt performed tapping airex x 10 with right LE holding to counter.      Exercises   Exercises  Other Exercises    Other Exercises   Mini-squats at counter 10 x 2 with verbal cues for form and that arms were just for balance and not to pull up with.             PT Education - 12/09/19 1724    Education Details  Pt reviewed HEP and slimmed down to try to make more doable for patient. Discussed trying 2-3 things a day and walking. Also educated on signs/symptoms of dehydration.    Person(s) Educated  Patient;Spouse    Methods  Explanation;Demonstration;Handout    Comprehension  Verbalized understanding       PT Short Term Goals - 11/09/19 1550      PT SHORT TERM GOAL #1   Title  Pt will be I and compliant with HEP. (STG target date 4 weeks 11/11/19)    Baseline  not consistent yet    Status  On-going      PT SHORT TERM GOAL #2   Title  Pt will improve 5TSTS test to <40 seconds    Baseline  scored 26 seconds when retested on 12/1    Status  Achieved      PT SHORT TERM GOAL #3   Title  Pt will improve TUG to less than 40 seconds    Baseline  improved to 30 sec on 11/09/19    Status  Achieved      PT SHORT TERM GOAL #4   Title  Pt will improve gait speed to >1 ft/sec with LRAD    Baseline  improved to 1 ft/sec on 11/09/19    Status  Achieved        PT Long Term Goals - 10/12/19 1614      PT LONG TERM GOAL #1   Title  Pt will improve TUG to less than 15 seconds LRAD    Time  12    Period  Weeks    Status  New      PT LONG TERM GOAL #2   Title  Pt will improve 5TSTS test to <20 seconds.    Time  12    Period  Weeks    Status  New      PT LONG TERM GOAL #3   Title  Pt will improve gait speed greater than 1.5 ft/sec LRAD    Time  12    Period  Weeks    Status  New      PT LONG TERM GOAL #4   Title  Pt will be able to ambuate at least  750 ft for community ambulaiton mod I with LRAD.    Time  12    Period  Weeks      PT LONG TERM GOAL #5   Title  Pt will be able to negotiate at least one flight of stairs with only one handrail mod I in order to visit with friends/family or have better access to community    Time  12    Period  Weeks    Status  New            Plan - 12/09/19 1725    Clinical Impression Statement  Pt with depressed affect throughout session. Reports he is having trouble with motivation to do things but trying to do a couple things. Has never been someone who liked to exercise. Pt also showing some signs of possible dehydration today with low BP and elevated HR. Had only drunk 2 glasses all day. Explained importance of drinking more and perhaps talking to MD due to depressed affect. States he has never mentioned to MD.    Personal Factors and Comorbidities  Age;Past/Current Experience    Examination-Activity Limitations  Bathing;Transfers;Locomotion Level;Reach Overhead;Bed Mobility;Bend;Self Feeding;Carry;Squat;Dressing;Stairs;Hygiene/Grooming;Stand;Lift;Toileting    Examination-Participation Restrictions  Meal Prep;Cleaning;Community Activity;Driving;Shop;Laundry;Yard Work    Merchant [employer] officer  Evolving/Moderate complexity    Rehab Potential  Good    PT Frequency  2x / week   2-3   PT Duration  12 weeks    PT Treatment/Interventions  ADLs/Self Care Home Management;Aquatic Therapy;Cryotherapy;English as a second language teacher;Therapeutic activities;Therapeutic exercise;Balance training;Neuromuscular re-education;Manual techniques;Orthotic Fit/Training;Patient/family education;Passive range of motion;Dry needling;Energy conservation;Splinting;Joint Manipulations;Taping    PT Next Visit Plan  Check vitals to see how doing next session. Low BP and high HR last session. Is he drinking more? Also try not to overload on HEP as patient seems overwhelmed.   gait training with R AFO donned and cues to improve hips over foot in midstance vs. flexed posture. Hamstring strength to reduce R genu recurvatum in midstance. Needs posture and hip flexor strengthening. Work on Insurance account manager on right for better knee control.    PT Home Exercise Plan  Access Code: [email or web address], added stand hip abd, SL clams, bridge    Consulted and Agree with Plan of Care  Patient       Patient will benefit from skilled therapeutic intervention in order to improve the following deficits and impairments:  Abnormal gait, Decreased activity tolerance, Decreased balance, Decreased coordination, Decreased endurance, Decreased mobility, Decreased range of motion, Decreased strength, Difficulty walking, Hypomobility, Increased fascial restricitons, Increased muscle spasms, Impaired flexibility, Postural dysfunction, Pain  Visit Diagnosis: Muscle weakness (generalized)  Other abnormalities of gait and mobility     Problem List Patient Active Problem List   Diagnosis Date Noted  . Dysphagia, post-stroke   . Diabetes mellitus type 2 in obese ([place])   . Acute blood loss anemia   . Hypokalemia   . Diabetic peripheral neuropathy ([place])   . Right knee pain   . 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 AMS (altered mental status)   . Incidental pulmonary nodule, > 7mm and <  33mm 06/01/2019  . CVA (cerebral vascular accident) ([place]) 05/31/2019  . 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 Onychomycosis 01/11/2015  . Pain in lower limb 01/11/2015  . Hammer toe of right foot 01/02/2015  . Pain in right foot 01/02/2015    Electa Sniff, PT, DPT, NCS 12/09/2019, 5:29 PM  [facility] [address] [place], [place], [address] Phone: [phone]   Fax:  [phone]  Name: Luke Rivera MRN: [id number] Date of Birth: 09-25-1941

## 2019-12-09 NOTE — Therapy (Signed)
[facility] [address] [place], [place], [address] Phone: [phone]   Fax:  [phone]  Occupational Therapy Treatment  Patient Details  Name: Luke Rivera MRN: [id number] Date of Birth: [birth year] Referring Provider (OT): Frann Rider   Encounter Date: 12/09/2019  OT End of Session - 12/09/19 1627    Visit Number  11    Number of Visits  17    Date for OT Re-Evaluation  12/21/19    Authorization Type  Aetna MCR    Authorization - Visit Number  11    Authorization - Number of Visits  20    OT Start Time  6644    OT Stop Time  1615    OT Time Calculation (min)  45 min    Activity Tolerance  Patient tolerated treatment well    Behavior During Therapy  [facility] for tasks assessed/performed       Past Medical History:  Diagnosis Date  . CAD (coronary artery disease)   . CHF (congestive heart failure) ([place])   . Chronic anticoagulation   . DM2 (diabetes mellitus, type 2) ([place])   . DVT (deep vein thrombosis) in pregnancy   . GERD (gastroesophageal reflux disease)   . HTN (hypertension)   . TIA (transient ischemic attack)     No past surgical history on file.  There were no vitals filed for this visit.  Subjective Assessment - 12/09/19 1626    Subjective   Per wife - she ordered some large pegs and pegboard but the pegboard was too tight.    Patient is accompanied by:  Family member   wife   Pertinent History  Lt MCA CVA 05/31/19 w/ residual Rt sh/hand syndrome. PMH: CAD, Chronic A-fib, DM2, CHF, HTN, peripheral neuropathy    Limitations  no driving    Currently in Pain?  No/denies       [name] reports she ordered the large pegs but he could not get into pegboard because too tight. Pt/wife shown how to wiggle the hole slightly bigger and also emphasized finger placement on peg (thumb on top) would help. Pt practiced w/ same pegboard in clinic and was able to perform.  Pt placed checkers into Connect 4  slots RUE but on lower surface to minimize shoulder compensations (table too high). Gripper set at level 1 resistance to pick up blocks Rt hand for sustained grip strength, wrist control - pt required mod facilitation and distal assist to minimize sh compensations.  Seated at mat: AA/ROM RUE in mid range slightly inclined surface sh flexion w/ cup in hand for visual cue to keep neutral rotation in shoulder and prevent IR and abduction. Progressed to same activity open chain in lower level.                       OT Short Term Goals - 12/07/19 1319      OT SHORT TERM GOAL #1   Title  Independent with HEP for RUE    Status  Achieved      OT SHORT TERM GOAL #2   Title  Pt to report pain less than or equal to 3/10 RUE for functional ADLS    Status  Achieved      OT SHORT TERM GOAL #3   Title  Pt to write full sentence with 90% or greater legibility    Status  Achieved   in print     OT SHORT TERM GOAL #4  Title  Pt to use Rt dominant hand for eating 50% of the time and grooming 25% of the time    Status  On-going   approx 30%     OT SHORT TERM GOAL #5   Title  Pt to improve coordination Rt hand as evidenced by reducing speed on 9 hole peg test to 65 sec. or under    Status  On-going   80.53 sec     OT SHORT TERM GOAL #6   Title  Pt to don/doff shoes and socks mod I level with A/E PRN    Status  Partially Met   Patient wears compression socks, and AFO which he is unable to put on without help     OT SHORT TERM GOAL #7   Title  Pt to improve Rt shoulder flex to 110* in prep for high level reaching    Status  Achieved   met w/ compensations       OT Long Term Goals - 10/21/19 2124      OT LONG TERM GOAL #1   Title  Independent with updated HEP    Time  8    Period  Weeks    Status  New      OT LONG TERM GOAL #2   Title  Pt to use Rt dominant hand for eating 75% of the time or more, and for grooming 50% of the time or more    Time  8    Period  Weeks     Status  New      OT LONG TERM GOAL #3   Title  Pt to improve grip strength Rt hand to 30 lbs or greater    Baseline  14 lbs    Time  8    Period  Weeks    Status  New      OT LONG TERM GOAL #4   Title  Pt to return to light cooking with supervision prn    Time  8    Period  Weeks    Status  New      OT LONG TERM GOAL #5   Title  Pt to improve coordination Rt hand as evidenced by performing 9 hole peg test in 55 sec. or under    Baseline  76.59 sec    Time  8    Period  Weeks    Status  New      Long Term Additional Goals   Additional Long Term Goals  Yes      OT LONG TERM GOAL #6   Title  Pt to perform high level reaching RUE to retrieve/replace light weight objects on high shelf 5/5 trials    Time  8    Period  Weeks    Status  New            Plan - 12/09/19 1628    Clinical Impression Statement  Pt gradually progressing with use of RUE in low level activities    Occupational performance deficits (Please refer to evaluation for details):  ADL's;IADL's    Body Structure / Function / Physical Skills  ADL;Strength;Pain;Tone;Proprioception;UE functional use;IADL;ROM;Sensation;Coordination;Flexibility;Mobility;FMC    Rehab Potential  Good    OT Frequency  2x / week    OT Duration  8 weeks    OT Treatment/Interventions  Self-care/ADL training;Moist Heat;Fluidtherapy;DME and/or AE instruction;Splinting;Aquatic Therapy;Therapeutic activities;Ultrasound;Therapeutic exercise;Cognitive remediation/compensation;Neuromuscular education;Functional Mobility Training;Passive range of motion;Visual/perceptual remediation/compensation;Electrical Stimulation;Paraffin;Manual Therapy;Patient/family education    Plan  continue NMR,  functional use of RUE    Consulted and Agree with Plan of Care  Patient;Family member/caregiver    Family Member Consulted  wife- [name]       Patient will benefit from skilled therapeutic intervention in order to improve the following deficits and  impairments:   Body Structure / Function / Physical Skills: ADL, Strength, Pain, Tone, Proprioception, UE functional use, IADL, ROM, Sensation, Coordination, Flexibility, Mobility, FMC       Visit Diagnosis: Hemiplegia and hemiparesis following cerebral infarction affecting right dominant side (HCC)  Other lack of coordination  Muscle weakness (generalized)    Problem List Patient Active Problem List   Diagnosis Date Noted  . Dysphagia, post-stroke   . Diabetes mellitus type 2 in obese ([place])   . Acute blood loss anemia   . Hypokalemia   . Diabetic peripheral neuropathy ([place])   . Right knee pain   . Acute ischemic right MCA stroke ([place]) 06/07/2019  . AMS (altered mental status)   . Incidental pulmonary nodule, > 70m and < 855m06/23/2020  . CVA (cerebral vascular accident) ([place]06/22/2020  . 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 Onychomycosis 01/11/2015  . Pain in lower limb 01/11/2015  . Hammer toe of right foot 01/02/2015  . Pain in right foot 01/02/2015    BaCarey BullocksOTR/L 12/09/2019, 4:31 PM  [facility]: [phone] Fax:  [phone]: Luke MaselliRN: [id number] of Birth: 02/1941-06-29

## 2019-12-09 NOTE — Patient Instructions (Signed)
Access Code: [email or web address]  URL: [email or web address]  Date: 12/09/2019  Prepared by: Cherly Anderson   Exercises Supine Bridge - 10 reps - 1-2 sets - 5 hold - 2x daily - 6x weekly Clamshell - 10 reps - 2 sets - 2x daily - 6x weekly Side Stepping with Counter Support - 3-5 reps - 1 sets - 2x daily - 6x weekly Sit to Stand with Armchair - 5 reps - 1-2 sets - 2x daily - 6x weekly                  Standing March with Counter Support - 10 reps - 1-2 sets - 2x daily - 6x weekly Standing Tandem Balance with Counter Support - 3 reps - 1 sets - 30 hold - 2x daily - 6x weekly

## 2019-12-14 ENCOUNTER — Ambulatory Visit: Payer: Medicare HMO | Attending: Adult Health | Admitting: Occupational Therapy

## 2019-12-14 ENCOUNTER — Other Ambulatory Visit: Payer: Self-pay

## 2019-12-14 DIAGNOSIS — I69351 Hemiplegia and hemiparesis following cerebral infarction affecting right dominant side: Secondary | ICD-10-CM | POA: Diagnosis not present

## 2019-12-14 DIAGNOSIS — R2689 Other abnormalities of gait and mobility: Secondary | ICD-10-CM | POA: Insufficient documentation

## 2019-12-14 DIAGNOSIS — M79641 Pain in right hand: Secondary | ICD-10-CM | POA: Insufficient documentation

## 2019-12-14 DIAGNOSIS — M6281 Muscle weakness (generalized): Secondary | ICD-10-CM | POA: Diagnosis present

## 2019-12-14 DIAGNOSIS — R278 Other lack of coordination: Secondary | ICD-10-CM | POA: Diagnosis present

## 2019-12-14 DIAGNOSIS — R2681 Unsteadiness on feet: Secondary | ICD-10-CM | POA: Insufficient documentation

## 2019-12-14 DIAGNOSIS — I69318 Other symptoms and signs involving cognitive functions following cerebral infarction: Secondary | ICD-10-CM | POA: Insufficient documentation

## 2019-12-14 DIAGNOSIS — M79601 Pain in right arm: Secondary | ICD-10-CM | POA: Insufficient documentation

## 2019-12-14 NOTE — Therapy (Signed)
[facility] [address] [place], [place], [address] Phone: [phone]   Fax:  [phone]  Occupational Therapy Treatment  Patient Details  Name: Luke Rivera MRN: [id number] Date of Birth: Oct 04, 1941 Referring Provider (OT): Frann Rider   Encounter Date: 12/14/2019  OT End of Session - 12/14/19 1504    Visit Number  12    Number of Visits  17    Date for OT Re-Evaluation  12/21/19    Authorization Type  Aetna MCR    Authorization - Visit Number  12    Authorization - Number of Visits  20    OT Start Time  1100    OT Stop Time  1150    OT Time Calculation (min)  50 min    Activity Tolerance  Patient tolerated treatment well    Behavior During Therapy  [facility] for tasks assessed/performed       Past Medical History:  Diagnosis Date  . CAD (coronary artery disease)   . CHF (congestive heart failure) ([place])   . Chronic anticoagulation   . DM2 (diabetes mellitus, type 2) ([place])   . DVT (deep vein thrombosis) in pregnancy   . GERD (gastroesophageal reflux disease)   . HTN (hypertension)   . TIA (transient ischemic attack)     No past surgical history on file.  There were no vitals filed for this visit.  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 PMH: CAD, Chronic A-fib, DM2, CHF, HTN, peripheral neuropathy    Limitations  no driving    Currently in Pain?  Yes    Pain Score  6     Pain Location  Shoulder    Pain Orientation  Right    Pain Descriptors / Indicators  Aching    Pain Type  Chronic pain    Pain Onset  More than a month ago    Pain Frequency  Intermittent    Aggravating Factors   movement, poor positioning    Pain Relieving Factors  tylenol, topical rubs       Practiced placing large pegs in pegboard Rt hand (tabletop) by color for increased challenge. Pt performed w/  compensations at shoulder into abduction.  Recommended practicing lower (placing chair w/ pegs opposite him) but anticipate he could progress to smaller tee sized pegs if kept lower.  In kitchen, discussed safety for fall prevention and correct walker negotiation in prep for getting things in/out of refrigerator, cabinets, and transporting items from one location to another. Recommended walker tray. Wife present for all education. Will practice snack prep next session.  Recommended pt begin walking into kitchen w/ walker to get simple snack, load/unloard top rack of dishwasher w/ supervision initially to increase mobility and function.  Pt still demo decreased motivation/lack of interest (? Depression vs. Lethargy) and recommended calling neuropsychologist earlier than scheduled appointment as wife expresses frustration w/ this and pt's burst of anger towards her if she asks him to do more. Wife agreed.                       OT Short Term Goals - 12/14/19 1110      OT SHORT TERM GOAL #1   Title  Independent with HEP for RUE    Status  Achieved      OT  SHORT TERM GOAL #2   Title  Pt to report pain less than or equal to 3/10 RUE for functional ADLS    Status  Achieved      OT SHORT TERM GOAL #3   Title  Pt to write full sentence with 90% or greater legibility    Status  Achieved   in print     OT SHORT TERM GOAL #4   Title  Pt to use Rt dominant hand for eating 50% of the time and grooming 25% of the time    Status  Achieved   approx 30%     OT SHORT TERM GOAL #5   Title  Pt to improve coordination Rt hand as evidenced by reducing speed on 9 hole peg test to 65 sec. or under    Status  On-going   80.53 sec     OT SHORT TERM GOAL #6   Title  Pt to don/doff shoes and socks mod I level with A/E PRN    Status  Partially Met   Patient wears compression socks, and AFO which he is unable to put on without help     OT SHORT TERM GOAL #7   Title  Pt to improve Rt  shoulder flex to 110* in prep for high level reaching    Status  Achieved   met w/ compensations       OT Long Term Goals - 12/14/19 1505      OT LONG TERM GOAL #1   Title  Independent with updated HEP    Time  8    Period  Weeks    Status  New      OT LONG TERM GOAL #2   Title  Pt to use Rt dominant hand for eating 75% of the time or more, and for grooming 50% of the time or more    Time  8    Period  Weeks    Status  On-going      OT LONG TERM GOAL #3   Title  Pt to improve grip strength Rt hand to 30 lbs or greater    Baseline  14 lbs    Time  8    Period  Weeks    Status  New      OT LONG TERM GOAL #4   Title  Pt to perform simple snack prep w/ supervision prn    Time  8    Period  Weeks    Status  Revised      OT LONG TERM GOAL #5   Title  Pt to improve coordination Rt hand as evidenced by performing 9 hole peg test in 55 sec. or under    Baseline  76.59 sec    Time  8    Period  Weeks    Status  New      OT LONG TERM GOAL #6   Title  Pt to perform mid level reaching RUE to retrieve/replace light weight objects on high shelf 5/5 trials    Time  8    Period  Weeks    Status  Revised            Plan - 12/14/19 1506    Clinical Impression Statement  Revised some LTG's as appropriate. Pt using Rt hand more for eating/grooming, but still requires a lot of encouragement t/o session and for carryover at home. Wife is expressing some frustration d/t lack of interest/motivation and reports he is getting  upset w/ her when she asks him to do something. Recommended calling neuropsych sooner than 02/10/20 appointment.    Occupational performance deficits (Please refer to evaluation for details):  ADL's;IADL's    Body Structure / Function / Physical Skills  ADL;Strength;Pain;Tone;Proprioception;UE functional use;IADL;ROM;Sensation;Coordination;Flexibility;Mobility;FMC    Rehab Potential  Good    OT Frequency  2x / week    OT Duration  8 weeks    OT  Treatment/Interventions  Self-care/ADL training;Moist Heat;Fluidtherapy;DME and/or AE instruction;Splinting;Aquatic Therapy;Therapeutic activities;Ultrasound;Therapeutic exercise;Cognitive remediation/compensation;Neuromuscular education;Functional Mobility Training;Passive range of motion;Visual/perceptual remediation/compensation;Electrical Stimulation;Paraffin;Manual Therapy;Patient/family education    Plan  practice snack prep, continue handwriting and grip strength       Patient will benefit from skilled therapeutic intervention in order to improve the following deficits and impairments:   Body Structure / Function / Physical Skills: ADL, Strength, Pain, Tone, Proprioception, UE functional use, IADL, ROM, Sensation, Coordination, Flexibility, Mobility, FMC       Visit Diagnosis: Hemiplegia and hemiparesis following cerebral infarction affecting right dominant side (HCC)  Unsteadiness on feet  Other lack of coordination    Problem List Patient Active Problem List   Diagnosis Date Noted  . Dysphagia, post-stroke   . Diabetes mellitus type 2 in obese ([place])   . Acute blood loss anemia   . Hypokalemia   . Diabetic peripheral neuropathy ([place])   . Right knee pain   . Acute ischemic right MCA stroke ([place]) 06/07/2019  . AMS (altered mental status)   . Incidental pulmonary nodule, > 35m and < 826m06/23/2020  . CVA (cerebral vascular accident) ([place]/22[place]  . 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 Onychomycosis 01/11/2015  . Pain in lower limb 01/11/2015  . Hammer toe of right foot 01/02/2015  . Pain in right foot 01/02/2015    BaCarey BullocksOTR/L 12/14/2019, 3:09 PM  [facility]u[place]hone: [phone] Fax:  [phone]: ThDontea CorlewRN: [id number] of Birth: 3/08-29-1942

## 2019-12-16 ENCOUNTER — Ambulatory Visit: Payer: Medicare HMO | Admitting: Occupational Therapy

## 2019-12-16 ENCOUNTER — Ambulatory Visit: Payer: Medicare HMO | Admitting: Rehabilitation

## 2019-12-21 ENCOUNTER — Encounter: Payer: Self-pay | Admitting: Occupational Therapy

## 2019-12-21 ENCOUNTER — Encounter: Payer: Self-pay | Admitting: Physical Therapy

## 2019-12-21 ENCOUNTER — Ambulatory Visit: Payer: Medicare HMO | Admitting: Physical Therapy

## 2019-12-21 ENCOUNTER — Other Ambulatory Visit: Payer: Self-pay

## 2019-12-21 ENCOUNTER — Ambulatory Visit: Payer: Medicare HMO | Admitting: Occupational Therapy

## 2019-12-21 VITALS — BP 114/65 | HR 71

## 2019-12-21 DIAGNOSIS — R2681 Unsteadiness on feet: Secondary | ICD-10-CM

## 2019-12-21 DIAGNOSIS — M6281 Muscle weakness (generalized): Secondary | ICD-10-CM

## 2019-12-21 DIAGNOSIS — I69318 Other symptoms and signs involving cognitive functions following cerebral infarction: Secondary | ICD-10-CM

## 2019-12-21 DIAGNOSIS — I69351 Hemiplegia and hemiparesis following cerebral infarction affecting right dominant side: Secondary | ICD-10-CM | POA: Diagnosis not present

## 2019-12-21 DIAGNOSIS — M79601 Pain in right arm: Secondary | ICD-10-CM

## 2019-12-21 DIAGNOSIS — M79641 Pain in right hand: Secondary | ICD-10-CM

## 2019-12-21 DIAGNOSIS — R278 Other lack of coordination: Secondary | ICD-10-CM

## 2019-12-21 NOTE — Therapy (Signed)
[facility] [address] [place], [place], [address] Phone: [phone]   Fax:  [phone]  Occupational Therapy Treatment  Patient Details  Name: Luke Rivera MRN: [id number] Date of Birth: 01-27-1941 Referring Provider (OT): Frann Rider   Encounter Date: 12/21/2019  OT End of Session - 12/21/19 1548    Visit Number  13    Number of Visits  17    Date for OT Re-Evaluation  12/21/19    Authorization Type  Aetna MCR    Authorization - Visit Number  13    Authorization - Number of Visits  20    OT Start Time  5830    OT Stop Time  1530    OT Time Calculation (min)  45 min       Past Medical History:  Diagnosis Date  . CAD (coronary artery disease)   . CHF (congestive heart failure) ([place])   . Chronic anticoagulation   . DM2 (diabetes mellitus, type 2) ([place])   . DVT (deep vein thrombosis) in pregnancy   . GERD (gastroesophageal reflux disease)   . HTN (hypertension)   . TIA (transient ischemic attack)     History reviewed. No pertinent surgical history.  There were no vitals filed for this visit.  Subjective Assessment - 12/21/19 1453    Subjective   I made a sandwhich and got myself a drink.  It wore me out!    Patient is accompanied by:  Family member    Currently in Pain?  No/denies    Pain Score  0-No pain                   OT Treatments/Exercises (OP) - 12/21/19 0001      ADLs   Functional Mobility  Patient and wife reports that after his last OT session he was motivated to try and make himself a snack. He made a sandwhich and got a drink and chips, navigating through cupboards, refrigerator, and pantry.  Worked in ADL kitchen to address ability to balance, use Rue to reach into low cabinets, drawers.  Practiced use of walker to safely open doors. Patient able to reach to floor to pick up lightweight items in standing with RUE and no assistance.  Patient enjoyed cooking prior to CVA.   Discussed engaging in a familiar recipe (soup, cake) to allow him to engage in something that was meaningful.               OT Education - 12/21/19 1548    Education Details  safe use of walker in functional setting - kitchen    Person(s) Educated  Patient;Spouse    Methods  Explanation;Demonstration;Verbal cues    Comprehension  Verbalized understanding;Returned demonstration       OT Short Term Goals - 12/21/19 1606      OT SHORT TERM GOAL #1   Title  Independent with HEP for RUE    Status  Achieved      OT SHORT TERM GOAL #2   Title  Pt to report pain less than or equal to 3/10 RUE for functional ADLS    Status  Achieved      OT SHORT TERM GOAL #3   Title  Pt to write full sentence with 90% or greater legibility    Status  Achieved      OT SHORT TERM GOAL #4   Title  Pt to use Rt dominant hand for eating 50% of the time and  grooming 25% of the time    Status  Achieved      OT SHORT TERM GOAL #5   Title  Pt to improve coordination Rt hand as evidenced by reducing speed on 9 hole peg test to 65 sec. or under    Status  On-going      OT SHORT TERM GOAL #6   Title  Pt to don/doff shoes and socks mod I level with A/E PRN    Status  Partially Met      OT SHORT TERM GOAL #7   Title  Pt to improve Rt shoulder flex to 110* in prep for high level reaching    Status  Achieved        OT Long Term Goals - 12/21/19 1606      OT LONG TERM GOAL #1   Title  Independent with updated HEP    Status  On-going      OT LONG TERM GOAL #2   Title  Pt to use Rt dominant hand for eating 75% of the time or more, and for grooming 50% of the time or more    Status  On-going      OT LONG TERM GOAL #3   Title  Pt to improve grip strength Rt hand to 30 lbs or greater    Status  On-going      OT LONG TERM GOAL #4   Title  Pt to perform simple snack prep w/ supervision prn    Status  Achieved      OT LONG TERM GOAL #5   Title  Pt to improve coordination Rt hand as evidenced by  performing 9 hole peg test in 55 sec. or under    Status  On-going      OT LONG TERM GOAL #6   Title  Pt to perform mid level reaching RUE to retrieve/replace light weight objects on high shelf 5/5 trials    Status  On-going            Plan - 12/21/19 1549    Clinical Impression Statement  Patient has met most short term goals, and is progressing toward remaining goals.  Patient continues to report right UE pain.  He is demonstrating improved performance with all ADL's and functional use of his RUE.  He is agreeable to seeking help for lack of motivation/interest.    OT Frequency  2x / week    OT Duration  8 weeks    OT Treatment/Interventions  Self-care/ADL training;Moist Heat;Fluidtherapy;DME and/or AE instruction;Splinting;Aquatic Therapy;Therapeutic activities;Ultrasound;Therapeutic exercise;Cognitive remediation/compensation;Neuromuscular education;Functional Mobility Training;Passive range of motion;Visual/perceptual remediation/compensation;Electrical Stimulation;Paraffin;Manual Therapy;Patient/family education    Plan  hand to mouth pattern for eating, handwriting, grip strength, functional low reach, supported mid reach    Consulted and Agree with Plan of Care  Patient;Family member/caregiver    Family Member Consulted  wife- [name]       Patient will benefit from skilled therapeutic intervention in order to improve the following deficits and impairments:           Visit Diagnosis: Hemiplegia and hemiparesis following cerebral infarction affecting right dominant side ([place]) - Plan: Ot plan of care cert/re-cert, Ot plan of care cert/re-cert  Unsteadiness on feet - Plan: Ot plan of care cert/re-cert, Ot plan of care cert/re-cert  Other lack of coordination - Plan: Ot plan of care cert/re-cert, Ot plan of care cert/re-cert  Muscle weakness (generalized) - Plan: Ot plan of care cert/re-cert, Ot plan of care cert/re-cert  Pain  in right arm - Plan: Ot plan of care  cert/re-cert, Ot plan of care cert/re-cert  Pain in right hand - Plan: Ot plan of care cert/re-cert, Ot plan of care cert/re-cert  Other symptoms and signs involving cognitive functions following cerebral infarction - Plan: Ot plan of care cert/re-cert, Ot plan of care cert/re-cert    Problem List Patient Active Problem List   Diagnosis Date Noted  . Dysphagia, post-stroke   . Diabetes mellitus type 2 in obese ([place])   . Acute blood loss anemia   . Hypokalemia   . Diabetic peripheral neuropathy ([place])   . Right knee pain   . Acute ischemic right MCA stroke ([place]) 06/07/2019  . AMS (altered mental status)   . Incidental pulmonary nodule, > 69m and < 819m06/23/2020  . CVA (cerebral vascular accident) ([place]06/22/2020  . 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 Onychomycosis 01/11/2015  . Pain in lower limb 01/11/2015  . Hammer toe of right foot 01/02/2015  . Pain in right foot 01/02/2015    GeMariah MillingOTR/L 12/21/2019, 4:17 PM  [facility]hone: [phone] Fax:  [phone]: ThKeijuan SchellhaseRN: [id number] of Birth: 3/May 02, 1941

## 2019-12-21 NOTE — Therapy (Signed)
[facility] [address] [place], [place], [address] Phone: [phone]   Fax:  [phone]  Physical Therapy Treatment  Patient Details  Name: Luke Rivera MRN: [id number] Date of Birth: October 04, 1941 Referring Provider (PT): Frann Rider, NP   Encounter Date: 12/21/2019  PT End of Session - 12/21/19 2113    Visit Number  15    Number of Visits  24    Date for PT Re-Evaluation  01/04/20    Authorization Type  Aetna MCR    PT Start Time  1532    PT Stop Time  1616    PT Time Calculation (min)  44 min    Equipment Utilized During Treatment  Gait belt    Activity Tolerance  Patient tolerated treatment well    Behavior During Therapy  [facility] for tasks assessed/performed       Past Medical History:  Diagnosis Date  . CAD (coronary artery disease)   . CHF (congestive heart failure) ([place])   . Chronic anticoagulation   . DM2 (diabetes mellitus, type 2) ([place])   . DVT (deep vein thrombosis) in pregnancy   . GERD (gastroesophageal reflux disease)   . HTN (hypertension)   . TIA (transient ischemic attack)     History reviewed. No pertinent surgical history.  Vitals:   12/21/19 1539  BP: 114/65  Pulse: 71    Subjective Assessment - 12/21/19 1535    Subjective  No falls. Exercises at home has been going well from the couple that are the most important.    Patient is accompained by:  Family member    How long can you stand comfortably?  10 min    How long can you walk comfortably?  10 min    Patient Stated Goals  get back to where he used to be    Currently in Pain?  No/denies                       [facility] Adult PT Treatment/Exercise - 12/21/19 0001      Transfers   Transfers  Sit to Stand;Stand to Sit    Sit to Stand Details (indicate cue type and reason)  Sit <> stands from elevated mat table with no UE support: 2 x 10 reps, cues for anterior weight shift to prevent BLE bracing initially in  standing, standing upright cues for glute activation before slowly sitting back down to the mat. HR increased to 125 bpm after performing (mid 70s at rest).       Ambulation/Gait   Ambulation/Gait  Yes    Ambulation/Gait Assistance  6: Modified independent (Device/Increase time)    Ambulation/Gait Assistance Details  Cues for posture, staying close to RW    Ambulation Distance (Feet)  100 Feet   throughout session   Assistive device  Rolling walker;Other (Comment)   R AFO   Gait Pattern  Step-through pattern;Decreased stance time - right;Right genu recurvatum    Ambulation Surface  Level;Indoor      Neuro Re-ed    Neuro Re-ed Details   Standing in // bars with use of mirror for visual feedback for posture: narrower BOS on level ground: head turns 2 x 5 reps, wide BOS on foam for balance eyes open 3 x 20-30 seconds - cues for posture. 1 x 5 reps head turns,  1 x 5 reps head nods with cues for posture. Needed 2 episodes of min A for balance as pt with increased forward  flexed posture when performing and tendency to lose balance anteriorly. With B UE support on // bars: hip bumps posteriorly to other / bar > up right posture, cues for glute activation and posture.       Exercises   Exercises  Other Exercises    Other Exercises   Supine hip flexor stretch off mat table: 2 x 45 second reps B, pt with significant hypomobility, with therapist needing to assist pt into position and provide support for LE as pt unable to let gravity assist. Pt takes increased time getting into position. Felt relief in standing/gait out of clinic after performing on both sides.                PT Short Term Goals - 11/09/19 1550      PT SHORT TERM GOAL #1   Title  Pt will be I and compliant with HEP. (STG target date 4 weeks 11/11/19)    Baseline  not consistent yet    Status  On-going      PT SHORT TERM GOAL #2   Title  Pt will improve 5TSTS test to <40 seconds    Baseline  scored 26 seconds when retested  on 12/1    Status  Achieved      PT SHORT TERM GOAL #3   Title  Pt will improve TUG to less than 40 seconds    Baseline  improved to 30 sec on 11/09/19    Status  Achieved      PT SHORT TERM GOAL #4   Title  Pt will improve gait speed to >1 ft/sec with LRAD    Baseline  improved to 1 ft/sec on 11/09/19    Status  Achieved        PT Long Term Goals - 12/21/19 2119      PT LONG TERM GOAL #1   Title  Pt will improve TUG to less than 15 seconds LRAD. ALL LTGS DUE 01/04/20    Time  12    Period  Weeks    Status  New      PT LONG TERM GOAL #2   Title  Pt will improve 5TSTS test to <20 seconds.    Time  12    Period  Weeks    Status  New      PT LONG TERM GOAL #3   Title  Pt will improve gait speed greater than 1.5 ft/sec LRAD    Time  12    Period  Weeks    Status  New      PT LONG TERM GOAL #4   Title  Pt will be able to ambuate at least 750 ft for community ambulaiton mod I with LRAD.    Time  12    Period  Weeks      PT LONG TERM GOAL #5   Title  Pt will be able to negotiate at least one flight of stairs with only one handrail mod I in order to visit with friends/family or have better access to community    Time  [place] - 12/21/19 2115    Clinical Impression Statement  Pt's vitals WFL throughout session today (previous session pt showed signs of possible dehydration with low BP and elevated HR). Continued to educate on importance of staying hydrated. Pt with increased difficulty with dynamic standing  balance with head turns as pt with tendency to lose balance anteriorly and needed 2 episodes of min A. Pt with significant hypomobility of B hip flexors as noted by modified Madhav test stretch off mat table. After session, pt reported relief from stretching when ambulating out of clinic. Will continue to progress towards LTGs.    Personal Factors and Comorbidities  Age;Past/Current Experience    Examination-Activity  Limitations  Bathing;Transfers;Locomotion Level;Reach Overhead;Bed Mobility;Bend;Self Feeding;Carry;Squat;Dressing;Stairs;Hygiene/Grooming;Stand;Lift;Toileting    Examination-Participation Restrictions  Meal Prep;Cleaning;Community Activity;Driving;Shop;Laundry;Yard Work    Merchant navy officer  Evolving/Moderate complexity    Rehab Potential  Good    PT Frequency  2x / week   2-3   PT Duration  12 weeks    PT Treatment/Interventions  ADLs/Self Care Home Management;Aquatic Therapy;Cryotherapy;English as a second language teacher;Therapeutic activities;Therapeutic exercise;Balance training;Neuromuscular re-education;Manual techniques;Orthotic Fit/Training;Patient/family education;Passive range of motion;Dry needling;Energy conservation;Splinting;Joint Manipulations;Taping    PT Next Visit Plan  continue to check vitals (previous session pt's vitals showed possible dehyrdation) Also try not to overload on HEP as patient seems overwhelmed. Add hip flexor stretch to HEP/go over how to safely do at home due to significant hypomobility and pt reporting relief after performing. Hamstring strength to reduce R genu recurvatum in midstance. Needs posture and hip flexor strengthening. Work on Insurance account manager on right for better knee control. has pt followed up with MD regarding depressed affect?    PT Home Exercise Plan  Access Code: [email or web address], added stand hip abd, SL clams, bridge    Consulted and Agree with Plan of Care  Patient       Patient will benefit from skilled therapeutic intervention in order to improve the following deficits and impairments:  Abnormal gait, Decreased activity tolerance, Decreased balance, Decreased coordination, Decreased endurance, Decreased mobility, Decreased range of motion, Decreased strength, Difficulty walking, Hypomobility, Increased fascial restricitons, Increased muscle spasms, Impaired flexibility, Postural  dysfunction, Pain  Visit Diagnosis: Hemiplegia and hemiparesis following cerebral infarction affecting right dominant side (HCC)  Unsteadiness on feet  Other lack of coordination  Muscle weakness (generalized)     Problem List Patient Active Problem List   Diagnosis Date Noted  . Dysphagia, post-stroke   . Diabetes mellitus type 2 in obese ([place])   . Acute blood loss anemia   . Hypokalemia   . Diabetic peripheral neuropathy ([place])   . Right knee pain   . Acute ischemic right MCA stroke ([place]) 06/07/2019  . AMS (altered mental status)   . Incidental pulmonary nodule, > 54mm and < 19mm 06/01/2019  . CVA (cerebral vascular accident) ([place]) 05/31/2019  . 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 Onychomycosis 01/11/2015  . Pain in lower limb 01/11/2015  . Hammer toe of right foot 01/02/2015  . Pain in right foot 01/02/2015    Arliss Journey , PT, DPT  12/21/2019, 9:25 PM  [facility] [address] [place], [place], [address] Phone: [phone]   Fax:  [phone]  Name: Luke Rivera MRN: [id number] Date of Birth: [birth year]

## 2019-12-23 ENCOUNTER — Ambulatory Visit: Payer: Medicare HMO | Admitting: Occupational Therapy

## 2019-12-23 ENCOUNTER — Other Ambulatory Visit: Payer: Self-pay

## 2019-12-23 ENCOUNTER — Encounter: Payer: Self-pay | Admitting: Occupational Therapy

## 2019-12-23 ENCOUNTER — Ambulatory Visit: Payer: Medicare HMO | Admitting: Physical Therapy

## 2019-12-23 DIAGNOSIS — R2681 Unsteadiness on feet: Secondary | ICD-10-CM

## 2019-12-23 DIAGNOSIS — I69351 Hemiplegia and hemiparesis following cerebral infarction affecting right dominant side: Secondary | ICD-10-CM

## 2019-12-23 DIAGNOSIS — M79601 Pain in right arm: Secondary | ICD-10-CM

## 2019-12-23 DIAGNOSIS — R278 Other lack of coordination: Secondary | ICD-10-CM

## 2019-12-23 DIAGNOSIS — M79641 Pain in right hand: Secondary | ICD-10-CM

## 2019-12-23 DIAGNOSIS — R2689 Other abnormalities of gait and mobility: Secondary | ICD-10-CM

## 2019-12-23 DIAGNOSIS — M6281 Muscle weakness (generalized): Secondary | ICD-10-CM

## 2019-12-23 DIAGNOSIS — I69318 Other symptoms and signs involving cognitive functions following cerebral infarction: Secondary | ICD-10-CM

## 2019-12-23 NOTE — Therapy (Signed)
[facility] [address] [place], [place], [address] Phone: [phone]   Fax:  [phone]  Occupational Therapy Treatment  Patient Details  Name: Luke Rivera MRN: [id number] Date of Birth: 01-02-1941 Referring Provider (OT): Frann Rider   Encounter Date: 12/23/2019  OT End of Session - 12/23/19 1543    Visit Number  14    Number of Visits  17    Date for OT Re-Evaluation  12/21/19    Authorization Type  [employer] MCR    Authorization - Visit Number  14    Authorization - Number of Visits  20    OT Start Time  3976    OT Stop Time  1530    OT Time Calculation (min)  45 min    Activity Tolerance  Patient tolerated treatment well    Behavior During Therapy  [facility] for tasks assessed/performed       Past Medical History:  Diagnosis Date  . CAD (coronary artery disease)   . CHF (congestive heart failure) ([place])   . Chronic anticoagulation   . DM2 (diabetes mellitus, type 2) ([place])   . DVT (deep vein thrombosis) in pregnancy   . GERD (gastroesophageal reflux disease)   . HTN (hypertension)   . TIA (transient ischemic attack)     History reviewed. No pertinent surgical history.  There were no vitals filed for this visit.  Subjective Assessment - 12/23/19 1535    Subjective   It just aches    Currently in Pain?  No/denies    Pain Score  0-No pain                   OT Treatments/Exercises (OP) - 12/23/19 0001      Neurological Re-education Exercises   Other Exercises 1  Neuromuscular reeducation to address trunk UE relationship.  Patient with excessive trunk extension, limited flexion, rotation.  Patient with significant proximal weakness which inhibits correct reach pattern, and leads to consistent shoulder pain.  Have taught some compensatory strategies with arm partially in support, but patient unable to consistently carryover at home.  Wife aware, and will work to reinforce    Other Exercises 2  In  sitting worked on postural mobility as related to sit to stand and reach patterns.  Patient does best in regards to pain and active movement with AAROM - Guided motion on moveable surface.               OT Education - 12/23/19 1542    Education Details  reviewed benefit of placing elbow onto surface for distal functioning like eating to prevent shoulder compensations which lead to pain    Person(s) Educated  Patient;Spouse    Methods  Explanation;Demonstration;Verbal cues    Comprehension  Verbalized understanding;Returned demonstration;Need further instruction       OT Short Term Goals - 12/21/19 1606      OT SHORT TERM GOAL #1   Title  Independent with HEP for RUE    Status  Achieved      OT SHORT TERM GOAL #2   Title  Pt to report pain less than or equal to 3/10 RUE for functional ADLS    Status  Achieved      OT SHORT TERM GOAL #3   Title  Pt to write full sentence with 90% or greater legibility    Status  Achieved      OT SHORT TERM GOAL #4   Title  Pt to use  Rt dominant hand for eating 50% of the time and grooming 25% of the time    Status  Achieved      OT SHORT TERM GOAL #5   Title  Pt to improve coordination Rt hand as evidenced by reducing speed on 9 hole peg test to 65 sec. or under    Status  On-going      OT SHORT TERM GOAL #6   Title  Pt to don/doff shoes and socks mod I level with A/E PRN    Status  Partially Met      OT SHORT TERM GOAL #7   Title  Pt to improve Rt shoulder flex to 110* in prep for high level reaching    Status  Achieved        OT Long Term Goals - 12/23/19 1544      OT LONG TERM GOAL #2   Title  Pt to use Rt dominant hand for eating 75% of the time or more, and for grooming 50% of the time or more    Status  On-going      OT LONG TERM GOAL #3   Title  Pt to improve grip strength Rt hand to 30 lbs or greater    Status  On-going      OT LONG TERM GOAL #4   Title  Pt to perform simple snack prep w/ supervision prn    Status   Achieved      OT LONG TERM GOAL #5   Title  Pt to improve coordination Rt hand as evidenced by performing 9 hole peg test in 55 sec. or under    Status  On-going      OT LONG TERM GOAL #6   Title  Pt to perform mid level reaching RUE to retrieve/replace light weight objects on high shelf 5/5 trials    Status  On-going            Plan - 12/23/19 1543    Clinical Impression Statement  Have recertified plan of care to finalize remaining visits of OT.  Patient and wife are agreeable to discharge at end of initially planned visits.    OT Frequency  2x / week    OT Duration  8 weeks    OT Treatment/Interventions  Self-care/ADL training;Moist Heat;Fluidtherapy;DME and/or AE instruction;Splinting;Aquatic Therapy;Therapeutic activities;Ultrasound;Therapeutic exercise;Cognitive remediation/compensation;Neuromuscular education;Functional Mobility Training;Passive range of motion;Visual/perceptual remediation/compensation;Electrical Stimulation;Paraffin;Manual Therapy;Patient/family education    Plan  hand to mouth pattern for eating, handwriting, grip strength, functional low reach, supported mid reach    OT Home Exercise Plan  FM coord, gentle shoulder motion on moveable surface in sitting    Consulted and Agree with Plan of Care  Patient;Family member/caregiver    Family Member Consulted  wife- [name]       Patient will benefit from skilled therapeutic intervention in order to improve the following deficits and impairments:           Visit Diagnosis: Hemiplegia and hemiparesis following cerebral infarction affecting right dominant side (HCC)  Pain in right arm  Pain in right hand  Other symptoms and signs involving cognitive functions following cerebral infarction  Other lack of coordination  Muscle weakness (generalized)  Unsteadiness on feet    Problem List Patient Active Problem List   Diagnosis Date Noted  . Dysphagia, post-stroke   . Diabetes mellitus type 2 in  obese ([place])   . Acute blood loss anemia   . Hypokalemia   . Diabetic peripheral neuropathy ([place])   .  Right knee pain   . Acute ischemic right MCA stroke ([place]) 06/07/2019  . AMS (altered mental status)   . Incidental pulmonary nodule, > 23m and < 831m06/23/2020  . CV[place] (cere[place] accident) ([place]  . 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 Onychomycosis 01/11/2015  . Pain in lower limb 01/11/2015  . Hammer toe of right foot 01/02/2015  . Pain in right foot 01/02/2015    GeMariah MillingOTR/L 12/23/2019, 3:46 PM  [place]one: [phone] Fax:  [phone]: Luke ApreaRN: [id number] of Birth: 3/13-Apr-1941

## 2019-12-23 NOTE — Therapy (Signed)
[facility] [address] [place], [place], [address] Phone: [phone]   Fax:  [phone]  Physical Therapy Treatment  Patient Details  Name: Luke Rivera MRN: [id number] Date of Birth: Dec 28, 1940 Referring Provider (PT): Frann Rider, NP   Encounter Date: 12/23/2019  PT End of Session - 12/23/19 1637    Visit Number  16    Number of Visits  24    Date for PT Re-Evaluation  01/04/20    Authorization Type  [employer]na MCR    PT Start Time  [id number]    PT Stop Time  1620    PT Time Calculation (min)  45 min    Activity Tolerance  Patient tolerated treatment well    Behavior During Therapy  Flat affect       Past Medical History:  Diagnosis Date  . CAD (coronary artery disease)   . CHF (congestive heart failure) ([place])   . Chronic anticoagulation   . DM2 (diabetes mellitus, type 2) ([place])   . DVT (deep vein thrombosis) in pregnancy   . GERD (gastroesophageal reflux disease)   . HTN (hypertension)   . TIA (transient ischemic attack)     No past surgical history on file.  There were no vitals filed for this visit.  Subjective Assessment - 12/23/19 1539    Subjective  L shoulder feels better after OT but it will start hurting again.  Hip flexor stretch helped last time.    Patient is accompained by:  Family member    How long can you stand comfortably?  10 min    How long can you walk comfortably?  10 min    Patient Stated Goals  get back to where he used to be    Currently in Pain?  No/denies                       [facility] Adult PT Treatment/Exercise - 12/23/19 1540      Ambulation/Gait   Ambulation/Gait  Yes    Ambulation/Gait Assistance  4: Min assist    Ambulation/Gait Assistance Details  at counter top - performed 3 sets of forwards and retro gait with LUE support on counter top to simulate only having LUE support.  Therapist provided facilitation of upright posture and shoulder external rotation  and retraction; cues for knee flexion and hip extension on RLE when performing retro gait; facilitation for weight shifting fully to RLE during R stance    Ambulation Distance (Feet)  10 Feet    Assistive device  Other (Comment)   LUE on counter top   Gait Pattern  Step-through pattern;Decreased arm swing - right;Decreased step length - left;Decreased stance time - right;Decreased stride length;Decreased hip/knee flexion - right;Right genu recurvatum;Trunk flexed   RLE external rotation   Ambulation Surface  Level;Indoor      Therapeutic Activites    Therapeutic Activities  Other Therapeutic Activities    Other Therapeutic Activities  discussed patient's goals which include returning to walking with wooden walking stick.  Reports he used to hold the stick in his RUE; discussed that if he were to return to using he would likely need to switch to LUE due to R side being weaker side now.  Discussed all the areas of balance, strength, posture, endurance that would need to be addressed in order to ambulate safely with walking stick.  Advised pt and wife to bring next week and therapy would start to assess and train gait  with walking stick but pt is not appropriate or safe to use at home right now.      Exercises   Exercises  Knee/Hip      Knee/Hip Exercises: Stretches   Hip Flexor Stretch  Right;Left;2 reps;60 seconds    Hip Flexor Stretch Limitations  adjusted stretch to Yasuo test stretch reclined back on mat starting with both feet up.  Kept one knee flexed and dropped other LE off front of mat x 60 seconds with contract relax of hip extensors.      Knee/Hip Exercises: Standing   Hip Abduction  Stengthening;Right;Left;2 sets;10 reps;Knee straight    Abduction Limitations  combined with side stepping with bilat UE support on counter top - used resistance to have pt push into when side stepping for increased weight shift and proximal hip activation to L and R      Knee/Hip Exercises: Seated    Sit to Sand  5 reps;without UE support;with UE support   5 sets with progressive lowering of mat, cues for ant lean            PT Education - 12/23/19 1636    Education Details  see TA    Person(s) Educated  Patient;Spouse    Methods  Explanation    Comprehension  Verbalized understanding       PT Short Term Goals - 11/09/19 1550      PT SHORT TERM GOAL #1   Title  Pt will be I and compliant with HEP. (STG target date 4 weeks 11/11/19)    Baseline  not consistent yet    Status  On-going      PT SHORT TERM GOAL #2   Title  Pt will improve 5TSTS test to <40 seconds    Baseline  scored 26 seconds when retested on 12/1    Status  Achieved      PT SHORT TERM GOAL #3   Title  Pt will improve TUG to less than 40 seconds    Baseline  improved to 30 sec on 11/09/19    Status  Achieved      PT SHORT TERM GOAL #4   Title  Pt will improve gait speed to >1 ft/sec with LRAD    Baseline  improved to 1 ft/sec on 11/09/19    Status  Achieved        PT Long Term Goals - 12/21/19 2119      PT LONG TERM GOAL #1   Title  Pt will improve TUG to less than 15 seconds LRAD. ALL LTGS DUE 01/04/20    Time  12    Period  Weeks    Status  New      PT LONG TERM GOAL #2   Title  Pt will improve 5TSTS test to <20 seconds.    Time  12    Period  Weeks    Status  New      PT LONG TERM GOAL #3   Title  Pt will improve gait speed greater than 1.5 ft/sec LRAD    Time  12    Period  Weeks    Status  New      PT LONG TERM GOAL #4   Title  Pt will be able to ambuate at least 750 ft for community ambulaiton mod I with LRAD.    Time  12    Period  Weeks      PT LONG TERM GOAL #5   Title  Pt  will be able to negotiate at least one flight of stairs with only one handrail mod I in order to visit with friends/family or have better access to community    Time  12    Period  Weeks    Status  New            Plan - 12/23/19 1637    Clinical Impression Statement  Demonstrated another  method for performing hip flexor stretch to prevent pt from switching from side to side; pt felt like it was easier to perform but got a better stretch when lying at the edge of the mat and bringing leg off the side.  Continued sit <> stand training progressively decreasing height of mat to simulate standing from recliner with focus on full anteiror lean.  Discussed pt's personal goals which includes returning to walking with walking stick - performed single hand support at counter for ambulation forwards and retro; maintained bilat UE support for side stepping but added resistance for increased weight shift and mm activation.  Will continue to progress towards LTG.    Personal Factors and Comorbidities  Age;Past/Current Experience    Examination-Activity Limitations  Bathing;Transfers;Locomotion Level;Reach Overhead;Bed Mobility;Bend;Self Feeding;Carry;Squat;Dressing;Stairs;Hygiene/Grooming;Stand;Lift;Toileting    Examination-Participation Restrictions  Meal Prep;Cleaning;Community Activity;Driving;Shop;Laundry;Yard Work    Merchant [employer] officer  Evolving/Moderate complexity    Rehab Potential  Good    PT Frequency  2x / week   2-3   PT Duration  12 weeks    PT Treatment/Interventions  ADLs/Self Care Home Management;Aquatic Therapy;Cryotherapy;English as a second language teacher;Therapeutic activities;Therapeutic exercise;Balance training;Neuromuscular re-education;Manual techniques;Orthotic Fit/Training;Patient/family education;Passive range of motion;Dry needling;Energy conservation;Splinting;Joint Manipulations;Taping    PT Next Visit Plan  LTG due on 1/26.  Pt to bring in walking stick from home to begin to assess and train gait - used to hold in RUE, may need to switch to LUE; stair negotiation training to enter/exit children's homes; Also try not to overload on HEP as patient seems overwhelmed. Hamstring strength to reduce R genu recurvatum in  midstance. Needs posture and hip flexor strengthening. Work on Insurance account manager on right for better knee control. has pt followed up with MD regarding depressed affect?    PT Home Exercise Plan  Access Code: [email or web address], added stand hip abd, SL clams, bridge    Consulted and Agree with Plan of Care  Patient       Patient will benefit from skilled therapeutic intervention in order to improve the following deficits and impairments:  Abnormal gait, Decreased activity tolerance, Decreased balance, Decreased coordination, Decreased endurance, Decreased mobility, Decreased range of motion, Decreased strength, Difficulty walking, Hypomobility, Increased fascial restricitons, Increased muscle spasms, Impaired flexibility, Postural dysfunction, Pain  Visit Diagnosis: Hemiplegia and hemiparesis following cerebral infarction affecting right dominant side (HCC)  Unsteadiness on feet  Muscle weakness (generalized)  Other abnormalities of gait and mobility     Problem List Patient Active Problem List   Diagnosis Date Noted  . Dysphagia, post-stroke   . Diabetes mellitus type 2 in obese ([place])   . Acute blood loss anemia   . Hypokalemia   . Diabetic peripheral neuropathy ([place])   . Right knee pain   . Acute ischemic right MCA stroke ([place]) 06/07/2019  . AMS (altered mental status)   . Incidental pulmonary nodule, > 69mm and < 32mm 06/01/2019  . CVA (cerebral vascular accident) ([place]) 05/31/2019  . 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 Onychomycosis 01/11/2015  . Pain in lower limb 01/11/2015  .  Hammer toe of right foot 01/02/2015  . Pain in right foot 01/02/2015    Rico Junker, PT, DPT 12/23/19    4:43 PM    [facility] [address] [place], [place], [address] Phone: [phone]   Fax:  [phone]  Name: Bibb Jirsa MRN: [id number] Date of Birth: 10/21/1941

## 2019-12-30 ENCOUNTER — Ambulatory Visit: Payer: Medicare HMO | Admitting: Rehabilitation

## 2019-12-30 ENCOUNTER — Encounter: Payer: Self-pay | Admitting: Rehabilitation

## 2019-12-30 ENCOUNTER — Ambulatory Visit: Payer: Medicare HMO | Admitting: Occupational Therapy

## 2019-12-30 ENCOUNTER — Other Ambulatory Visit: Payer: Self-pay

## 2019-12-30 DIAGNOSIS — I69351 Hemiplegia and hemiparesis following cerebral infarction affecting right dominant side: Secondary | ICD-10-CM | POA: Diagnosis not present

## 2019-12-30 DIAGNOSIS — R2681 Unsteadiness on feet: Secondary | ICD-10-CM

## 2019-12-30 DIAGNOSIS — R278 Other lack of coordination: Secondary | ICD-10-CM

## 2019-12-30 DIAGNOSIS — M6281 Muscle weakness (generalized): Secondary | ICD-10-CM

## 2019-12-30 DIAGNOSIS — R2689 Other abnormalities of gait and mobility: Secondary | ICD-10-CM

## 2019-12-30 NOTE — Therapy (Signed)
[facility] [address] [place], [place], [address] Phone: [phone]   Fax:  [phone]  Physical Therapy Treatment  Patient Details  Name: Luke Rivera MRN: [id number] Date of Birth: August 01, 1941 Referring Provider (PT): Frann Rider, NP   Encounter Date: 12/30/2019  PT End of Session - 12/30/19 1758    Visit Number  17    Number of Visits  24    Date for PT Re-Evaluation  01/04/20    Authorization Type  [employer] MCR    PT Start Time  1708   pt late from previous session   PT Stop Time  1745    PT Time Calculation (min)  37 min    Equipment Utilized During Treatment  Gait belt    Activity Tolerance  Patient tolerated treatment well    Behavior During Therapy  Flat affect       Past Medical History:  Diagnosis Date  . CAD (coronary artery disease)   . CHF (congestive heart failure) ([place])   . Chronic anticoagulation   . DM2 (diabetes mellitus, type 2) ([place])   . DVT (deep vein thrombosis) in pregnancy   . GERD (gastroesophageal reflux disease)   . HTN (hypertension)   . TIA (transient ischemic attack)     History reviewed. No pertinent surgical history.  There were no vitals filed for this visit.  Subjective Assessment - 12/30/19 1713    Subjective  Still has L shoulder pain    Patient is accompained by:  Family member    How long can you stand comfortably?  10 min    Patient Stated Goals  get back to where he used to be    Currently in Pain?  No/denies    Pain Score  4     Pain Location  Shoulder    Pain Orientation  Right    Pain Descriptors / Indicators  Aching    Pain Type  Chronic pain    Pain Onset  More than a month ago    Pain Frequency  Intermittent    Aggravating Factors   movement, poor positioning    Pain Relieving Factors  tylenol, topical rubs                       [facility] Adult PT Treatment/Exercise - 12/30/19 1750      Transfers   Transfers  Sit to Stand;Stand to  Sit    Sit to Stand  5: Supervision    Stand to Sit  5: Supervision    Comments  Cues for increased forward weight shift from low mat      Ambulation/Gait   Ambulation/Gait  Yes    Ambulation/Gait Assistance  4: Min assist    Ambulation/Gait Assistance Details  Pt did not bring walking stick to session today, therefore trialed use of trekking pole as close simulation.  Cues for sequencing and technique, however this caused marked increase in gait compensations with increased RLE ER, increased knee recurvatum, and increased R lateral trunk lean.  Cues for larger R step length and increased R hip protraction during stance which improved some, but still poses increased fall risk.  Also trialed use of quad tip cane (both in LUE) with marked improvement in stability but needed continued cues for increased R hip protraction.  Pt did NOT like nor want to use the quad tip cane and states that he would either use walker or walking stick. PT recommends walker at  this time for safety but also to improve gait mechanics and quality.  Pt and wife verbalized understanding.     Ambulation Distance (Feet)  80 Feet   115' then another 35'   Assistive device  Rolling walker;Straight cane   quad tip and trekking pole   Gait Pattern  Step-through pattern;Decreased arm swing - right;Decreased step length - left;Decreased stance time - right;Decreased stride length;Decreased hip/knee flexion - right;Right genu recurvatum;Trunk flexed    Ambulation Surface  Level;Indoor      Self-Care   Self-Care  Other Self-Care Comments    Other Self-Care Comments   Pt reporting wanting to be able to don his R shoe at beginning of session.  PT removed shoe and loosened laces in order to problem solve through how to help him become more independent with task.  Pt able to cross R LE over LLE and don shoe/AFO independently today and able to lace shoe laces.        Exercises   Exercises  Knee/Hip    Other Exercises   Performed standing  hip abd as in HEP.   Pt reports not being very compliant with HEP and therefore tried to simplify routine.  He reports to be more inclined to do standing exercises, therefore encouraged those at least once a day and to incorporate them into his daily activities such as being in the kitchen for coffee or lunch/dinner.  Provided cues to wife to cue pt for tightening R hip during R stance.  Performed x 10 reps on each side with cues for posture and technique.               PT Education - 12/30/19 1758    Education Details  see gait and therex section    Person(s) Educated  Patient;Spouse    Methods  Explanation;Demonstration    Comprehension  Verbalized understanding;Returned demonstration       PT Short Term Goals - 11/09/19 1550      PT SHORT TERM GOAL #1   Title  Pt will be I and compliant with HEP. (STG target date 4 weeks 11/11/19)    Baseline  not consistent yet    Status  On-going      PT SHORT TERM GOAL #2   Title  Pt will improve 5TSTS test to <40 seconds    Baseline  scored 26 seconds when retested on 12/1    Status  Achieved      PT SHORT TERM GOAL #3   Title  Pt will improve TUG to less than 40 seconds    Baseline  improved to 30 sec on 11/09/19    Status  Achieved      PT SHORT TERM GOAL #4   Title  Pt will improve gait speed to >1 ft/sec with LRAD    Baseline  improved to 1 ft/sec on 11/09/19    Status  Achieved        PT Long Term Goals - 12/21/19 2119      PT LONG TERM GOAL #1   Title  Pt will improve TUG to less than 15 seconds LRAD. ALL LTGS DUE 01/04/20    Time  12    Period  Weeks    Status  New      PT LONG TERM GOAL #2   Title  Pt will improve 5TSTS test to <20 seconds.    Time  12    Period  Weeks    Status  New  PT LONG TERM GOAL #3   Title  Pt will improve gait speed greater than 1.5 ft/sec LRAD    Time  12    Period  Weeks    Status  New      PT LONG TERM GOAL #4   Title  Pt will be able to ambuate at least 750 ft for community  ambulaiton mod I with LRAD.    Time  12    Period  Weeks      PT LONG TERM GOAL #5   Title  Pt will be able to negotiate at least one flight of stairs with only one handrail mod I in order to visit with friends/family or have better access to community    Time  12    Period  Weeks    Status  New            Plan - 12/30/19 1759    Clinical Impression Statement  Skilled session focused on simulating walking stick with gait and trekking pole vs SPC with quad tip.  He demo's improved stability with quad tip cane, however does not wish to pursue this, therefore would continue to recommend RW for improved safety, stability, and improved quality of gait.  Also encourage daily standing exercises to improve hip strength.    Personal Factors and Comorbidities  Age;Past/Current Experience    Examination-Activity Limitations  Bathing;Transfers;Locomotion Level;Reach Overhead;Bed Mobility;Bend;Self Feeding;Carry;Squat;Dressing;Stairs;Hygiene/Grooming;Stand;Lift;Toileting    Examination-Participation Restrictions  Meal Prep;Cleaning;Community Activity;Driving;Shop;Laundry;Yard Work    Merchant navy officer  Evolving/Moderate complexity    Rehab Potential  Good    PT Frequency  2x / week   2-3   PT Duration  12 weeks    PT Treatment/Interventions  ADLs/Self Care Home Management;Aquatic Therapy;Cryotherapy;English as a second language teacher;Therapeutic activities;Therapeutic exercise;Balance training;Neuromuscular re-education;Manual techniques;Orthotic Fit/Training;Patient/family education;Passive range of motion;Dry needling;Energy conservation;Splinting;Joint Manipulations;Taping    PT Next Visit Plan  LTG due on 1/26.  Pt to bring in walking stick from home to begin to assess and train gait - used to hold in RUE, may need to switch to LUE; stair negotiation training to enter/exit children's homes; Also try not to overload on HEP as patient seems  overwhelmed. Hamstring strength to reduce R genu recurvatum in midstance. Needs posture and hip flexor strengthening. Work on Insurance account manager on right for better knee control. has pt followed up with MD regarding depressed affect?    PT Home Exercise Plan  Access Code: [email or web address], added stand hip abd, SL clams, bridge    Consulted and Agree with Plan of Care  Patient       Patient will benefit from skilled therapeutic intervention in order to improve the following deficits and impairments:  Abnormal gait, Decreased activity tolerance, Decreased balance, Decreased coordination, Decreased endurance, Decreased mobility, Decreased range of motion, Decreased strength, Difficulty walking, Hypomobility, Increased fascial restricitons, Increased muscle spasms, Impaired flexibility, Postural dysfunction, Pain  Visit Diagnosis: Hemiplegia and hemiparesis following cerebral infarction affecting right dominant side (HCC)  Other abnormalities of gait and mobility  Unsteadiness on feet  Muscle weakness (generalized)     Problem List Patient Active Problem List   Diagnosis Date Noted  . Dysphagia, post-stroke   . Diabetes mellitus type 2 in obese ([place])   . Acute blood loss anemia   . Hypokalemia   . Diabetic peripheral neuropathy ([place])   . Right knee pain   . 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 AMS (altered mental status)   . Incidental pulmonary nodule, >  60mm and < 24mm 06/01/2019  . CVA (cerebral vascular accident) ([place]) 05/31/2019  . 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 Onychomycosis 01/11/2015  . Pain in lower limb 01/11/2015  . Hammer toe of right foot 01/02/2015  . Pain in right foot 01/02/2015    Cameron Sprang, PT, MPT [facility] [address] [place], [place], [address] Phone: [phone]   Fax:  [phone] 12/30/19, 6:02 PM  Name: Xaver Hames MRN: [id number] Date of Birth:  1941-10-30

## 2019-12-30 NOTE — Therapy (Signed)
[facility] [address] [place], [place], [address] Phone: [phone]   Fax:  [phone]  Occupational Therapy Treatment  Patient Details  Name: Luke Rivera MRN: [id number] Date of Birth: [birth year] Referring Provider (OT): Frann Rider   Encounter Date: 12/30/2019  OT End of Session - 12/30/19 1832    Visit Number  15    Number of Visits  17    Date for OT Re-Evaluation  12/21/19    Authorization Type  [employer]etna MCR    Authorization - Visit Number  15    Authorization - Number of Visits  20    OT Start Time  5397    OT Stop Time  1830    OT Time Calculation (min)  45 min    Activity Tolerance  Patient tolerated treatment well    Behavior During Therapy  Flat affect       Past Medical History:  Diagnosis Date  . CAD (coronary artery disease)   . CHF (congestive heart failure) ([employer])   . Chronic anticoagulation   . DM2 (diabetes mellitus, type 2) ([place])   . DVT (deep vein thrombosis) in pregnancy   . GERD (gastroesophageal reflux disease)   . HTN (hypertension)   . TIA (transient ischemic attack)     No past surgical history on file.  There were no vitals filed for this visit.  Subjective Assessment - 12/30/19 1750    Patient is accompanied by:  Family member   wife   Pertinent History  Lt MCA CVA 05/31/19 w/ residual Rt sh/hand syndrome. PMH: CAD, Chronic A-fib, DM2, CHF, HTN, peripheral neuropathy    Limitations  no driving    Currently in Pain?  No/denies       Assessed progress towards goals - see goal section.  Began in hand manipulation with use of checkers (translating fingertips to/from palm) and rotating high lighter between fingers. Pt practiced flipping wooden dowel pegs over in pegboard.  Continued to reinforce normal movement patterns - pt can demo open chain however reverts pack into sh abduction and IR w/ functional tasks.  Pt shown ex to do w/ cane for AA/ROM sh flexion seated. Wife to  assist w/ this. Pt/wife verbalized understanding                      OT Short Term Goals - 12/30/19 1833      OT SHORT TERM GOAL #1   Title  Independent with HEP for RUE    Status  Achieved      OT SHORT TERM GOAL #2   Title  Pt to report pain less than or equal to 3/10 RUE for functional ADLS    Status  Achieved      OT SHORT TERM GOAL #3   Title  Pt to write full sentence with 90% or greater legibility    Status  Achieved      OT SHORT TERM GOAL #4   Title  Pt to use Rt dominant hand for eating 50% of the time and grooming 25% of the time    Status  Achieved      OT SHORT TERM GOAL #5   Title  Pt to improve coordination Rt hand as evidenced by reducing speed on 9 hole peg test to 65 sec. or under    Status  Achieved   65 sec     OT SHORT TERM GOAL #6   Title  Pt to don/doff  shoes and socks mod I level with A/E PRN    Status  Achieved      OT SHORT TERM GOAL #7   Title  Pt to improve Rt shoulder flex to 110* in prep for high level reaching    Status  Achieved   w/ compensations       OT Long Term Goals - 12/30/19 1834      OT LONG TERM GOAL #1   Title  Independent with updated HEP    Status  Achieved      OT LONG TERM GOAL #2   Title  Pt to use Rt dominant hand for eating 75% of the time or more, and for grooming 50% of the time or more    Status  Not Met   50% eating, 25% grooming     OT LONG TERM GOAL #3   Title  Pt to improve grip strength Rt hand to 30 lbs or greater    Status  Not Met   25 lbs     OT LONG TERM GOAL #4   Title  Pt to perform simple snack prep w/ supervision prn    Status  Achieved      OT LONG TERM GOAL #5   Title  Pt to improve coordination Rt hand as evidenced by performing 9 hole peg test in 55 sec. or under    Status  Not Met   65 sec     OT LONG TERM GOAL #6   Title  Pt to perform mid level reaching RUE to retrieve/replace light weight objects on high shelf 5/5 trials    Status  On-going             Plan - 12/30/19 1835    Clinical Impression Statement  Pt w/ improved coordination Rt hand. Pt also able to perform simple in hand manipulation with larger objects.    Occupational performance deficits (Please refer to evaluation for details):  ADL's;IADL's    Body Structure / Function / Physical Skills  ADL;Strength;Pain;Tone;Proprioception;UE functional use;IADL;ROM;Sensation;Coordination;Flexibility;Mobility;FMC    Rehab Potential  Good    OT Frequency  2x / week    OT Duration  8 weeks    OT Treatment/Interventions  Self-care/ADL training;Moist Heat;Fluidtherapy;DME and/or AE instruction;Splinting;Aquatic Therapy;Therapeutic activities;Ultrasound;Therapeutic exercise;Cognitive remediation/compensation;Neuromuscular education;Functional Mobility Training;Passive range of motion;Visual/perceptual remediation/compensation;Electrical Stimulation;Paraffin;Manual Therapy;Patient/family education    Plan  hand to mouth pattern for eating, handwriting, grip strength, functional low reach, supported mid reach, d/c end of next week    Consulted and Agree with Plan of Care  Patient;Family member/caregiver    Family Member Consulted  wife- [name]       Patient will benefit from skilled therapeutic intervention in order to improve the following deficits and impairments:   Body Structure / Function / Physical Skills: ADL, Strength, Pain, Tone, Proprioception, UE functional use, IADL, ROM, Sensation, Coordination, Flexibility, Mobility, FMC       Visit Diagnosis: Hemiplegia and hemiparesis following cerebral infarction affecting right dominant side (HCC)  Other lack of coordination    Problem List Patient Active Problem List   Diagnosis Date Noted  . Dysphagia, post-stroke   . Diabetes mellitus type 2 in obese ([place])   . Acute blood loss anemia   . Hypokalemia   . Diabetic peripheral neuropathy ([place])   . Right knee pain   . Acute ischemic right MCA stroke ([place]) 06/07/2019  .  AMS (altered mental status)   . Incidental pulmonary nodule, > 45m and < 850m  06/01/2019  . CVA (cerebral vascular accident) ([place]) 05/31/2019  . 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 Onychomycosis 01/11/2015  . Pain in lower limb 01/11/2015  . Hammer toe of right foot 01/02/2015  . Pain in right foot 01/02/2015    Carey Bullocks, OTR/L 12/30/2019, 6:37 PM  [facility] [address] [place], [place], [address] Phone: [phone]   Fax:  [phone]  Name: Anthoney Sheppard MRN: [id number] Date of Birth: 24-Oct-1941

## 2019-12-31 ENCOUNTER — Ambulatory Visit: Payer: Medicare HMO | Admitting: Physical Therapy

## 2019-12-31 ENCOUNTER — Ambulatory Visit: Payer: Medicare HMO | Admitting: Occupational Therapy

## 2019-12-31 ENCOUNTER — Encounter: Payer: Self-pay | Admitting: Occupational Therapy

## 2019-12-31 DIAGNOSIS — M6281 Muscle weakness (generalized): Secondary | ICD-10-CM

## 2019-12-31 DIAGNOSIS — I69351 Hemiplegia and hemiparesis following cerebral infarction affecting right dominant side: Secondary | ICD-10-CM

## 2019-12-31 DIAGNOSIS — R2681 Unsteadiness on feet: Secondary | ICD-10-CM

## 2019-12-31 DIAGNOSIS — I69318 Other symptoms and signs involving cognitive functions following cerebral infarction: Secondary | ICD-10-CM

## 2019-12-31 DIAGNOSIS — M79601 Pain in right arm: Secondary | ICD-10-CM

## 2019-12-31 DIAGNOSIS — R278 Other lack of coordination: Secondary | ICD-10-CM

## 2019-12-31 DIAGNOSIS — R2689 Other abnormalities of gait and mobility: Secondary | ICD-10-CM

## 2019-12-31 NOTE — Patient Instructions (Signed)
Seated shoulder exercises  Sitting in bedroom chair, using walking stick Place right hand at shoulder height of walking stick.   Sit up straight so shoulders are over hips, and chest is lifted.    Forward: Straighten right arm untilelbow is extended, than lean forward to move stick even more.   Point of stick will stay stable on floor 10 x times 3 repetitions  Lateral movement Keeping elbow straight move stick toward right side - large movement Move arm across body, slowly and stay within pain free ranges.

## 2019-12-31 NOTE — Therapy (Signed)
[facility] [address] [place], [place], [address] Phone: [phone]   Fax:  [phone]  Occupational Therapy Treatment  Patient Details  Name: Luke Rivera MRN: [id number] Date of Birth: 09-15-1941 Referring Provider (OT): Frann Rider   Encounter Date: 12/31/2019  OT End of Session - 12/31/19 1252    Visit Number  16    Number of Visits  17    Authorization Type  Aetna MCR    Authorization - Visit Number  16    Authorization - Number of Visits  20    OT Start Time  4481    OT Stop Time  1230    OT Time Calculation (min)  45 min    Activity Tolerance  Patient tolerated treatment well    Behavior During Therapy  [facility] for tasks assessed/performed       Past Medical History:  Diagnosis Date  . CAD (coronary artery disease)   . CHF (congestive heart failure) ([place])   . Chronic anticoagulation   . DM2 (diabetes mellitus, type 2) ([place])   . DVT (deep vein thrombosis) in pregnancy   . GERD (gastroesophageal reflux disease)   . HTN (hypertension)   . TIA (transient ischemic attack)     History reviewed. No pertinent surgical history.  There were no vitals filed for this visit.  Subjective Assessment - 12/31/19 1151    Subjective   I am getting better    Currently in Pain?  No/denies    Pain Score  0-No pain                   OT Treatments/Exercises (OP) - 12/31/19 1246      Neurological Re-education Exercises   Other Exercises 1  Upgraded HEP to include seated assisted shoulder exercises, with emphasis on postural activation and correct mechanics for forward and lateral reach.  Used mirror for visual feedback, and included wife, who today could identify proper postural position and correct adaptations.      Other Exercises 2  Worked on hand strengthening via digiflex 1.5, and 3.0 lbs.  Patient reports he is not using putty.  Reinforced that putty exercises may help both stiffness and strength  problem in right hand.               OT Education - 12/31/19 1252    Education Details  Updated shoulder HEP    Person(s) Educated  Patient;Spouse    Methods  Explanation;Demonstration;Tactile cues;Verbal cues;Handout    Comprehension  Verbalized understanding;Returned demonstration       OT Short Term Goals - 12/30/19 1833      OT SHORT TERM GOAL #1   Title  Independent with HEP for RUE    Status  Achieved      OT SHORT TERM GOAL #2   Title  Pt to report pain less than or equal to 3/10 RUE for functional ADLS    Status  Achieved      OT SHORT TERM GOAL #3   Title  Pt to write full sentence with 90% or greater legibility    Status  Achieved      OT SHORT TERM GOAL #4   Title  Pt to use Rt dominant hand for eating 50% of the time and grooming 25% of the time    Status  Achieved      OT SHORT TERM GOAL #5   Title  Pt to improve coordination Rt hand as evidenced by reducing  speed on 9 hole peg test to 65 sec. or under    Status  Achieved   65 sec     OT SHORT TERM GOAL #6   Title  Pt to don/doff shoes and socks mod I level with A/E PRN    Status  Achieved      OT SHORT TERM GOAL #7   Title  Pt to improve Rt shoulder flex to 110* in prep for high level reaching    Status  Achieved   w/ compensations       OT Long Term Goals - 12/30/19 1834      OT LONG TERM GOAL #1   Title  Independent with updated HEP    Status  Achieved      OT LONG TERM GOAL #2   Title  Pt to use Rt dominant hand for eating 75% of the time or more, and for grooming 50% of the time or more    Status  Not Met   50% eating, 25% grooming     OT LONG TERM GOAL #3   Title  Pt to improve grip strength Rt hand to 30 lbs or greater    Status  Not Met   25 lbs     OT LONG TERM GOAL #4   Title  Pt to perform simple snack prep w/ supervision prn    Status  Achieved      OT LONG TERM GOAL #5   Title  Pt to improve coordination Rt hand as evidenced by performing 9 hole peg test in 55 sec. or  under    Status  Not Met   65 sec     OT LONG TERM GOAL #6   Title  Pt to perform mid level reaching RUE to retrieve/replace light weight objects on high shelf 5/5 trials    Status  On-going            Plan - 12/31/19 1253    Clinical Impression Statement  Patient showing overall improved use of RUE, and reported less pain this session in right arm.  Patient to see Dr Letta Pate early next month to address ongoing RUE pain.    OT Frequency  2x / week    OT Duration  8 weeks    OT Treatment/Interventions  Self-care/ADL training;Moist Heat;Fluidtherapy;DME and/or AE instruction;Splinting;Aquatic Therapy;Therapeutic activities;Ultrasound;Therapeutic exercise;Cognitive remediation/compensation;Neuromuscular education;Functional Mobility Training;Passive range of motion;Visual/perceptual remediation/compensation;Electrical Stimulation;Paraffin;Manual Therapy;Patient/family education    Plan  discharge - address remaining goals    OT Home Exercise Plan  FM coord, gentle shoulder motion on moveable surface in sitting    Consulted and Agree with Plan of Care  Patient;Family member/caregiver    Family Member Consulted  wife- [name]       Patient will benefit from skilled therapeutic intervention in order to improve the following deficits and impairments:           Visit Diagnosis: Hemiplegia and hemiparesis following cerebral infarction affecting right dominant side (HCC)  Muscle weakness (generalized)  Unsteadiness on feet  Other lack of coordination  Pain in right arm  Other symptoms and signs involving cognitive functions following cerebral infarction    Problem List Patient Active Problem List   Diagnosis Date Noted  . Dysphagia, post-stroke   . Diabetes mellitus type 2 in obese ([place])   . Acute blood loss anemia   . Hypokalemia   . Diabetic peripheral neuropathy ([place])   . Right knee pain   . Acute ischemic right MCA  stroke ([place]) 06/07/2019  . AMS (altered mental  status)   . Incidental pulmonary nodule, > 33m and < 830m06/23/2020  . [place] ([place]) ([place]06/[place]/2020  . 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 Onychomycosis 01/11/2015  . Pain in lower limb 01/11/2015  . Hammer toe of right foot 01/02/2015  . Pain in right foot 01/02/2015    GeMariah MillingOTR/L 12/31/2019, 12:55 PM  [facility]: [phone] Fax:  [phone]: Luke FriesenRN: [id number] of Birth: 3/29-Dec-1940

## 2020-01-02 NOTE — Therapy (Signed)
[facility] [address] [place], [place], [address] Phone: [phone]   Fax:  [phone]  Physical Therapy Treatment/Re-Cert  Patient Details  Name: Luke Rivera MRN: [id number] Date of Birth: Oct 01, 1941 Referring Provider (PT): Frann Rider, NP   Encounter Date: 12/31/2019  PT End of Session - 01/02/20 1709    Visit Number  18    Number of Visits  24    Date for PT Re-Evaluation  03/02/20   written for 4-5 week POC   Authorization Type  [employer]etna MCR    PT Start Time  1102    PT Stop Time  1146    PT Time Calculation (min)  44 min    Equipment Utilized During Treatment  Gait belt    Activity Tolerance  Patient tolerated treatment well    Behavior During Therapy  Flat affect       Past Medical History:  Diagnosis Date  . CAD (coronary artery disease)   . CHF (congestive heart failure) ([place])   . Chronic anticoagulation   . DM2 (diabetes mellitus, type 2) ([place])   . DVT (deep vein thrombosis) in pregnancy   . GERD (gastroesophageal reflux disease)   . HTN (hypertension)   . TIA (transient ischemic attack)     No past surgical history on file.  There were no vitals filed for this visit.             12/31/19 1122  Transfers  Sit to Stand 4: Min guard;5: Supervision  Sit to Stand Details Verbal cues for sequencing;Verbal cues for technique;Tactile cues for placement;Tactile cues for weight shifting;Tactile cues for sequencing;Manual facilitation for weight bearing  Sit to Stand Details (indicate cue type and reason) cues for anterior weight shift  Five time sit to stand comments  26.14 seconds with BUE support from chair, 2nd rep 20.65 seconds but with pt not fully standing for 2 out of 5 reps.     Stand to Sit 5: Supervision  Stand to Sit Details poor eccentric control when fatigued  Ambulation/Gait  Ambulation/Gait Yes  Ambulation/Gait Assistance 5: Supervision  Ambulation/Gait Assistance Details  cues for posture and to stay close to RW  Ambulation Distance (Feet) 115 Feet  Assistive device Rolling walker  Gait Pattern Step-through pattern;Decreased arm swing - right;Decreased step length - left;Decreased stance time - right;Decreased stride length;Decreased hip/knee flexion - right;Right genu recurvatum;Trunk flexed  Ambulation Surface Level;Indoor  Gait velocity 33.84 seconds = .97 ft/sec  Stairs Yes  Stairs Assistance 4: Min assist  Stairs Assistance Details (indicate cue type and reason) increased R shoulder pain while descending. needed initial cues for proper sequencing when descending.    Stair Management Technique Two rails;Step to pattern;Forwards  Number of Stairs 8  Height of Stairs 6  Gait Comments pt brought in his walking stick today from home, due to fall risk with RW, educated pt that safest method of ambulating at this time is to use RW. from trialing trekking poles/quad cane at previous sesion, pt demonstrating increased gait abnromalities  Exercises  Other Exercises  verbally reviewed pt's HEP via [place], pt states to be performing all exercises at home. [name] asking what kind of seated exercises pt can do while sitting in his recliner - demonstrated long arc quads and seated marching with pt verbalizing understanding. Will benefit from further review of HEP at next session. educated pt and wife on importance of performing at home when not in therapy so pt can progress towards  functional strength and walking goals in PT                    PT Education - 01/02/20 1708    Education Details  assessing LTGs, POC    Person(s) Educated  Patient;Spouse    Methods  Explanation    Comprehension  Verbalized understanding       PT Short Term Goals - 11/09/19 1550      PT SHORT TERM GOAL #1   Title  Pt will be I and compliant with HEP. (STG target date 4 weeks 11/11/19)    Baseline  not consistent yet    Status  On-going      PT SHORT TERM GOAL #2    Title  Pt will improve 5TSTS test to <40 seconds    Baseline  scored 26 seconds when retested on 12/1    Status  Achieved      PT SHORT TERM GOAL #3   Title  Pt will improve TUG to less than 40 seconds    Baseline  improved to 30 sec on 11/09/19    Status  Achieved      PT SHORT TERM GOAL #4   Title  Pt will improve gait speed to >1 ft/sec with LRAD    Baseline  improved to 1 ft/sec on 11/09/19    Status  Achieved        PT Long Term Goals - 12/31/19 1121      PT LONG TERM GOAL #1   Title  Pt will improve TUG to less than 15 seconds LRAD. ALL LTGS DUE 01/04/20    Baseline  24.97 seconds on 1/22/1 with RW    Time  12    Period  Weeks    Status  Not Met      PT LONG TERM GOAL #2   Title  Pt will improve 5TSTS test to <20 seconds.    Baseline  26.14 seconds with BUE support from chair    Time  12    Period  Weeks    Status  Not Met      PT LONG TERM GOAL #3   Title  Pt will improve gait speed greater than 1.5 ft/sec LRAD    Baseline  33.84 seconds = .97 ft/sec    Time  12    Period  Weeks    Status  Not Met      PT LONG TERM GOAL #4   Title  Pt will be able to ambuate at least 750 ft for community ambulaiton mod I with LRAD.    Baseline  unable to assess due to time constraints.    Time  12    Period  Weeks      PT LONG TERM GOAL #5   Title  Pt will be able to negotiate at least one flight of stairs with only one handrail mod I in order to visit with friends/family or have better access to community    Baseline  8 steps with B handrail with min A/min guard and step to pattern    Time  12    Period  Weeks    Status  Not Met       Revised LTGs for re-cert:    PT Long Term Goals - 01/02/20 1746      PT LONG TERM GOAL #1   Title  Pt will improve TUG to less than 20 seconds with RW to decrease fall risk.  ALL LTGS DUE 01/30/20    Baseline  24.97 seconds on 1/22/1 with RW    Time  4    Period  Weeks    Status  Revised    Target Date  01/30/20      PT LONG TERM  GOAL #2   Title  Pt will improve 5TSTS test from standard height chair with BUE support to 22 seconds or less to demo improved functional LE strength.    Baseline  26.14 seconds with BUE support from chair    Time  4    Period  Weeks    Status  Revised      PT LONG TERM GOAL #3   Title  Pt will improve gait speed greater than 1.3 ft/sec with RW to improve community mobility.    Baseline  33.84 seconds = .97 ft/sec    Time  4    Period  Weeks    Status  Revised      PT LONG TERM GOAL #4   Title  Pt will be independent with final HEP in order to build upon functional gains made in therapy.    Time  4    Period  Weeks    Status  New      PT LONG TERM GOAL #5   Title  Pt will be able to negotiate at least one flight of stairs with only one handrail vs. B handrails using step to pattern and supversion in order to visit with friends/family or have better access to community    Baseline  8 steps with B handrail with min A/min guard and step to pattern    Time  4    Period  Weeks    Status  Revised           01/02/20 1710  Plan  Clinical Impression Statement Focus of today's skilled session was assessing pt's LTGs for re-cert. Pt did not meet 4 out of 5 LTGs in regards to TUG, gait speed, 5x sit <> stand, and stairs. Pt's TUG, gait speed, and 5x sit <> stand measures indicate that pt is at a high fall risk.  Pt's gait speed with RW at 1 ft/sec has not changed since last time it was assessed, as well as pt's 5x sit <> stand time using BUE from standard chair. Unable to assess remaining LTG due to time constraints. Educated pt that use of RW is the safest AD for pt to use vs. walking stick (what pt used prior to CVA). Verbally reviewed pt's HEP today with pt and pt's spouse - pt reporting that he is performing all of the exercises at home. Will review and have pt demo at next session and to update MedBridge HEP to final condensed version. Educated pt on importance of performing exercises at  home to demonstrate progress in PT.  Pt reports that his goals going forward will be to work on stair training to safely enter/exit pt's childrens homes(will need more clarification on how many steps/railings etc). Due to pt being discharged from OT at next session and due to co-pay, will plan for 1x week for 4-5 weeks with focus on functional LE strengthening, decreasing fall risk, stair training, and gait training to improve functional mobility and pt's independence. Revised pt's LTGs as appropriate.  Personal Factors and Comorbidities Age;Past/Current Experience  Examination-Activity Limitations Bathing;Transfers;Locomotion Level;Reach Overhead;Bed Mobility;Bend;Self Feeding;Carry;Squat;Dressing;Stairs;Hygiene/Grooming;Stand;Lift;Toileting  Examination-Participation Restrictions Meal Prep;Cleaning;Community Activity;Driving;Shop;Laundry;Yard Work  Pt will benefit from skilled therapeutic intervention in order to  improve on the following deficits Abnormal gait;Decreased activity tolerance;Decreased balance;Decreased coordination;Decreased endurance;Decreased mobility;Decreased range of motion;Decreased strength;Difficulty walking;Hypomobility;Increased fascial restricitons;Increased muscle spasms;Impaired flexibility;Postural dysfunction;Pain  Stability/Clinical Decision Making Evolving/Moderate complexity  Rehab Potential Good  PT Frequency 1x / week  PT Duration 4 weeks (4-5 weeks)  PT Treatment/Interventions ADLs/Self Care Home Management;Aquatic Therapy;Cryotherapy;English as a second language teacher;Therapeutic activities;Therapeutic exercise;Balance training;Neuromuscular re-education;Manual techniques;Orthotic Fit/Training;Patient/family education;Passive range of motion;Dry needling;Energy conservation;Splinting;Joint Manipulations;Taping  PT Next Visit Plan go over HEP/finalize condensed version of MedBridge and review hip flexor stretch. stair negotiation  training to enter/exit children's homes; Needs posture and hip flexor strengthening. Work on Insurance account manager on right for better knee control.  PT Home Exercise Plan Access Code: [email or web address], added stand hip abd, SL clams, bridge  Consulted and Agree with Plan of Care Patient   Patient will benefit from skilled therapeutic intervention in order to improve the following deficits and impairments:  Abnormal gait, Decreased activity tolerance, Decreased balance, Decreased coordination, Decreased endurance, Decreased mobility, Decreased range of motion, Decreased strength, Difficulty walking, Hypomobility, Increased fascial restricitons, Increased muscle spasms, Impaired flexibility, Postural dysfunction, Pain  Visit Diagnosis: Other lack of coordination  Other abnormalities of gait and mobility  Unsteadiness on feet  Muscle weakness (generalized)  Hemiplegia and hemiparesis following cerebral infarction affecting right dominant side [facility])     Problem List Patient Active Problem List   Diagnosis Date Noted  . Dysphagia, post-stroke   . Diabetes mellitus type 2 in obese ([place])   . Acute blood loss anemia   . Hypokalemia   . Diabetic peripheral neuropathy ([place])   . Right knee pain   . Acute ischemic right MCA stroke ([place]) 06/07/2019  . AMS (altered mental status)   . Incidental pulmonary nodule, > 55m and < 841m06/23/2020  . CVA (cerebral vascular accident) ([place]/22/2020  . 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 Onychomycosis 01/11/2015  . Pain in lower limb 01/11/2015  . Hammer toe of right foot 01/02/2015  . Pain in right foot 01/02/2015    ChArliss JourneyPT, DPT  01/02/2020, 5:12 PM  [facility]uCinnamon [place]hone: [phone] Fax:  [phone]: ThKarsin PestaRN: [id number] of Birth: [birth year]

## 2020-01-07 ENCOUNTER — Ambulatory Visit: Payer: Medicare HMO | Admitting: Physical Therapy

## 2020-01-07 ENCOUNTER — Encounter: Payer: Self-pay | Admitting: Physical Therapy

## 2020-01-07 ENCOUNTER — Other Ambulatory Visit: Payer: Self-pay

## 2020-01-07 ENCOUNTER — Ambulatory Visit: Payer: Medicare HMO | Admitting: Occupational Therapy

## 2020-01-07 DIAGNOSIS — I69351 Hemiplegia and hemiparesis following cerebral infarction affecting right dominant side: Secondary | ICD-10-CM

## 2020-01-07 DIAGNOSIS — M6281 Muscle weakness (generalized): Secondary | ICD-10-CM

## 2020-01-07 NOTE — Therapy (Signed)
[facility] [address] [place], [place], [address] Phone: [phone]   Fax:  [phone]  Physical Therapy Treatment  Patient Details  Name: Luke Rivera MRN: [id number] Date of Birth: [birth year] Referring Provider (PT): Frann Rider, NP   Encounter Date: 01/07/2020  PT End of Session - 01/07/20 1324    Visit Number  19    Number of Visits  24    Date for PT Re-Evaluation  03/02/20   written for 4-5 week POC   Authorization Type  [employer]na MCR    PT Start Time  1318    PT Stop Time  1400    PT Time Calculation (min)  42 min    Equipment Utilized During Treatment  Gait belt    Activity Tolerance  Patient tolerated treatment well    Behavior During Therapy  Flat affect       Past Medical History:  Diagnosis Date  . CAD (coronary artery disease)   . CHF (congestive heart failure) ([place])   . Chronic anticoagulation   . DM2 (diabetes mellitus, type 2) ([place])   . DVT (deep vein thrombosis) in pregnancy   . GERD (gastroesophageal reflux disease)   . HTN (hypertension)   . TIA (transient ischemic attack)     History reviewed. No pertinent surgical history.  There were no vitals filed for this visit.  Subjective Assessment - 01/07/20 1323    Subjective  No new complaints. No falls or pain to report.    Patient is accompained by:  Family member   spouse   How long can you stand comfortably?  10 min    How long can you walk comfortably?  10 min    Patient Stated Goals  get back to where he used to be    Currently in Pain?  Yes    Pain Score  5     Pain Orientation  Right    Pain Type  Chronic pain    Pain Onset  More than a month ago    Pain Frequency  Intermittent    Aggravating Factors   certain movements, poor positioning    Pain Relieving Factors  tylenol, topical rubs           [facility] Adult PT Treatment/Exercise - 01/07/20 1328      Self-Care   Self-Care  Other Self-Care Comments    Other  Self-Care Comments   provided copy of condenced [place] program with hip flexor stretch added in. Verbally reviewed ex's on handout with addition of hip flexor stretch off edge of bed at home.      Exercises   Exercises  Other Exercises    Other Exercises   supine on mat: hip/knee flexion off/onto mat (lowering foot to floor/back onto mat table) for 2 sets of 10 reps,then bridge with yoga block squeeze between knees for 15 reps with cues for pelvic lift and knee squeeze, then bridge with green band resisted clamshell for 10 reps with cues for form/technique, then right hip flexor stretch off edge of mat for 30 sec holds x 3 reps on right side. Standing with back against door frame- trunk extension for 10 reps with 3-5 sec holds, then altenrating UE raises (limited on right) for 5 reps each side; then seated at edge of mat table: scapular retractions for 5 sec holds for 10 reps, then rows with green band for 10 reps with cues on technique/form; chin tucks for 5 sec holds for 10 reps  with cues on form/technique.               PT Short Term Goals - 11/09/19 1550      PT SHORT TERM GOAL #1   Title  Pt will be I and compliant with HEP. (STG target date 4 weeks 11/11/19)    Baseline  not consistent yet    Status  On-going      PT SHORT TERM GOAL #2   Title  Pt will improve 5TSTS test to <40 seconds    Baseline  scored 26 seconds when retested on 12/1    Status  Achieved      PT SHORT TERM GOAL #3   Title  Pt will improve TUG to less than 40 seconds    Baseline  improved to 30 sec on 11/09/19    Status  Achieved      PT SHORT TERM GOAL #4   Title  Pt will improve gait speed to >1 ft/sec with LRAD    Baseline  improved to 1 ft/sec on 11/09/19    Status  Achieved        PT Long Term Goals - 01/02/20 1746      PT LONG TERM GOAL #1   Title  Pt will improve TUG to less than 20 seconds with RW to decrease fall risk. ALL LTGS DUE 01/30/20    Baseline  24.97 seconds on 1/22/1 with RW     Time  4    Period  Weeks    Status  Revised    Target Date  01/30/20      PT LONG TERM GOAL #2   Title  Pt will improve 5TSTS test from standard height chair with BUE support to 22 seconds or less to demo improved functional LE strength.    Baseline  26.14 seconds with BUE support from chair    Time  4    Period  Weeks    Status  Revised      PT LONG TERM GOAL #3   Title  Pt will improve gait speed greater than 1.3 ft/sec with RW to improve community mobility.    Baseline  33.84 seconds = .97 ft/sec    Time  4    Period  Weeks    Status  Revised      PT LONG TERM GOAL #4   Title  Pt will be independent with final HEP in order to build upon functional gains made in therapy.    Time  4    Period  Weeks    Status  New      PT LONG TERM GOAL #5   Title  Pt will be able to negotiate at least one flight of stairs with only one handrail vs. B handrails using step to pattern and supversion in order to visit with friends/family or have better access to community    Baseline  8 steps with B handrail with min A/min guard and step to pattern    Time  4    Period  Weeks    Status  Revised            Plan - 01/07/20 1324    Clinical Impression Statement  Today's skilled session focused on hip stretching and LE/postural strengthening with no issues other than fatigue reported. The pt is progressing toward goals and should benefit from continued PT to progress toward unmet goals.    Personal Factors and Comorbidities  Age;Past/Current Experience  Examination-Activity Limitations  Bathing;Transfers;Locomotion Level;Reach Overhead;Bed Mobility;Bend;Self Feeding;Carry;Squat;Dressing;Stairs;Hygiene/Grooming;Stand;Lift;Toileting    Examination-Participation Restrictions  Meal Prep;Cleaning;Community Activity;Driving;Shop;Laundry;Yard Work    Merchant [employer] officer  Evolving/Moderate complexity    Rehab Potential  Good    PT Frequency  1x / week    PT Duration  4 weeks   4-5  weeks   PT Treatment/Interventions  ADLs/Self Care Home Management;Aquatic Therapy;Cryotherapy;English as a second language teacher;Therapeutic activities;Therapeutic exercise;Balance training;Neuromuscular re-education;Manual techniques;Orthotic Fit/Training;Patient/family education;Passive range of motion;Dry needling;Energy conservation;Splinting;Joint Manipulations;Taping    PT Next Visit Plan  stair negotiation training to enter/exit children's homes; Needs posture and hip flexor strengthening. Work on Insurance account manager on right for better knee control.    PT Home Exercise Plan  Access Code: [email or web address]    Consulted and Agree with Plan of Care  Patient       Patient will benefit from skilled therapeutic intervention in order to improve the following deficits and impairments:  Abnormal gait, Decreased activity tolerance, Decreased balance, Decreased coordination, Decreased endurance, Decreased mobility, Decreased range of motion, Decreased strength, Difficulty walking, Hypomobility, Increased fascial restricitons, Increased muscle spasms, Impaired flexibility, Postural dysfunction, Pain  Visit Diagnosis: Hemiplegia and hemiparesis following cerebral infarction affecting right dominant side ([facility])  Muscle weakness (generalized)     Problem List Patient Active Problem List   Diagnosis Date Noted  . Dysphagia, post-stroke   . Diabetes mellitus type 2 in obese ([place])   . Acute blood loss anemia   . Hypokalemia   . Diabetic peripheral neuropathy ([place])   . Right knee pain   . Acute ischemic right MCA stroke ([place]) 06/07/2019  . AMS (altered mental status)   . Incidental pulmonary nodule, > 68mm and < 88mm 06/01/2019  . CVA (cerebral vascular accident) ([place]) 05/31/2019  . 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 Onychomycosis 01/11/2015  . Pain in lower limb 01/11/2015  . Hammer toe of right foot 01/02/2015  . Pain in right foot  01/02/2015    Willow Ora, PTA, [facility] [address] [place], [place] [address] [phone] 01/07/20, 11:02 PM   Name: Luke Rivera MRN: [id number] Date of Birth: 02/04/1941

## 2020-01-07 NOTE — Therapy (Deleted)
[facility] [address] [place], [place], [address] Phone: [phone]   Fax:  [phone]  Patient Details  Name: Luke Rivera MRN: [id number] Date of Birth: 06/15/1941 Referring Provider:  Kristopher Glee., MD  Encounter Date: 01/07/2020   Willow Ora, PTA, [facility] [address] [place], [place] [address] [phone] 01/07/20, 11:02 PM   [facility] [address] [place], [place], [address] Phone: [phone]   Fax:  [phone]

## 2020-01-11 ENCOUNTER — Ambulatory Visit: Payer: Medicare HMO | Attending: Adult Health | Admitting: Physical Therapy

## 2020-01-11 ENCOUNTER — Other Ambulatory Visit: Payer: Self-pay

## 2020-01-11 ENCOUNTER — Encounter: Payer: Medicare HMO | Admitting: Occupational Therapy

## 2020-01-11 ENCOUNTER — Encounter: Payer: Medicare HMO | Attending: Physical Medicine & Rehabilitation | Admitting: Physical Medicine & Rehabilitation

## 2020-01-11 ENCOUNTER — Encounter: Payer: Self-pay | Admitting: Physical Medicine & Rehabilitation

## 2020-01-11 VITALS — BP 128/70 | HR 63 | Temp 97.7°F | Ht 75.0 in | Wt 228.0 lb

## 2020-01-11 DIAGNOSIS — I69351 Hemiplegia and hemiparesis following cerebral infarction affecting right dominant side: Secondary | ICD-10-CM

## 2020-01-11 DIAGNOSIS — R278 Other lack of coordination: Secondary | ICD-10-CM | POA: Diagnosis present

## 2020-01-11 DIAGNOSIS — F4323 Adjustment disorder with mixed anxiety and depressed mood: Secondary | ICD-10-CM | POA: Diagnosis present

## 2020-01-11 DIAGNOSIS — M7501 Adhesive capsulitis of right shoulder: Secondary | ICD-10-CM | POA: Diagnosis not present

## 2020-01-11 DIAGNOSIS — R2681 Unsteadiness on feet: Secondary | ICD-10-CM | POA: Insufficient documentation

## 2020-01-11 DIAGNOSIS — R2689 Other abnormalities of gait and mobility: Secondary | ICD-10-CM | POA: Diagnosis present

## 2020-01-11 DIAGNOSIS — M6281 Muscle weakness (generalized): Secondary | ICD-10-CM | POA: Insufficient documentation

## 2020-01-11 MED ORDER — TIZANIDINE HCL 4 MG PO TABS: 4.0000 mg | ORAL_TABLET | Freq: Every day | ORAL | 1 refills | Status: DC

## 2020-01-11 NOTE — Patient Instructions (Signed)
The tizanidine is for muscle spasms.  You may take 2 of your current tablets until you finish the prescription, I have sent another prescription to your pharmacy for a stronger dose, 4 mg rather than 2 mg.

## 2020-01-11 NOTE — Progress Notes (Signed)
Shoulder injection Right glenohumeral With ultrasound guidance)  Indication: Right post stroke shoulder pain not relieved by medication management and other conservative care.  Informed consent was obtained after describing risks and benefits of the procedure with the patient, this includes bleeding, bruising, infection and medication side effects. The patient wishes to proceed and has given written consent. Patient was placed in a seated position. The right shoulder was marked and prepped with betadine in the subacromial area. A 25-gauge 1-1/2 inch needle was inserted into into the subacromial area, 2 cc of 1% lidocaine was injected.  Then a 80 mm 22-gauge echo block needle was inserted under direct ultrasound visualization long axis view.  Needle tip entered glenohumeral space, 1.5 mL of 6 mg/mL Celestone +4 mL of 1% lidocaine were injected.  A band aid was applied. The patient tolerated the procedure well. Post procedure instructions were given.

## 2020-01-11 NOTE — Therapy (Signed)
[facility] [address] [place], [place], [address] Phone: [phone]   Fax:  [phone]  Physical Therapy Treatment/10th Visit Progress Note   Patient Details  Name: Luke Rivera MRN: [id number] Date of Birth: 09-22-1941 Referring Provider (PT): Frann Rider, NP  10th Visit Physical Therapy Progress Note  Dates of Reporting Period: 11/19/19 to 01/11/20    Encounter Date: 01/11/2020  PT End of Session - 01/11/20 2031    Visit Number  20    Number of Visits  24    Date for PT Re-Evaluation  03/02/20   written for 4-5 week POC   Authorization Type  [employer] [facility]    PT Start Time  [id number]    PT Stop Time  0500    PT Time Calculation (min)  45 min    Equipment Utilized During Treatment  Gait belt    Activity Tolerance  Patient tolerated treatment well    Behavior During Therapy  Flat affect;WFL for tasks assessed/performed       Past Medical History:  Diagnosis Date  . CAD (coronary artery disease)   . CHF (congestive heart failure) ([place])   . Chronic anticoagulation   . DM2 (diabetes mellitus, type 2) ([place])   . DVT (deep vein thrombosis) in pregnancy   . GERD (gastroesophageal reflux disease)   . HTN (hypertension)   . TIA (transient ischemic attack)     No past surgical history on file.  There were no vitals filed for this visit.  Subjective Assessment - 01/11/20 1618    Subjective  Saw Dr. Letta Pate earlier today for an injection in his R shoulder. Upper his muscle relaxer dose as well. No falls. States the exercises are going ok.    Patient is accompained by:  Family member   spouse   How long can you stand comfortably?  10 min    How long can you walk comfortably?  10 min    Patient Stated Goals  get back to where he used to be    Currently in Pain?  No/denies    Pain Onset  More than a month ago                       [facility] Adult PT Treatment/Exercise - 01/11/20 0001      Transfers    Sit to Stand  5: Supervision;Without upper extremity assist;From elevated surface    Sit to Stand Details  Verbal cues for technique;Verbal cues for sequencing    Stand to Sit  5: Supervision;Without upper extremity assist;To elevated surface    Comments  Sit <> stands from mat table elevated to 24" with no UE support. Cues for anterior forward lean prior to standing for weight shift and to prevent BLE against table while standing. 2 x 10 reps - pt able to perform 2nd set of 10 reps with proper weight shifting each time.       High Level Balance   High Level Balance Activities  Other (comment)    High Level Balance Comments  Standing at edge of mat table with RW nearby for safety: 15 reps standing on blue foam and reaching laterally with LUE to grab cornhole bag and toss with LUE into basket, 15 reps standing on level floor multi-directional reaching across body with LUE to grab bean bag from therapist on R for weight shifting and tossing into basket. Min guard for balance, cues for posture between each couple of reps,  as pt with increased forward flexed posture.On blue foam in bars: with wide BOS, 2 x 5 reps head turns, 2 x 5 reps head nods - min guard for balance, intermittent cues for posture as pt tends to have incr forward flexed posture and weight shifted anteriorly.       Knee/Hip Exercises: Standing   Forward Step Up  2 sets;10 reps;Hand Hold: 2;Step Height: 6";Left    Forward Step Up Limitations  1 x 10 reps leading with RLE forward step ups with BUE support, cues for posture             PT Education - 01/11/20 2030    Education Details  continue current HEP    Person(s) Educated  Patient;Spouse    Methods  Explanation    Comprehension  Verbalized understanding       PT Short Term Goals - 11/09/19 1550      PT SHORT TERM GOAL #1   Title  Pt will be I and compliant with HEP. (STG target date 4 weeks 11/11/19)    Baseline  not consistent yet    Status  On-going      PT  SHORT TERM GOAL #2   Title  Pt will improve 5TSTS test to <40 seconds    Baseline  scored 26 seconds when retested on 12/1    Status  Achieved      PT SHORT TERM GOAL #3   Title  Pt will improve TUG to less than 40 seconds    Baseline  improved to 30 sec on 11/09/19    Status  Achieved      PT SHORT TERM GOAL #4   Title  Pt will improve gait speed to >1 ft/sec with LRAD    Baseline  improved to 1 ft/sec on 11/09/19    Status  Achieved        PT Long Term Goals - 01/02/20 1746      PT LONG TERM GOAL #1   Title  Pt will improve TUG to less than 20 seconds with RW to decrease fall risk. ALL LTGS DUE 01/30/20    Baseline  24.97 seconds on 1/22/1 with RW    Time  4    Period  Weeks    Status  Revised    Target Date  01/30/20      PT LONG TERM GOAL #2   Title  Pt will improve 5TSTS test from standard height chair with BUE support to 22 seconds or less to demo improved functional LE strength.    Baseline  26.14 seconds with BUE support from chair    Time  4    Period  Weeks    Status  Revised      PT LONG TERM GOAL #3   Title  Pt will improve gait speed greater than 1.3 ft/sec with RW to improve community mobility.    Baseline  33.84 seconds = .97 ft/sec    Time  4    Period  Weeks    Status  Revised      PT LONG TERM GOAL #4   Title  Pt will be independent with final HEP in order to build upon functional gains made in therapy.    Time  4    Period  Weeks    Status  New      PT LONG TERM GOAL #5   Title  Pt will be able to negotiate at least one flight of stairs with  only one handrail vs. B handrails using step to pattern and supversion in order to visit with friends/family or have better access to community    Baseline  8 steps with B handrail with min A/min guard and step to pattern    Time  4    Period  Weeks    Status  Revised            Plan - 01/11/20 2041    Clinical Impression Statement  10th visit progress note: LTGs assessed on 99991111 for re-cert and  extending POC. Pt performed 5x sit <> stand in 26.14 seconds with BUE and decr forward weight shifting, indicating decreased functional LE strength. Pt's gait speed with RW was .97 ft/sec, indicating that pt is at a higher risk of falls. Pt performed 8 steps with min guard/min A with B handrails and step to pattern. Pt reports he has been performing his HEP more consistently at home, especially sit to stands. And reports relief after performing the supine hip flexor stretch. Focus of today's skilled session focused on functional LE strengthening - with sit <> stands from elevated mat table with no UE support and proper anterior weight shifting and step ups, and standing dynamic balance activities. Pt tolerated session well, will continue to progress towards LTGs.    Personal Factors and Comorbidities  Age;Past/Current Experience    Examination-Activity Limitations  Bathing;Transfers;Locomotion Level;Reach Overhead;Bed Mobility;Bend;Self Feeding;Carry;Squat;Dressing;Stairs;Hygiene/Grooming;Stand;Lift;Toileting    Examination-Participation Restrictions  Meal Prep;Cleaning;Community Activity;Driving;Shop;Laundry;Yard Work    [employer]chant [employer] officer  Evolving/Moderate complexity    Rehab Potential  Good    PT Frequency  1x / week    PT Duration  4 weeks   4-5 weeks   PT Treatment/Interventions  ADLs/Self Care Home Management;Aquatic Therapy;Cryotherapy;English as a second language teacher;Therapeutic activities;Therapeutic exercise;Balance training;Neuromuscular re-education;Manual techniques;Orthotic Fit/Training;Patient/family education;Passive range of motion;Dry needling;Energy conservation;Splinting;Joint Manipulations;Taping    PT Next Visit Plan  sit <> stands with no UE support (performed from 24" on elevated mat table at last session), stair negotiation training to enter/exit children's homes; Needs posture and hip flexor strengthening. Work on  Insurance account manager on right for better knee control. static balance - eyes closed/head motions.    PT Home Exercise Plan  Access Code: [email or web address]    Consulted and Agree with Plan of Care  Patient       Patient will benefit from skilled therapeutic intervention in order to improve the following deficits and impairments:  Abnormal gait, Decreased activity tolerance, Decreased balance, Decreased coordination, Decreased endurance, Decreased mobility, Decreased range of motion, Decreased strength, Difficulty walking, Hypomobility, Increased fascial restricitons, Increased muscle spasms, Impaired flexibility, Postural dysfunction, Pain  Visit Diagnosis: Hemiplegia and hemiparesis following cerebral infarction affecting right dominant side (HCC)  Muscle weakness (generalized)  Other lack of coordination  Unsteadiness on feet     Problem List Patient Active Problem List   Diagnosis Date Noted  . Dysphagia, post-stroke   . Diabetes mellitus type 2 in obese ([place])   . Acute blood loss anemia   . Hypokalemia   . Diabetic peripheral neuropathy ([place])   . Right knee pain   . 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 AMS (altered mental status)   . Incidental pulmonary nodule, > 41mm and < 43mm 06/01/2019  . CVA (cerebral vascular accident) ([place]) 05/31/2019  . 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 Onychomycosis 01/11/2015  . Pain in lower limb 01/11/2015  . Hammer toe of right foot 01/02/2015  .  Pain in right foot 01/02/2015    Arliss Journey, PT, DPT  01/11/2020, 8:42 PM  [facility] [address] [place], [place], [address] Phone: [phone]   Fax:  [phone]  Name: Luke Rivera MRN: [id number] Date of Birth: 1941-11-05

## 2020-01-18 ENCOUNTER — Telehealth: Payer: Self-pay

## 2020-01-18 NOTE — Telephone Encounter (Signed)
If the fall occurred after the Zanaflex dose in the evening or at night it may be related to that higher dose of Zanaflex.  I would then reduce it to 2 mg at night.  I think it is unlikely to be a side effect of the Covid vaccine

## 2020-01-18 NOTE — Telephone Encounter (Signed)
[name] the wife of patient called stating that patient fell the other day and recently his muscle relaxer was increased. She wanted to know if this may have anything to do with it. Also patient having second COVID vaccine and wants to know what to do if patient has side effects.

## 2020-01-19 ENCOUNTER — Telehealth: Payer: Self-pay | Admitting: Adult Health

## 2020-01-19 NOTE — Telephone Encounter (Signed)
I contacted patients caregiver.  She states the fall occurred in the morning in the bathroom.  Patient was getting ready to take a shower.  She states she found him in the bathtub.  She was wondering if he is suffering from confusion or side effects from tizanidine increase carrying over to the morning.  The patient cannot remember how he ended up in the tub. The question about the Covid-19 vaccine is unrelated.  She was wanting to know if the second dose of Covid-19 is cause for concern given patients age and current condition.  This is based upon the publicity the second dose has received as far as the potential for increased side effects

## 2020-01-19 NOTE — Telephone Encounter (Signed)
I called patient and LVM to reschedule 2/24 appointment due to NP out of office. Requested patient call back to reschedule.

## 2020-01-20 ENCOUNTER — Other Ambulatory Visit: Payer: Self-pay

## 2020-01-20 ENCOUNTER — Encounter: Payer: Medicare HMO | Admitting: Occupational Therapy

## 2020-01-20 ENCOUNTER — Ambulatory Visit: Payer: Medicare HMO | Admitting: Physical Therapy

## 2020-01-20 DIAGNOSIS — M6281 Muscle weakness (generalized): Secondary | ICD-10-CM

## 2020-01-20 DIAGNOSIS — I69351 Hemiplegia and hemiparesis following cerebral infarction affecting right dominant side: Secondary | ICD-10-CM

## 2020-01-20 DIAGNOSIS — R2681 Unsteadiness on feet: Secondary | ICD-10-CM

## 2020-01-20 NOTE — Telephone Encounter (Signed)
Ok to have second Covid dose , some muscle aches and malaise are possible but this are of short duration  WOuld cont Zanaflex 2mg  qhs

## 2020-01-20 NOTE — Telephone Encounter (Signed)
Pt has been r/s  

## 2020-01-20 NOTE — Therapy (Signed)
[facility] [address] [place], [place], [address] Phone: [phone]   Fax:  [phone]  Physical Therapy Treatment  Patient Details  Name:  Luke Rivera MRN: [id number] Date of Birth: 01/18/1941 Referring Provider (PT): Frann Rider, NP   Encounter Date: 01/20/2020  PT End of Session - 01/20/20 1801    Visit Number  21    Number of Visits  24    Date for PT Re-Evaluation  03/02/20   written for 4-5 week POC   Authorization Type  [employer]    PT Start Time  1700    PT Stop Time  1744    PT Time Calculation (min)  44 min    Equipment Utilized During Treatment  Gait belt    Activity Tolerance  Patient tolerated treatment well    Behavior During Therapy  Flat affect;WFL for tasks assessed/performed       Past Medical History:  Diagnosis Date  . CAD (coronary artery disease)   . CHF (congestive heart failure) ([place])   . Chronic anticoagulation   . DM2 (diabetes mellitus, type 2) ([place])   . DVT (deep vein thrombosis) in pregnancy   . GERD (gastroesophageal reflux disease)   . HTN (hypertension)   . TIA (transient ischemic attack)     No past surgical history on file.  There were no vitals filed for this visit.  Subjective Assessment - 01/20/20 1704    Subjective  Pt fell since last visit, lost balance in bathroom. Followed up with neurologist after fall. Reports no residual injury.    Patient is accompained by:  Family member   spouse   How long can you stand comfortably?  10 min    How long can you walk comfortably?  10 min    Patient Stated Goals  get back to where he used to be    Currently in Pain?  No/denies    Pain Onset  More than a month ago                       [facility] Adult PT Treatment/Exercise - 01/20/20 0001      Transfers   Transfers  Sit to Stand;Stand to Sit    Sit to Stand  4: Min guard    Transfer Cueing  strength and balance training. sit<>stand from standard chair  progressing from 2-1 UE support, cues for weight shifting forward and to increase R side weight bearing x10.                                               Ambulation/Gait   Ambulation/Gait  Yes    Ambulation/Gait Assistance  5: Supervision    Ambulation/Gait Assistance Details  cues for R foot placement with internal hip rotators activation; cues for upright posture.                                  Ambulation Distance (Feet)  100 Feet    Assistive device  Rolling walker    Gait Pattern  Step-through pattern;Decreased arm swing - right;Decreased step length - left;Decreased stance time - right;Decreased stride length;Decreased hip/knee flexion - right;Right genu recurvatum;Trunk flexed    Ambulation Surface  Level;Indoor      Posture/Postural Control  Posture/Postural Control  Postural limitations    Postural Limitations  Rounded Shoulders;Forward head    Posture Comments  Strength training see below in balance standing exercsies.     Knee/Hip Exercises: Standing   Heel Raises  Both;2 sets;10 reps;3 seconds   LLE 2# weight, no weight RLE   Other Standing Knee Exercises  standing hamstring curls, 10x2, L 2#weight, R no weight manual tapping on hamstring to increase muscle activation.                                             Knee/Hip Exercises: Seated   Long Arc Quad  AAROM;Strengthening;Left;2 setsx10 ;Weights   2# on L LE , no weight on RLE manual tapping for incresed muscle activation   Other Seated Knee/Hip Exercises   Rigth hip internal rotator strengthening with right LE on stool; cues for muscle activation and holding position x10.                                                 Balance Exercises - 01/20/20 1806      Balance Exercises: Standing   Other Standing Exercises  posture strengthening and balance: arms extended holding bar: raising overhead, chest press, and trunk rotations with manual, verbal and visual cues from the mirror for posture, 5x2 each.                                                    PT Education - 01/20/20 1759    Education Details  Education for increased understanding of LE musculature that pt can activate on R side    Person(s) Educated  Patient;Spouse    Methods  Explanation;Demonstration;Tactile cues;Verbal cues    Comprehension  Verbalized understanding       PT Short Term Goals - 11/09/19 1550      PT SHORT TERM GOAL #1   Title  Pt will be I and compliant with HEP. (STG target date 4 weeks 11/11/19)    Baseline  not consistent yet    Status  On-going      PT SHORT TERM GOAL #2   Title  Pt will improve 5TSTS test to <40 seconds    Baseline  scored 26 seconds when retested on 12/1    Status  Achieved      PT SHORT TERM GOAL #3   Title  Pt will improve TUG to less than 40 seconds    Baseline  improved to 30 sec on 11/09/19    Status  Achieved      PT SHORT TERM GOAL #4   Title  Pt will improve gait speed to >1 ft/sec with LRAD    Baseline  improved to 1 ft/sec on 11/09/19    Status  Achieved        PT Long Term Goals - 01/02/20 1746      PT LONG TERM GOAL #1   Title  Pt will improve TUG to less than 20 seconds with RW to decrease fall risk. ALL LTGS DUE 01/30/20    Baseline  24.97 seconds on 1/22/1  with RW    Time  4    Period  Weeks    Status  Revised    Target Date  01/30/20      PT LONG TERM GOAL #2   Title  Pt will improve 5TSTS test from standard height chair with BUE support to 22 seconds or less to demo improved functional LE strength.    Baseline  26.14 seconds with BUE support from chair    Time  4    Period  Weeks    Status  Revised      PT LONG TERM GOAL #3   Title  Pt will improve gait speed greater than 1.3 ft/sec with RW to improve community mobility.    Baseline  33.84 seconds = .97 ft/sec    Time  4    Period  Weeks    Status  Revised      PT LONG TERM GOAL #4   Title  Pt will be independent with final HEP in order to build upon functional gains made in therapy.    Time  4    Period   Weeks    Status  New      PT LONG TERM GOAL #5   Title  Pt will be able to negotiate at least one flight of stairs with only one handrail vs. B handrails using step to pattern and supversion in order to visit with friends/family or have better access to community    Baseline  8 steps with B handrail with min A/min guard and step to pattern    Time  4    Period  Weeks    Status  Revised            Plan - 01/20/20 1802    Clinical Impression Statement  Skilled session focused on LE strengthening with manual and verbal cues for muscle activation on RLE and weighted exercise on LLE, standing balance progressing with decreased UE support ( pt still requiring min guard with no UE support) with cues for posture and  standing posture strengthening exercises using mirror for feedback, and gait training with a focus on Right LE control. Pt is progressing towards goals.    Personal Factors and Comorbidities  Age;Past/Current Experience    Examination-Activity Limitations  Bathing;Transfers;Locomotion Level;Reach Overhead;Bed Mobility;Bend;Self Feeding;Carry;Squat;Dressing;Stairs;Hygiene/Grooming;Stand;Lift;Toileting    Examination-Participation Restrictions  Meal Prep;Cleaning;Community Activity;Driving;Shop;Laundry;Yard Work    Merchant [employer] officer  Evolving/Moderate complexity    Rehab Potential  Good    PT Frequency  1x / week    PT Duration  4 weeks   4-5 weeks   PT Treatment/Interventions  ADLs/Self Care Home Management;Aquatic Therapy;Cryotherapy;English as a second language teacher;Therapeutic activities;Therapeutic exercise;Balance training;Neuromuscular re-education;Manual techniques;Orthotic Fit/Training;Patient/family education;Passive range of motion;Dry needling;Energy conservation;Splinting;Joint Manipulations;Taping    PT Next Visit Plan  sit <> stands with no UE support (performed from 24" on elevated mat table at last session),  stair negotiation training to enter/exit children's homes; Needs posture and hip flexor strengthening. Work on Insurance account manager on right for better knee control. static balance - eyes closed/head motions.    PT Home Exercise Plan  Access Code: [email or web address]    Consulted and Agree with Plan of Care  Patient       Patient will benefit from skilled therapeutic intervention in order to improve the following deficits and impairments:  Abnormal gait, Decreased activity tolerance, Decreased balance, Decreased coordination, Decreased endurance, Decreased mobility, Decreased range of motion, Decreased strength, Difficulty walking, Hypomobility, Increased fascial restricitons, Increased muscle  spasms, Impaired flexibility, Postural dysfunction, Pain  Visit Diagnosis: Hemiplegia and hemiparesis following cerebral infarction affecting right dominant side (HCC)  Muscle weakness (generalized)  Unsteadiness on feet     Problem List Patient Active Problem List   Diagnosis Date Noted  . Dysphagia, post-stroke   . Diabetes mellitus type 2 in obese ([place])   . Acute blood loss anemia   . Hypokalemia   . Diabetic peripheral neuropathy ([place])   . Right knee pain   . Acute ischemic right MCA stroke ([place]) 06/07/2019  . AMS (altered mental status)   . Incidental pulmonary nodule, > 18mm and < 53mm 06/01/2019  . CVA (cerebral vascular accident) ([place]) 05/31/2019  . 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 Onychomycosis 01/11/2015  . Pain in lower limb 01/11/2015  . Hammer toe of right foot 01/02/2015  . Pain in right foot 01/02/2015    Bjorn Loser, PTA  01/20/20, 6:16 PM [facility] [address] [place], [place], [address] Phone: [phone]   Fax:  [phone]  Name: Torran Whidby MRN: [id number] Date of Birth: Aug 04, 1941

## 2020-01-21 NOTE — Telephone Encounter (Signed)
Spoke to patients wife. Patient is tolerating 4 mg of zanaflex at night. His leg is not 'jerking' at night.  Dr. Letta Pate was at the nurses station while I was conversing.  He agreed to let patient stay at 4mg  qhs as long as incident was isolated

## 2020-01-24 ENCOUNTER — Encounter: Payer: Self-pay | Admitting: Adult Health

## 2020-01-24 ENCOUNTER — Other Ambulatory Visit: Payer: Self-pay

## 2020-01-24 ENCOUNTER — Ambulatory Visit: Payer: Medicare HMO | Admitting: Adult Health

## 2020-01-24 VITALS — BP 121/68 | HR 64 | Temp 94.4°F | Ht 75.0 in | Wt 228.0 lb

## 2020-01-24 DIAGNOSIS — E669 Obesity, unspecified: Secondary | ICD-10-CM

## 2020-01-24 DIAGNOSIS — E1169 Type 2 diabetes mellitus with other specified complication: Secondary | ICD-10-CM

## 2020-01-24 DIAGNOSIS — R4701 Aphasia: Secondary | ICD-10-CM

## 2020-01-24 DIAGNOSIS — I69351 Hemiplegia and hemiparesis following cerebral infarction affecting right dominant side: Secondary | ICD-10-CM

## 2020-01-24 DIAGNOSIS — E785 Hyperlipidemia, unspecified: Secondary | ICD-10-CM | POA: Diagnosis not present

## 2020-01-24 DIAGNOSIS — I10 Essential (primary) hypertension: Secondary | ICD-10-CM | POA: Diagnosis not present

## 2020-01-24 DIAGNOSIS — Z8673 Personal history of transient ischemic attack (TIA), and cerebral infarction without residual deficits: Secondary | ICD-10-CM | POA: Diagnosis not present

## 2020-01-24 DIAGNOSIS — I639 Cerebral infarction, unspecified: Secondary | ICD-10-CM

## 2020-01-24 NOTE — Patient Instructions (Addendum)
Continue clopidogrel 75 mg daily and Eliquis (apixaban) daily  and atorvastatin for secondary stroke prevention  Continue to follow up with PCP regarding cholesterol, diabetes and blood pressure management   Continue to work with outpatient therapies for ongoing improvement  Continue to monitor blood pressure at home  Maintain strict control of hypertension with blood pressure goal below 130/90, diabetes with hemoglobin A1c goal below 6.5% and cholesterol with LDL cholesterol (bad cholesterol) goal below 70 mg/dL. I also advised the patient to eat a healthy diet with plenty of whole grains, cereals, fruits and vegetables, exercise regularly and maintain ideal body weight.         Thank you for coming to see [place] at [facility]. I hope we have been able to provide you high quality care today.  You may receive a patient satisfaction survey over the next few weeks. We would appreciate your feedback and comments so that we may continue to improve ourselves and the health of our patients.

## 2020-01-24 NOTE — Progress Notes (Signed)
[facility] [address] [place]. [place] [address] [phone]       STROKE FOLLOW UP NOTE  [name] Date of Birth:  Apr 23, 1941 Medical Record Number:  [id number]   Reason for Referral:  stroke follow up    CHIEF COMPLAINT:  Chief Complaint  Patient presents with  . Follow-up    Rm9. with wife. No questions nor concerns.     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  Residual stroke deficits right hemiparesis and aphasia have been slightly improving with ongoing participation in outpatient therapies.  Continues to ambulate with rolling walker and use of right AFO brace at all times.  Continues on Eliquis, Plavix and atorvastatin for secondary stroke prevention side effects.  Recent lipid panel showed LDL 56.  Blood pressure today 121/68.  Glucose levels stable with recent A1c 6.2.  No further concerns at this time.    Stroke admission 05/31/2019: Luke Rivera is a 79 y.o. 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  Neurology consulted with stroke work-up revealing left MCA infarct embolic pattern as evidenced on MRI secondary to unknown source.  Question possible PFO with finding of DVT versus other cardioembolic source (AF most likely).  CTA head/neck negative for LVO but did show left ICA 65 to 70% stenosis, right ICA 50% stenosis, hypoplastic right VA with reconstitution of PT, dominant left VA, mild distal left V4 50%, and diffuse small vessel disease anterior circulation.  MRI did show evidence of acute left posterior frontal MCA infarct along with chronic left frontal MCA infarct.  2D echo normal EF without cardiac source of embolism.  TCD bubble negative for PFO.  Prior  history of DVT for which she was previously on clopidogrel 75 mg daily and Eliquis and recommended increased dose of Eliquis 5 mg twice daily and continuation of Plavix.  HTN stable.  LDL 49 and recommended continuation of statin.  Uncontrolled DM with A1c 7.3.  Other stroke risk factors include advanced age, former tobacco use, obesity and CAD status post stents on Plavix prior to admission.  Residual right hemiparesis and aphasia and was discharged to CIR for ongoing therapy.  He was discharged home on 07/02/2019 with recommendation of home therapies.  Initial visit 05/12/2019: Luke Rivera is being seen today for stroke follow-up accompanied by his wife.  Continues to have residual deficits of right hemiparesis, expressive aphasia and post stroke depression.  His wife endorses great improvement with ongoing participation in home PT/OT/ST.  Ambulating with rolling walker but does use wheelchair for long distance transferring.  Initial difficulty with depression but was started on Celexa 10 mg daily with improvement.  Denies ongoing difficulties with depression or anxiety.  He has continued on Eliquis 5 mg twice daily and clopidogrel without bleeding or bruising.  Continues on atorvastatin 80 mg daily without myalgias.  Blood pressure today 120/67.  He continues to follow with PCP regularly for HTN, HLD and DM management.  He continues to follow with cardiology regularly.  Denies new or worsening stroke/TIA symptoms.  Update 09/29/2019: Luke Rivera is being seen today for stroke follow-up accompanied by his wife.  Residual deficits improving including right hemiparesis and expressive aphasia.  He continues to participate in home  health therapies with recent completion of the PT and will have a couple more sessions of OT and ST.  Patient is interested in participating in outpatient therapy.  He continues to ambulate with rolling walker and will use wheelchair for appointments only.  Was having increased spasticity on  left side and was started on tizanidine by Dr. Dianna Limbo.  Depression has been stable with ongoing use of Celexa.  Continues on Eliquis and Plavix for secondary stroke prevention with side effects.  Continues on atorvastatin without myalgias.  Blood pressure today satisfactory 112/68.  Wife does endorse episode of increased fatigue and decreased consciousness after episode of bearing down for bowel movement due to constipation associated with diaphoresis.  Denies worsening stroke symptoms.  She now monitors bowel movements regularly.  No further concerns at this time.        ROS:   14 system review of systems performed and negative with exception of speech difficulty, weakness, and gait difficulty  PMH:  Past Medical History:  Diagnosis Date  . CAD (coronary artery disease)   . CHF (congestive heart failure) ([place])   . Chronic anticoagulation   . DM2 (diabetes mellitus, type 2) ([place])   . DVT (deep vein thrombosis) in pregnancy   . GERD (gastroesophageal reflux disease)   . HTN (hypertension)   . TIA (transient ischemic attack)     PSH: No past surgical history on file.  Social History:  Social History   Socioeconomic History  . Marital status: Married    Spouse name: Not on file  . Number of children: Not on file  . Years of education: Not on file  . Highest education level: Not on file  Occupational History  . Not on file  Tobacco Use  . Smoking status: Former Research scientist (life sciences)  . Smokeless tobacco: Never Used  Substance and Sexual Activity  . Alcohol use: Never    Alcohol/week: 0.0 standard drinks  . Drug use: Never  . Sexual activity: Not on file  Other Topics Concern  . Not on file  Social History Narrative  . Not on file   Social Determinants of Health   Financial Resource Strain:   . Difficulty of Paying Living Expenses: Not on file  Food Insecurity:   . Worried About Charity fundraiser in the Last Year: Not on file  . Ran Out of Food in the Last Year: Not on file    Transportation Needs:   . Lack of Transportation (Medical): Not on file  . Lack of Transportation (Non-Medical): Not on file  Physical Activity:   . Days of Exercise per Week: Not on file  . Minutes of Exercise per Session: Not on file  Stress:   . Feeling of Stress : Not on file  Social Connections:   . Frequency of Communication with Friends and Family: Not on file  . Frequency of Social Gatherings with Friends and Family: Not on file  . Attends Religious Services: Not on file  . Active Member of Clubs or Organizations: Not on file  . Attends Archivist Meetings: Not on file  . Marital Status: Not on file  Intimate Partner Violence:   . Fear of Current or Ex-Partner: Not on file  . Emotionally Abused: Not on file  . Physically Abused: Not on file  . Sexually Abused: Not on file    Family History:  Family History  Problem Relation Age of Onset  . Hypertension Father   . Heart disease Mother   .  Heart attack Brother     Medications:   Current Outpatient Medications on File Prior to Visit  Medication Sig Dispense Refill  . acetaminophen (TYLENOL) 325 MG tablet Take 1-2 tablets (325-650 mg total) by mouth every 4 (four) hours as needed for mild pain.    Marland Kitchen apixaban (ELIQUIS) 5 MG TABS tablet Take 1 tablet (5 mg total) by mouth 2 (two) times daily. 60 tablet   . atorvastatin (LIPITOR) 80 MG tablet Take 40 mg by mouth at bedtime.     . citalopram (CELEXA) 10 MG tablet Take 1 tablet (10 mg total) by mouth daily. 30 tablet 5  . clopidogrel (PLAVIX) 75 MG tablet Take 1 tablet (75 mg total) by mouth daily. 30 tablet 1  . diclofenac sodium (VOLTAREN) 1 % GEL Apply 2 g topically 4 (four) times daily. 350 g 1  . insulin glargine (LANTUS) 100 UNIT/ML injection Inject 0.22 mLs (22 Units total) into the skin at bedtime. 10 mL 11  . loperamide (IMODIUM A-D) 2 MG tablet Take 1 tablet (2 mg total) by mouth 4 (four) times daily as needed for diarrhea or loose stools. 30 tablet 0  .  Multiple Vitamin (MULTIVITAMIN WITH MINERALS) TABS tablet Take 1 tablet by mouth daily.    . nitroGLYCERIN (NITROSTAT) 0.6 MG SL tablet Place 0.6 mg under the tongue as needed for chest pain.    . pantoprazole (PROTONIX) 40 MG tablet Take 40 mg by mouth daily.    . potassium chloride SA (K-DUR) 20 MEQ tablet Take 1 tablet (20 mEq total) by mouth daily. 30 tablet 0  . tiZANidine (ZANAFLEX) 4 MG tablet Take 1 tablet (4 mg total) by mouth at bedtime. 30 tablet 1   No current facility-administered medications on file prior to visit.    Allergies:  No Known Allergies   Physical Exam  Vitals:   01/24/20 1248  BP: 121/68  Pulse: 64  Temp: (!) 94.4 F (34.7 C)  Weight: 228 lb (103.4 kg)  Height: 6\' 3"  (1.905 m)   Body mass index is 28.5 kg/m. No exam data present  General: well developed, well nourished,  pleasant elderly Caucasian male, seated, in no evident distress Head: head normocephalic and atraumatic.   Neck: supple with no carotid or supraclavicular bruits Cardiovascular: regular rate and rhythm, no murmurs Musculoskeletal: no deformity; decreased right hand flexion Skin:  no rash/petichiae Vascular:  Normal pulses all extremities   Neurologic Exam Mental Status: Awake and fully alert.   Mild expressive aphasia with speech hesitancy.  Oriented to place and time. Recent and remote memory intact. Attention span, concentration and fund of knowledge appropriate. Mood and affect appropriate.  Cranial Nerves: Pupils equal, briskly reactive to light. Extraocular movements full without nystagmus. Visual fields full to confrontation. Hearing intact. Facial sensation intact.  Mild right lower facial weakness. Motor:  RUE: 4/5 with weak grip strength RLE: 4+/5 hip flexor with weak ankle dorsiflexion; AFO in place Full strength left upper and lower extremity Sensory.: intact to touch , pinprick , position and vibratory sensation.  Coordination: Rapid alternating movements diminished on  right hand. Finger-to-nose and heel-to-shin performed accurately on left side with difficulty performing on right side due to weakness. Gait and Station: Able to stand from seated position with mild difficulty independently.  Stance is slightly hunched.  Hemiplegic gait with outward turning of right foot and use of rolling walker Reflexes: 2+ right upper and lower extremity and 1+ left upper and lower extremity. Toes downgoing.  Diagnostic Data (Labs, Imaging, Testing)   Code Stroke CT head possible hypodensity left mid frontal super ganglionic territory.  Old left frontal infarct.  Age-related small vessel disease and atrophy.  ASPECTS 9    CTA head & neck no LVO.  L ICA 65 to 70%.  R ICA 50%.  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  Dominant L VA.  Mid distal L V4 50%.  Diffuse small vessel disease anterior circulation.  Emphysema.  6 mm LUL nodule, indeterminate.  MRI acute left posterior frontal MCA infarct.  Chronic left frontal MCA infarct as well.    2D Echo normal ejection fraction no cardiac source of embolism.  TCD bubble  Negative for PFO.   LDL 49  HgbA1c 7.3    ASSESSMENT: Luke Rivera is a 79 y.o. year old male presented with garbled speech and right sided weakness on 05/31/19 with stroke work up showing left MCA infarct embolic pattern secondary to unknown source. Vascular risk factors include questionable atrial fibrillation history, hx of unprovoked DVT on eliquis, CAD s/p stents, obesity, former tobacco use, HTN, HLD and DM.  Residual deficits of right hemiparesis, right facial droop and expressive aphasia with ongoing improvement    PLAN:  1. Left MCA stroke : Continue clopidogrel 75 mg daily and Eliquis (apixaban) daily  and lipitor  for secondary stroke prevention. Maintain strict control of hypertension with blood pressure goal below 130/90, diabetes with hemoglobin A1c goal below 6.5% and cholesterol with LDL cholesterol (bad cholesterol) goal below 70  mg/dL.  I also advised the patient to eat a healthy diet with plenty of whole grains, cereals, fruits and vegetables, exercise regularly with at least 30 minutes of continuous activity daily and maintain ideal body weight. 2. Right hemiparesis and aphasia: Continue to work with outpatient therapies for ongoing improvement as well as ensuring routine exercises at home.  Advised to discuss use of AFO brace at all times with therapy to ensure this is recommended versus use only when outside of the home or with exercise. 3. Cardiology: Continue Eliquis 5 mg twice daily due to potential prior episode of atrial fibrillation and unprovoked DVT.  He will continue on Plavix due to history of CAD with stents.  Continue to follow with cardiology regularly 4. HTN: Advised to continue current treatment regimen. Advised to continue to monitor at home along with continued follow-up with PCP for management 5. HLD: Advised to continue current treatment regimen along with continued follow-up with PCP for future prescribing and monitoring of lipid panel 6. DMII: Advised to continue to monitor glucose levels at home along with continued follow-up with PCP for management and monitoring   Overall stable from stroke standpoint.  Recommend follow-up on an as-needed basis but to call office with any questions or concerns regarding prior stroke.  Patient and wife are in agreement with plan   Greater than 50% of time during this 20 minute visit was spent on counseling, explanation of diagnosis of left MCA stroke, reviewing risk factor management of questionable atrial fibrillation, HTN, HLD, DM, DVT and CAD, planning of further management along with potential future management, and discussion with patient and family answering all questions.    Frann Rider, AGNP-BC  [facility] [address] [place], [place] [address]  Phone [phone] Fax [phone] Note: This document was prepared  with digital dictation and possible smart phrase technology. Any transcriptional errors that result from this process are unintentional.

## 2020-01-25 ENCOUNTER — Encounter: Payer: Medicare HMO | Admitting: Occupational Therapy

## 2020-01-25 ENCOUNTER — Ambulatory Visit: Payer: Medicare HMO | Admitting: Physical Therapy

## 2020-01-25 ENCOUNTER — Other Ambulatory Visit: Payer: Self-pay

## 2020-01-25 DIAGNOSIS — I69351 Hemiplegia and hemiparesis following cerebral infarction affecting right dominant side: Secondary | ICD-10-CM

## 2020-01-25 DIAGNOSIS — R2681 Unsteadiness on feet: Secondary | ICD-10-CM

## 2020-01-25 DIAGNOSIS — R2689 Other abnormalities of gait and mobility: Secondary | ICD-10-CM

## 2020-01-25 DIAGNOSIS — R278 Other lack of coordination: Secondary | ICD-10-CM

## 2020-01-25 DIAGNOSIS — M6281 Muscle weakness (generalized): Secondary | ICD-10-CM

## 2020-01-26 NOTE — Therapy (Signed)
[facility] [address] [place], [place], [address] Phone: [phone]   Fax:  [phone]  Physical Therapy Treatment  Patient Details  Name: Luke Rivera MRN: [id number] Date of Birth: January 06, 1941 Referring Provider (PT): Frann Rider, NP   Encounter Date: 01/25/2020  PT End of Session - 01/26/20 1217    Visit Number  22    Number of Visits  24    Date for PT Re-Evaluation  03/02/20   written for 4-5 week POC   Authorization Type  [facility]    PT Start Time  1615    PT Stop Time  1700    PT Time Calculation (min)  45 min    Equipment Utilized During Treatment  Gait belt    Activity Tolerance  Patient tolerated treatment well    Behavior During Therapy  Flat affect;WFL for tasks assessed/performed       Past Medical History:  Diagnosis Date  . CAD (coronary artery disease)   . CHF (congestive heart failure) ([place])   . Chronic anticoagulation   . DM2 (diabetes mellitus, type 2) ([place])   . DVT (deep vein thrombosis) in pregnancy   . GERD (gastroesophageal reflux disease)   . HTN (hypertension)   . TIA (transient ischemic attack)     No past surgical history on file.  There were no vitals filed for this visit.        01/25/20 1622  Symptoms/Limitations  Subjective No new falls. Went to the doctor yesterday and discussed about wearing his AFO. Gets his 2nd covid shot tomorrow.  Patient is accompained by: Family member (spouse)  How long can you stand comfortably? 10 min  How long can you walk comfortably? 10 min  Patient Stated Goals get back to where he used to be  Pain Assessment  Currently in Pain? No/denies  Pain Onset More than a month ago                01/25/20 0001  Transfers  Transfers Sit to Stand;Stand to Sit  Sit to Stand 4: Min guard  Five time sit to stand comments  10 sit <> stands from 21.5" table with no UE support, cues for anterior weight shift and eccentric control     Comments 30 second chair stand: 1st rep 8 sit <> stands from mat table with BUE support, 2nd rep 8 with BUE support   Ambulation/Gait  Ambulation/Gait Yes  Ambulation/Gait Assistance 5: Supervision  Ambulation/Gait Assistance Details cues for posture  Ambulation Distance (Feet) 115 Feet  Assistive device Rolling walker;Other (Comment) (R AFO)  Gait Pattern Step-through pattern;Decreased arm swing - right;Decreased step length - left;Decreased stance time - right;Decreased stride length;Decreased hip/knee flexion - right;Right genu recurvatum;Trunk flexed (R external rotation of LE)  Ambulation Surface Level;Indoor  Exercises  Exercises Other Exercises  Other Exercises  With pt on wedge: supine on mat table, PROM into R hip IR and ER, pt limited significantly in R hip IR, with holds at pt's end range for a stretch, supine modified Jonavan test hip flexor stretch, with therapist supporting pt's RLE off mat table, 3 x 45 seconds.   Knee/Hip Exercises: Standing  Forward Step Up Limitations  Forward Step Up Limitations attempted forward step ups at stair case with BUE support - pt reporting increased R shoulder pain when holding onto railings  Other Standing Knee Exercises with red theraband around thighs - cues for keeping a "soft" knee on R, and gently taking side  steps out to L for R hip ABD and incr knee control, pt with increased difficulty and needing demo and verbal cues how to perform correctly 2 x 8 reps  Knee Flexion Strengthening;Right;2 sets;10 reps  Knee Flexion Limitations standing with BUE support - with therapist resisting with red theraband, cues for posture when performing              PT Education - 01/27/20 1040    Education Details  wearing R AFO during all mobility due to pt's risk of falls and decr ability to clear RLE    Person(s) Educated  Patient;Spouse    Methods  Explanation    Comprehension  Verbalized understanding       PT Short Term Goals -  11/09/19 1550      PT SHORT TERM GOAL #1   Title  Pt will be I and compliant with HEP. (STG target date 4 weeks 11/11/19)    Baseline  not consistent yet    Status  On-going      PT SHORT TERM GOAL #2   Title  Pt will improve 5TSTS test to <40 seconds    Baseline  scored 26 seconds when retested on 12/1    Status  Achieved      PT SHORT TERM GOAL #3   Title  Pt will improve TUG to less than 40 seconds    Baseline  improved to 30 sec on 11/09/19    Status  Achieved      PT SHORT TERM GOAL #4   Title  Pt will improve gait speed to >1 ft/sec with LRAD    Baseline  improved to 1 ft/sec on 11/09/19    Status  Achieved        PT Long Term Goals - 01/02/20 1746      PT LONG TERM GOAL #1   Title  Pt will improve TUG to less than 20 seconds with RW to decrease fall risk. ALL LTGS DUE 01/30/20    Baseline  24.97 seconds on 1/22/1 with RW    Time  4    Period  Weeks    Status  Revised    Target Date  01/30/20      PT LONG TERM GOAL #2   Title  Pt will improve 5TSTS test from standard height chair with BUE support to 22 seconds or less to demo improved functional LE strength.    Baseline  26.14 seconds with BUE support from chair    Time  4    Period  Weeks    Status  Revised      PT LONG TERM GOAL #3   Title  Pt will improve gait speed greater than 1.3 ft/sec with RW to improve community mobility.    Baseline  33.84 seconds = .97 ft/sec    Time  4    Period  Weeks    Status  Revised      PT LONG TERM GOAL #4   Title  Pt will be independent with final HEP in order to build upon functional gains made in therapy.    Time  4    Period  Weeks    Status  New      PT LONG TERM GOAL #5   Title  Pt will be able to negotiate at least one flight of stairs with only one handrail vs. B handrails using step to pattern and supversion in order to visit with friends/family or have better access  to community    Baseline  8 steps with B handrail with min A/min guard and step to pattern     Time  4    Period  Weeks    Status  Revised            Plan - 01/27/20 1035    Clinical Impression Statement  Today's skilled session focused on LE strengthening (R>L) and stretching on R hip. Pt reports relief from supine hip flexor stretch, educated on importance of continued use of performing at home. Pt improving with sit <> stands and endurance as noted by 30 second chair stand today, HR reached approx. 115 bpm afterwards. Needed cues for posture throughout session. Will continue to progress towards LTGs.    Personal Factors and Comorbidities  Age;Past/Current Experience    Examination-Activity Limitations  Bathing;Transfers;Locomotion Level;Reach Overhead;Bed Mobility;Bend;Self Feeding;Carry;Squat;Dressing;Stairs;Hygiene/Grooming;Stand;Lift;Toileting    Examination-Participation Restrictions  Meal Prep;Cleaning;Community Activity;Driving;Shop;Laundry;Yard Work    [employer]chant [employer] officer  Evolving/Moderate complexity    Rehab Potential  Good    PT Frequency  1x / week    PT Duration  4 weeks   4-5 weeks   PT Treatment/Interventions  ADLs/Self Care Home Management;Aquatic Therapy;Cryotherapy;English as a second language teacher;Therapeutic activities;Therapeutic exercise;Balance training;Neuromuscular re-education;Manual techniques;Orthotic Fit/Training;Patient/family education;Passive range of motion;Dry needling;Energy conservation;Splinting;Joint Manipulations;Taping    PT Next Visit Plan  check LTGs, finalize HEP for D/C.    PT Home Exercise Plan  Access Code: [email or web address]    Consulted and Agree with Plan of Care  Patient       Patient will benefit from skilled therapeutic intervention in order to improve the following deficits and impairments:  Abnormal gait, Decreased activity tolerance, Decreased balance, Decreased coordination, Decreased endurance, Decreased mobility, Decreased range of motion, Decreased strength, Difficulty walking,  Hypomobility, Increased fascial restricitons, Increased muscle spasms, Impaired flexibility, Postural dysfunction, Pain  Visit Diagnosis: Hemiplegia and hemiparesis following cerebral infarction affecting right dominant side (HCC)  Muscle weakness (generalized)  Unsteadiness on feet  Other lack of coordination  Other abnormalities of gait and mobility     Problem List Patient Active Problem List   Diagnosis Date Noted  . Dysphagia, post-stroke   . Diabetes mellitus type 2 in obese ([place])   . Acute blood loss anemia   . Hypokalemia   . Diabetic peripheral neuropathy ([place])   . Right knee pain   . Acute ischemic right MCA stroke ([place]) 06/07/2019  . AMS (altered mental status)   . Incidental pulmonary nodule, > 18mm and < 55mm 06/01/2019  . CVA (cerebral vascular accident) ([place]) 05/31/2019  . 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 Onychomycosis 01/11/2015  . Pain in lower limb 01/11/2015  . Hammer toe of right foot 01/02/2015  . Pain in right foot 01/02/2015    Arliss Journey, PT, DPT  01/27/2020, 10:40 AM  [facility] [address] [place], [place], [address] Phone: [phone]   Fax:  [phone]  Name: Sholem Venhuizen MRN: [id number] Date of Birth: Oct 16, 1941

## 2020-01-27 NOTE — Progress Notes (Signed)
I agree with the above plan 

## 2020-02-01 ENCOUNTER — Other Ambulatory Visit: Payer: Self-pay

## 2020-02-01 ENCOUNTER — Ambulatory Visit: Payer: Medicare HMO | Admitting: Physical Therapy

## 2020-02-01 ENCOUNTER — Encounter: Payer: Medicare HMO | Admitting: Occupational Therapy

## 2020-02-01 ENCOUNTER — Encounter: Payer: Self-pay | Admitting: Physical Therapy

## 2020-02-01 DIAGNOSIS — I69351 Hemiplegia and hemiparesis following cerebral infarction affecting right dominant side: Secondary | ICD-10-CM | POA: Diagnosis not present

## 2020-02-01 DIAGNOSIS — R2689 Other abnormalities of gait and mobility: Secondary | ICD-10-CM

## 2020-02-01 DIAGNOSIS — R278 Other lack of coordination: Secondary | ICD-10-CM

## 2020-02-01 DIAGNOSIS — M6281 Muscle weakness (generalized): Secondary | ICD-10-CM

## 2020-02-01 DIAGNOSIS — R2681 Unsteadiness on feet: Secondary | ICD-10-CM

## 2020-02-01 NOTE — Patient Instructions (Signed)
Access Code: [email or web address]  URL: [email or web address]  Date: 02/01/2020  Prepared by: Janann August   Exercises Supine Bridge - 10 reps - 1-2 sets - 5 hold - 2x daily - 6x weekly Small Range Straight Leg Raise - 5 reps - 2 sets - 1x daily - 6x weekly Clamshell - 10 reps - 2 sets - 2x daily - 6x weekly Sit to Stand with Armchair - 5 reps - 1-2 sets - 2x daily - 6x weekly                  Standing March with Counter Support - 10 reps - 1-2 sets - 2x daily - 6x weekly Side Stepping with Counter Support - 3-5 reps - 1 sets - 2x daily - 6x weekly Standing Tandem Balance with Counter Support - 3 reps - 1 sets - 30 hold - 2x daily - 6x weekly Standing Hip Abduction with Counter Support - 10 reps - 2 sets - 3x daily - 6x weekly Modified Braxtin Stretch - 3 sets - 30 hold - 1x daily - 5x weekly

## 2020-02-01 NOTE — Therapy (Signed)
[facility] [address] [place], [place], [address] Phone: [phone]   Fax:  [phone]  Physical Therapy Treatment/Discharge Summary   Patient Details  Name: Luke Rivera MRN: [id number] Date of Birth: Jul 29, 1941 Referring Provider (PT): Frann Rider, NP   Encounter Date: 02/01/2020  PT End of Session - 02/01/20 2211    Visit Number  23    Number of Visits  24    Date for PT Re-Evaluation  03/02/20   written for 4-5 week POC   Authorization Type  [employer] [facility]    PT Start Time  1615    PT Stop Time  1700    PT Time Calculation (min)  45 min    Equipment Utilized During Treatment  Gait belt    Activity Tolerance  Patient tolerated treatment well    Behavior During Therapy  [facility] for tasks assessed/performed;Flat affect       Past Medical History:  Diagnosis Date  . CAD (coronary artery disease)   . CHF (congestive heart failure) ([place])   . Chronic anticoagulation   . DM2 (diabetes mellitus, type 2) ([place])   . DVT (deep vein thrombosis) in pregnancy   . GERD (gastroesophageal reflux disease)   . HTN (hypertension)   . TIA (transient ischemic attack)     History reviewed. No pertinent surgical history.  There were no vitals filed for this visit.  Subjective Assessment - 02/01/20 1619    Subjective  Nothing new. No falls.    Patient is accompained by:  Family member   spouse   How long can you stand comfortably?  10 min    How long can you walk comfortably?  10 min    Patient Stated Goals  get back to where he used to be    Currently in Pain?  No/denies    Pain Onset  More than a month ago         [facility] PT Assessment - 02/01/20 1624      Transfers   Transfers  Sit to Stand;Stand to Sit    Sit to Stand  5: Supervision;With upper extremity assist    Stand to Sit  5: Supervision      Ambulation/Gait   Ambulation/Gait  Yes    Ambulation/Gait Assistance  5: Supervision    Ambulation/Gait Assistance  Details  throughout session with RW    Assistive device  Rolling walker;Other (Comment)   R AFO   Gait Pattern  Step-through pattern;Decreased arm swing - right;Decreased step length - left;Decreased stance time - right;Decreased stride length;Decreased hip/knee flexion - right;Right genu recurvatum;Trunk flexed   external rotation of RLE   Ambulation Surface  Level;Indoor    Gait velocity  24.41 seconds = 1.34 ft/sec    Stairs  Yes    Stairs Assistance  5: Supervision;4: Min guard    Stairs Assistance Details (indicate cue type and reason)  1st rep: ascending with step over step pattern, educated on technique of step to pattern for safety and reviewed descending stairs with weaker RLE first - cues to try to keep R foot neutral due to pt having incr external rotation     Stair Management Technique  Alternating pattern;Step to pattern;Forwards;Two rails      Timed Up and Go Test   Normal TUG (seconds)  24    TUG Comments  with RW                   [facility] Adult PT  Treatment/Exercise - 02/01/20 1624      Transfers   Five time sit to stand comments   19.65 seconds with BUE support from chair            Access Code: [email or web address]  URL: [email or web address]  Date: 02/01/2020  Prepared by: Janann August   Reviewed pt's final HEP, cues for technique for strengthening exercises, especially to help decrease external rotation of RLE during straight leg raises and standing hip ABD and side stepping.   Exercises Supine Bridge - 10 reps - 1-2 sets - 5 hold - 2x daily - 6x weekly Small Range Straight Leg Raise - 5 reps - 2 sets - 1x daily - 6x weekly Clamshell - 10 reps - 2 sets - 2x daily - 6x weekly Sit to Stand with Armchair - 5 reps - 1-2 sets - 2x daily - 6x weekly                  Standing March with Counter Support - 10 reps - 1-2 sets - 2x daily - 6x weekly Side Stepping with Counter Support - 3-5 reps - 1 sets - 2x daily - 6x weekly Standing Tandem Balance  with Counter Support - 3 reps - 1 sets - 30 hold - 2x daily - 6x weekly Standing Hip Abduction with Counter Support - 10 reps - 2 sets - 3x daily - 6x weekly Modified Dylan Stretch - 3 sets - 30 hold - 1x daily - 5x weekly    PT Short Term Goals - 11/09/19 1550      PT SHORT TERM GOAL #1   Title  Pt will be I and compliant with HEP. (STG target date 4 weeks 11/11/19)    Baseline  not consistent yet    Status  On-going      PT SHORT TERM GOAL #2   Title  Pt will improve 5TSTS test to <40 seconds    Baseline  scored 26 seconds when retested on 12/1    Status  Achieved      PT SHORT TERM GOAL #3   Title  Pt will improve TUG to less than 40 seconds    Baseline  improved to 30 sec on 11/09/19    Status  Achieved      PT SHORT TERM GOAL #4   Title  Pt will improve gait speed to >1 ft/sec with LRAD    Baseline  improved to 1 ft/sec on 11/09/19    Status  Achieved        PT Long Term Goals - 02/01/20 1623      PT LONG TERM GOAL #1   Title  Pt will improve TUG to less than 20 seconds with RW to decrease fall risk. ALL LTGS DUE 01/30/20    Baseline  24 seconds on 02/01/20    Time  4    Period  Weeks    Status  Not Met      PT LONG TERM GOAL #2   Title  Pt will improve 5TSTS test from standard height chair with BUE support to 22 seconds or less to demo improved functional LE strength.    Baseline  19.65 seconds with BUE support from chair    Time  4    Period  Weeks    Status  Achieved      PT LONG TERM GOAL #3   Title  Pt will improve gait speed greater than 1.3 ft/sec with RW to improve community mobility.  Baseline  24.41 seconds = 1.34 ft/sec    Time  4    Period  Weeks    Status  Achieved      PT LONG TERM GOAL #4   Title  Pt will be independent with final HEP in order to build upon functional gains made in therapy.    Time  4    Period  Weeks    Status  Achieved      PT LONG TERM GOAL #5   Title  Pt will be able to negotiate at least one flight of stairs with  only one handrail vs. B handrails using step to pattern and supversion in order to visit with friends/family or have better access to community    Baseline  8 steps with B handrail with step to and supervision ascending, min guard descending.    Time  4    Period  Weeks    Status  Partially Met           PHYSICAL THERAPY DISCHARGE SUMMARY  Visits from Start of Care: 23  Current functional level related to goals / functional outcomes: See LTGs.   Remaining deficits: decr functional LE strength, impaired balance, gait abnormalities   Education / Equipment: HEP   Plan: Patient agrees to discharge.  Patient goals were met. Patient is being discharged due to meeting the stated rehab goals.  ?????       Plan - 02/01/20 2217    Clinical Impression Statement  Focus of today's skilled setssion was assessing pt's LTGs for D/C. Pt has met 3 out of 5 LTGs. Pt improved his 5x sit <> stand time to 19.65 seconds and improved gait speed with RW to 1.3 ft/sec (previously .97 ft/sec). Pt did not meet TUG time - performed today at 24 seconds with RW, which is approximately the same time when assessed about a month ago. Pt partially met LTG #5 - able to ascend steps with supervision with step to pattern and descend with min guard, needed initially cueing for technique. Reviewed pt's HEP with pt able to demo understanding, needing initial cues for technique. Pt is pleased with his progress with PT and will be discharged at this time.    Personal Factors and Comorbidities  Age;Past/Current Experience    Examination-Activity Limitations  Bathing;Transfers;Locomotion Level;Reach Overhead;Bed Mobility;Bend;Self Feeding;Carry;Squat;Dressing;Stairs;Hygiene/Grooming;Stand;Lift;Toileting    Examination-Participation Restrictions  Meal Prep;Cleaning;Community Activity;Driving;Shop;Laundry;Yard Work    Merchant [employer] officer  Evolving/Moderate complexity    Rehab Potential  Good    PT Frequency  1x  / week    PT Duration  4 weeks   4-5 weeks   PT Treatment/Interventions  ADLs/Self Care Home Management;Aquatic Therapy;Cryotherapy;English as a second language teacher;Therapeutic activities;Therapeutic exercise;Balance training;Neuromuscular re-education;Manual techniques;Orthotic Fit/Training;Patient/family education;Passive range of motion;Dry needling;Energy conservation;Splinting;Joint Manipulations;Taping    PT Next Visit Plan  D/C    PT Home Exercise Plan  Access Code: [email or web address]    Consulted and Agree with Plan of Care  Patient       Patient will benefit from skilled therapeutic intervention in order to improve the following deficits and impairments:  Abnormal gait, Decreased activity tolerance, Decreased balance, Decreased coordination, Decreased endurance, Decreased mobility, Decreased range of motion, Decreased strength, Difficulty walking, Hypomobility, Increased fascial restricitons, Increased muscle spasms, Impaired flexibility, Postural dysfunction, Pain  Visit Diagnosis: Hemiplegia and hemiparesis following cerebral infarction affecting right dominant side (HCC)  Muscle weakness (generalized)  Unsteadiness on feet  Other lack of coordination  Other abnormalities of gait and mobility  Problem List Patient Active Problem List   Diagnosis Date Noted  . Dysphagia, post-stroke   . Diabetes mellitus type 2 in obese ([place])   . Acute blood loss anemia   . Hypokalemia   . Diabetic peripheral neuropathy ([place])   . Right knee pain   . 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 AMS (altered mental status)   . Incidental pulmonary nodule, > 34m and < 862m06/23/2020  . CVA (cerebral vascular accident) ([place]/22/[place]  . 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 Onychomycosis 01/11/2015  . Pain in lower limb 01/11/2015  . Hammer toe of right foot 01/02/2015  . Pain in right foot 01/02/2015    ChArliss JourneyPT, DPT  02/01/2020, 10:18 PM  [place]hone: [phone] Fax:  [phone]: Luke GranadeRN: [id number] of Birth: 02/1941/03/27

## 2020-02-02 ENCOUNTER — Ambulatory Visit: Payer: Medicare HMO | Admitting: Adult Health

## 2020-02-08 ENCOUNTER — Other Ambulatory Visit: Payer: Self-pay | Admitting: Physical Medicine & Rehabilitation

## 2020-02-10 ENCOUNTER — Encounter: Payer: Medicare HMO | Attending: Physical Medicine & Rehabilitation | Admitting: Psychology

## 2020-02-10 ENCOUNTER — Other Ambulatory Visit: Payer: Self-pay

## 2020-02-10 DIAGNOSIS — I69351 Hemiplegia and hemiparesis following cerebral infarction affecting right dominant side: Secondary | ICD-10-CM

## 2020-02-10 DIAGNOSIS — F4323 Adjustment disorder with mixed anxiety and depressed mood: Secondary | ICD-10-CM | POA: Diagnosis not present

## 2020-02-10 DIAGNOSIS — I69920 Aphasia following unspecified cerebrovascular disease: Secondary | ICD-10-CM

## 2020-02-17 ENCOUNTER — Encounter: Payer: Self-pay | Admitting: Psychology

## 2020-02-17 NOTE — Progress Notes (Signed)
Neuropsychological Consultation   Patient:   Luke Rivera   DOB:   1941/05/21  MR Number:  [id number]  Location:  [facility] [place], [place] [id number] [place] [address] Dept: [phone]           Date of Service:   02/10/2020  Start Time:   3 PM End Time:   4 PM  Provider/Observer:  Ilean Skill, Psy.D.       Clinical Neuropsychologist       Billing Code/Service: 96158/96159  Chief Complaint:    Luke Rivera is an 79 year old male who has a history of CAD/CAF, DVT, TIA, type 2 diabetes with peripheral neuropathy, multiple back surgeries with limited mobility who was initially seen by our treatment team on the inpatient unit after being admitted on 05/31/2019.  The patient initially had garbled speech and right-sided weakness.  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  The patient has continued to have issues with right-sided motor deficits that are improving significantly and continued expressive language issues.  The patient has been followed by Dr. Kasandra Knudsen with [facility] as well as Dr. Read Drivers with physical medicine.  He has also been continued to see rehabilitation services for PT/OT/speech.  Reason for Service:  Luke Rivera is an 79 year old male who has a history of CAD/CAF, DVT, TIA, type 2 diabetes with peripheral neuropathy, multiple back surgeries with limited mobility who was initially seen by our treatment team on the inpatient unit after being admitted on 05/31/2019.  The patient initially had garbled speech and right-sided weakness.  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  The patient  has continued to have issues with right-sided motor deficits that are improving significantly and continued expressive language issues.  The patient has been followed by Dr. Kasandra Knudsen with [facility] neurolog[facility] as well as Dr. Elnita Maxwell with physical medicine.  He has also been continued to see rehabilitation services for PT/OT/speech.  The patient has continued with significant mobility issues, significant pain in his right shoulder which they are evaluating as to whether it is neurological pain in nature or related to some orthopedic issues, problems with his motor function in his hand post stroke as well as changes in expressive language function.  However, memory and other cognitive functioning appears to remain intact.  His stroke And on May 31, 2019.  There is also indications of previous TIA.  The patient had increased irritation and irritability during his hospital course and this persisted after discharge.  Dr. Letta Pate has started him on Celexa, and the patient's wife reports that he had a very positive response to this medication and has continued to take it.  However, the patient has had times of significant motivation and effort concerns that are vocalized by both the patient's wife as well as having an impact on some of his therapeutic efforts.  The patient is continued with rehabilitation services since his discharge in July of this year.  He has been doing rehab with the [facility] at home and now he is working with [facility] rehab in [place] doing both OT, PT and speech therapy.  The patient reports that he sleeps from 11 PM at night to 9  or 10 in the morning and feels like his sleep is adequate.  The patient has a good appetite most days and describes his memory is within normal limits and at baseline.  Current Status:  The patient continues to have residual motor deficits particularly with his right hand.  He has a long history of multiple back surgeries that also have an impact on his  mobility.  The patient continues to have some expressive language deficits but these have significantly improved and does continue to have some fluency and word finding issues but overall he is able to effectively communicate and express himself.  Depressive symptoms have improved with SSRI medication but continued to be an issue and having a negative impact on both therapeutic interventions as well as behavioral impacts at home.  The patient's wife reports that there are days where he simply does not feel well and will do little but sit in his recliner.  He has had some GI symptoms that he attributes his inactivity to at times.  Reliability of Information: Information is derived from 1 hour face-to-face clinical interview as well as review of medical records.  Behavioral Observation: [name]  presents as a 79 y.o.-year-old Right Caucasian Male who appeared his stated age. his dress was Appropriate and he was Well Groomed and his manners were Appropriate to the situation.  his participation was indicative of Appropriate and Attentive behaviors.  There were any physical disabilities noted.  he displayed an appropriate level of cooperation and motivation.     Interactions:    Active Appropriate and Attentive  Attention:   within normal limits and attention span and concentration were age appropriate  Memory:   within normal limits; recent and remote memory intact  Visuo-spatial:  not examined  Speech (Volume):  low  Speech:   fluent aphasia; mild word finding and verbal fluency issues  Thought Process:  Coherent and Relevant  Though Content:  WNL; not suicidal and not homicidal  Orientation:   person, place, time/date and situation  Judgment:   Good  Planning:   Good  Affect:    Appropriate  Mood:    Dysphoric  Insight:   Fair  Intelligence:   high  Marital Status/Living: The patient was born and raised in [place] with 1 brother.  The patient lives with his wife  and they have been married for 15 years.  The patient has been married previously with first marriage lasting 29 years until his wife passed away.  The patient has 3 daughters.  Current Employment: The patient is retired.  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  Hobbies and interests include golf and listening to music.  The patient was in [employer] between [place].  His highest rank and rank at discharge was E4 the patient had an honorable discharge.  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  Substance Use:  No concerns of substance abuse are reported.    Education:   HS Graduate  Medical History:   Past Medical History:  Diagnosis Date  . CAD (coronary artery disease)   . CHF (congestive heart failure) ([place])   . Chronic anticoagulation   . DM2 (diabetes mellitus, type 2) ([place])   . DVT (deep vein thrombosis) in pregnancy   . GERD (gastroesophageal reflux disease)   . HTN (hypertension)   . TIA (transient ischemic attack)      Psychiatric History:  The patient  has no prior psychiatric history  Family Med/Psych History:  Family History  Problem Relation Age of Onset  . Hypertension Father   . Heart disease Mother   . Heart attack Brother     Impression/DX:  Rommy Alford is an 79 year old male who has a history of CAD/CAF, DVT, TIA, type 2 diabetes with peripheral neuropathy, multiple back surgeries with limited mobility who was initially seen by our treatment team on the inpatient unit after being admitted on 05/31/2019.  The patient initially had garbled speech and right-sided weakness.  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  The patient has continued to have issues with right-sided motor deficits that are improving significantly and continued expressive language issues.  The  patient has been followed by Dr. Kasandra Knudsen with [facility] as well as Dr. Read Drivers with physical medicine.  He has also been continued to see rehabilitation services for PT/OT/speech.  The patient continues to have residual motor deficits particularly with his right hand.  He has a long history of multiple back surgeries that also have an impact on his mobility.  The patient continues to have some expressive language deficits but these have significantly improved and does continue to have some fluency and word finding issues but overall he is able to effectively communicate and express himself.  Depressive symptoms have improved with SSRI medication but continued to be an issue and having a negative impact on both therapeutic interventions as well as behavioral impacts at home.  The patient's wife reports that there are days where he simply does not feel well and will do little but sit in his recliner.  He has had some GI symptoms that he attributes his inactivity to at times.  Disposition/Plan:  We have continue to work on therapeutic interventions around depression, and adjustment issues that are significantly affecting his overall functioning.  Hemiparesis and aphasia continue to be quite problematic.  The patient was able to fully participate in today's visit and we continue to work on therapeutic interventions for residual effects of his cerebrovascular accident.  Diagnosis:    Hemiparesis affecting right side as late effect of cerebrovascular accident (CVA) ([place])  Aphasia, late effect of cerebrovascular disease         Electronically Signed   _______________________ Ilean Skill, Psy.D.

## 2020-02-18 ENCOUNTER — Ambulatory Visit: Payer: Medicare HMO | Admitting: Physical Medicine & Rehabilitation

## 2020-02-25 ENCOUNTER — Other Ambulatory Visit: Payer: Self-pay | Admitting: Physical Medicine & Rehabilitation

## 2020-02-29 ENCOUNTER — Encounter (HOSPITAL_BASED_OUTPATIENT_CLINIC_OR_DEPARTMENT_OTHER): Payer: Medicare HMO | Admitting: Psychology

## 2020-02-29 ENCOUNTER — Other Ambulatory Visit: Payer: Self-pay

## 2020-02-29 DIAGNOSIS — F4323 Adjustment disorder with mixed anxiety and depressed mood: Secondary | ICD-10-CM | POA: Diagnosis not present

## 2020-02-29 DIAGNOSIS — I69920 Aphasia following unspecified cerebrovascular disease: Secondary | ICD-10-CM | POA: Diagnosis not present

## 2020-02-29 DIAGNOSIS — I69351 Hemiplegia and hemiparesis following cerebral infarction affecting right dominant side: Secondary | ICD-10-CM

## 2020-03-02 ENCOUNTER — Encounter: Payer: Self-pay | Admitting: Psychology

## 2020-03-02 NOTE — Progress Notes (Signed)
Neuropsychological Consultation   Patient:   Luke Rivera   DOB:   10/15/1941  MR Number:  [id number]  Location:  [facility] [place], [place] [id number] [place] [address] Dept: [phone]           Date of Service:   02/29/2020  Start Time:   10 AM End Time:   11 AM  Provider/Observer:  Ilean Skill, Psy.D.       Clinical Neuropsychologist       Billing Code/Service: 96158/[id number]  Chief Complaint:    Luke Rivera is an 79 year old male who has a history of CAD/CAF, DVT, TIA, type 2 diabetes with peripheral neuropathy, multiple back surgeries with limited mobility who was initially seen by our treatment team on the inpatient unit after being admitted on 05/31/2019.  The patient initially had garbled speech and right-sided weakness.  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  The patient has continued to have issues with right-sided motor deficits that are improving significantly and continued expressive language issues.  The patient has been followed by Luke Rivera with [facility] as well as Luke Rivera with physical medicine.  He has also been continued to see rehabilitation services for PT/OT/speech.  Reason for Service:  Luke Rivera is an 79 year old male who has a history of CAD/CAF, DVT, TIA, type 2 diabetes with peripheral neuropathy, multiple back surgeries with limited mobility who was initially seen by our treatment team on the inpatient unit after being admitted on 05/31/2019.  The patient initially had garbled speech and right-sided weakness.  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  The  patient has continued to have issues with right-sided motor deficits that are improving significantly and continued expressive language issues.  The patient has been followed by Luke Rivera with [facility] as well as Dr. Elnita Maxwell with physical medicine.  He has also been continued to see rehabilitation services for PT/OT/speech.  The patient has continued with significant mobility issues, significant pain in his right shoulder which they are evaluating as to whether it is neurological pain in nature or related to some orthopedic issues, problems with his motor function in his hand post stroke as well as changes in expressive language function.  However, memory and other cognitive functioning appears to remain intact.  His stroke And on May 31, 2019.  There is also indications of previous TIA.  The patient had increased irritation and irritability during his hospital course and this persisted after discharge.  Luke Rivera has started him on Celexa, and the patient's wife reports that he had a very positive response to this medication and has continued to take it.  However, the patient has had times of significant motivation and effort concerns that are vocalized by both the patient's wife as well as having an impact on some of his therapeutic efforts.  The patient is continued with rehabilitation services since his discharge in July of this year.  He has been doing rehab with the [facility] system at home and now he is working with [facility] rehab in [place] doing both OT, PT and speech therapy.  The patient reports that he sleeps from 11 PM at night to 9  or 10 in the morning and feels like his sleep is adequate.  The patient has a good appetite most days and describes his memory is within normal limits and at baseline.  Current Status:  The patient continues to describe residual motor deficits that are resulting from a combination of multiple back surgeries prior as well as his recent  cerebrovascular accident.  These motor deficits are having a significant impact on his mobility and reduction in functioning create increased frustration and motivation issues.  The patient does have some expressive language deficits but there have been significant improvements in the patient maintains her continues to have some fluency and word finding issues but not to the level that leave him unable to effectively communicate and express himself.  Depressive symptoms have improved with SSRI medications.  He is actively working on some of the therapeutic interventions we have discussed.  Reliability of Information: Information is derived from 1 hour face-to-face clinical interview as well as review of medical records.  Behavioral Observation: Luke Rivera  presents as a 79 y.o.-year-old Right Caucasian Male who appeared his stated age. his dress was Appropriate and he was Well Groomed and his manners were Appropriate to the situation.  his participation was indicative of Appropriate and Attentive behaviors.  There were any physical disabilities noted.  he displayed an appropriate level of cooperation and motivation.     Interactions:    Active Appropriate and Attentive  Attention:   within normal limits and attention span and concentration were age appropriate  Memory:   within normal limits; recent and remote memory intact  Visuo-spatial:  not examined  Speech (Volume):  low  Speech:   fluent aphasia; mild word finding and verbal fluency issues  Thought Process:  Coherent and Relevant  Though Content:  WNL; not suicidal and not homicidal  Orientation:   person, place, time/date and situation  Judgment:   Good  Planning:   Good  Affect:    Appropriate  Mood:    Dysphoric  Insight:   Fair  Intelligence:   high  Marital Status/Living: The patient was born and raised in [place] with 1 brother.  The patient lives with his wife and they have been married for 15 years.   The patient has been married previously with first marriage lasting 101 years until his wife passed away.  The patient has 3 daughters.  Current Employment: The patient is retired.  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  Hobbies and interests include golf and listening to music.  The patient was in [employer] between [place].  His highest rank and rank at discharge was E4 the patient had an honorable discharge.  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  Substance Use:  No concerns of substance abuse are reported.    Education:   HS Graduate  Medical History:   Past Medical History:  Diagnosis Date  . CAD (coronary artery disease)   . CHF (congestive heart failure) ([place])   . Chronic anticoagulation   . DM2 (diabetes mellitus, type 2) ([place])   . DVT (deep vein thrombosis) in pregnancy   . GERD (gastroesophageal reflux disease)   . HTN (hypertension)   . TIA (transient ischemic attack)      Psychiatric History:  The patient has no prior psychiatric history  Family Med/Psych History:  Family History  Problem Relation Age of Onset  . Hypertension Father   .  Heart disease Mother   . Heart attack Brother     Impression/DX:  Luke Rivera is an 79 year old male who has a history of CAD/CAF, DVT, TIA, type 2 diabetes with peripheral neuropathy, multiple back surgeries with limited mobility who was initially seen by our treatment team on the inpatient unit after being admitted on 05/31/2019.  The patient initially had garbled speech and right-sided weakness.  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  The patient has continued to have issues with right-sided motor deficits that are improving significantly and continued expressive language issues.  The patient has been followed by Luke Rivera with  [facility] neurolog[facility] as well as Luke Rivera with physical medicine.  He has also been continued to see rehabilitation services for PT/OT/speech.  The patient continues to have residual motor deficits particularly with his right hand.  He has a long history of multiple back surgeries that also have an impact on his mobility.  The patient continues to have some expressive language deficits but these have significantly improved and does continue to have some fluency and word finding issues but overall he is able to effectively communicate and express himself.  Depressive symptoms have improved with SSRI medication but continued to be an issue and having a negative impact on both therapeutic interventions as well as behavioral impacts at home.  The patient's wife reports that there are days where he simply does not feel well and will do little but sit in his recliner.  He has had some GI symptoms that he attributes his inactivity to at times.  Disposition/Plan:  We are continue to work on therapeutic interventions around depression and adjustment issues.  The patient significant motor changes and residual hemiparesis and improving but continued aphasia continue to be quite problematic and affecting his coping abilities.  The patient fully participated in today's visit and we have continue to work on therapeutic interventions utilizing cognitive/behavioral therapeutic interventions and working on adjustment and coping skills.  Diagnosis:    Hemiparesis affecting right side as late effect of cerebrovascular accident (CVA) ([place])  Aphasia, late effect of cerebrovascular disease         Electronically Signed   _______________________ Ilean Skill, Psy.D.

## 2020-03-13 ENCOUNTER — Other Ambulatory Visit: Payer: Self-pay

## 2020-03-13 ENCOUNTER — Encounter: Payer: Medicare HMO | Attending: Physical Medicine & Rehabilitation | Admitting: Psychology

## 2020-03-13 DIAGNOSIS — I69920 Aphasia following unspecified cerebrovascular disease: Secondary | ICD-10-CM

## 2020-03-13 DIAGNOSIS — I69351 Hemiplegia and hemiparesis following cerebral infarction affecting right dominant side: Secondary | ICD-10-CM

## 2020-03-13 DIAGNOSIS — F4323 Adjustment disorder with mixed anxiety and depressed mood: Secondary | ICD-10-CM | POA: Diagnosis not present

## 2020-03-13 DIAGNOSIS — M7501 Adhesive capsulitis of right shoulder: Secondary | ICD-10-CM | POA: Diagnosis not present

## 2020-03-27 ENCOUNTER — Encounter: Payer: Self-pay | Admitting: Psychology

## 2020-03-27 NOTE — Progress Notes (Signed)
Neuropsychological Consultation   Patient:   Luke Rivera   DOB:   December 16, 1940  MR Number:  [id number]  Location:  [facility] [place] [place], [place] [id number] [place] [address] Dept: [phone]           Date of Service:   03/15/2020  Start Time:   10 AM End Time:   11 AM  Provider/Observer:  Ilean Skill, Psy.D.       Clinical Neuropsychologist       Billing Code/Service: 96158/96159  Chief Complaint:    Luke Rivera is an 79 year old male who has a history of CAD/CAF, DVT, TIA, type 2 diabetes with peripheral neuropathy, multiple back surgeries with limited mobility who was initially seen by our treatment team on the inpatient unit after being admitted on 05/31/2019.  The patient initially had garbled speech and right-sided weakness.  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  The patient has continued to have issues with right-sided motor deficits that are improving significantly and continued expressive language issues.  The patient has been followed by Dr. Kasandra Knudsen with [facility] as well as Dr. Read Drivers with physical medicine.  He has also been continued to see rehabilitation services for PT/OT/speech.  Reason for Service:  Luke Rivera is an 79 year old male who has a history of CAD/CAF, DVT, TIA, type 2 diabetes with peripheral neuropathy, multiple back surgeries with limited mobility who was initially seen by our treatment team on the inpatient unit after being admitted on 05/31/2019.  The patient initially had garbled speech and right-sided weakness.  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  The  patient has continued to have issues with right-sided motor deficits that are improving significantly and continued expressive language issues.  The patient has been followed by Dr. Kasandra Knudsen with [facility] as well as Dr. Elnita Maxwell with physical medicine.  He has also been continued to see rehabilitation services for PT/OT/speech.  The patient has continued with significant mobility issues, significant pain in his right shoulder which they are evaluating as to whether it is neurological pain in nature or related to some orthopedic issues, problems with his motor function in his hand post stroke as well as changes in expressive language function.  However, memory and other cognitive functioning appears to remain intact.  His stroke And on May 31, 2019.  There is also indications of previous TIA.  The patient had increased irritation and irritability during his hospital course and this persisted after discharge.  Dr. Letta Pate has started him on Celexa, and the patient's wife reports that he had a very positive response to this medication and has continued to take it.  However, the patient has had times of significant motivation and effort concerns that are vocalized by both the patient's wife as well as having an impact on some of his therapeutic efforts.  The patient is continued with rehabilitation services since his discharge in July of this year.  He has been doing rehab with the [facility] system at home and now he is working with [facility] rehab in [place] doing both OT, PT and speech therapy.  The patient reports that he sleeps from 11 PM at night to 9  or 10 in the morning and feels like his sleep is adequate.  The patient has a good appetite most days and describes his memory is within normal limits and at baseline.  Current Status:  The patient continues to describe residual motor deficits that are resulting from a combination of multiple back surgeries prior as well as his recent  cerebrovascular accident.  These motor deficits are having a significant impact on his mobility and reduction in functioning create increased frustration and motivation issues.  The patient does have some expressive language deficits but there have been significant improvements in the patient maintains her continues to have some fluency and word finding issues but not to the level that leave him unable to effectively communicate and express himself.  Depressive symptoms have improved with SSRI medications.  He is actively working on some of the therapeutic interventions we have discussed.  Reliability of Information: Information is derived from 1 hour face-to-face clinical interview as well as review of medical records.  Behavioral Observation: Luke Rivera  presents as a 79 y.o.-year-old Right Caucasian Male who appeared his stated age. his dress was Appropriate and he was Well Groomed and his manners were Appropriate to the situation.  his participation was indicative of Appropriate and Attentive behaviors.  There were any physical disabilities noted.  he displayed an appropriate level of cooperation and motivation.     Interactions:    Active Appropriate and Attentive  Attention:   within normal limits and attention span and concentration were age appropriate  Memory:   within normal limits; recent and remote memory intact  Visuo-spatial:  not examined  Speech (Volume):  low  Speech:   fluent aphasia; mild word finding and verbal fluency issues  Thought Process:  Coherent and Relevant  Though Content:  WNL; not suicidal and not homicidal  Orientation:   person, place, time/date and situation  Judgment:   Good  Planning:   Good  Affect:    Appropriate  Mood:    Dysphoric  Insight:   Fair  Intelligence:   high  Marital Status/Living: The patient was born and raised in [place] with 1 brother.  The patient lives with his wife and they have been married for 15 years.   The patient has been married previously with first marriage lasting 95 years until his wife passed away.  The patient has 3 daughters.  Current Employment: The patient is retired.  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  Hobbies and interests include golf and listening to music.  The patient was in [employer] between [place].  His highest rank and rank at discharge was E4 the patient had an honorable discharge.  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  Substance Use:  No concerns of substance abuse are reported.    Education:   HS Graduate  Medical History:   Past Medical History:  Diagnosis Date  . CAD (coronary artery disease)   . CHF (congestive heart failure) ([place])   . Chronic anticoagulation   . DM2 (diabetes mellitus, type 2) ([place])   . DVT (deep vein thrombosis) in pregnancy   . GERD (gastroesophageal reflux disease)   . HTN (hypertension)   . TIA (transient ischemic attack)      Psychiatric History:  The patient has no prior psychiatric history  Family Med/Psych History:  Family History  Problem Relation Age of Onset  . Hypertension Father   .  Heart disease Mother   . Heart attack Brother     Impression/DX:  Luke Rivera is an 79 year old male who has a history of CAD/CAF, DVT, TIA, type 2 diabetes with peripheral neuropathy, multiple back surgeries with limited mobility who was initially seen by our treatment team on the inpatient unit after being admitted on 05/31/2019.  The patient initially had garbled speech and right-sided weakness.  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  The patient has continued to have issues with right-sided motor deficits that are improving significantly and continued expressive language issues.  The patient has been followed by Dr. Kasandra Knudsen with  [facility] as well as Dr. Read Drivers with physical medicine.  He has also been continued to see rehabilitation services for PT/OT/speech.  The patient continues to have residual motor deficits particularly with his right hand.  He has a long history of multiple back surgeries that also have an impact on his mobility.  The patient continues to have some expressive language deficits but these have significantly improved and does continue to have some fluency and word finding issues but overall he is able to effectively communicate and express himself.  Depressive symptoms have improved with SSRI medication but continued to be an issue and having a negative impact on both therapeutic interventions as well as behavioral impacts at home.  The patient's wife reports that there are days where he simply does not feel well and will do little but sit in his recliner.  He has had some GI symptoms that he attributes his inactivity to at times.  Disposition/Plan:  We are continue to work on therapeutic interventions around depression and adjustment issues.  The patient significant motor changes and residual hemiparesis and improving but continued aphasia continue to be quite problematic and affecting his coping abilities.  The patient fully participated in today's visit and we have continue to work on therapeutic interventions utilizing cognitive/behavioral therapeutic interventions and working on adjustment and coping skills.  Diagnosis:    Hemiparesis affecting right side as late effect of cerebrovascular accident (CVA) ([place])  Aphasia, late effect of cerebrovascular disease  Adhesive capsulitis of right shoulder  Adjustment disorder with mixed anxiety and depressed mood         Electronically Signed   _______________________ Ilean Skill, Psy.D.

## 2020-04-11 ENCOUNTER — Ambulatory Visit: Payer: Medicare HMO | Admitting: Physical Medicine & Rehabilitation

## 2020-04-20 ENCOUNTER — Encounter: Payer: Self-pay | Admitting: Physical Medicine & Rehabilitation

## 2020-04-20 ENCOUNTER — Encounter: Payer: Medicare HMO | Attending: Physical Medicine & Rehabilitation | Admitting: Physical Medicine & Rehabilitation

## 2020-04-20 ENCOUNTER — Other Ambulatory Visit: Payer: Self-pay

## 2020-04-20 VITALS — BP 126/71 | HR 63 | Temp 97.3°F | Ht 75.0 in | Wt 234.0 lb

## 2020-04-20 DIAGNOSIS — M7501 Adhesive capsulitis of right shoulder: Secondary | ICD-10-CM

## 2020-04-20 DIAGNOSIS — I69920 Aphasia following unspecified cerebrovascular disease: Secondary | ICD-10-CM | POA: Diagnosis not present

## 2020-04-20 DIAGNOSIS — F4323 Adjustment disorder with mixed anxiety and depressed mood: Secondary | ICD-10-CM | POA: Insufficient documentation

## 2020-04-20 DIAGNOSIS — I69351 Hemiplegia and hemiparesis following cerebral infarction affecting right dominant side: Secondary | ICD-10-CM

## 2020-04-20 NOTE — Patient Instructions (Addendum)
Please call if shoulder pain increases, we can repeat Right shoulder injection with ultrasound    Please do home exercise program supplied by PT, OT 3-4 times a week

## 2020-04-20 NOTE — Progress Notes (Addendum)
Subjective:    Patient ID: Luke Rivera, male    DOB: [birth year], 79 y.o.   MRN: [id number] 79 year old male with history of CAD and chronic atrial fibrillation as well as type 2 diabetes with peripheral neuropathy who was admitted to [facility] on 05/31/2019 with right-sided weakness and speech problems.  His CT showed left MCA distribution infarct.  TCD negative for PFO.  Eliquis dose was increased to 5 mg twice daily.  HPI Last Ultrasound guided Right shoulder injection lasted 3-4 weeks  Pain with Right shoulder abd but not at rest   Mod I dressing and bathing   Amb with walker  Drags Right foot with ambulation  Patient continues to live with his wife does the driving and shopping, etc. Pain Inventory Average Pain 0 Pain Right Now 0 My pain is constant and no pain  In the last 24 hours, has pain interfered with the following? General activity 0 Relation with others 0 Enjoyment of life 0 What TIME of day is your pain at its worst? morning Sleep (in general) Good  Pain is worse with: some activites Pain improves with: rest and heat/ice Relief from Meds: 4  Mobility use a walker  Function retired  Neuro/Psych bowel control problems tremor  Prior Studies Any changes since last visit?  no  Physicians involved in your care Any changes since last visit?  no   Family History  Problem Relation Age of Onset  . Hypertension Father   . Heart disease Mother   . Heart attack Brother    Social History   Socioeconomic History  . Marital status: Married    Spouse name: Not on file  . Number of children: Not on file  . Years of education: Not on file  . Highest education level: Not on file  Occupational History  . Not on file  Tobacco Use  . Smoking status: Former Research scientist (life sciences)  . Smokeless tobacco: Never Used  Substance and Sexual Activity  . Alcohol use: Never    Alcohol/week: 0.0 standard drinks  . Drug use: Never  . Sexual activity: Not on file  Other Topics  Concern  . Not on file  Social History Narrative  . Not on file   Social Determinants of Health   Financial Resource Strain:   . Difficulty of Paying Living Expenses:   Food Insecurity:   . Worried About Charity fundraiser in the Last Year:   . Arboriculturist in the Last Year:   Transportation Needs:   . Film/video editor (Medical):   Marland Kitchen Lack of Transportation (Non-Medical):   Physical Activity:   . Days of Exercise per Week:   . Minutes of Exercise per Session:   Stress:   . Feeling of Stress :   Social Connections:   . Frequency of Communication with Friends and Family:   . Frequency of Social Gatherings with Friends and Family:   . Attends Religious Services:   . Active Member of Clubs or Organizations:   . Attends Archivist Meetings:   Marland Kitchen Marital Status:    No past surgical history on file. Past Medical History:  Diagnosis Date  . CAD (coronary artery disease)   . CHF (congestive heart failure) ([place])   . Chronic anticoagulation   . DM2 (diabetes mellitus, type 2) ([place])   . DVT (deep vein thrombosis) in pregnancy   . GERD (gastroesophageal reflux disease)   . HTN (hypertension)   . TIA (transient ischemic attack)  BP 126/71   Pulse 63   Temp (!) 97.3 F (36.3 C)   Ht 6\' 3"  (1.905 m)   Wt 234 lb (106.1 kg)   SpO2 97%   BMI 29.25 kg/m   Opioid Risk Score:   Fall Risk Score:  `1  Depression screen PHQ 2/9  Depression screen [facility] 2/9 04/20/2020 10/07/2019 08/16/2019  Decreased Interest 0 0 0  Down, Depressed, Hopeless 0 0 0  PHQ - 2 Score 0 0 0    Review of Systems  Constitutional: Negative.   HENT: Negative.   Eyes: Negative.   Respiratory: Negative.   Cardiovascular: Negative.   Gastrointestinal:       Bowel control  Endocrine: Negative.   Genitourinary: Positive for urgency.  Musculoskeletal: Positive for arthralgias.  Skin: Negative.   Allergic/Immunologic: Negative.   Neurological: Negative.   Hematological: Bruises/bleeds  easily.       Plavix and eliquis  Psychiatric/Behavioral: Negative.   All other systems reviewed and are negative.      Objective:   Physical Exam Vitals and nursing note reviewed.  Constitutional:      Appearance: He is obese.  Eyes:     Extraocular Movements: Extraocular movements intact.     Conjunctiva/sclera: Conjunctivae normal.     Pupils: Pupils are equal, round, and reactive to light.  Musculoskeletal:     Comments: Right shoulder has some pain when adductor pain also pain with abduction, mildly positive drop arm test No evidence of subluxation right shoulder.  No pain with bicep flexion.   Skin:    General: Skin is warm and dry.  Neurological:     General: No focal deficit present.     Mental Status: He is alert and oriented to person, place, and time.     Comments: Patient with expressive aphasia or word finding issues Speech marking started. His motor strength is 4/5 in the right deltoid and tricep grip 5/5 in the right hip flexor knee extensor, 4 - ankle dorsiflexion  Psychiatric:        Mood and Affect: Mood normal.        Behavior: Behavior normal.           Assessment & Plan:   1.  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  He is almost 1 year of stroke.  We discussed that at this point I expect plateau in his neurologic coverage.  2.  Postop shoulder pain multifactorial likely combination of rotator cuff syndrome as well as adhesive capsulitis.  Overall he is pleased with this and does not complain of pain unless asked to fully abduct the arm F/u Cardiology Eliquis for atrial fibrillation Follow-up PCP F[facility]u [facility] Advised neurology follow-up the patient did not wish to pursue this Physical medicine rehab follow-up basis controlled to schedule repeat injection requires with patient Advised continuation of home exercise program

## 2020-06-27 ENCOUNTER — Inpatient Hospital Stay (HOSPITAL_COMMUNITY)
Admission: EM | Admit: 2020-06-27 | Discharge: 2020-06-29 | DRG: 312 | Disposition: A | Discharge status: Home / Self Care | Payer: Medicare HMO | Attending: Family Medicine | Admitting: Family Medicine

## 2020-06-27 ENCOUNTER — Encounter (HOSPITAL_COMMUNITY): Payer: Self-pay

## 2020-06-27 DIAGNOSIS — R55 Syncope and collapse: Secondary | ICD-10-CM | POA: Diagnosis not present

## 2020-06-27 DIAGNOSIS — E860 Dehydration: Secondary | ICD-10-CM | POA: Diagnosis present

## 2020-06-27 DIAGNOSIS — E663 Overweight: Secondary | ICD-10-CM | POA: Diagnosis present

## 2020-06-27 DIAGNOSIS — I130 Hypertensive heart and chronic kidney disease with heart failure and stage 1 through stage 4 chronic kidney disease, or unspecified chronic kidney disease: Secondary | ICD-10-CM | POA: Diagnosis present

## 2020-06-27 DIAGNOSIS — Z79899 Other long term (current) drug therapy: Secondary | ICD-10-CM

## 2020-06-27 DIAGNOSIS — I69320 Aphasia following cerebral infarction: Secondary | ICD-10-CM

## 2020-06-27 DIAGNOSIS — Z87891 Personal history of nicotine dependence: Secondary | ICD-10-CM

## 2020-06-27 DIAGNOSIS — I951 Orthostatic hypotension: Secondary | ICD-10-CM | POA: Diagnosis not present

## 2020-06-27 DIAGNOSIS — E1122 Type 2 diabetes mellitus with diabetic chronic kidney disease: Secondary | ICD-10-CM | POA: Diagnosis present

## 2020-06-27 DIAGNOSIS — E7800 Pure hypercholesterolemia, unspecified: Secondary | ICD-10-CM | POA: Diagnosis present

## 2020-06-27 DIAGNOSIS — Z8249 Family history of ischemic heart disease and other diseases of the circulatory system: Secondary | ICD-10-CM

## 2020-06-27 DIAGNOSIS — K219 Gastro-esophageal reflux disease without esophagitis: Secondary | ICD-10-CM | POA: Diagnosis present

## 2020-06-27 DIAGNOSIS — Z794 Long term (current) use of insulin: Secondary | ICD-10-CM

## 2020-06-27 DIAGNOSIS — N182 Chronic kidney disease, stage 2 (mild): Secondary | ICD-10-CM | POA: Diagnosis present

## 2020-06-27 DIAGNOSIS — Z6829 Body mass index (BMI) 29.0-29.9, adult: Secondary | ICD-10-CM

## 2020-06-27 DIAGNOSIS — Z955 Presence of coronary angioplasty implant and graft: Secondary | ICD-10-CM

## 2020-06-27 DIAGNOSIS — Z7902 Long term (current) use of antithrombotics/antiplatelets: Secondary | ICD-10-CM

## 2020-06-27 DIAGNOSIS — Z7901 Long term (current) use of anticoagulants: Secondary | ICD-10-CM

## 2020-06-27 DIAGNOSIS — R197 Diarrhea, unspecified: Secondary | ICD-10-CM | POA: Diagnosis present

## 2020-06-27 DIAGNOSIS — I69351 Hemiplegia and hemiparesis following cerebral infarction affecting right dominant side: Secondary | ICD-10-CM

## 2020-06-27 DIAGNOSIS — Z20822 Contact with and (suspected) exposure to covid-19: Secondary | ICD-10-CM | POA: Diagnosis present

## 2020-06-27 DIAGNOSIS — E1142 Type 2 diabetes mellitus with diabetic polyneuropathy: Secondary | ICD-10-CM | POA: Diagnosis present

## 2020-06-27 DIAGNOSIS — I2510 Atherosclerotic heart disease of native coronary artery without angina pectoris: Secondary | ICD-10-CM | POA: Diagnosis present

## 2020-06-27 DIAGNOSIS — R112 Nausea with vomiting, unspecified: Secondary | ICD-10-CM

## 2020-06-27 DIAGNOSIS — Z86718 Personal history of other venous thrombosis and embolism: Secondary | ICD-10-CM

## 2020-06-27 DIAGNOSIS — I5032 Chronic diastolic (congestive) heart failure: Secondary | ICD-10-CM | POA: Diagnosis present

## 2020-06-27 HISTORY — DX: Cerebral infarction, unspecified: I63.9

## 2020-06-27 HISTORY — DX: Acute embolism and thrombosis of unspecified deep veins of unspecified lower extremity: I82.409

## 2020-06-27 LAB — CBC
HCT: 45.6 % (ref 39.0–52.0)
Hemoglobin: 14.5 g/dL (ref 13.0–17.0)
MCH: 26.8 pg (ref 26.0–34.0)
MCHC: 31.8 g/dL (ref 30.0–36.0)
MCV: 84.1 fL (ref 80.0–100.0)
Platelets: 558 10*3/uL — ABNORMAL HIGH (ref 150–400)
RBC: 5.42 MIL/uL (ref 4.22–5.81)
RDW: 16.5 % — ABNORMAL HIGH (ref 11.5–15.5)
WBC: 13.4 10*3/uL — ABNORMAL HIGH (ref 4.0–10.5)
nRBC: 0 % (ref 0.0–0.2)

## 2020-06-27 LAB — BASIC METABOLIC PANEL
Anion gap: 13 (ref 5–15)
BUN: 16 mg/dL (ref 8–23)
CO2: 21 mmol/L — ABNORMAL LOW (ref 22–32)
Calcium: 9 mg/dL (ref 8.9–10.3)
Chloride: 106 mmol/L (ref 98–111)
Creatinine, Ser: 1.13 mg/dL (ref 0.61–1.24)
GFR calc Af Amer: >60 mL/min (ref >60)
GFR calc non Af Amer: >60 mL/min (ref >60)
Glucose, Bld: 207 mg/dL — ABNORMAL HIGH (ref 70–99)
Potassium: 3.8 mmol/L (ref 3.5–5.1)
Sodium: 140 mmol/L (ref 135–145)

## 2020-06-27 LAB — CBG MONITORING, ED: Glucose-Capillary: 180 mg/dL — ABNORMAL HIGH (ref 70–99)

## 2020-06-27 MED ORDER — SODIUM CHLORIDE 0.9% FLUSH
3.0000 mL | Freq: Once | INTRAVENOUS | Status: AC
  Administered 2020-06-28: 3 mL via INTRAVENOUS

## 2020-06-27 NOTE — ED Notes (Signed)
Triage RN notified of low bp.

## 2020-06-27 NOTE — ED Triage Notes (Signed)
Pt arrives to ED w/ c/o n/v and syncope. Per EMS pt passed out on the toilet, pt did not fall. Pt AOx4, neuro intact. Pt denies chest pain, sob, headache, dizziness, abdominal pain.

## 2020-06-28 ENCOUNTER — Emergency Department (HOSPITAL_COMMUNITY): Payer: Medicare HMO

## 2020-06-28 ENCOUNTER — Encounter (HOSPITAL_COMMUNITY): Payer: Self-pay | Admitting: Internal Medicine

## 2020-06-28 ENCOUNTER — Observation Stay (HOSPITAL_BASED_OUTPATIENT_CLINIC_OR_DEPARTMENT_OTHER): Payer: Medicare HMO

## 2020-06-28 ENCOUNTER — Other Ambulatory Visit: Payer: Self-pay

## 2020-06-28 DIAGNOSIS — R55 Syncope and collapse: Secondary | ICD-10-CM

## 2020-06-28 DIAGNOSIS — Z7901 Long term (current) use of anticoagulants: Secondary | ICD-10-CM

## 2020-06-28 DIAGNOSIS — N182 Chronic kidney disease, stage 2 (mild): Secondary | ICD-10-CM | POA: Diagnosis present

## 2020-06-28 DIAGNOSIS — E669 Obesity, unspecified: Secondary | ICD-10-CM

## 2020-06-28 DIAGNOSIS — R112 Nausea with vomiting, unspecified: Secondary | ICD-10-CM

## 2020-06-28 DIAGNOSIS — E1022 Type 1 diabetes mellitus with diabetic chronic kidney disease: Secondary | ICD-10-CM | POA: Diagnosis present

## 2020-06-28 DIAGNOSIS — E1169 Type 2 diabetes mellitus with other specified complication: Secondary | ICD-10-CM

## 2020-06-28 DIAGNOSIS — E860 Dehydration: Secondary | ICD-10-CM | POA: Diagnosis not present

## 2020-06-28 DIAGNOSIS — I69351 Hemiplegia and hemiparesis following cerebral infarction affecting right dominant side: Secondary | ICD-10-CM

## 2020-06-28 DIAGNOSIS — R197 Diarrhea, unspecified: Secondary | ICD-10-CM

## 2020-06-28 DIAGNOSIS — I5032 Chronic diastolic (congestive) heart failure: Secondary | ICD-10-CM | POA: Diagnosis present

## 2020-06-28 LAB — CBG MONITORING, ED
Glucose-Capillary: 177 mg/dL — ABNORMAL HIGH (ref 70–99)
Glucose-Capillary: 133 mg/dL — ABNORMAL HIGH (ref 70–99)
Glucose-Capillary: 124 mg/dL — ABNORMAL HIGH (ref 70–99)

## 2020-06-28 LAB — URINALYSIS, ROUTINE W REFLEX MICROSCOPIC
Bilirubin Urine: NEGATIVE
Glucose, UA: NEGATIVE mg/dL
Hgb urine dipstick: NEGATIVE
Ketones, ur: NEGATIVE mg/dL
Leukocytes,Ua: NEGATIVE
Nitrite: NEGATIVE
Protein, ur: NEGATIVE mg/dL
Specific Gravity, Urine: >1.046 — ABNORMAL HIGH (ref 1.005–1.030)
pH: 5 (ref 5.0–8.0)

## 2020-06-28 LAB — LIPASE, BLOOD: Lipase: 42 U/L (ref 11–51)

## 2020-06-28 LAB — HEPATIC FUNCTION PANEL
ALT: 21 U/L (ref 0–44)
AST: 20 U/L (ref 15–41)
Albumin: 4.1 g/dL (ref 3.5–5.0)
Alkaline Phosphatase: 81 U/L (ref 38–126)
Bilirubin, Direct: 0.2 mg/dL (ref 0.0–0.2)
Indirect Bilirubin: 1.4 mg/dL — ABNORMAL HIGH (ref 0.3–0.9)
Total Bilirubin: 1.6 mg/dL — ABNORMAL HIGH (ref 0.3–1.2)
Total Protein: 6.9 g/dL (ref 6.5–8.1)

## 2020-06-28 LAB — BASIC METABOLIC PANEL
Anion gap: 11 (ref 5–15)
BUN: 19 mg/dL (ref 8–23)
CO2: 24 mmol/L (ref 22–32)
Calcium: 8.7 mg/dL — ABNORMAL LOW (ref 8.9–10.3)
Chloride: 104 mmol/L (ref 98–111)
Creatinine, Ser: 1.18 mg/dL (ref 0.61–1.24)
GFR calc Af Amer: >60 mL/min (ref >60)
GFR calc non Af Amer: 58 mL/min — ABNORMAL LOW (ref >60)
Glucose, Bld: 193 mg/dL — ABNORMAL HIGH (ref 70–99)
Potassium: 4.5 mmol/L (ref 3.5–5.1)
Sodium: 139 mmol/L (ref 135–145)

## 2020-06-28 LAB — GLUCOSE, CAPILLARY
Glucose-Capillary: 125 mg/dL — ABNORMAL HIGH (ref 70–99)
Glucose-Capillary: 158 mg/dL — ABNORMAL HIGH (ref 70–99)
Glucose-Capillary: 160 mg/dL — ABNORMAL HIGH (ref 70–99)

## 2020-06-28 LAB — ECHOCARDIOGRAM COMPLETE
Area-P 1/2: 3.48 cm2
Height: 75 in
S' Lateral: 3.7 cm
Weight: 3808 oz

## 2020-06-28 LAB — MAGNESIUM: Magnesium: 1.4 mg/dL — ABNORMAL LOW (ref 1.7–2.4)

## 2020-06-28 LAB — LACTIC ACID, PLASMA: Lactic Acid, Venous: 1.4 mmol/L (ref 0.5–1.9)

## 2020-06-28 LAB — SARS CORONAVIRUS 2 BY RT PCR (HOSPITAL ORDER, PERFORMED IN ~~LOC~~ HOSPITAL LAB): SARS Coronavirus 2: NEGATIVE

## 2020-06-28 LAB — HEMOGLOBIN A1C
Hgb A1c MFr Bld: 6.7 % — ABNORMAL HIGH (ref 4.8–5.6)
Mean Plasma Glucose: 145.59 mg/dL

## 2020-06-28 MED ORDER — APIXABAN 5 MG PO TABS
5.0000 mg | ORAL_TABLET | Freq: Two times a day (BID) | ORAL | Status: DC
  Administered 2020-06-28 – 2020-06-29 (×3): 5 mg via ORAL
  Filled 2020-06-28 (×5): qty 1

## 2020-06-28 MED ORDER — MAGNESIUM SULFATE 2 GM/50ML IV SOLN
2.0000 g | Freq: Once | INTRAVENOUS | Status: AC
  Administered 2020-06-28: 2 g via INTRAVENOUS
  Filled 2020-06-28: qty 50

## 2020-06-28 MED ORDER — PANTOPRAZOLE SODIUM 40 MG PO TBEC
40.0000 mg | DELAYED_RELEASE_TABLET | Freq: Every day | ORAL | Status: DC
  Administered 2020-06-28 – 2020-06-29 (×2): 40 mg via ORAL
  Filled 2020-06-28 (×2): qty 1

## 2020-06-28 MED ORDER — CITALOPRAM HYDROBROMIDE 10 MG PO TABS
5.0000 mg | ORAL_TABLET | Freq: Every day | ORAL | Status: DC
  Administered 2020-06-28 – 2020-06-29 (×2): 5 mg via ORAL
  Filled 2020-06-28 (×2): qty 1

## 2020-06-28 MED ORDER — SODIUM CHLORIDE 0.9% FLUSH
3.0000 mL | Freq: Two times a day (BID) | INTRAVENOUS | Status: DC
  Administered 2020-06-28 – 2020-06-29 (×2): 3 mL via INTRAVENOUS

## 2020-06-28 MED ORDER — SODIUM CHLORIDE 0.9 % IV BOLUS
500.0000 mL | Freq: Once | INTRAVENOUS | Status: AC
  Administered 2020-06-28: 500 mL via INTRAVENOUS

## 2020-06-28 MED ORDER — IOHEXOL 300 MG/ML  SOLN
125.0000 mL | Freq: Once | INTRAMUSCULAR | Status: AC | PRN
  Administered 2020-06-28: 125 mL via INTRAVENOUS

## 2020-06-28 MED ORDER — DICLOFENAC SODIUM 1 % TD GEL: 2.0000 g | Freq: Four times a day (QID) | TRANSDERMAL | Status: DC | PRN

## 2020-06-28 MED ORDER — SODIUM CHLORIDE 0.9 % IV BOLUS
1000.0000 mL | Freq: Once | INTRAVENOUS | Status: AC
  Administered 2020-06-28: 1000 mL via INTRAVENOUS

## 2020-06-28 MED ORDER — INSULIN ASPART 100 UNIT/ML ~~LOC~~ SOLN
0.0000 [IU] | SUBCUTANEOUS | Status: DC
  Administered 2020-06-28 (×3): 1 [IU] via SUBCUTANEOUS
  Administered 2020-06-28: 2 [IU] via SUBCUTANEOUS
  Administered 2020-06-29: 1 [IU] via SUBCUTANEOUS
  Administered 2020-06-29: 2 [IU] via SUBCUTANEOUS
  Administered 2020-06-29: 1 [IU] via SUBCUTANEOUS

## 2020-06-28 MED ORDER — ATORVASTATIN CALCIUM 40 MG PO TABS
40.0000 mg | ORAL_TABLET | Freq: Every day | ORAL | Status: DC
  Administered 2020-06-28: 40 mg via ORAL
  Filled 2020-06-28: qty 1

## 2020-06-28 MED ORDER — SODIUM CHLORIDE 0.9 % IV SOLN: INTRAVENOUS | Status: DC

## 2020-06-28 MED ORDER — ONDANSETRON HCL 4 MG/2ML IJ SOLN
4.0000 mg | Freq: Once | INTRAMUSCULAR | Status: AC
  Administered 2020-06-28: 4 mg via INTRAVENOUS
  Filled 2020-06-28: qty 2

## 2020-06-28 MED ORDER — FAMOTIDINE IN NACL 20-0.9 MG/50ML-% IV SOLN
20.0000 mg | Freq: Once | INTRAVENOUS | Status: AC
  Administered 2020-06-28: 20 mg via INTRAVENOUS
  Filled 2020-06-28: qty 50

## 2020-06-28 MED ORDER — CLOPIDOGREL BISULFATE 75 MG PO TABS
75.0000 mg | ORAL_TABLET | Freq: Every day | ORAL | Status: DC
  Administered 2020-06-28 – 2020-06-29 (×2): 75 mg via ORAL
  Filled 2020-06-28 (×2): qty 1

## 2020-06-28 NOTE — ED Notes (Signed)
Pt provided ice water for PO challenge.  

## 2020-06-28 NOTE — Progress Notes (Signed)
°  Echocardiogram 2D Echocardiogram has been performed.  Darlina Sicilian M 06/28/2020, 8:22 AM

## 2020-06-28 NOTE — ED Notes (Signed)
Pt transported to CT ?

## 2020-06-28 NOTE — ED Notes (Signed)
Patient's wife wanted to leave her number to be contacted once patient gets a bed upstairs ( [phone])

## 2020-06-28 NOTE — ED Notes (Signed)
Patient's Significant other out to desk stating patient wanted to Marcial Pacas RN made RN assuming care of patient aware so that admitting MD could be paged to come and speak with patient. Upon this RN entering the room to transport patient over to yellow for holding, pt stated he did not want to wait for admitting and was ready to go. This Rn encouraged patient to stay and speak with admitting MD, however, patient refused. AMA acknowledgement signed by patient and witnessed by this RN. Pt ambulatory upon discharge.

## 2020-06-28 NOTE — Progress Notes (Signed)
Luke Rivera is a 79 y.o. male patient admitted from ED awake, alert - oriented  X 4 - no acute distress noted.  VSS - Blood pressure (!) 117/52, pulse (!) 58, temperature 98.8 F (37.1 C), temperature source Oral, resp. rate 10, height 6\' 3"  (1.905 m), weight 108 kg, SpO2 97 %.    IV in place, occlusive dsg intact without redness.  Orientation to room, and floor completed with information packet given to patient.  Skin, clean-dry- intact, bruising on bilateral arms and a stage 1 wound on the right ankle. Patient is in bed, call bell, phone and bedside table within reach. Wife is also at the bedside.    [name], RN 06/28/2020 7:34 PM

## 2020-06-28 NOTE — ED Provider Notes (Signed)
[facility] EMERGENCY DEPARTMENT Provider Note   CSN: [id number] Arrival date & time: 06/27/20  2219     History Chief Complaint  Patient presents with  . Loss of Consciousness    Luke Rivera is a 79 y.o. male with a history of CVA with right hemiparesis, DVT on Eliquis, CAD on Plavix s/p DES OM1 and RCA in 2013, CHF, diabetes mellitus type 2, hypercholesteremia who presents to the emergency department by EMS with syncope.  The patient reports sudden onset nausea accompanied by multiples episodes of both NBNB vomiting, and nonbloody diarrhea that began simultaneously at 1900.  The patient's wife reports that the patient was on the toilet when his eyes suddenly started to roll back in his head and he made a "funny sound and had heavy breathing" for 2 to 3 seconds when he became unresponsive and started to lean forward as if he was going to fall off the toilet.  She was able to catch him.  He did not fall and he did not hit his head.  The patient cannot recall the incident, and believes that he had a syncopal episode.  No shaking, jerking, tongue biting, or urinary or fecal incontinence.  He was well enough earlier in the day to accompany his wife to the [employer].  His appetite was good earlier in the day, but he developed anorexia around dinnertime.  Reports that prior to onset of vomiting and diarrhea that his skin looked pale.    His only complaint at the moment is a mild headache.  He has numbness, weakness, and some mild swelling to his right abdomen since his previous stroke, which is baseline.  No fever, chills, chest pain, shortness of breath, cough, abdominal pain, melena, hematochezia, hematemesis, dysuria, hematuria, rash, back pain, dizziness, or lightheadedness.  No visual changes.  No suspicious food intake.  No recent travel.  No recent medication changes.  His wife reports that she had a short-lived episode of URI symptoms with vomiting and diarrhea  about a week ago for about a day.  No other known sick contacts. He reports that his blood sugars have been well controlled at home.  Surgical history includes appendectomy and cholecystectomy.  He is fully vaccinated against COVID-19.  The history is provided by the patient. No language interpreter was used.       Past Medical History:  Diagnosis Date  . CAD (coronary artery disease)   . CHF (congestive heart failure) ([place])   . Chronic anticoagulation   . DM2 (diabetes mellitus, type 2) ([place])   . DVT (deep vein thrombosis) in pregnancy   . GERD (gastroesophageal reflux disease)   . HTN (hypertension)   . TIA (transient ischemic attack)     Patient Active Problem List   Diagnosis Date Noted  . Dysphagia, post-stroke   . Diabetes mellitus type 2 in obese ([place])   . Acute blood loss anemia   . Hypokalemia   . Diabetic peripheral neuropathy ([place])   . Right knee pain   . Acute ischemic right MCA stroke ([place]) 06/07/2019  . AMS (altered mental status)   . Incidental pulmonary nodule, > 39mm and < 29mm 06/01/2019  . CVA (cerebral vascular accident) ([place]) 05/31/2019  . 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 Onychomycosis 01/11/2015  . Pain in lower limb 01/11/2015  . Hammer toe of right foot 01/02/2015  . Pain in right foot 01/02/2015    History reviewed. No pertinent surgical  history.     Family History  Problem Relation Age of Onset  . Hypertension Father   . Heart disease Mother   . Heart attack Brother     Social History   Tobacco Use  . Smoking status: Former Research scientist (life sciences)  . Smokeless tobacco: Never Used  Vaping Use  . Vaping Use: Never used  Substance Use Topics  . Alcohol use: Never    Alcohol/week: 0.0 standard drinks  . Drug use: Never    Home Medications Prior to Admission medications   Medication Sig Start Date End Date Taking? Authorizing Provider  acetaminophen (TYLENOL) 325 MG tablet Take 1-2 tablets (325-650 mg total) by mouth every 4  (four) hours as needed for mild pain. 06/15/19  Yes Love, Ivan Anchors, PA-C  apixaban (ELIQUIS) 5 MG TABS tablet Take 1 tablet (5 mg total) by mouth 2 (two) times daily. 06/07/19  Yes Meccariello, Bernita Raisin, DO  atorvastatin (LIPITOR) 80 MG tablet Take 40 mg by mouth at bedtime.    Yes [provider]  Cholecalciferol (VITAMIN D3) 50 MCG (2000 UT) TABS Take 4,000 Units by mouth daily.   Yes [provider]  citalopram (CELEXA) 10 MG tablet Take 1 tablet (10 mg total) by mouth daily. Patient taking differently: Take 5 mg by mouth daily.  07/08/19  Yes Kirsteins, Luanna Salk, MD  clopidogrel (PLAVIX) 75 MG tablet Take 1 tablet (75 mg total) by mouth daily. 06/29/19  Yes Love, Ivan Anchors, PA-C  CYANOCOBALAMIN PO Take 5,000 Units by mouth every Sunday.   Yes [provider]  diclofenac sodium (VOLTAREN) 1 % GEL Apply 2 g topically 4 (four) times daily. Patient taking differently: Apply 2 g topically 4 (four) times daily as needed (Shoulder pain).  06/29/19  Yes Love, Ivan Anchors, PA-C  insulin glargine (LANTUS) 100 UNIT/ML injection Inject 0.22 mLs (22 Units total) into the skin at bedtime. Patient taking differently: Inject 10 Units into the skin at bedtime.  07/02/19  Yes Love, Ivan Anchors, PA-C  metFORMIN (GLUCOPHAGE) 500 MG tablet Take 500 mg by mouth daily with breakfast.   Yes [provider]  pantoprazole (PROTONIX) 40 MG tablet Take 40 mg by mouth daily.   Yes [provider]  potassium chloride (KLOR-CON) 10 MEQ tablet Take 10 mEq by mouth daily.   Yes [provider]  tiZANidine (ZANAFLEX) 4 MG tablet TAKE 1 TABLET (4 MG TOTAL) BY MOUTH AT BEDTIME. 02/25/20  Yes Kirsteins, Luanna Salk, MD  loperamide (IMODIUM A-D) 2 MG tablet Take 1 tablet (2 mg total) by mouth 4 (four) times daily as needed for diarrhea or loose stools. Patient not taking: Reported on 04/20/2020 06/24/19   Love, Ivan Anchors, PA-C  potassium chloride SA (K-DUR) 20 MEQ tablet Take 1 tablet (20 mEq total)  by mouth daily. Patient not taking: Reported on 06/28/2020 08/30/19   Charlett Blake, MD    Allergies    Patient has no known allergies.  Review of Systems   Review of Systems  Constitutional: Negative for appetite change, chills and fever.  HENT: Negative for congestion and sore throat.   Eyes: Negative for visual disturbance.  Respiratory: Negative for cough and shortness of breath.   Cardiovascular: Negative for chest pain and leg swelling.  Gastrointestinal: Positive for diarrhea, nausea and vomiting. Negative for abdominal distention, abdominal pain, anal bleeding and blood in stool.  Genitourinary: Negative for dysuria, frequency, hematuria and urgency.  Musculoskeletal: Negative for arthralgias, back pain, gait problem, myalgias, neck pain and  neck stiffness.  Skin: Negative for rash and wound.  Allergic/Immunologic: Negative for immunocompromised state.  Neurological: Positive for syncope and headaches. Negative for dizziness, seizures, weakness and numbness.  Psychiatric/Behavioral: Negative for confusion.    Physical Exam Updated Vital Signs BP 135/74 (BP Location: Right Arm)   Pulse 96   Temp 99.3 F (37.4 C) (Rectal)   Resp 18   Ht 6\' 3"  (1.905 m)   Wt 108 kg   SpO2 98%   BMI 29.75 kg/m   Physical Exam Vitals and nursing note reviewed.  Constitutional:      General: He is not in acute distress.    Appearance: He is well-developed. He is ill-appearing. He is not toxic-appearing or diaphoretic.  HENT:     Head: Normocephalic and atraumatic.     Mouth/Throat:     Mouth: Mucous membranes are dry.  Eyes:     Extraocular Movements: Extraocular movements intact.     Conjunctiva/sclera: Conjunctivae normal.     Pupils: Pupils are equal, round, and reactive to light.  Cardiovascular:     Rate and Rhythm: Normal rate and regular rhythm.     Pulses: Normal pulses.     Heart sounds: Normal heart sounds. No murmur heard.  No friction rub. No gallop.    Pulmonary:     Effort: Pulmonary effort is normal. No respiratory distress.     Breath sounds: No stridor. No wheezing, rhonchi or rales.  Chest:     Chest wall: No tenderness.  Abdominal:     General: There is no distension.     Palpations: Abdomen is soft. There is no mass.     Tenderness: There is no right CVA tenderness, left CVA tenderness, guarding or rebound.     Hernia: No hernia is present.     Comments: Mild right upper quadrant abdominal wall swelling when compared to the left.  Patient's wife reports that this is at baseline and is chronic since his previous stroke.  Mild tenderness to palpation throughout the abdomen.  No rebound or guarding.  Musculoskeletal:        General: No tenderness.     Cervical back: Normal range of motion and neck supple.     Right lower leg: No edema.     Left lower leg: No edema.  Skin:    General: Skin is warm and dry.     Capillary Refill: Capillary refill takes 2 to 3 seconds.     Coloration: Skin is pale.     Comments: Pallorous  Neurological:     Mental Status: He is alert.     Comments: Alert and oriented x4.  GCS 15.  Moves all 4 extremities spontaneously.  Cranial nerves II through XII are grossly intact.  Psychiatric:        Behavior: Behavior normal.     ED Results / Procedures / Treatments   Labs (all labs ordered are listed, but only abnormal results are displayed) Labs Reviewed  BASIC METABOLIC PANEL - Abnormal; Notable for the following components:      Result Value   CO2 21 (*)    Glucose, Bld 207 (*)    All other components within normal limits  CBC - Abnormal; Notable for the following components:   WBC 13.4 (*)    RDW 16.5 (*)    Platelets 558 (*)    All other components within normal limits  URINALYSIS, ROUTINE W REFLEX MICROSCOPIC - Abnormal; Notable for the following components:   Specific Gravity,  Urine >1.046 (*)    All other components within normal limits  HEPATIC FUNCTION PANEL - Abnormal; Notable for  the following components:   Total Bilirubin 1.6 (*)    Indirect Bilirubin 1.4 (*)    All other components within normal limits  MAGNESIUM - Abnormal; Notable for the following components:   Magnesium 1.4 (*)    All other components within normal limits  CBG MONITORING, ED - Abnormal; Notable for the following components:   Glucose-Capillary 180 (*)    All other components within normal limits  SARS CORONAVIRUS 2 BY RT PCR (HOSPITAL ORDER, [facility])  LIPASE, BLOOD  LACTIC ACID, PLASMA    EKG EKG Interpretation  Date/Time:  Tuesday June 27 2020 22:23:28 EDT Ventricular Rate:  71 PR Interval:  272 QRS Duration: 120 QT Interval:  448 QTC Calculation: 486 R Axis:   -38 Text Interpretation: Sinus rhythm with sinus arrhythmia with 1st degree A-V block Left axis deviation Non-specific intra-ventricular conduction delay Abnormal ECG No significant change was found Confirmed by Ezequiel Essex [phone]) on 06/27/2020 11:38:28 PM   Radiology DG Chest 2 View  Result Date: 06/28/2020 CLINICAL DATA:  Nausea and vomiting, syncope EXAM: CHEST - 2 VIEW COMPARISON:  06/01/2019 FINDINGS: The heart size and mediastinal contours are within normal limits. Both lungs are clear. The visualized skeletal structures are unremarkable. IMPRESSION: No active cardiopulmonary disease. Electronically Signed   By: Randa Ngo M.D.   On: 06/28/2020 01:05   CT Head Wo Contrast  Result Date: 06/28/2020 CLINICAL DATA:  Syncope EXAM: CT HEAD WITHOUT CONTRAST TECHNIQUE: Contiguous axial images were obtained from the base of the skull through the vertex without intravenous contrast. COMPARISON:  June 01, 2019 MRI FINDINGS: Brain: No evidence of acute territorial infarction, hemorrhage, hydrocephalus,extra-axial collection or mass lesion/mass effect. There is dilatation the ventricles and sulci consistent with age-related atrophy. Low-attenuation changes in the deep white matter consistent  with small vessel ischemia. Area of encephalomalacia involving the superior left frontoparietal lobe. Vascular: Calcifications are seen within the internal carotid artery. Skull: The skull is intact. No fracture or focal lesion identified. Sinuses/Orbits: The visualized paranasal sinuses and mastoid air cells are clear. The orbits and globes intact. Other: None IMPRESSION: No acute intracranial abnormality. Findings consistent with age related atrophy and chronic small vessel ischemia Area of encephalomalacia involving the left frontoparietal lobe. Electronically Signed   By: Prudencio Pair M.D.   On: 06/28/2020 00:58   CT ABDOMEN PELVIS W CONTRAST  Result Date: 06/28/2020 CLINICAL DATA:  Nausea and vomiting, diabetes EXAM: CT ABDOMEN AND PELVIS WITH CONTRAST TECHNIQUE: Multidetector CT imaging of the abdomen and pelvis was performed using the standard protocol following bolus administration of intravenous contrast. CONTRAST:  193mL OMNIPAQUE IOHEXOL 300 MG/ML  SOLN COMPARISON:  06/01/2019 FINDINGS: Lower chest: No acute pleural or parenchymal lung disease. Hepatobiliary: No focal liver abnormality is seen. Status post cholecystectomy. No biliary dilatation. Pancreas: Unremarkable. No pancreatic ductal dilatation or surrounding inflammatory changes. Spleen: Normal in size without focal abnormality. Adrenals/Urinary Tract: The left kidney is markedly atrophic, with multiple coarse calcifications measuring up to 1.3 cm in size. There is a lobular 6.3 cm right renal cyst identified. No right-sided calculi or obstruction. Bladder is minimally distended with no focal abnormality. No adrenal masses. Stomach/Bowel: No bowel obstruction or ileus. No bowel wall thickening or inflammatory change. The appendix is not identified. Vascular/Lymphatic: There is moderate atherosclerosis throughout the aorta. There is a linear radiopaque device within the distal  IVC and bilateral common iliac veins. Numerous venous collaterals are  seen within the retroperitoneum. No pathologic adenopathy within the abdomen or pelvis. Reproductive: Prostate is unremarkable. Other: No free fluid or free gas.  No abdominal wall hernia. Musculoskeletal: Postsurgical changes are seen from lumbosacral fusion spanning L3 through S2. No acute bony abnormalities. Reconstructed images demonstrate no additional findings. IMPRESSION: 1. No acute intra-abdominal or intrapelvic process. 2. Markedly atrophic left kidney. 3. Linear radiopaque device within the distal IVC and bilateral common iliac veins, of uncertain etiology. Numerous venous collaterals are seen within the retroperitoneum. 4. Aortic Atherosclerosis (ICD10-I70.0). Electronically Signed   By: Randa Ngo M.D.   On: 06/28/2020 02:28    Procedures .Critical Care Performed by: Joanne Gavel, PA-C Authorized by: Joanne Gavel, PA-C   Critical care provider statement:    Critical care time (minutes):  50   Critical care time was exclusive of:  Separately billable procedures and treating other patients and teaching time   Critical care was necessary to treat or prevent imminent or life-threatening deterioration of the following conditions:  Circulatory failure   Critical care was time spent personally by me on the following activities:  Ordering and performing treatments and interventions, ordering and review of laboratory studies, ordering and review of radiographic studies, pulse oximetry, re-evaluation of patient's condition, review of old charts, obtaining history from patient or surrogate, examination of patient, evaluation of patient's response to treatment and development of treatment plan with patient or surrogate   I assumed direction of critical care for this patient from another provider in my specialty: no     (including critical care time)  Medications Ordered in ED Medications  famotidine (PEPCID) IVPB 20 mg premix (20 mg Intravenous New Bag/Given 06/28/20 0442)  sodium  chloride flush (NS) 0.9 % injection 3 mL (3 mLs Intravenous Given 06/28/20 0038)  sodium chloride 0.9 % bolus 500 mL (0 mLs Intravenous Stopped 06/28/20 0230)  ondansetron (ZOFRAN) injection 4 mg (4 mg Intravenous Given 06/28/20 0034)  magnesium sulfate IVPB 2 g 50 mL (0 g Intravenous Stopped 06/28/20 0346)  iohexol (OMNIPAQUE) 300 MG/ML solution 125 mL (125 mLs Intravenous Contrast Given 06/28/20 0157)  sodium chloride 0.9 % bolus 1,000 mL (1,000 mLs Intravenous New Bag/Given 06/28/20 0442)    ED Course  I have reviewed the triage vital signs and the nursing notes.  Pertinent labs & imaging results that were available during my care of the patient were reviewed by me and considered in my medical decision making (see chart for details).    MDM Rules/Calculators/A&P                          79 year old male with a history of CVA with right hemiparesis, DVT on Eliquis, CAD on Plavix s/p DES OM1 and RCA in 2013, CHF, diabetes mellitus type 2, hypercholesteremia presenting from home by EMS with syncopal versus near syncopal episode that occurred after several hours of N/VD that began suddenly at 19:00.   Patient presented with syncope in the setting of sudden onset nausea, vomiting, and diarrhea.  Suspect syncope secondary to orthostatic hypotension in the setting of dehydration.  He is hypotensive on arrival, 86/58.  He has positive orthostatics with a 34 point drop in his systolic pressure and 30 bpm increase in his heart rate. Pallor.  Mild tenderness palpation diffusely throughout the abdomen.  No focal tenderness. The patient was seen and independently evaluated by Dr. Wyvonnia Dusky, attending physician.  He did not have a fever with rectal temp.  He did have a mild leukocytosis of 13, but this could also be from hemoconcentration.  Did consider occult sepsis given that he had tachycardia with hypotension and mild leukocytosis, but less likely as there is not a focal source of infection.  He had  hypomagnesemia that was replenished in the ER.  No other electrolyte derangements.  Because he is anticoagulated and was having severe vomiting, head CT was obtained, which was unremarkable.  Chest x-ray was reassuring and did not have any acute findings, specifically no evidence of pneumonia.  UA with elevated specific gravity, consistent with dehydration, but no evidence of infection.  The patient's wife did have URI symptoms about a week ago.  COVID-19 test is currently pending.  Although it is likely that vomiting and diarrhea are viral, CT abdomen pelvis was obtained to rule out other sources of intra-abdominal infection.    CT abdomen pelvis was unremarkable for acute intra-abdominal or intrapelvic processes.  Of note, there is a linear radiopaque device within the distal IVC and bilateral common iliac veins of uncertain etiology.  There are numerous venous collaterals seen within the retroperitoneum.  Verified with patient that he has no history of an IVC filter.  He did have his gallbladder removed 35 years ago and cannot recall the procedure.  He did have surgery on his lumbar spine many years ago and they did leave hardware in following the procedure.  No thoracic spine surgery.  He did have a cardiac catheterization that was performed radially and 2013.  Per CT lumbar spine in 2015 performed at [facility] "Associated coarse calcification may relate to the aforementioned lesion versus prior infection or hemorrhage. 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    Initially, he was only given 500 cc of fluids given his history of CHF, but upon further review with last normal echo and continued orthostatic hypotension we will fluid resuscitate with 1 L. Per  chart review, patient had an echo in June 2020 with an EF of 55 to 60% with no evidence of systolic or diastolic dysfunction in June 2020.  He did appear to have an echo in 2015 with mild diastolic dysfunction.  However, he is no longer on Lasix.   Patient also failed fluid challenge.  Given persistent orthostatic hypotension and intractable vomiting, patient will need to be admitted.  Consulted the hospitalist team and Dr. Alcario Drought will accept the patient for admission.  Reviewed CT findings with Dr. Alcario Drought.  Suspect these are chronic, but he may benefit from vascular evaluation during admission.  Patient recheck prior to admission and blood pressure is 140/80 while patient is resting.  No active vomiting.  Nausea has improved.  The patient appears reasonably stabilized for admission considering the current resources, flow, and capabilities available in the ED at this time, and I doubt any other [facility] requiring further screening and/or treatment in the ED prior to admission.   Final Clinical Impression(s) / ED Diagnoses Final diagnoses:  Nausea vomiting and diarrhea  Acute dehydration  Syncope due to orthostatic hypotension    Rx / DC Orders ED Discharge Orders    None       Nilo Fallin A, PA-C 06/28/20 0507    Rancour, Annie Main,  MD 06/28/20 1710

## 2020-06-28 NOTE — Progress Notes (Signed)
   Follow Up Note  HPI: 79 year old male with past medical history of diabetes mellitus with secondary peripheral neuropathy and stage II chronic kidney disease, chronic diastolic heart failure and history of CAD/CVA on Plavix 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  Patient cannot recall the incident, but feels that he likely had a syncopal event.  His wife is also recovering from a recent GI illness. Pt admitted earlier this morning.  Seen in the emergency room.  Brought in the hospital service.  Given IV fluids.  Echocardiogram noteworthy for mild diastolic dysfunction.  Patient feeling somewhat better.  Exam: CV: Regular rate and rhythm, S1-S2 Lungs: Clear to auscultation bilaterally Abd: Soft, nontender, nondistended, positive bowel sounds Ext: No clubbing or cyanosis or edema  Principal Problem:   Syncope: Likely vasovagal versus orthostatic from volume loss.  Gently hydrating.  Follow electrolytes.  Anticipate discharge tomorrow. Active Problems:    Diabetes mellitus type 2, well controlled with secondary stage II chronic kidney disease and peripheral neuropathy: Continue home medications.  Follow CBGs.  A1c notes good control at 6.7  Overweight: Patient is criteria BMI greater than 25    Hemiparesis affecting right side as late effect of cerebrovascular accident (CVA) (Normal)   Chronic anticoagulation: Continue anticoagulation.    Nausea vomiting and diarrhea: Suspect viral gastroenteritis.  Treat symptoms.      Chronic diastolic CHF (congestive heart failure) [facility]): Monitor input and output.  Patient likely hypovolemic from GI illness   Disposition:

## 2020-06-28 NOTE — ED Notes (Signed)
Lunch Tray Ordered @ 1023. 

## 2020-06-28 NOTE — H&P (Signed)
History and Physical    Luke Rivera SFK:[id number] DOB: 01-14-1941 DOA: 06/27/2020  PCP: Kristopher Glee., MD  Patient coming from: Home  I have personally briefly reviewed patient's old medical records in [place]  Chief Complaint: N/V/D syncope  HPI: Luke Rivera is a 79 y.o. 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  CHF, DM2, stroke with residual R sided weakness and mild aphasia.  Pt presents to ED with c/o multiple episodes of NBNB emesis, also NB diarrhea.  Symptoms onset fairly suddenly at 1900 last evening.  Wife had similar symptoms last week.  Patient had been feeling well earlier in day prior to symptom onset.   The patient's wife reports that the patient was on the toilet when his eyes suddenly started to roll back in his head and he made a "funny sound and had heavy breathing" for 2 to 3 seconds when he became unresponsive and started to lean forward as if he was going to fall off the toilet.  She was able to catch him.  He did not fall and he did not hit his head.  The patient cannot recall the incident, and believes that he had a syncopal episode.  No shaking, jerking, tongue biting, or urinary or fecal incontinence.   ED Course: Did have another episode of emesis in ED, otherwise just has mild headache.  No fevers, chills, CP, SOB, cough, ABD pain, melena, hematochezia.  17+ point systolic BP drop with standing.  Pt given 1.5L IVF bolus in ED, hospitalist asked to admit.  CT abd/pelvis without acute findings, discussion of findings in A/P below.   Review of Systems: As per HPI, otherwise all review of systems negative.  Past Medical History:  Diagnosis Date  . CAD (coronary artery disease)   . CHF (congestive heart failure) ([place])   . Chronic anticoagulation   . DM2 (diabetes mellitus, type 2) ([place])   . DVT (deep venous thrombosis) ([facility])    Unprovoked, chronic  eliquis now  . GERD (gastroesophageal reflux disease)   . HTN (hypertension)   . TIA (transient ischemic attack)     History reviewed. No pertinent surgical history.   reports that he has quit smoking. He has never used smokeless tobacco. He reports that he does not drink alcohol and does not use drugs.  No Known Allergies  Family History  Problem Relation Age of Onset  . Hypertension Father   . Heart disease Mother   . Heart attack Brother      Prior to Admission medications   Medication Sig Start Date End Date Taking? Authorizing Provider  acetaminophen (TYLENOL) 325 MG tablet Take 1-2 tablets (325-650 mg total) by mouth every 4 (four) hours as needed for mild pain. 06/15/19  Yes Love, Ivan Anchors, PA-C  apixaban (ELIQUIS) 5 MG TABS tablet Take 1 tablet (5 mg total) by mouth 2 (two) times daily. 06/07/19  Yes Meccariello, Bernita Raisin, DO  atorvastatin (LIPITOR) 80 MG tablet Take 40 mg by mouth at bedtime.    Yes [provider]  Cholecalciferol (VITAMIN D3) 50 MCG (2000 UT) TABS Take 4,000 Units by mouth daily.   Yes [provider]  citalopram (CELEXA) 10 MG tablet Take 1 tablet (10 mg total) by mouth daily. Patient taking differently: Take 5 mg by mouth daily.  07/08/19  Yes Kirsteins, Luanna Salk, MD  clopidogrel (PLAVIX) 75 MG tablet Take 1  tablet (75 mg total) by mouth daily. 06/29/19  Yes Love, Ivan Anchors, PA-C  CYANOCOBALAMIN PO Take 5,000 Units by mouth every Sunday.   Yes [provider]  diclofenac sodium (VOLTAREN) 1 % GEL Apply 2 g topically 4 (four) times daily. Patient taking differently: Apply 2 g topically 4 (four) times daily as needed (Shoulder pain).  06/29/19  Yes Love, Ivan Anchors, PA-C  insulin glargine (LANTUS) 100 UNIT/ML injection Inject 0.22 mLs (22 Units total) into the skin at bedtime. Patient taking differently: Inject 10 Units into the skin at bedtime.  07/02/19  Yes Love, Ivan Anchors, PA-C  metFORMIN (GLUCOPHAGE) 500 MG tablet Take 500 mg by mouth  daily with breakfast.   Yes [provider]  pantoprazole (PROTONIX) 40 MG tablet Take 40 mg by mouth daily.   Yes [provider]  potassium chloride (KLOR-CON) 10 MEQ tablet Take 10 mEq by mouth daily.   Yes [provider]  tiZANidine (ZANAFLEX) 4 MG tablet TAKE 1 TABLET (4 MG TOTAL) BY MOUTH AT BEDTIME. 02/25/20  Yes Charlett Blake, MD    Physical Exam: Vitals:   06/28/20 0259 06/28/20 0330 06/28/20 0432 06/28/20 0515  BP: 133/62 122/72 135/74 (!) 129/54  Pulse: 83 90 96 79  Resp: 12 17 18 12   Temp:      TempSrc:      SpO2: 96% 100% 98% 99%  Weight:      Height:        Constitutional: NAD, calm, comfortable Eyes: PERRL, lids and conjunctivae normal ENMT: Mucous membranes are moist. Posterior pharynx clear of any exudate or lesions.Normal dentition.  Neck: normal, supple, no masses, no thyromegaly Respiratory: clear to auscultation bilaterally, no wheezing, no crackles. Normal respiratory effort. No accessory muscle use.  Cardiovascular: Regular rate and rhythm, no murmurs / rubs / gallops. No extremity edema. 2+ pedal pulses. No carotid bruits.  Abdomen: no tenderness, no masses palpated. No hepatosplenomegaly. Bowel sounds positive.  Musculoskeletal: no clubbing / cyanosis. No joint deformity upper and lower extremities. Good ROM, no contractures. Normal muscle tone.  Skin: no rashes, lesions, ulcers. No induration Neurologic: R sided 4/5 strength in RUE and RLE, mild R facial weakness.  Mild expressive aphasia.  All of this chronic since prior stroke. Psychiatric: Normal judgment and insight. Alert and oriented x 3. Normal mood.    Labs on Admission: I have personally reviewed following labs and imaging studies  CBC: Recent Labs  Lab 06/27/20 2233  WBC 13.4*  HGB 14.5  HCT 45.6  MCV 84.1  PLT 283*   Basic Metabolic Panel: Recent Labs  Lab 06/27/20 2233 06/27/20 2248  NA 140  --   K 3.8  --   CL 106  --   CO2 21*  --   GLUCOSE  207*  --   BUN 16  --   CREATININE 1.13  --   CALCIUM 9.0  --   MG  --  1.4*   GFR: Estimated Creatinine Clearance: 68.1 mL/min (by C-G formula based on SCr of 1.13 mg/dL). Liver Function Tests: Recent Labs  Lab 06/27/20 2248  AST 20  ALT 21  ALKPHOS 81  BILITOT 1.6*  PROT 6.9  ALBUMIN 4.1   Recent Labs  Lab 06/27/20 2248  LIPASE 42   No results for input(s): AMMONIA in the last 168 hours. Coagulation Profile: No results for input(s): INR, PROTIME in the last 168 hours. Cardiac Enzymes: No results for input(s): CKTOTAL, CKMB, CKMBINDEX, TROPONINI in the last 168  hours. BNP (last 3 results) No results for input(s): PROBNP in the last 8760 hours. HbA1C: No results for input(s): HGBA1C in the last 72 hours. CBG: Recent Labs  Lab 06/27/20 2345 06/28/20 0534  GLUCAP 180* 177*   Lipid Profile: No results for input(s): CHOL, HDL, LDLCALC, TRIG, CHOLHDL, LDLDIRECT in the last 72 hours. Thyroid Function Tests: No results for input(s): TSH, T4TOTAL, FREET4, T3FREE, THYROIDAB in the last 72 hours. Anemia Panel: No results for input(s): VITAMINB12, FOLATE, FERRITIN, TIBC, IRON, RETICCTPCT in the last 72 hours. Urine analysis:    Component Value Date/Time   COLORURINE YELLOW 06/28/2020 0319   APPEARANCEUR CLEAR 06/28/2020 0319   LABSPEC >1.046 (H) 06/28/2020 0319   PHURINE 5.0 06/28/2020 0319   GLUCOSEU NEGATIVE 06/28/2020 0319   HGBUR NEGATIVE 06/28/2020 0319   BILIRUBINUR NEGATIVE 06/28/2020 0319   KETONESUR NEGATIVE 06/28/2020 0319   PROTEINUR NEGATIVE 06/28/2020 0319   NITRITE NEGATIVE 06/28/2020 0319   LEUKOCYTESUR NEGATIVE 06/28/2020 0319    Radiological Exams on Admission: DG Chest 2 View  Result Date: 06/28/2020 CLINICAL DATA:  Nausea and vomiting, syncope EXAM: CHEST - 2 VIEW COMPARISON:  06/01/2019 FINDINGS: The heart size and mediastinal contours are within normal limits. Both lungs are clear. The visualized skeletal structures are unremarkable.  IMPRESSION: No active cardiopulmonary disease. Electronically Signed   By: Randa Ngo M.D.   On: 06/28/2020 01:05   CT Head Wo Contrast  Result Date: 06/28/2020 CLINICAL DATA:  Syncope EXAM: CT HEAD WITHOUT CONTRAST TECHNIQUE: Contiguous axial images were obtained from the base of the skull through the vertex without intravenous contrast. COMPARISON:  June 01, 2019 MRI FINDINGS: Brain: No evidence of acute territorial infarction, hemorrhage, hydrocephalus,extra-axial collection or mass lesion/mass effect. There is dilatation the ventricles and sulci consistent with age-related atrophy. Low-attenuation changes in the deep white matter consistent with small vessel ischemia. Area of encephalomalacia involving the superior left frontoparietal lobe. Vascular: Calcifications are seen within the internal carotid artery. Skull: The skull is intact. No fracture or focal lesion identified. Sinuses/Orbits: The visualized paranasal sinuses and mastoid air cells are clear. The orbits and globes intact. Other: None IMPRESSION: No acute intracranial abnormality. Findings consistent with age related atrophy and chronic small vessel ischemia Area of encephalomalacia involving the left frontoparietal lobe. Electronically Signed   By: Prudencio Pair M.D.   On: 06/28/2020 00:58   CT ABDOMEN PELVIS W CONTRAST  Result Date: 06/28/2020 CLINICAL DATA:  Nausea and vomiting, diabetes EXAM: CT ABDOMEN AND PELVIS WITH CONTRAST TECHNIQUE: Multidetector CT imaging of the abdomen and pelvis was performed using the standard protocol following bolus administration of intravenous contrast. CONTRAST:  152mL OMNIPAQUE IOHEXOL 300 MG/ML  SOLN COMPARISON:  06/01/2019 FINDINGS: Lower chest: No acute pleural or parenchymal lung disease. Hepatobiliary: No focal liver abnormality is seen. Status post cholecystectomy. No biliary dilatation. Pancreas: Unremarkable. No pancreatic ductal dilatation or surrounding inflammatory changes. Spleen: Normal  in size without focal abnormality. Adrenals/Urinary Tract: The left kidney is markedly atrophic, with multiple coarse calcifications measuring up to 1.3 cm in size. There is a lobular 6.3 cm right renal cyst identified. No right-sided calculi or obstruction. Bladder is minimally distended with no focal abnormality. No adrenal masses. Stomach/Bowel: No bowel obstruction or ileus. No bowel wall thickening or inflammatory change. The appendix is not identified. Vascular/Lymphatic: There is moderate atherosclerosis throughout the aorta. There is a linear radiopaque device within the distal IVC and bilateral common iliac veins. Numerous venous collaterals are seen within the retroperitoneum. No pathologic adenopathy  within the abdomen or pelvis. Reproductive: Prostate is unremarkable. Other: No free fluid or free gas.  No abdominal wall hernia. Musculoskeletal: Postsurgical changes are seen from lumbosacral fusion spanning L3 through S2. No acute bony abnormalities. Reconstructed images demonstrate no additional findings. IMPRESSION: 1. No acute intra-abdominal or intrapelvic process. 2. Markedly atrophic left kidney. 3. Linear radiopaque device within the distal IVC and bilateral common iliac veins, of uncertain etiology. Numerous venous collaterals are seen within the retroperitoneum. 4. Aortic Atherosclerosis (ICD10-I70.0). Electronically Signed   By: Randa Ngo M.D.   On: 06/28/2020 02:28    EKG: Independently reviewed.  Assessment/Plan Principal Problem:   Nausea vomiting and diarrhea Active Problems:   Diabetes mellitus type 2 in obese ([facility])   Syncope   Hemiparesis affecting right side as late effect of cerebrovascular accident (CVA) ([facility])   Chronic anticoagulation    1. N/V/D - 1. Suspect infectious gastroenteritis, presumably pt has whatever stomach bug his wife just had last week. 2. Zofran PRN 3. IVF: 1.5L bolus in ED + 125 cc/hr NS 4. Clear liquid diet, see if pt can tolerate this + meds  at least 5. Mg replaced in ED 6. Repeat BMP this morning 2. Syncope - 1. Probably due to #1 above with significant orthostatics 2. IVF as above 3. Syncope pathway 4. 2d echo ordered 5. Tele monitor 3. DM2 - 1. Hold home lantus and metformin 2. Sensitive SSI Q4H 4. Hemiparesis and mild aphasia post stroke - 1. Chronic, baseline, stable 5. Chronic anticoagulation - 1. Pt on chronic eliquis due to recurrent unprovoked DVTs, h/o chronic DVTs 2. Suspect that the h/o DVTs is whats responsible for the findings of "Linear radiopaque device within the distal IVC and bilateral common iliac veins, of uncertain etiology. Numerous venous collaterals are seen within the retroperitoneum." as seen on his CT today 3. Has h/o CT in 2015 at [facility] showing: "Findings of chronic deep venous thrombosis involving the IVC and bilateral common iliac veins."  They further note "Calcification is seen within the lumen of the IVC as well as the bilateral common iliac veins, most consistent with chronic deep venous thrombus." 4. Suspect this Calcification may be what radiologist is seeing on CT today.  DVT prophylaxis: Cont eliquis - may need heparin gtt if unable to take POs Code Status: Full Family Communication: No family in room Disposition Plan: Home after able to take POs Consults called: None Admission status: Place in 39    Reinhold Rickey, [place] Hospitalists  How to contact the [facility] Attending or Consulting provider [place] or covering provider during after hours [place], for this patient?  1. Check the care team in [facility] and look for a) attending/consulting [facility] provider listed and b) the [facility] team listed 2. Log into [email or web address]  Amion Physician Scheduling and messaging for groups and whole hospitals  On call and physician scheduling software for group practices, residents, hospitalists and other medical providers for call, clinic, rotation and shift schedules. OnCall [employer] is a hospital-wide  system for scheduling doctors and paging doctors on call. EasyPlot is for scientific plotting and data analysis.  [email or web address]  and use [place]'s universal password to access. If you do not have the password, please contact the hospital operator.  3. Locate the [facility] provider you are looking for under [facility] and page to a number that you can be directly reached. 4. If you still have difficulty reaching the provider, please page the [facility] (Director on Call) for  the Hospitalists listed on amion for assistance.  06/28/2020, 5:52 AM

## 2020-06-28 NOTE — ED Notes (Signed)
Breakfast ordered--Luke Rivera 

## 2020-06-28 NOTE — ED Notes (Addendum)
Pt tolerated fluid given, denies N/V currently

## 2020-06-28 NOTE — ED Notes (Signed)
Pt vomited upon completion of Orthostatic VS- PA notified

## 2020-06-28 NOTE — ED Notes (Signed)
Breakfast tray at bedside 

## 2020-06-29 DIAGNOSIS — E7800 Pure hypercholesterolemia, unspecified: Secondary | ICD-10-CM | POA: Diagnosis present

## 2020-06-29 DIAGNOSIS — Z7901 Long term (current) use of anticoagulants: Secondary | ICD-10-CM

## 2020-06-29 DIAGNOSIS — R55 Syncope and collapse: Secondary | ICD-10-CM | POA: Diagnosis present

## 2020-06-29 DIAGNOSIS — I5032 Chronic diastolic (congestive) heart failure: Secondary | ICD-10-CM | POA: Diagnosis present

## 2020-06-29 DIAGNOSIS — I130 Hypertensive heart and chronic kidney disease with heart failure and stage 1 through stage 4 chronic kidney disease, or unspecified chronic kidney disease: Secondary | ICD-10-CM | POA: Diagnosis present

## 2020-06-29 DIAGNOSIS — Z7902 Long term (current) use of antithrombotics/antiplatelets: Secondary | ICD-10-CM | POA: Diagnosis not present

## 2020-06-29 DIAGNOSIS — Z87891 Personal history of nicotine dependence: Secondary | ICD-10-CM | POA: Diagnosis not present

## 2020-06-29 DIAGNOSIS — I69320 Aphasia following cerebral infarction: Secondary | ICD-10-CM | POA: Diagnosis not present

## 2020-06-29 DIAGNOSIS — E1122 Type 2 diabetes mellitus with diabetic chronic kidney disease: Secondary | ICD-10-CM | POA: Diagnosis present

## 2020-06-29 DIAGNOSIS — I69351 Hemiplegia and hemiparesis following cerebral infarction affecting right dominant side: Secondary | ICD-10-CM | POA: Diagnosis not present

## 2020-06-29 DIAGNOSIS — Z79899 Other long term (current) drug therapy: Secondary | ICD-10-CM | POA: Diagnosis not present

## 2020-06-29 DIAGNOSIS — I951 Orthostatic hypotension: Secondary | ICD-10-CM | POA: Diagnosis present

## 2020-06-29 DIAGNOSIS — Z86718 Personal history of other venous thrombosis and embolism: Secondary | ICD-10-CM | POA: Diagnosis not present

## 2020-06-29 DIAGNOSIS — Z8249 Family history of ischemic heart disease and other diseases of the circulatory system: Secondary | ICD-10-CM | POA: Diagnosis not present

## 2020-06-29 DIAGNOSIS — Z955 Presence of coronary angioplasty implant and graft: Secondary | ICD-10-CM | POA: Diagnosis not present

## 2020-06-29 DIAGNOSIS — E1142 Type 2 diabetes mellitus with diabetic polyneuropathy: Secondary | ICD-10-CM | POA: Diagnosis present

## 2020-06-29 DIAGNOSIS — E663 Overweight: Secondary | ICD-10-CM | POA: Diagnosis present

## 2020-06-29 DIAGNOSIS — Z794 Long term (current) use of insulin: Secondary | ICD-10-CM | POA: Diagnosis not present

## 2020-06-29 DIAGNOSIS — N182 Chronic kidney disease, stage 2 (mild): Secondary | ICD-10-CM | POA: Diagnosis present

## 2020-06-29 DIAGNOSIS — R197 Diarrhea, unspecified: Secondary | ICD-10-CM | POA: Diagnosis present

## 2020-06-29 DIAGNOSIS — I2510 Atherosclerotic heart disease of native coronary artery without angina pectoris: Secondary | ICD-10-CM | POA: Diagnosis present

## 2020-06-29 DIAGNOSIS — Z6829 Body mass index (BMI) 29.0-29.9, adult: Secondary | ICD-10-CM | POA: Diagnosis not present

## 2020-06-29 DIAGNOSIS — Z20822 Contact with and (suspected) exposure to covid-19: Secondary | ICD-10-CM | POA: Diagnosis present

## 2020-06-29 DIAGNOSIS — E860 Dehydration: Secondary | ICD-10-CM

## 2020-06-29 DIAGNOSIS — K219 Gastro-esophageal reflux disease without esophagitis: Secondary | ICD-10-CM | POA: Diagnosis present

## 2020-06-29 LAB — BASIC METABOLIC PANEL
Anion gap: 8 (ref 5–15)
BUN: 14 mg/dL (ref 8–23)
CO2: 21 mmol/L — ABNORMAL LOW (ref 22–32)
Calcium: 7.8 mg/dL — ABNORMAL LOW (ref 8.9–10.3)
Chloride: 109 mmol/L (ref 98–111)
Creatinine, Ser: 0.93 mg/dL (ref 0.61–1.24)
GFR calc Af Amer: >60 mL/min (ref >60)
GFR calc non Af Amer: >60 mL/min (ref >60)
Glucose, Bld: 140 mg/dL — ABNORMAL HIGH (ref 70–99)
Potassium: 4 mmol/L (ref 3.5–5.1)
Sodium: 138 mmol/L (ref 135–145)

## 2020-06-29 LAB — CBC
HCT: 38 % — ABNORMAL LOW (ref 39.0–52.0)
Hemoglobin: 12 g/dL — ABNORMAL LOW (ref 13.0–17.0)
MCH: 27.1 pg (ref 26.0–34.0)
MCHC: 31.6 g/dL (ref 30.0–36.0)
MCV: 85.8 fL (ref 80.0–100.0)
Platelets: 440 10*3/uL — ABNORMAL HIGH (ref 150–400)
RBC: 4.43 MIL/uL (ref 4.22–5.81)
RDW: 17 % — ABNORMAL HIGH (ref 11.5–15.5)
WBC: 5.4 10*3/uL (ref 4.0–10.5)
nRBC: 0 % (ref 0.0–0.2)

## 2020-06-29 LAB — GLUCOSE, CAPILLARY
Glucose-Capillary: 130 mg/dL — ABNORMAL HIGH (ref 70–99)
Glucose-Capillary: 141 mg/dL — ABNORMAL HIGH (ref 70–99)
Glucose-Capillary: 146 mg/dL — ABNORMAL HIGH (ref 70–99)

## 2020-06-29 NOTE — Discharge Instructions (Signed)

## 2020-06-29 NOTE — Progress Notes (Signed)
Pt with pauses this shift, Oncall MD notified pt asymptomatic, monitored per MD

## 2020-06-29 NOTE — Evaluation (Signed)
Physical Therapy Evaluation Patient Details Name: Luke Rivera MRN: [id number] DOB: 1941-04-16 Today's Date: 06/29/2020   History of Present Illness  Pt is an 79 y/o male admitted secondary to syncopal episode, likely from orthostatic hypotension vs vasovagal. PMH includes CAD, DVT, DM, CKD, and CVA with R sided weakness.   Clinical Impression  Pt admitted secondary to problem above with deficits below. Pt requiring min to min guard A for mobility tasks using RW. Pt with R foot drop and R UE and RLE weakness at baseline from previous CVA. Pt reports wife is able to assist as needed. Reports he does not want any HHPT follow up. Will continue to follow acutely to maximize functional mobility independence and safety.      Follow Up Recommendations No PT follow up;Supervision/Assistance - 24 hour (Pt refusing HHPT)    Equipment Recommendations  None recommended by PT    Recommendations for Other Services       Precautions / Restrictions Precautions Precautions: Fall Required Braces or Orthoses: Other Brace Other Brace: Pt reports using AFO at baseline, but does not have it with him.  Restrictions Weight Bearing Restrictions: No      Mobility  Bed Mobility               General bed mobility comments: Sitting EOB upon entry.   Transfers Overall transfer level: Needs assistance Equipment used: Rolling walker (2 wheeled) Transfers: Sit to/from Stand Sit to Stand: Min assist;From elevated surface         General transfer comment: Min A for steadying assist to stand from elevated surface. Required momentum to stand.   Ambulation/Gait Ambulation/Gait assistance: Min guard Gait Distance (Feet): 75 Feet Assistive device: Rolling walker (2 wheeled) Gait Pattern/deviations: Step-to pattern;Decreased step length - left;Decreased step length - right;Decreased dorsiflexion - right Gait velocity: Decreased   General Gait Details: Step to pattern secondary to RLE weakness. Also  noted R foot drop; pt reports he does not have his brace at hospital. Pt asymptomatic throughout.   Stairs            Wheelchair Mobility    Modified Rankin (Stroke Patients Only)       Balance Overall balance assessment: Needs assistance Sitting-balance support: No upper extremity supported;Feet supported Sitting balance-Leahy Scale: Fair     Standing balance support: Bilateral upper extremity supported;During functional activity Standing balance-Leahy Scale: Poor Standing balance comment: Reliant on BUE support                              Pertinent Vitals/Pain Pain Assessment: No/denies pain    Home Living Family/patient expects to be discharged to:: Private residence Living Arrangements: Spouse/significant other Available Help at Discharge: Family;Available 24 hours/day Type of Home: House Home Access: Level entry     Home Layout: One level Home Equipment: Walker - 2 wheels;Shower seat      Prior Function Level of Independence: Independent with assistive device(s)         Comments: Uses RW and R AFO at baseline.      Hand Dominance        Extremity/Trunk Assessment   Upper Extremity Assessment Upper Extremity Assessment: RUE deficits/detail RUE Deficits / Details: RUE weakness at baseline     Lower Extremity Assessment Lower Extremity Assessment: RLE deficits/detail RLE Deficits / Details: RLE weakness at baseline. Noted foot drop during mobility. Pt reports this is baseline.     Cervical / Trunk Assessment  Cervical / Trunk Assessment: Kyphotic  Communication   Communication: No difficulties  Cognition Arousal/Alertness: Awake/alert Behavior During Therapy: WFL for tasks assessed/performed Overall Cognitive Status: No family/caregiver present to determine baseline cognitive functioning                                        General Comments      Exercises     Assessment/Plan    PT Assessment Patient  needs continued PT services  PT Problem List Decreased strength;Decreased balance;Decreased mobility;Decreased activity tolerance;Decreased knowledge of use of DME;Decreased knowledge of precautions       PT Treatment Interventions DME instruction;Gait training;Functional mobility training;Therapeutic activities;Therapeutic exercise;Balance training;Patient/family education    PT Goals (Current goals can be found in the Care Plan section)  Acute Rehab PT Goals Patient Stated Goal: to go home PT Goal Formulation: With patient Time For Goal Achievement: 07/13/20 Potential to Achieve Goals: Good    Frequency Min 3X/week   Barriers to discharge        Co-evaluation               AM-PAC PT "6 Clicks" Mobility  Outcome Measure Help needed turning from your back to your side while in a flat bed without using bedrails?: A Little Help needed moving from lying on your back to sitting on the side of a flat bed without using bedrails?: A Little Help needed moving to and from a bed to a chair (including a wheelchair)?: A Little Help needed standing up from a chair using your arms (e.g., wheelchair or bedside chair)?: A Little Help needed to walk in hospital room?: A Little Help needed climbing 3-5 steps with a railing? : A Lot 6 Click Score: 17    End of Session Equipment Utilized During Treatment: Gait belt Activity Tolerance: Patient tolerated treatment well Patient left: in bed;with call bell/phone within reach (sitting EOB ) Nurse Communication: Mobility status PT Visit Diagnosis: Unsteadiness on feet (R26.81);Muscle weakness (generalized) (M62.81)    Time: [id number] PT Time Calculation (min) (ACUTE ONLY): 14 min   Charges:   PT Evaluation $PT Eval Low Complexity: 1 Low          Lou Miner, DPT  Acute Rehabilitation Services  Pager: [phone] Office: [phone]   Rudean Hitt 06/29/2020, 12:43 PM

## 2020-06-29 NOTE — Discharge Summary (Signed)
Physician Discharge Summary  Luke Rivera BZJ:[id number] DOB: [birth year] DOA: 06/27/2020  PCP: Kristopher Glee., MD  Admit date: 06/27/2020 Discharge date: 06/29/2020  Time spent: 50* minutes  Recommendations for Outpatient Follow-up:  1. 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)   Chronic diastolic CHF (congestive heart failure) ([place])   Discharge Condition: Stable  Diet recommendation: Heart healthy diet  Filed Weights   06/27/20 2322 06/29/20 0521  Weight: 108 kg 111.2 kg    History of present illness:  79 year old male with past medical history of diabetes mellitus with secondary peripheral neuropathy and stage II chronic kidney disease, chronic diastolic heart failure and history of CAD/CVA on Plavix 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  Patient cannot recall the incident, but feels that he likely had a syncopal event.  His wife is also recovering from a recent GI illness.  Hospital Course:   Syncope-secondary to orthostatic hypotension.  Resolved.  Patient had positive orthostasis in the hospital.  Likely from diarrhea.  Patient had an echocardiogram which showed grade 1 diastolic dysfunction with EF 55 to 60%.  Physical therapy evaluation was obtained and patient will be discharged home with home health PT.  Will order TED hose for lower extremities.  Diarrhea-improved, patient says that he took medication for constipation which makes him have loose BMs.  He feels that he is back to baseline and wants  to go home.  Diabetes mellitus type 2-continue home medications  Hemiparesis/CVA-continue Eliquis, Plavix  Chronic diastolic CHF-patient is euvolemic.    Procedures:  Echocardiogram  Consultations:  None  Discharge Exam: Vitals:   06/28/20 2353 06/29/20 0355  BP:    Pulse:    Resp:    Temp: 98.3 F (36.8 C) 98.1 F (36.7 C)  SpO2:      General: Appears in no acute distress Cardiovascular: S1-S2, regular Respiratory: Clear to auscultation bilaterally  Discharge Instructions   Discharge Instructions    Diet - low sodium heart healthy   Complete by: As directed    Increase activity slowly   Complete by: As directed      Allergies as of 06/29/2020   No Known Allergies     Medication List    TAKE these medications   acetaminophen 325 MG tablet Commonly known as: TYLENOL Take 1-2 tablets (325-650 mg total) by mouth every 4 (four) hours as needed for mild pain.   apixaban 5 MG Tabs tablet Commonly known as: ELIQUIS Take 1 tablet (5 mg total) by mouth 2 (two) times daily.   atorvastatin 80 MG tablet Commonly known as: LIPITOR Take 40 mg by mouth at bedtime.   citalopram 10 MG tablet Commonly known as: CELEXA Take 1 tablet (10 mg total) by mouth daily. What changed: how much to take   clopidogrel 75 MG tablet Commonly known as: PLAVIX Take 1 tablet (75 mg total) by mouth daily.   CYANOCOBALAMIN PO Take 5,000 Units by mouth every Sunday.   diclofenac sodium 1 % Gel Commonly known as: VOLTAREN Apply 2 g  topically 4 (four) times daily. What changed:   when to take this  reasons to take this   insulin glargine 100 UNIT/ML injection Commonly known as: LANTUS Inject 0.22 mLs (22 Units total) into the skin at bedtime. What changed: how much to take   metFORMIN 500 MG tablet Commonly known as: GLUCOPHAGE Take 500 mg by mouth daily with breakfast.   pantoprazole 40 MG tablet Commonly known as: PROTONIX Take 40 mg by mouth daily.    potassium chloride 10 MEQ tablet Commonly known as: KLOR-CON Take 10 mEq by mouth daily.   tiZANidine 4 MG tablet Commonly known as: ZANAFLEX TAKE 1 TABLET (4 MG TOTAL) BY MOUTH AT BEDTIME.   Vitamin D3 50 MCG (2000 UT) Tabs Take 4,000 Units by mouth daily.      No Known Allergies    The results of significant diagnostics from this hospitalization (including imaging, microbiology, ancillary and laboratory) are listed below for reference.    Significant Diagnostic Studies: DG Chest 2 View  Result Date: 06/28/2020 CLINICAL DATA:  Nausea and vomiting, syncope EXAM: CHEST - 2 VIEW COMPARISON:  06/01/2019 FINDINGS: The heart size and mediastinal contours are within normal limits. Both lungs are clear. The visualized skeletal structures are unremarkable. IMPRESSION: No active cardiopulmonary disease. Electronically Signed   By: Randa Ngo M.D.   On: 06/28/2020 01:05   CT Head Wo Contrast  Result Date: 06/28/2020 CLINICAL DATA:  Syncope EXAM: CT HEAD WITHOUT CONTRAST TECHNIQUE: Contiguous axial images were obtained from the base of the skull through the vertex without intravenous contrast. COMPARISON:  June 01, 2019 MRI FINDINGS: Brain: No evidence of acute territorial infarction, hemorrhage, hydrocephalus,extra-axial collection or mass lesion/mass effect. There is dilatation the ventricles and sulci consistent with age-related atrophy. Low-attenuation changes in the deep white matter consistent with small vessel ischemia. Area of encephalomalacia involving the superior left frontoparietal lobe. Vascular: Calcifications are seen within the internal carotid artery. Skull: The skull is intact. No fracture or focal lesion identified. Sinuses/Orbits: The visualized paranasal sinuses and mastoid air cells are clear. The orbits and globes intact. Other: None IMPRESSION: No acute intracranial abnormality. Findings consistent with age related atrophy and chronic small vessel ischemia Area of  encephalomalacia involving the left frontoparietal lobe. Electronically Signed   By: Prudencio Pair M.D.   On: 06/28/2020 00:58   CT ABDOMEN PELVIS W CONTRAST  Result Date: 06/28/2020 CLINICAL DATA:  Nausea and vomiting, diabetes EXAM: CT ABDOMEN AND PELVIS WITH CONTRAST TECHNIQUE: Multidetector CT imaging of the abdomen and pelvis was performed using the standard protocol following bolus administration of intravenous contrast. CONTRAST:  142mL OMNIPAQUE IOHEXOL 300 MG/ML  SOLN COMPARISON:  06/01/2019 FINDINGS: Lower chest: No acute pleural or parenchymal lung disease. Hepatobiliary: No focal liver abnormality is seen. Status post cholecystectomy. No biliary dilatation. Pancreas: Unremarkable. No pancreatic ductal dilatation or surrounding inflammatory changes. Spleen: Normal in size without focal abnormality. Adrenals/Urinary Tract: The left kidney is markedly atrophic, with multiple coarse calcifications measuring up to 1.3 cm in size. There is a lobular 6.3 cm right renal cyst identified. No right-sided calculi or obstruction. Bladder is minimally distended with no focal abnormality. No adrenal masses. Stomach/Bowel: No bowel obstruction or ileus. No bowel wall thickening or inflammatory change. The appendix is not identified. Vascular/Lymphatic: There is moderate atherosclerosis throughout the aorta. There is a linear radiopaque device within the distal IVC and bilateral common iliac veins. Numerous venous collaterals are seen within the retroperitoneum. No pathologic adenopathy within the abdomen or pelvis. Reproductive:  Prostate is unremarkable. Other: No free fluid or free gas.  No abdominal wall hernia. Musculoskeletal: Postsurgical changes are seen from lumbosacral fusion spanning L3 through S2. No acute bony abnormalities. Reconstructed images demonstrate no additional findings. IMPRESSION: 1. No acute intra-abdominal or intrapelvic process. 2. Markedly atrophic left kidney. 3. Linear radiopaque device  within the distal IVC and bilateral common iliac veins, of uncertain etiology. Numerous venous collaterals are seen within the retroperitoneum. 4. Aortic Atherosclerosis (ICD10-I70.0). Electronically Signed   By: Randa Ngo M.D.   On: 06/28/2020 02:28   ECHOCARDIOGRAM COMPLETE  Result Date: 06/28/2020    ECHOCARDIOGRAM REPORT   Patient Name:   Luke Rivera Date of Exam: 06/28/2020 Medical Rec #:  [id number]    Height:       75.0 in Accession #:    [id number]   Weight:       238.0 lb Date of Birth:  07-12-1941     BSA:          2.363 m Patient Age:    19 years     BP:           138/56 mmHg Patient Gender: M            HR:           84 bpm. Exam Location:  Inpatient Procedure: 2D Echo Indications:    Syncope 780.2 / R55  History:        Patient has prior history of Echocardiogram examinations, most                 recent 06/01/2019. CHF, CAD, TIA, Arrythmias:Atrial Fibrillation;                 Risk Factors:Diabetes and Hypertension. Chronic recurrent DVTs.  Sonographer:    Darlina Sicilian RDCS Referring Phys: [phone] JARED M GARDNER  Sonographer Comments: Image acquisition challenging due to respiratory motion. Echo performed with patient laying on right side, unable to turn on left side. IMPRESSIONS  1. Left ventricular ejection fraction, by estimation, is 55 to 60%. The left ventricle has normal function. The left ventricle has no regional wall motion abnormalities. Left ventricular diastolic parameters are consistent with Grade I diastolic dysfunction (impaired relaxation).  2. Right ventricular systolic function is normal. The right ventricular size is mildly enlarged. Tricuspid regurgitation signal is inadequate for assessing PA pressure.  3. Right atrial size was mildly dilated.  4. The mitral valve is normal in structure. No evidence of mitral valve regurgitation. No evidence of mitral stenosis.  5. The aortic valve is tricuspid. Aortic valve regurgitation is not visualized. No aortic stenosis is present.  6.  Aortic dilatation noted. There is mild dilatation of the aortic root measuring 40 mm.  7. Technically difficult study with poor acoustic windows. FINDINGS  Left Ventricle: Left ventricular ejection fraction, by estimation, is 55 to 60%. The left ventricle has normal function. The left ventricle has no regional wall motion abnormalities. The left ventricular internal cavity size was normal in size. There is  no left ventricular hypertrophy. Left ventricular diastolic parameters are consistent with Grade I diastolic dysfunction (impaired relaxation). Right Ventricle: The right ventricular size is mildly enlarged. No increase in right ventricular wall thickness. Right ventricular systolic function is normal. Tricuspid regurgitation signal is inadequate for assessing PA pressure. Left Atrium: Left atrial size was normal in size. Right Atrium: Right atrial size was mildly dilated. Pericardium: There is no evidence of pericardial effusion. Mitral Valve: The mitral valve is normal in  structure. No evidence of mitral valve regurgitation. No evidence of mitral valve stenosis. Tricuspid Valve: The tricuspid valve is normal in structure. Tricuspid valve regurgitation is not demonstrated. Aortic Valve: The aortic valve is tricuspid. Aortic valve regurgitation is not visualized. No aortic stenosis is present. Pulmonic Valve: The pulmonic valve was not well visualized. Pulmonic valve regurgitation is not visualized. Aorta: Aortic dilatation noted. There is mild dilatation of the aortic root measuring 40 mm. Venous: The inferior vena cava was not well visualized. IAS/Shunts: No atrial level shunt detected by color flow Doppler.  LEFT VENTRICLE PLAX 2D LVIDd:         5.40 cm LVIDs:         3.70 cm LV PW:         1.00 cm LV IVS:        1.00 cm LVOT diam:     2.50 cm LV SV:         74 LV SV Index:   31 LVOT Area:     4.91 cm  RIGHT VENTRICLE RV S prime:     10.30 cm/s TAPSE (M-mode): 2.6 cm LEFT ATRIUM           Index       RIGHT  ATRIUM           Index LA diam:      3.30 cm 1.40 cm/m  RA Area:     18.50 cm LA Vol (A2C): 51.3 ml 21.71 ml/m RA Volume:   41.30 ml  17.48 ml/m LA Vol (A4C): 60.0 ml 25.39 ml/m  AORTIC VALVE LVOT Vmax:   71.00 cm/s LVOT Vmean:  54.000 cm/s LVOT VTI:    0.150 m  AORTA Ao Root diam: 4.00 cm MITRAL VALVE MV Area (PHT): 3.48 cm    SHUNTS MV Decel Time: 218 msec    Systemic VTI:  0.15 m MV E velocity: 72.80 cm/s  Systemic Diam: 2.50 cm Loralie Champagne MD Electronically signed by Loralie Champagne MD Signature Date/Time: 06/28/2020/3:17:48 PM    Final     Microbiology: Recent Results (from the past 240 hour(s))  SARS Coronavirus 2 by RT PCR (hospital order, performed in [place] hospital lab) Nasopharyngeal Nasopharyngeal Swab     Status: None   Collection Time: 06/28/20  2:28 AM   Specimen: Nasopharyngeal Swab  Result Value Ref Range Status   SARS Coronavirus 2 NEGATIVE NEGATIVE Final    Comment: (NOTE) SARS-CoV-2 target nucleic acids are NOT DETECTED.  The SARS-CoV-2 RNA is generally detectable in upper and lower respiratory specimens during the acute phase of infection. The lowest concentration of SARS-CoV-2 viral copies this assay can detect is 250 copies / mL. A negative result does not preclude SARS-CoV-2 infection and should not be used as the sole basis for treatment or other patient management decisions.  A negative result may occur with improper specimen collection / handling, submission of specimen other than nasopharyngeal swab, presence of viral mutation(s) within the areas targeted by this assay, and inadequate number of viral copies (<250 copies / mL). A negative result must be combined with clinical observations, patient history, and epidemiological information.  Fact Sheet for Patients:   StrictlyIdeas.no  Fact Sheet for Healthcare Providers: BankingDealers.co.za  This test is not yet approved or  cleared by the [place]  FDA and has been authorized for detection and/or diagnosis of SARS-CoV-2 by FDA under an Emergency Use Authorization (EUA).  This EUA will remain in effect (meaning this test can be used) for  the duration of the COVID-19 declaration under Section 564(b)(1) of the Act, 21 U.S.C. section 360bbb-3(b)(1), unless the authorization is terminated or revoked sooner.  Performed at [facility], [place] [address], [place], [place] [address]      Labs: Basic Metabolic Panel: Recent Labs  Lab 06/27/20 2233 06/27/20 2248 06/28/20 0520 06/29/20 0447  NA 140  --  139 138  K 3.8  --  4.5 4.0  CL 106  --  104 109  CO2 21*  --  24 21*  GLUCOSE 207*  --  193* 140*  BUN 16  --  19 14  CREATININE 1.13  --  1.18 0.93  CALCIUM 9.0  --  8.7* 7.8*  MG  --  1.4*  --   --    Liver Function Tests: Recent Labs  Lab 06/27/20 2248  AST 20  ALT 21  ALKPHOS 81  BILITOT 1.6*  PROT 6.9  ALBUMIN 4.1   Recent Labs  Lab 06/27/20 2248  LIPASE 42   No results for input(s): AMMONIA in the last 168 hours. CBC: Recent Labs  Lab 06/27/20 2233 06/29/20 0447  WBC 13.4* 5.4  HGB 14.5 12.0*  HCT 45.6 38.0*  MCV 84.1 85.8  PLT 558* 440*    CBG: Recent Labs  Lab 06/28/20 1957 06/28/20 2351 06/29/20 0352 06/29/20 0740 06/29/20 1156  GLUCAP 158* 160* 130* 141* 146*       Signed:  Oswald Hillock MD.  [facility] 06/29/2020, 12:57 PM

## 2020-06-29 NOTE — Progress Notes (Signed)
Luke Rivera to be D/C'd Home per MD order.  Discussed with the patient and all questions fully answered.  VSS, Skin clean, dry and intact without evidence of skin break down, no evidence of skin tears noted. IV catheter discontinued intact. Site without signs and symptoms of complications. Dressing and pressure applied.  An After Visit Summary was printed and given to the patient. D/c education completed with patient/family including follow up instructions, medication list, d/c activities limitations if indicated, with other d/c instructions as indicated by MD - patient able to verbalize understanding, all questions fully answered.   Patient instructed to return to ED, call 911, or call MD for any changes in condition.   Patient escorted via [place], and D/C home via private auto.  Luke Rivera 06/29/2020 1:40 PM

## 2020-06-29 NOTE — Plan of Care (Signed)
New admit within the last 24 hours

## 2020-10-01 ENCOUNTER — Other Ambulatory Visit: Payer: Self-pay | Admitting: Physical Medicine & Rehabilitation

## 2020-10-20 ENCOUNTER — Telehealth: Payer: Self-pay

## 2020-10-20 NOTE — Telephone Encounter (Signed)
Patients wife called for a refill on Tizanidine for aniexty. I called wife back to clarify that Tizanidine is what patient needs. She states yes he is still having muscle spasms. I left her know that a prescription was sent in on 10/02/2020 with additional refill. She will contact pharmacy.

## 2020-12-15 ENCOUNTER — Encounter: Payer: Self-pay | Admitting: Physical Therapy

## 2021-04-10 ENCOUNTER — Other Ambulatory Visit: Payer: Self-pay | Admitting: Physical Medicine & Rehabilitation

## 2021-05-28 ENCOUNTER — Encounter (HOSPITAL_BASED_OUTPATIENT_CLINIC_OR_DEPARTMENT_OTHER): Payer: Self-pay | Admitting: *Deleted

## 2021-05-28 ENCOUNTER — Inpatient Hospital Stay (HOSPITAL_BASED_OUTPATIENT_CLINIC_OR_DEPARTMENT_OTHER)
Admission: EM | Admit: 2021-05-28 | Discharge: 2021-05-31 | DRG: 177 | Disposition: A | Discharge status: Home / Self Care | Payer: Medicare HMO | Attending: Internal Medicine | Admitting: Internal Medicine

## 2021-05-28 ENCOUNTER — Other Ambulatory Visit: Payer: Self-pay

## 2021-05-28 DIAGNOSIS — Z794 Long term (current) use of insulin: Secondary | ICD-10-CM

## 2021-05-28 DIAGNOSIS — Z87891 Personal history of nicotine dependence: Secondary | ICD-10-CM

## 2021-05-28 DIAGNOSIS — Z7984 Long term (current) use of oral hypoglycemic drugs: Secondary | ICD-10-CM

## 2021-05-28 DIAGNOSIS — U071 COVID-19: Principal | ICD-10-CM | POA: Diagnosis present

## 2021-05-28 DIAGNOSIS — R0902 Hypoxemia: Secondary | ICD-10-CM

## 2021-05-28 DIAGNOSIS — R059 Cough, unspecified: Secondary | ICD-10-CM | POA: Diagnosis not present

## 2021-05-28 DIAGNOSIS — Z8249 Family history of ischemic heart disease and other diseases of the circulatory system: Secondary | ICD-10-CM

## 2021-05-28 DIAGNOSIS — I2510 Atherosclerotic heart disease of native coronary artery without angina pectoris: Secondary | ICD-10-CM | POA: Diagnosis present

## 2021-05-28 DIAGNOSIS — E119 Type 2 diabetes mellitus without complications: Secondary | ICD-10-CM | POA: Diagnosis present

## 2021-05-28 DIAGNOSIS — I69351 Hemiplegia and hemiparesis following cerebral infarction affecting right dominant side: Secondary | ICD-10-CM

## 2021-05-28 DIAGNOSIS — E860 Dehydration: Secondary | ICD-10-CM | POA: Diagnosis present

## 2021-05-28 DIAGNOSIS — I69320 Aphasia following cerebral infarction: Secondary | ICD-10-CM

## 2021-05-28 DIAGNOSIS — J069 Acute upper respiratory infection, unspecified: Secondary | ICD-10-CM | POA: Diagnosis present

## 2021-05-28 DIAGNOSIS — Z7901 Long term (current) use of anticoagulants: Secondary | ICD-10-CM

## 2021-05-28 DIAGNOSIS — I5032 Chronic diastolic (congestive) heart failure: Secondary | ICD-10-CM | POA: Diagnosis present

## 2021-05-28 DIAGNOSIS — J9601 Acute respiratory failure with hypoxia: Secondary | ICD-10-CM | POA: Diagnosis present

## 2021-05-28 DIAGNOSIS — I110 Hypertensive heart disease with heart failure: Secondary | ICD-10-CM | POA: Diagnosis present

## 2021-05-28 DIAGNOSIS — K219 Gastro-esophageal reflux disease without esophagitis: Secondary | ICD-10-CM | POA: Diagnosis present

## 2021-05-28 DIAGNOSIS — E785 Hyperlipidemia, unspecified: Secondary | ICD-10-CM | POA: Diagnosis present

## 2021-05-28 DIAGNOSIS — J1282 Pneumonia due to coronavirus disease 2019: Secondary | ICD-10-CM | POA: Diagnosis present

## 2021-05-28 DIAGNOSIS — Z79899 Other long term (current) drug therapy: Secondary | ICD-10-CM

## 2021-05-28 LAB — COMPREHENSIVE METABOLIC PANEL
ALT: 16 U/L (ref 0–44)
AST: 20 U/L (ref 15–41)
Albumin: 3.8 g/dL (ref 3.5–5.0)
Alkaline Phosphatase: 95 U/L (ref 38–126)
Anion gap: 7 (ref 5–15)
BUN: 18 mg/dL (ref 8–23)
CO2: 25 mmol/L (ref 22–32)
Calcium: 8.3 mg/dL — ABNORMAL LOW (ref 8.9–10.3)
Chloride: 105 mmol/L (ref 98–111)
Creatinine, Ser: 0.82 mg/dL (ref 0.61–1.24)
GFR, Estimated: >60 mL/min (ref >60)
Glucose, Bld: 180 mg/dL — ABNORMAL HIGH (ref 70–99)
Potassium: 4 mmol/L (ref 3.5–5.1)
Sodium: 137 mmol/L (ref 135–145)
Total Bilirubin: 1.1 mg/dL (ref 0.3–1.2)
Total Protein: 6.6 g/dL (ref 6.5–8.1)

## 2021-05-28 LAB — CBC WITH DIFFERENTIAL/PLATELET
Abs Immature Granulocytes: 0.03 10*3/uL (ref 0.00–0.07)
Basophils Absolute: 0.1 10*3/uL (ref 0.0–0.1)
Basophils Relative: 2 %
Eosinophils Absolute: 0.1 10*3/uL (ref 0.0–0.5)
Eosinophils Relative: 2 %
HCT: 32.9 % — ABNORMAL LOW (ref 39.0–52.0)
Hemoglobin: 10.6 g/dL — ABNORMAL LOW (ref 13.0–17.0)
Immature Granulocytes: 1 %
Lymphocytes Relative: 12 %
Lymphs Abs: 0.6 10*3/uL — ABNORMAL LOW (ref 0.7–4.0)
MCH: 26.9 pg (ref 26.0–34.0)
MCHC: 32.2 g/dL (ref 30.0–36.0)
MCV: 83.5 fL (ref 80.0–100.0)
Monocytes Absolute: 0.3 10*3/uL (ref 0.1–1.0)
Monocytes Relative: 5 %
Neutro Abs: 4.3 10*3/uL (ref 1.7–7.7)
Neutrophils Relative %: 78 %
Platelets: 460 10*3/uL — ABNORMAL HIGH (ref 150–400)
RBC: 3.94 MIL/uL — ABNORMAL LOW (ref 4.22–5.81)
RDW: 18.5 % — ABNORMAL HIGH (ref 11.5–15.5)
WBC: 5.4 10*3/uL (ref 4.0–10.5)
nRBC: 0 % (ref 0.0–0.2)

## 2021-05-28 LAB — LACTIC ACID, PLASMA: Lactic Acid, Venous: 1.6 mmol/L (ref 0.5–1.9)

## 2021-05-28 LAB — TROPONIN I (HIGH SENSITIVITY): Troponin I (High Sensitivity): 5 ng/L (ref <18)

## 2021-05-28 MED ORDER — SODIUM CHLORIDE 0.9 % IV SOLN
100.0000 mg | Freq: Every day | INTRAVENOUS | Status: AC
  Administered 2021-05-28 – 2021-05-31 (×4): 100 mg via INTRAVENOUS
  Filled 2021-05-28: qty 20
  Filled 2021-05-28: qty 100
  Filled 2021-05-28: qty 20

## 2021-05-28 MED ORDER — SODIUM CHLORIDE 0.9 % IV SOLN
100.0000 mg | INTRAVENOUS | Status: AC
  Administered 2021-05-28: 100 mg via INTRAVENOUS

## 2021-05-28 NOTE — ED Notes (Signed)
Pt is refusing remdesvir, requested to speak to MD

## 2021-05-28 NOTE — ED Triage Notes (Signed)
Covid + with SOB and pro cough x 3 days sent here by PMD for ? Left lobe Pneumonia  and low o2 sat in office.

## 2021-05-28 NOTE — ED Notes (Signed)
Per RN Ranette pt can have something to eat. Pt is given frozen dinner of chicken, stuffing and potatoes. And Coke.

## 2021-05-28 NOTE — ED Notes (Signed)
Wife updated that ED provider plans to admit pt for observation

## 2021-05-28 NOTE — ED Provider Notes (Signed)
[facility] DEPARTMENT Provider Note   CSN: [id number] Arrival date & time: 05/28/21  1558     History Chief Complaint  Patient presents with   Covid Positive    Luke Rivera is a 80 y.o. male.  HPI     80 year old male with a history of coronary artery disease, congestive heart failure, diabetes, DVT, CVA with right-sided weakness and aphasia  3 days of cough, congestion Not feeling short of breath Feel weak, fatigue, like can barely get up to go to bed. Napping a lot. Severe fatigue.  No chest pain Was a heavy smoker for many years but stopped more than 40 years ago No hx of known OSA No n/v/d  Past Medical History:  Diagnosis Date   CAD (coronary artery disease)    CHF (congestive heart failure) (HCC)    Chronic anticoagulation    DM2 (diabetes mellitus, type 2) (HCC)    DVT (deep venous thrombosis) (HCC)    Unprovoked, chronic eliquis now   GERD (gastroesophageal reflux disease)    HTN (hypertension)    Stroke ([place])    with R sided weakness and mild aphasia chronically.   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[place]) 06/28/2020   Chronic diastolic CHF (congestive heart failure) ([place]) 06/28/2020   Dysphagia, post-stroke    Overweight (BMI 25.0-29.9)    Acute blood loss anemia    Hypokalemia    Diabetic peripheral neuropathy (HCC)    Right knee pain    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 No pertinent surgical history.     Family History  Problem Relation Age of Onset   Hypertension Father    Heart disease Mother    Heart attack Brother     Social History   Tobacco Use   Smoking status: Former    Pack years: 0.00   Smokeless tobacco: Never  Vaping Use   Vaping Use: Never used  Substance Use Topics   Alcohol use: Never    Alcohol/week: 0.0 standard drinks   Drug use: Never    Home Medications Prior to Admission medications   Medication Sig Start Date End Date Taking? Authorizing Provider  apixaban (ELIQUIS) 5 MG TABS tablet Take 1 tablet (5 mg total) by mouth 2 (two) times daily. 06/07/19  Yes Meccariello, Bernita Raisin, DO  atorvastatin (LIPITOR) 80 MG tablet Take 40 mg by mouth at bedtime.    Yes [provider]  Cholecalciferol (VITAMIN D3) 50 MCG (2000 UT) TABS Take 4,000 Units by mouth daily.   Yes [provider]  citalopram (CELEXA) 10 MG tablet Take 1 tablet (10 mg total) by mouth daily. Patient taking differently: Take 5 mg by mouth daily. 07/08/19  Yes Kirsteins, Mitzi Hansen  E, MD  clopidogrel (PLAVIX) 75 MG tablet Take 1 tablet (75 mg total) by mouth daily. 06/29/19  Yes Love, Ivan Anchors, PA-C  CYANOCOBALAMIN PO Take 5,000 Units by mouth every Sunday.   Yes [provider]  metFORMIN (GLUCOPHAGE) 500 MG tablet Take 500 mg by mouth 2 (two) times daily with a meal.   Yes [provider]  pantoprazole (PROTONIX) 40 MG tablet Take 40 mg by mouth daily.   Yes [provider]  potassium chloride SA (KLOR-CON) 20 MEQ tablet Take 20 mEq by mouth daily.   Yes [provider]  tiZANidine (ZANAFLEX) 4 MG tablet TAKE 1 TABLET (4 MG TOTAL) BY MOUTH AT BEDTIME. 10/02/20  Yes Kirsteins, Luanna Salk, MD  acetaminophen (TYLENOL) 325 MG tablet Take 1-2 tablets (325-650  mg total) by mouth every 4 (four) hours as needed for mild pain. 06/15/19   Love, Ivan Anchors, PA-C  diclofenac sodium (VOLTAREN) 1 % GEL Apply 2 g topically 4 (four) times daily. Patient taking differently: Apply 2 g topically 4 (four) times daily as needed (Shoulder pain). 06/29/19   Love, Ivan Anchors, PA-C  insulin glargine (LANTUS) 100 UNIT/ML injection Inject 0.22 mLs (22 Units total) into the skin at bedtime. Patient taking differently: Inject 10 Units into the skin at bedtime. 07/02/19   Love, Ivan Anchors, PA-C  potassium chloride (KLOR-CON) 10 MEQ tablet Take 10 mEq by mouth daily.    [provider]    Allergies    Patient has no known allergies.  Review of Systems   Review of Systems  Constitutional:  Positive for fatigue. Negative for fever.  HENT:  Negative for sore throat.   Eyes:  Negative for visual disturbance.  Respiratory:  Positive for cough. Negative for shortness of breath.   Cardiovascular:  Negative for chest pain.  Gastrointestinal:  Negative for abdominal pain, nausea and vomiting.  Genitourinary:  Negative for difficulty urinating.  Musculoskeletal:  Negative for back pain and neck stiffness.  Skin:  Negative for rash.  Neurological:  Negative for syncope and headaches.   Physical Exam Updated Vital Signs BP (!) 152/69   Pulse 62   Temp 98.1 F (36.7 C) (Oral)   Resp 16   Ht 6\' 3"  (1.905 m)   Wt 103.9 kg   SpO2 95%   BMI 28.62 kg/m   Physical Exam Vitals and nursing note reviewed.  Constitutional:      General: He is not in acute distress.    Appearance: He is well-developed. He is not diaphoretic.  HENT:     Head: Normocephalic and atraumatic.  Eyes:     Conjunctiva/sclera: Conjunctivae normal.  Cardiovascular:     Rate and Rhythm: Normal rate and regular rhythm.     Heart sounds: Normal heart sounds. No murmur heard.   No friction rub. No gallop.  Pulmonary:     Effort: Pulmonary effort is normal. No respiratory distress.     Breath sounds:  Normal breath sounds. No wheezing or rales.  Abdominal:     General: There is no distension.     Palpations: Abdomen is soft.     Tenderness: There is no abdominal tenderness. There is no guarding.  Musculoskeletal:     Cervical back: Normal range of motion.  Skin:    General: Skin is warm and dry.  Neurological:     Mental Status: He is alert and oriented to person, place, and time.    ED Results / Procedures / Treatments   Labs (  all labs ordered are listed, but only abnormal results are displayed) Labs Reviewed  CBC WITH DIFFERENTIAL/PLATELET - Abnormal; Notable for the following components:      Result Value   RBC 3.94 (*)    Hemoglobin 10.6 (*)    HCT 32.9 (*)    RDW 18.5 (*)    Platelets 460 (*)    Lymphs Abs 0.6 (*)    All other components within normal limits  COMPREHENSIVE METABOLIC PANEL - Abnormal; Notable for the following components:   Glucose, Bld 180 (*)    Calcium 8.3 (*)    All other components within normal limits  CBG MONITORING, ED - Abnormal; Notable for the following components:   Glucose-Capillary 134 (*)    All other components within normal limits  CULTURE, BLOOD (ROUTINE X 2)  CULTURE, BLOOD (ROUTINE X 2)  LACTIC ACID, PLASMA  TROPONIN I (HIGH SENSITIVITY)    EKG EKG Interpretation  Date/Time:  Monday May 28 2021 16:56:07 EDT Ventricular Rate:  66 PR Interval:  299 QRS Duration: 128 QT Interval:  436 QTC Calculation: 457 R Axis:   -15 Text Interpretation: Sinus rhythm Prolonged PR interval IVCD, consider atypical RBBB No significant change since last tracing Confirmed by Gareth Morgan [phone]) on 05/28/2021 5:30:40 PM  Radiology No results found.  Procedures Procedures   Medications Ordered in ED Medications  remdesivir 100 mg in sodium chloride 0.9 % 100 mL IVPB ( Intravenous Stopped 05/28/21 2056)  remdesivir 100 mg in sodium chloride 0.9 % 100 mL IVPB (0 mg Intravenous Stopped 05/28/21 2053)  insulin glargine (LANTUS) injection  10 Units (has no administration in time range)  apixaban (ELIQUIS) tablet 5 mg (has no administration in time range)  atorvastatin (LIPITOR) tablet 40 mg (has no administration in time range)  clopidogrel (PLAVIX) tablet 75 mg (has no administration in time range)  dexamethasone (DECADRON) injection 10 mg (has no administration in time range)  insulin aspart (novoLOG) injection 0-15 Units (has no administration in time range)  insulin aspart (novoLOG) injection 4 Units (has no administration in time range)    ED Course  I have reviewed the triage vital signs and the nursing notes.  Pertinent labs & imaging results that were available during my care of the patient were reviewed by me and considered in my medical decision making (see chart for details).    MDM Rules/Calculators/A&P                          80yo male with history of CHF, CAD, DM, DVT on eliquis, hypertension, CVA with right sided weakness and aphasia, who presents with concern for cough and fatigue, sent from urgent care because of low oxygen saturations and COVID-positive.  Chest x-ray radiology read reviewed from the urgent care showing a possible left-sided midlung infiltrate and bronchitic change.    Labs show no significant abnormalities.  Initially discussed possible outpatient therapy as saturations were around 92%.  He is not a candidate for proximal lipid given medications, and discussed initiating remdesivir for outpatient therapy and observing longer in the ED given borderline saturation.  Oxygen saturations decreased down to 89% during observation period.  He improved again to above 90-91% without O2 and is begin observed at this time without O2.  Ordered some home medications, SSI.    Final Clinical Impression(s) / ED Diagnoses Final diagnoses:  QASTM-19  Hypoxia    Rx / DC Orders ED Discharge Orders     None  Gareth Morgan, MD 05/29/21 [phone]

## 2021-05-28 NOTE — ED Notes (Signed)
Wife called for update.  Spouse  Is adamant she does not want him to have any meds for covid r/t to his past hx of cva and on blood thinners.  MD provider aware and given pt's wife's phone number

## 2021-05-28 NOTE — Procedures (Signed)
RT walked patient down the hallway attached to portable pulse ox. Patient desat to 72 briefly at the end of the walk. Patient O2 came back up to 96 in his room before RT left patient.

## 2021-05-29 ENCOUNTER — Encounter (HOSPITAL_COMMUNITY): Payer: Self-pay | Admitting: Family Medicine

## 2021-05-29 ENCOUNTER — Emergency Department (HOSPITAL_BASED_OUTPATIENT_CLINIC_OR_DEPARTMENT_OTHER): Payer: Medicare HMO

## 2021-05-29 ENCOUNTER — Other Ambulatory Visit: Payer: Self-pay

## 2021-05-29 DIAGNOSIS — E860 Dehydration: Secondary | ICD-10-CM | POA: Diagnosis present

## 2021-05-29 DIAGNOSIS — J069 Acute upper respiratory infection, unspecified: Secondary | ICD-10-CM

## 2021-05-29 DIAGNOSIS — I5032 Chronic diastolic (congestive) heart failure: Secondary | ICD-10-CM | POA: Diagnosis present

## 2021-05-29 DIAGNOSIS — J9601 Acute respiratory failure with hypoxia: Secondary | ICD-10-CM | POA: Diagnosis present

## 2021-05-29 DIAGNOSIS — R059 Cough, unspecified: Secondary | ICD-10-CM | POA: Diagnosis present

## 2021-05-29 DIAGNOSIS — E119 Type 2 diabetes mellitus without complications: Secondary | ICD-10-CM | POA: Diagnosis present

## 2021-05-29 DIAGNOSIS — U071 COVID-19: Secondary | ICD-10-CM | POA: Diagnosis present

## 2021-05-29 DIAGNOSIS — Z87891 Personal history of nicotine dependence: Secondary | ICD-10-CM | POA: Diagnosis not present

## 2021-05-29 DIAGNOSIS — R0902 Hypoxemia: Secondary | ICD-10-CM

## 2021-05-29 DIAGNOSIS — Z79899 Other long term (current) drug therapy: Secondary | ICD-10-CM | POA: Diagnosis not present

## 2021-05-29 DIAGNOSIS — I69351 Hemiplegia and hemiparesis following cerebral infarction affecting right dominant side: Secondary | ICD-10-CM | POA: Diagnosis not present

## 2021-05-29 DIAGNOSIS — I69320 Aphasia following cerebral infarction: Secondary | ICD-10-CM | POA: Diagnosis not present

## 2021-05-29 DIAGNOSIS — K219 Gastro-esophageal reflux disease without esophagitis: Secondary | ICD-10-CM | POA: Diagnosis present

## 2021-05-29 DIAGNOSIS — Z794 Long term (current) use of insulin: Secondary | ICD-10-CM | POA: Diagnosis not present

## 2021-05-29 DIAGNOSIS — E785 Hyperlipidemia, unspecified: Secondary | ICD-10-CM | POA: Diagnosis present

## 2021-05-29 DIAGNOSIS — I2510 Atherosclerotic heart disease of native coronary artery without angina pectoris: Secondary | ICD-10-CM | POA: Diagnosis present

## 2021-05-29 DIAGNOSIS — Z7901 Long term (current) use of anticoagulants: Secondary | ICD-10-CM | POA: Diagnosis not present

## 2021-05-29 DIAGNOSIS — J1282 Pneumonia due to coronavirus disease 2019: Secondary | ICD-10-CM | POA: Diagnosis present

## 2021-05-29 DIAGNOSIS — Z8249 Family history of ischemic heart disease and other diseases of the circulatory system: Secondary | ICD-10-CM | POA: Diagnosis not present

## 2021-05-29 DIAGNOSIS — Z7984 Long term (current) use of oral hypoglycemic drugs: Secondary | ICD-10-CM | POA: Diagnosis not present

## 2021-05-29 DIAGNOSIS — I110 Hypertensive heart disease with heart failure: Secondary | ICD-10-CM | POA: Diagnosis present

## 2021-05-29 LAB — CBC
HCT: 33.3 % — ABNORMAL LOW (ref 39.0–52.0)
Hemoglobin: 11.1 g/dL — ABNORMAL LOW (ref 13.0–17.0)
MCH: 27.5 pg (ref 26.0–34.0)
MCHC: 33.3 g/dL (ref 30.0–36.0)
MCV: 82.4 fL (ref 80.0–100.0)
Platelets: 468 10*3/uL — ABNORMAL HIGH (ref 150–400)
RBC: 4.04 MIL/uL — ABNORMAL LOW (ref 4.22–5.81)
RDW: 18 % — ABNORMAL HIGH (ref 11.5–15.5)
WBC: 4.3 10*3/uL (ref 4.0–10.5)
nRBC: 0 % (ref 0.0–0.2)

## 2021-05-29 LAB — BASIC METABOLIC PANEL
Anion gap: 10 (ref 5–15)
BUN: 14 mg/dL (ref 8–23)
CO2: 25 mmol/L (ref 22–32)
Calcium: 8.4 mg/dL — ABNORMAL LOW (ref 8.9–10.3)
Chloride: 103 mmol/L (ref 98–111)
Creatinine, Ser: 0.79 mg/dL (ref 0.61–1.24)
GFR, Estimated: >60 mL/min (ref >60)
Glucose, Bld: 176 mg/dL — ABNORMAL HIGH (ref 70–99)
Potassium: 3.7 mmol/L (ref 3.5–5.1)
Sodium: 138 mmol/L (ref 135–145)

## 2021-05-29 LAB — HEMOGLOBIN A1C
Hgb A1c MFr Bld: 7.2 % — ABNORMAL HIGH (ref 4.8–5.6)
Mean Plasma Glucose: 159.94 mg/dL

## 2021-05-29 LAB — D-DIMER, QUANTITATIVE: D-Dimer, Quant: 2.47 ug/mL-FEU — ABNORMAL HIGH (ref 0.00–0.50)

## 2021-05-29 LAB — BRAIN NATRIURETIC PEPTIDE: B Natriuretic Peptide: 131.1 pg/mL — ABNORMAL HIGH (ref 0.0–100.0)

## 2021-05-29 LAB — C-REACTIVE PROTEIN: CRP: 4.5 mg/dL — ABNORMAL HIGH (ref <1.0)

## 2021-05-29 LAB — PROCALCITONIN: Procalcitonin: <0.1 ng/mL

## 2021-05-29 LAB — GLUCOSE, CAPILLARY
Glucose-Capillary: 179 mg/dL — ABNORMAL HIGH (ref 70–99)
Glucose-Capillary: 210 mg/dL — ABNORMAL HIGH (ref 70–99)
Glucose-Capillary: 167 mg/dL — ABNORMAL HIGH (ref 70–99)

## 2021-05-29 LAB — MAGNESIUM: Magnesium: 1.4 mg/dL — ABNORMAL LOW (ref 1.7–2.4)

## 2021-05-29 LAB — CBG MONITORING, ED
Glucose-Capillary: 134 mg/dL — ABNORMAL HIGH (ref 70–99)
Glucose-Capillary: 191 mg/dL — ABNORMAL HIGH (ref 70–99)
Glucose-Capillary: 195 mg/dL — ABNORMAL HIGH (ref 70–99)

## 2021-05-29 LAB — TSH: TSH: 0.391 u[IU]/mL (ref 0.350–4.500)

## 2021-05-29 LAB — MRSA NEXT GEN BY PCR, NASAL: MRSA by PCR Next Gen: NOT DETECTED

## 2021-05-29 MED ORDER — METHYLPREDNISOLONE SODIUM SUCC 125 MG IJ SOLR: 50.0000 mg | Freq: Two times a day (BID) | INTRAMUSCULAR | Status: DC

## 2021-05-29 MED ORDER — PREDNISONE 5 MG PO TABS: 50.0000 mg | ORAL_TABLET | Freq: Every day | ORAL | Status: DC

## 2021-05-29 MED ORDER — ALBUTEROL SULFATE HFA 108 (90 BASE) MCG/ACT IN AERS
2.0000 | INHALATION_SPRAY | Freq: Four times a day (QID) | RESPIRATORY_TRACT | Status: DC | PRN
  Filled 2021-05-29: qty 6.7

## 2021-05-29 MED ORDER — BISACODYL 5 MG PO TBEC: 5.0000 mg | DELAYED_RELEASE_TABLET | Freq: Every day | ORAL | Status: DC | PRN

## 2021-05-29 MED ORDER — CITALOPRAM HYDROBROMIDE 20 MG PO TABS
10.0000 mg | ORAL_TABLET | Freq: Every day | ORAL | Status: DC
  Administered 2021-05-29 – 2021-05-31 (×3): 10 mg via ORAL
  Filled 2021-05-29 (×3): qty 1

## 2021-05-29 MED ORDER — DEXAMETHASONE SODIUM PHOSPHATE 10 MG/ML IJ SOLN
10.0000 mg | Freq: Once | INTRAMUSCULAR | Status: AC
  Administered 2021-05-29: 10 mg via INTRAVENOUS
  Filled 2021-05-29: qty 1

## 2021-05-29 MED ORDER — ATORVASTATIN CALCIUM 40 MG PO TABS
40.0000 mg | ORAL_TABLET | Freq: Every day | ORAL | Status: DC
  Administered 2021-05-29 – 2021-05-30 (×3): 40 mg via ORAL
  Filled 2021-05-29 (×3): qty 1

## 2021-05-29 MED ORDER — TIMOLOL MALEATE 0.5 % OP SOLN
1.0000 [drp] | Freq: Every day | OPHTHALMIC | Status: DC
  Administered 2021-05-30 – 2021-05-31 (×2): 1 [drp] via OPHTHALMIC
  Filled 2021-05-29: qty 5

## 2021-05-29 MED ORDER — INSULIN GLARGINE 100 UNIT/ML ~~LOC~~ SOLN
10.0000 [IU] | Freq: Every day | SUBCUTANEOUS | Status: DC
  Filled 2021-05-29: qty 0.1

## 2021-05-29 MED ORDER — ACETAMINOPHEN 325 MG PO TABS: 650.0000 mg | ORAL_TABLET | Freq: Four times a day (QID) | ORAL | Status: DC | PRN

## 2021-05-29 MED ORDER — DEXAMETHASONE SODIUM PHOSPHATE 10 MG/ML IJ SOLN
8.0000 mg | Freq: Once | INTRAMUSCULAR | Status: AC
  Administered 2021-05-29: 8 mg via INTRAVENOUS
  Filled 2021-05-29: qty 1

## 2021-05-29 MED ORDER — TIZANIDINE HCL 4 MG PO TABS
4.0000 mg | ORAL_TABLET | Freq: Every day | ORAL | Status: DC
  Administered 2021-05-29 – 2021-05-30 (×2): 4 mg via ORAL
  Filled 2021-05-29 (×2): qty 1

## 2021-05-29 MED ORDER — VITAMIN D 25 MCG (1000 UNIT) PO TABS
4000.0000 [IU] | ORAL_TABLET | Freq: Every day | ORAL | Status: DC
  Administered 2021-05-29 – 2021-05-31 (×3): 4000 [IU] via ORAL
  Filled 2021-05-29 (×3): qty 4

## 2021-05-29 MED ORDER — CLOPIDOGREL BISULFATE 75 MG PO TABS
75.0000 mg | ORAL_TABLET | Freq: Every day | ORAL | Status: DC
  Administered 2021-05-29 – 2021-05-31 (×3): 75 mg via ORAL
  Filled 2021-05-29 (×3): qty 1

## 2021-05-29 MED ORDER — COLLAGENASE 250 UNIT/GM EX OINT
TOPICAL_OINTMENT | Freq: Every day | CUTANEOUS | Status: DC
  Filled 2021-05-29: qty 30

## 2021-05-29 MED ORDER — INSULIN ASPART 100 UNIT/ML IJ SOLN
0.0000 [IU] | Freq: Three times a day (TID) | INTRAMUSCULAR | Status: DC
  Administered 2021-05-29: 3 [IU] via SUBCUTANEOUS
  Administered 2021-05-29: 5 [IU] via SUBCUTANEOUS
  Administered 2021-05-30: 11 [IU] via SUBCUTANEOUS
  Administered 2021-05-30: 3 [IU] via SUBCUTANEOUS
  Administered 2021-05-30: 15 [IU] via SUBCUTANEOUS
  Administered 2021-05-31: 5 [IU] via SUBCUTANEOUS

## 2021-05-29 MED ORDER — INSULIN ASPART 100 UNIT/ML IJ SOLN
4.0000 [IU] | Freq: Three times a day (TID) | INTRAMUSCULAR | Status: DC
  Administered 2021-05-29 – 2021-05-31 (×8): 4 [IU] via SUBCUTANEOUS

## 2021-05-29 MED ORDER — INSULIN GLARGINE 100 UNIT/ML ~~LOC~~ SOLN
20.0000 [IU] | Freq: Every day | SUBCUTANEOUS | Status: DC
  Administered 2021-05-29 – 2021-05-30 (×2): 20 [IU] via SUBCUTANEOUS
  Filled 2021-05-29 (×3): qty 0.2

## 2021-05-29 MED ORDER — VITAMIN B-12 1000 MCG PO TABS: 500.0000 ug | ORAL_TABLET | ORAL | Status: DC

## 2021-05-29 MED ORDER — PANTOPRAZOLE SODIUM 40 MG PO TBEC
40.0000 mg | DELAYED_RELEASE_TABLET | Freq: Every day | ORAL | Status: DC
  Administered 2021-05-29 – 2021-05-31 (×3): 40 mg via ORAL
  Filled 2021-05-29 (×3): qty 1

## 2021-05-29 MED ORDER — APIXABAN 5 MG PO TABS
5.0000 mg | ORAL_TABLET | Freq: Two times a day (BID) | ORAL | Status: DC
  Administered 2021-05-29 – 2021-05-31 (×6): 5 mg via ORAL
  Filled 2021-05-29 (×2): qty 1
  Filled 2021-05-29: qty 2
  Filled 2021-05-29 (×3): qty 1

## 2021-05-29 MED ORDER — LATANOPROST 0.005 % OP SOLN
1.0000 [drp] | Freq: Every day | OPHTHALMIC | Status: DC
  Administered 2021-05-29 – 2021-05-30 (×2): 1 [drp] via OPHTHALMIC
  Filled 2021-05-29: qty 2.5

## 2021-05-29 MED ORDER — LACTATED RINGERS IV SOLN: INTRAVENOUS | Status: DC

## 2021-05-29 NOTE — ED Notes (Signed)
Placed patient on 3L [place] due to decrease in oxygen saturation to 86%. Patient recovered to 94% with 3L [place]. Patient tolerating well.

## 2021-05-29 NOTE — ED Notes (Signed)
Pt continues to rest quietly.

## 2021-05-29 NOTE — ED Notes (Signed)
Pt's SpO2 dropped to 86% while sleeping. Pt placed on 3L via N/C.

## 2021-05-29 NOTE — Plan of Care (Signed)
Care plan initiated.

## 2021-05-29 NOTE — ED Notes (Signed)
When meeting patient and speaking with him about plan of care pt reports he is not interested in being admitted to the hospital stating that he "feels fine" explained his new oxygen requirements and made [facility] ED MD aware of patients wish to not come into hospital at this time. MD at bedside speaking with patient about plan of care.

## 2021-05-29 NOTE — ED Notes (Signed)
Checked CBG 134, RN Adam informed

## 2021-05-29 NOTE — ED Notes (Signed)
Per [facility] MD give patient 4 units Novolog at this time

## 2021-05-29 NOTE — ED Notes (Signed)
Pt now agreeing to stay after speaking with MD pt wife updated on phone of patient bed assignment will notify wife once patient leaves [facility]

## 2021-05-29 NOTE — Progress Notes (Signed)
Patient arrived to the unit via transport. He is a&o x4. Respirations even and unlabored he is on 2L O2. VSS. He denies any pain or discomfort. He is able to make needs known. Education provided to call light, bed buttons and unit. Patient belongings gone through with patient. He has a wallet and a bracelet that he will send home with his wife. Notified MD that patient on unit and requested orders. Will continue to monitor.

## 2021-05-29 NOTE — H&P (Addendum)
TRH H&P   Patient Demographics:    Luke Rivera, is a 80 y.o. male  MRN: [id number]   DOB - 1941-08-09  Admit Date - 05/28/2021  Outpatient Primary MD for the patient is Kristopher Glee., MD    Patient coming from: [facility]  Chief Complaint  Patient presents with   Covid Positive      HPI:    Luke Rivera  is a 80 y.o. male, with history of left MCA infarct, DM type II, essential hypertension, DVT on Eliquis, CAD, chronic diastolic CHF recent EF 63%, presented to [facility] after experiencing a dry cough with exertional shortness of breath for 2 to 3 days, he went to his PCP office and was found to have low oxygen levels, he was subsequently sent to [facility] where he was diagnosed with COVID-19 pneumonia and acute hypoxic respiratory failure.  He was excepted as an admission by one of my partners yesterday.  Currently besides having a dry cough and some exertional shortness of breath has no symptoms, he denies any headache, no chest or abdominal pain, no focal weakness which is new, he does feel slightly weak all over.  No other positive review of systems.    Review of systems:    A full 10 point Review of Systems was done, except as stated above, all other Review of Systems were negative.   With Past History of the following :    Past Medical History:  Diagnosis Date   CAD (coronary artery disease)    CHF (congestive heart failure) (HCC)    Chronic anticoagulation    DM2 (diabetes mellitus, type 2) (HCC)    DVT (deep venous thrombosis) (HCC)    Unprovoked, chronic eliquis now   GERD (gastroesophageal reflux disease)    HTN (hypertension)    Stroke ([place])    with R sided weakness  and mild aphasia chronically.   TIA (transient ischemic attack)       History reviewed. No pertinent surgical history.    Social History:     Social History   Tobacco Use   Smoking status: Former    Pack years: 0.00   Smokeless tobacco: Never  Substance Use Topics   Alcohol use: Never    Alcohol/week: 0.0 standard drinks  Family History :     Family History  Problem Relation Age of Onset   Hypertension Father    Heart disease Mother    Heart attack Brother       Home Medications:   Prior to Admission medications   Medication Sig Start Date End Date Taking? Authorizing Provider  apixaban (ELIQUIS) 5 MG TABS tablet Take 1 tablet (5 mg total) by mouth 2 (two) times daily. 06/07/19  Yes Meccariello, Bernita Raisin, DO  atorvastatin (LIPITOR) 80 MG tablet Take 40 mg by mouth at bedtime.    Yes [provider]  Cholecalciferol (VITAMIN D3) 50 MCG (2000 UT) TABS Take 4,000 Units by mouth daily.   Yes [provider]  citalopram (CELEXA) 10 MG tablet Take 1 tablet (10 mg total) by mouth daily. Patient taking differently: Take 5 mg by mouth daily. 07/08/19  Yes Kirsteins, Luanna Salk, MD  clopidogrel (PLAVIX) 75 MG tablet Take 1 tablet (75 mg total) by mouth daily. 06/29/19  Yes Love, Ivan Anchors, PA-C  CYANOCOBALAMIN PO Take 5,000 Units by mouth every Sunday.   Yes [provider]  metFORMIN (GLUCOPHAGE) 500 MG tablet Take 500 mg by mouth 2 (two) times daily with a meal.   Yes [provider]  pantoprazole (PROTONIX) 40 MG tablet Take 40 mg by mouth daily.   Yes [provider]  potassium chloride SA (KLOR-CON) 20 MEQ tablet Take 20 mEq by mouth daily.   Yes [provider]  tiZANidine (ZANAFLEX) 4 MG tablet TAKE 1 TABLET (4 MG TOTAL) BY MOUTH AT BEDTIME. 10/02/20  Yes Kirsteins, Luanna Salk, MD  acetaminophen (TYLENOL) 325 MG tablet Take 1-2 tablets (325-650 mg total) by mouth every 4 (four) hours as needed for mild pain. 06/15/19    Love, Ivan Anchors, PA-C  diclofenac sodium (VOLTAREN) 1 % GEL Apply 2 g topically 4 (four) times daily. Patient taking differently: Apply 2 g topically 4 (four) times daily as needed (Shoulder pain). 06/29/19   Love, Ivan Anchors, PA-C  insulin glargine (LANTUS) 100 UNIT/ML injection Inject 0.22 mLs (22 Units total) into the skin at bedtime. Patient taking differently: Inject 10 Units into the skin at bedtime. 07/02/19   Love, Ivan Anchors, PA-C  potassium chloride (KLOR-CON) 10 MEQ tablet Take 10 mEq by mouth daily.    [provider]     Allergies:    Not on File   Physical Exam:   Vitals  Blood pressure 124/71, pulse 60, temperature 98.6 F (37 C), temperature source Oral, resp. rate 14, height 6\' 3"  (1.905 m), weight 103.9 kg, SpO2 96 %.   1. General -elderly white male lying in hospital bed in no apparent distress   2. Normal affect and insight, Not Suicidal or Homicidal, Awake Alert,   3. No F.N deficits, ALL C.Nerves Intact, Strength 5/5 all 4 extremities, Sensation intact all 4 extremities, Plantars down going.  4. Ears and Eyes appear Normal, Conjunctivae clear, PERRLA. Moist Oral Mucosa.  5. Supple Neck, No JVD, No cervical lymphadenopathy appriciated, No Carotid Bruits.  6. Symmetrical Chest wall movement, Good air movement bilaterally, CTAB.  7. RRR, No Gallops, Rubs or Murmurs, No Parasternal Heave.  8. Positive Bowel Sounds, Abdomen Soft, No tenderness, No organomegaly appriciated,No rebound -guarding or rigidity.  9.  No Cyanosis, Normal Skin Turgor, No Skin Rash or Bruise.  10. Good muscle tone,  joints appear normal , no effusions, Normal ROM.  11. No Palpable Lymph Nodes in Neck or Axillae  Data Review:    CBC Recent Labs  Lab 05/28/21 1724  WBC 5.4  HGB 10.6*  HCT 32.9*  PLT 460*  MCV 83.5  MCH 26.9  MCHC 32.2  RDW 18.5*  LYMPHSABS 0.6*  MONOABS 0.3  EOSABS 0.1  BASOSABS 0.1    ------------------------------------------------------------------------------------------------------------------  Chemistries  Recent Labs  Lab 05/28/21 1724  NA 137  K 4.0  CL 105  CO2 25  GLUCOSE 180*  BUN 18  CREATININE 0.82  CALCIUM 8.3*  AST 20  ALT 16  ALKPHOS 95  BILITOT 1.1   ------------------------------------------------------------------------------------------------------------------ estimated creatinine clearance is 90.7 mL/min (by C-G formula based on SCr of 0.82 mg/dL). ------------------------------------------------------------------------------------------------------------------ No results for input(s): TSH, T4TOTAL, T3FREE, THYROIDAB in the last 72 hours.  Invalid input(s): FREET3  Coagulation profile No results for input(s): INR, PROTIME in the last 168 hours. ------------------------------------------------------------------------------------------------------------------- No results for input(s): DDIMER in the last 72 hours. -------------------------------------------------------------------------------------------------------------------  Cardiac Enzymes No results for input(s): CKMB, TROPONINI, MYOGLOBIN in the last 168 hours.  Invalid input(s): CK ------------------------------------------------------------------------------------------------------------------ No results found for: BNP   ---------------------------------------------------------------------------------------------------------------  Urinalysis    Component Value Date/Time   COLORURINE YELLOW 06/28/2020 0319   APPEARANCEUR CLEAR 06/28/2020 0319   LABSPEC >1.046 (H) 06/28/2020 0319   PHURINE 5.0 06/28/2020 0319   GLUCOSEU NEGATIVE 06/28/2020 0319   HGBUR NEGATIVE 06/28/2020 0319   BILIRUBINUR NEGATIVE 06/28/2020 0319   KETONESUR NEGATIVE 06/28/2020 0319   PROTEINUR NEGATIVE 06/28/2020 0319   NITRITE NEGATIVE 06/28/2020 0319   LEUKOCYTESUR NEGATIVE 06/28/2020 0319     ----------------------------------------------------------------------------------------------------------------   Imaging Results:    DG Chest Portable 1 View  Result Date: 05/29/2021 CLINICAL DATA:  Initial evaluation for acute COVID pneumonia, cough. EXAM: PORTABLE CHEST 1 VIEW COMPARISON:  Prior radiograph from earlier the same day. FINDINGS: Transverse heart size at the upper limits of normal. Mediastinal silhouette within normal limits. Lungs normally inflated. Mild diffuse interstitial prominence, suspected to be related history of viral pneumonitis. No frank airspace consolidation. No edema or effusion. No pneumothorax. Visualized osseous structures demonstrate no acute finding. IMPRESSION: Mild diffuse interstitial prominence, likely related to history of acute viral pneumonitis. No frank airspace consolidation to suggest bronchopneumonia. Electronically Signed   By: Jeannine Boga M.D.   On: 05/29/2021 02:08    My personal review of EKG: Rhythm NSR, Rate  66 /min, possible intraventricular conduction delay similar to previous EKG.   Assessment & Plan:     1.  Acute hypoxic respiratory failure due to early COVID-19 pneumonia.  Patient is vaccinated, hypoxia seems to be mild, he has been started on steroids and Remdesivir which will be continued, will check and monitor inflammatory markers.  Encouraged to sit up in chair use I-S and flutter valve.  I think there is an element of atelectasis as well playing a role in his hypoxia.  2.  History of CVA and DVT.  Currently on Plavix, Eliquis and statin for secondary prevention which will be continued.  Will defer to primary cardiologist & PCP as to whether he requires both Plavix and Eliquis.  3.  Dyslipidemia.  Continue statin.  4.  GERD.  On PPI.  5.  Chronic diastolic CHF EF 32% on recent echo.  Currently compensated to slightly dehydrated.    6. DM type II.  Check A1c, continue on Lantus and add sliding scale.  Note patient  does not want to be on a carb modified or heart healthy diet.    DVT Prophylaxis Eliquis  AM Labs Ordered,  also please review Full Orders  Family Communication: Admission, patients condition and plan of care including tests being ordered have been discussed with the patient  who indicates understanding and agree with the plan and Code Status. Message left on the listed number for wife.  Code Status Full  Likely DC to  Home  Condition Fair  Consults called: None    Admission status: Inpt    Time spent in minutes : 35   Lala Lund M.D on 05/29/2021 at 11:37 AM  To page go to [email or web address] - password [email or web address]

## 2021-05-30 LAB — CBC WITH DIFFERENTIAL/PLATELET
Abs Immature Granulocytes: 0 10*3/uL (ref 0.00–0.07)
Basophils Absolute: 0.1 10*3/uL (ref 0.0–0.1)
Basophils Relative: 2 %
Eosinophils Absolute: 0 10*3/uL (ref 0.0–0.5)
Eosinophils Relative: 0 %
HCT: 30.4 % — ABNORMAL LOW (ref 39.0–52.0)
Hemoglobin: 10 g/dL — ABNORMAL LOW (ref 13.0–17.0)
Lymphocytes Relative: 4 %
Lymphs Abs: 0.2 10*3/uL — ABNORMAL LOW (ref 0.7–4.0)
MCH: 27.5 pg (ref 26.0–34.0)
MCHC: 32.9 g/dL (ref 30.0–36.0)
MCV: 83.7 fL (ref 80.0–100.0)
Monocytes Absolute: 0 10*3/uL — ABNORMAL LOW (ref 0.1–1.0)
Monocytes Relative: 1 %
Neutro Abs: 4.1 10*3/uL (ref 1.7–7.7)
Neutrophils Relative %: 93 %
Platelets: 459 10*3/uL — ABNORMAL HIGH (ref 150–400)
RBC: 3.63 MIL/uL — ABNORMAL LOW (ref 4.22–5.81)
RDW: 18.2 % — ABNORMAL HIGH (ref 11.5–15.5)
WBC: 4.4 10*3/uL (ref 4.0–10.5)
nRBC: 0.5 % — ABNORMAL HIGH (ref 0.0–0.2)
nRBC: 1 /100 WBC — ABNORMAL HIGH

## 2021-05-30 LAB — COMPREHENSIVE METABOLIC PANEL
ALT: 15 U/L (ref 0–44)
AST: 18 U/L (ref 15–41)
Albumin: 3.2 g/dL — ABNORMAL LOW (ref 3.5–5.0)
Alkaline Phosphatase: 80 U/L (ref 38–126)
Anion gap: 10 (ref 5–15)
BUN: 21 mg/dL (ref 8–23)
CO2: 24 mmol/L (ref 22–32)
Calcium: 8.4 mg/dL — ABNORMAL LOW (ref 8.9–10.3)
Chloride: 103 mmol/L (ref 98–111)
Creatinine, Ser: 0.85 mg/dL (ref 0.61–1.24)
GFR, Estimated: >60 mL/min (ref >60)
Glucose, Bld: 186 mg/dL — ABNORMAL HIGH (ref 70–99)
Potassium: 3.7 mmol/L (ref 3.5–5.1)
Sodium: 137 mmol/L (ref 135–145)
Total Bilirubin: 1.2 mg/dL (ref 0.3–1.2)
Total Protein: 5.8 g/dL — ABNORMAL LOW (ref 6.5–8.1)

## 2021-05-30 LAB — D-DIMER, QUANTITATIVE: D-Dimer, Quant: 3.06 ug/mL-FEU — ABNORMAL HIGH (ref 0.00–0.50)

## 2021-05-30 LAB — GLUCOSE, CAPILLARY
Glucose-Capillary: 187 mg/dL — ABNORMAL HIGH (ref 70–99)
Glucose-Capillary: 368 mg/dL — ABNORMAL HIGH (ref 70–99)
Glucose-Capillary: 369 mg/dL — ABNORMAL HIGH (ref 70–99)
Glucose-Capillary: 316 mg/dL — ABNORMAL HIGH (ref 70–99)
Glucose-Capillary: 223 mg/dL — ABNORMAL HIGH (ref 70–99)

## 2021-05-30 LAB — BRAIN NATRIURETIC PEPTIDE: B Natriuretic Peptide: 132.7 pg/mL — ABNORMAL HIGH (ref 0.0–100.0)

## 2021-05-30 LAB — C-REACTIVE PROTEIN: CRP: 3.4 mg/dL — ABNORMAL HIGH (ref <1.0)

## 2021-05-30 LAB — MAGNESIUM: Magnesium: 1.5 mg/dL — ABNORMAL LOW (ref 1.7–2.4)

## 2021-05-30 LAB — PROCALCITONIN: Procalcitonin: <0.1 ng/mL

## 2021-05-30 MED ORDER — MAGNESIUM SULFATE 4 GM/100ML IV SOLN
4.0000 g | Freq: Once | INTRAVENOUS | Status: AC
  Administered 2021-05-30: 4 g via INTRAVENOUS
  Filled 2021-05-30: qty 100

## 2021-05-30 MED ORDER — MAGNESIUM CHLORIDE 64 MG PO TBEC
2.0000 | DELAYED_RELEASE_TABLET | Freq: Once | ORAL | Status: AC
  Administered 2021-05-30: 128 mg via ORAL
  Filled 2021-05-30: qty 2

## 2021-05-30 MED ORDER — MAGNESIUM SULFATE IN D5W 1-5 GM/100ML-% IV SOLN
1.0000 g | Freq: Once | INTRAVENOUS | Status: DC
  Filled 2021-05-30: qty 100

## 2021-05-30 MED ORDER — DEXAMETHASONE SODIUM PHOSPHATE 10 MG/ML IJ SOLN
6.0000 mg | INTRAMUSCULAR | Status: DC
  Administered 2021-05-30 – 2021-05-31 (×2): 6 mg via INTRAVENOUS
  Filled 2021-05-30 (×2): qty 1

## 2021-05-30 NOTE — Progress Notes (Addendum)
PROGRESS NOTE                                                                                                                                                                                                             Patient Demographics:    Luke Rivera, is a 80 y.o. male, DOB - October 21, 1941, PPJ:[id number]  Outpatient Primary MD for the patient is Kristopher Glee., MD    LOS - 1  Admit date - 05/28/2021    Chief Complaint  Patient presents with   Covid Positive       Brief Narrative (HPI from H&P)   Luke Rivera  is a 80 y.o. male, with history of left MCA infarct, DM type II, essential hypertension, DVT on Eliquis, CAD, chronic diastolic CHF recent EF 58%, presented to [facility] after experiencing a dry cough with exertional shortness of breath for 2 to 3 days, he went to his PCP office and was found to have low oxygen levels, he was subsequently sent to [facility] where he was diagnosed with COVID-19 pneumonia and acute hypoxic respiratory failure.      Subjective:    Luke Rivera today has, No headache, No chest pain, No abdominal pain - No Nausea, No new weakness tingling or numbness, mild dry cough but no shortness of breath at rest.   Assessment  & Plan :     1.  Acute hypoxic respiratory failure due to early COVID-19 pneumonia.  Patient is vaccinated, hypoxia seems to be mild, he has been started on steroids and Remdesivir which will be continued, inflammatory markers improving, overall feels better.  Encouraged to sit up in chair use I-S and flutter valve.  I think there is an element of atelectasis as well playing a role in his hypoxia.  Will advance activity and try to titrate off oxygen today.  SpO2: 97 % O2 Flow Rate (L/min): 2 L/min  Recent Labs  Lab 05/28/21 1724 05/28/21 1726 05/29/21 1134 05/30/21 0149  WBC 5.4  --  4.3 4.4  HGB 10.6*  --  11.1* 10.0*  HCT 32.9*  --  33.3*  30.4*  PLT 460*  --  468* 459*  CRP  --   --  4.5* 3.4*  BNP  --   --  131.1* 132.7*  DDIMER  --   --  2.47* 3.06*  PROCALCITON  --   --  <0.10 <0.10  AST 20  --   --  18  ALT 16  --   --  15  ALKPHOS 95  --   --  80  BILITOT 1.1  --   --  1.2  ALBUMIN 3.8  --   --  3.2*  LATICACIDVEN  --  1.6  --   --       2.  History of CVA and DVT.  Currently on Plavix, Eliquis and statin for secondary prevention which will be continued.  Will defer to primary cardiologist & PCP as to whether he requires both Plavix and Eliquis.   3.  Dyslipidemia.  Continue statin.   4.  GERD.  On PPI.   5.  Chronic diastolic CHF EF 83% on recent echo.  Currently compensated to slightly dehydrated.     6. Hypomagnesemia - replaced.  7. DM type II.  Check A1c, continue on Lantus and add sliding scale.  Note patient does not want to be on a carb modified or heart healthy diet.   Lab Results  Component Value Date   HGBA1C 7.2 (H) 05/29/2021   CBG (last 3)  Recent Labs    05/29/21 1706 05/29/21 2042 05/30/21 0802  GLUCAP 210* 167* 187*     Obesity: Estimated body mass index is 28.62 kg/m as calculated from the following:   Height as of this encounter: 6\' 3"  (1.905 m).   Weight as of this encounter: 103.9 kg.          Condition - Fair  Family Communication  : Message left for wife [name] [phone] on 05/30/2021 at 8:57 AM  Code Status :  Full  Consults  :  None  PUD Prophylaxis : PPI   Procedures  :           Disposition Plan  :    Status is: Inpatient  Remains inpatient appropriate because:IV treatments appropriate due to intensity of illness or inability to take PO  Dispo: The patient is from: Home              Anticipated d/c is to: Home              Patient currently is not medically stable to d/c.   Difficult to place patient No   DVT Prophylaxis  :     apixaban (ELIQUIS) tablet 5 mg    Lab Results  Component Value Date   PLT 459 (H) 05/30/2021    Diet :   Diet Order             Diet regular Room service appropriate? Yes; Fluid consistency: Thin  Diet effective now                    Inpatient Medications  Scheduled Meds:  apixaban  5 mg Oral BID   atorvastatin  40 mg Oral QHS   cholecalciferol  4,000 Units Oral Daily   citalopram  10 mg Oral Daily   clopidogrel  75 mg Oral Daily   collagenase   Topical Daily   dexamethasone (DECADRON) injection  6 mg Intravenous Q24H   insulin aspart  0-15 Units Subcutaneous TID WC   insulin aspart  4 Units Subcutaneous TID WC   insulin glargine  20 Units Subcutaneous QHS   latanoprost  1 drop Right Eye QHS   magnesium chloride  2 tablet Oral Once   pantoprazole  40 mg  Oral Daily   timolol  1 drop Right Eye Daily   tiZANidine  4 mg Oral QHS   [START ON 06/03/2021] vitamin B-12  500 mcg Oral Q Sun   Continuous Infusions:  magnesium sulfate bolus IVPB     remdesivir 100 mg in NS 100 mL 100 mg (05/29/21 1238)   PRN Meds:.acetaminophen, albuterol, bisacodyl  Antibiotics  :    Anti-infectives (From admission, onward)    Start     Dose/Rate Route Frequency Ordered Stop   05/29/21 1000  remdesivir 100 mg in sodium chloride 0.9 % 100 mL IVPB        100 mg 200 mL/hr over 30 Minutes Intravenous Daily 05/28/21 1635 06/02/21 0959   05/28/21 1700  remdesivir 100 mg in sodium chloride 0.9 % 100 mL IVPB        100 mg 200 mL/hr over 30 Minutes Intravenous Every 30 min 05/28/21 1635 05/28/21 1759        Time Spent in minutes  30   Lala Lund M.D on 05/30/2021 at 8:57 AM  To page go to [email or web address]   [facility] -  Office  [phone]   See all Orders from today for further details    Objective:   Vitals:   05/29/21 1937 05/29/21 2339 05/30/21 0303 05/30/21 0800  BP: (!) 120/53 94/74 (!) 128/57 131/70  Pulse: 63 60 (!) 55 (!) 58  Resp: 16 15 15 13   Temp: 98.5 F (36.9 C) 97.9 F (36.6 C) 98 F (36.7 C) 97.7 F (36.5 C)  TempSrc: Oral Oral Oral Oral  SpO2: 99%  97% 95% 97%  Weight:      Height:        Wt Readings from Last 3 Encounters:  05/29/21 103.9 kg  06/29/20 111.2 kg  04/20/20 106.1 kg     Intake/Output Summary (Last 24 hours) at 05/30/2021 0857 Last data filed at 05/30/2021 0805 Gross per 24 hour  Intake --  Output 275 ml  Net -275 ml     Physical Exam  Awake Alert, No new F.N deficits, Normal affect [place].AT,PERRAL Supple Neck,No JVD, No cervical lymphadenopathy appriciated.  Symmetrical Chest wall movement, Good air movement bilaterally, CTAB RRR,No Gallops,Rubs or new Murmurs, No Parasternal Heave +ve B.Sounds, Abd Soft, No tenderness, No organomegaly appriciated, No rebound - guarding or rigidity. No Cyanosis, Clubbing or edema, No new Rash or bruise     RN pressure injury documentation:     Data Review:    CBC Recent Labs  Lab 05/28/21 1724 05/29/21 1134 05/30/21 0149  WBC 5.4 4.3 4.4  HGB 10.6* 11.1* 10.0*  HCT 32.9* 33.3* 30.4*  PLT 460* 468* 459*  MCV 83.5 82.4 83.7  MCH 26.9 27.5 27.5  MCHC 32.2 33.3 32.9  RDW 18.5* 18.0* 18.2*  LYMPHSABS 0.6*  --  0.2*  MONOABS 0.3  --  0.0*  EOSABS 0.1  --  0.0  BASOSABS 0.1  --  0.1    Recent Labs  Lab 05/28/21 1724 05/28/21 1726 05/29/21 1134 05/30/21 0149  NA 137  --  138 137  K 4.0  --  3.7 3.7  CL 105  --  103 103  CO2 25  --  25 24  GLUCOSE 180*  --  176* 186*  BUN 18  --  14 21  CREATININE 0.82  --  0.79 0.85  CALCIUM 8.3*  --  8.4* 8.4*  AST 20  --   --  18  ALT 16  --   --  15  ALKPHOS 95  --   --  80  BILITOT 1.1  --   --  1.2  ALBUMIN 3.8  --   --  3.2*  MG  --   --  1.4* 1.5*  CRP  --   --  4.5* 3.4*  DDIMER  --   --  2.47* 3.06*  PROCALCITON  --   --  <0.10 <0.10  LATICACIDVEN  --  1.6  --   --   TSH  --   --  0.391  --   HGBA1C  --   --  7.2*  --   BNP  --   --  131.1* 132.7*    ------------------------------------------------------------------------------------------------------------------ No results for input(s): CHOL, HDL,  LDLCALC, TRIG, CHOLHDL, LDLDIRECT in the last 72 hours.  Lab Results  Component Value Date   HGBA1C 7.2 (H) 05/29/2021   ------------------------------------------------------------------------------------------------------------------ Recent Labs    05/29/21 1134  TSH 0.391    Cardiac Enzymes No results for input(s): CKMB, TROPONINI, MYOGLOBIN in the last 168 hours.  Invalid input(s): CK ------------------------------------------------------------------------------------------------------------------    Component Value Date/Time   BNP 132.7 (H) 05/30/2021 0149    Micro Results Recent Results (from the past 240 hour(s))  Blood culture (routine x 2)     Status: None (Preliminary result)   Collection Time: 05/28/21  5:24 PM   Specimen: BLOOD RIGHT FOREARM  Result Value Ref Range Status   Specimen Description   Final    BLOOD RIGHT FOREARM Performed at [facility], [address] [place], [place] [address]    Special Requests   Final    BOTTLES DRAWN AEROBIC AND ANAEROBIC Blood Culture results may not be optimal due to an inadequate volume of blood received in culture bottles Performed at [facility], [address] [place], [place] [address]    Culture   Final    NO GROWTH 2 DAYS Performed at [facility], [place] [address] [place], [place] [address]    Report Status PENDING  Incomplete  Blood culture (routine x 2)     Status: None (Preliminary result)   Collection Time: 05/28/21  5:50 PM   Specimen: BLOOD  Result Value Ref Range Status   Specimen Description   Final    BLOOD LEFT ANTECUBITAL Performed at [facility], [address] [place], [place] [address]    Special Requests   Final    BOTTLES DRAWN AEROBIC ONLY Blood Culture results may not be optimal due to an inadequate volume of blood received in culture bottles Performed at [facility], [address] [place], [place] [address]    Culture   Final     NO GROWTH 2 DAYS Performed at [facility], [place] [address] [place], [place] [address]    Report Status PENDING  Incomplete  MRSA Next Gen by PCR, Nasal     Status: None   Collection Time: 05/29/21  3:21 PM   Specimen: Nasal Mucosa; Nasal Swab  Result Value Ref Range Status   MRSA by PCR Next Gen NOT DETECTED NOT DETECTED Final    Comment: (NOTE) The GeneXpert MRSA Assay (FDA approved for NASAL specimens only), is one component of a comprehensive MRSA colonization surveillance program. It is not intended to diagnose MRSA infection nor to guide or monitor treatment for MRSA infections. Test performance is not FDA approved in patients less than 72 years old. Performed at [facility], [place] [address] [place], [place] [address]     Radiology Reports DG Chest Portable 1 View  Result Date: 05/29/2021 CLINICAL DATA:  Initial evaluation for acute COVID pneumonia, cough. EXAM: PORTABLE CHEST 1 VIEW COMPARISON:  Prior radiograph from earlier the same day. FINDINGS: Transverse heart size at the upper limits of normal. Mediastinal silhouette within normal limits. Lungs normally inflated. Mild diffuse interstitial prominence, suspected to be related history of viral pneumonitis. No frank airspace consolidation. No edema or effusion. No pneumothorax. Visualized osseous structures demonstrate no acute finding. IMPRESSION: Mild diffuse interstitial prominence, likely related to history of acute viral pneumonitis. No frank airspace consolidation to suggest bronchopneumonia. Electronically Signed   By: Jeannine Boga M.D.   On: 05/29/2021 02:08

## 2021-05-30 NOTE — Evaluation (Signed)
Physical Therapy Evaluation Patient Details Name: Luke Rivera MRN: [id number] DOB: 1941-09-20 Today's Date: 05/30/2021   History of Present Illness  Pt is an 81 year old man admitted with 05/29/21 from [place] with SOB and cough due to COVID 19. PMH: L MCA CVA with residual R side weakness, DM 2, HTN, DVT, CAD, CHF (EF 60%).  Clinical Impression   Patient received in chair, very pleasant and cooperative with therapy. Able to mobilize on a supervision to min guard level with RW. Does show chronic R sided impairments from prior CVA, but functional. Would really benefit from updated AFO for R LE given impaired ankle dorsiflexion/gait impairments from past CVA. Unable to get accurate pleth when holding onto RW, but suspect he was able to maintain in the high 80s-low 90s with activity on room air. Able to maintain sats in the 90s on room air easily today at rest. Left up in recliner with all needs met, RN aware of patient status. Will benefit from skilled HHPT f/u.     Follow Up Recommendations Home health PT;Supervision for mobility/OOB    Equipment Recommendations  None recommended by PT    Recommendations for Other Services Other (comment) (consult with [facility] for new AFO)     Precautions / Restrictions Precautions Precautions: Fall Restrictions Weight Bearing Restrictions: No      Mobility  Bed Mobility               General bed mobility comments: received in chair    Transfers Overall transfer level: Needs assistance Equipment used: Rolling walker (2 wheeled) Transfers: Sit to/from Stand Sit to Stand: Supervision         General transfer comment: S for safety, occasional cues for hand placement  Ambulation/Gait Ambulation/Gait assistance: Min guard Gait Distance (Feet): 30 Feet (in room) Assistive device: Rolling walker (2 wheeled) Gait Pattern/deviations: Step-through pattern;Trunk flexed;Decreased dorsiflexion - right;Decreased weight shift to right Gait  velocity: decreased   General Gait Details: slow but steady with RW, does have moderate lack of dorsiflexion R ankle secondary to past CVA, but functional. Reports he has AFO but it has gotten torn up due to him walking around in it without a shoe  Stairs            Wheelchair Mobility    Modified Rankin (Stroke Patients Only)       Balance Overall balance assessment: Needs assistance   Sitting balance-Leahy Scale: Good       Standing balance-Leahy Scale: Fair Standing balance comment: can release walker in static standing to pull up underwear                             Pertinent Vitals/Pain Pain Assessment: No/denies pain    Home Living Family/patient expects to be discharged to:: Private residence Living Arrangements: Spouse/significant other Available Help at Discharge: Family;Available 24 hours/day Type of Home: House Home Access: Level entry     Home Layout: One level Home Equipment: Walker - 2 wheels;Shower seat;Grab bars - toilet;Grab bars - tub/shower;Hand held shower head;Wheelchair - manual      Prior Function Level of Independence: Independent with assistive device(s)         Comments: Uses RW and R AFO at baseline, wife does housekeeping and meal prep     Hand Dominance   Dominant Hand: Right (but now leads with L due to CVA)    Extremity/Trunk Assessment   Upper Extremity Assessment Upper  Extremity Assessment: Defer to OT evaluation RUE Deficits / Details: mild tremor, incoordination from previous CVA, does not use functionally during ADL, but able to hold onto walker RUE Coordination: decreased fine motor;decreased gross motor    Lower Extremity Assessment Lower Extremity Assessment: RLE deficits/detail;LLE deficits/detail RLE Deficits / Details: hip flexion 3+/5, quad 4-/5, ankle dorsiflexion 2+/5 RLE Sensation: decreased proprioception RLE Coordination: decreased gross motor LLE Deficits / Details: WNL LLE Sensation:  WNL LLE Coordination: WNL    Cervical / Trunk Assessment Cervical / Trunk Assessment: Kyphotic  Communication   Communication: Expressive difficulties  Cognition Arousal/Alertness: Awake/alert Behavior During Therapy: WFL for tasks assessed/performed Overall Cognitive Status: Within Functional Limits for tasks assessed                                        General Comments      Exercises     Assessment/Plan    PT Assessment Patient needs continued PT services  PT Problem List Decreased strength;Decreased balance;Decreased mobility;Decreased coordination       PT Treatment Interventions DME instruction;Balance training;Gait training;Stair training;Functional mobility training;Patient/family education;Therapeutic activities;Therapeutic exercise    PT Goals (Current goals can be found in the Care Plan section)  Acute Rehab PT Goals Patient Stated Goal: to get stronger PT Goal Formulation: With patient Time For Goal Achievement: 06/13/21 Potential to Achieve Goals: Good    Frequency Min 3X/week   Barriers to discharge        Co-evaluation               AM-PAC PT "6 Clicks" Mobility  Outcome Measure Help needed turning from your back to your side while in a flat bed without using bedrails?: A Little Help needed moving from lying on your back to sitting on the side of a flat bed without using bedrails?: A Little Help needed moving to and from a bed to a chair (including a wheelchair)?: A Little Help needed standing up from a chair using your arms (e.g., wheelchair or bedside chair)?: A Little Help needed to walk in hospital room?: A Little Help needed climbing 3-5 steps with a railing? : A Little 6 Click Score: 18    End of Session   Activity Tolerance: Patient tolerated treatment well Patient left: in chair;with call bell/phone within reach Nurse Communication: Mobility status;Other (comment) (SpO2 on room air) PT Visit Diagnosis:  Unsteadiness on feet (R26.81);Difficulty in walking, not elsewhere classified (R26.2);Muscle weakness (generalized) (M62.81);Other symptoms and signs involving the nervous system (R29.898)    Time: 6770-3403 PT Time Calculation (min) (ACUTE ONLY): 32 min   Charges:   PT Evaluation $PT Eval Moderate Complexity: 1 Mod PT Treatments $Gait Training: 8-22 mins       Windell Norfolk, DPT, PN1   Supplemental Physical Therapist Luke Rivera    Pager [phone] Acute Rehab Office [phone]

## 2021-05-30 NOTE — Evaluation (Signed)
Occupational Therapy Evaluation Patient Details Name: Luke Rivera MRN: [id number] DOB: 10-10-1941 Today's Date: 05/30/2021    History of Present Illness Pt is an 80 year old man admitted with 05/29/21 from [place] with SOB and cough due to COVID 19. PMH: L MCA CVA with residual R side weakness, DM 2, HTN, DVT, CAD, CHF (EF 60%).   Clinical Impression   Pt ambulates with a RW and is modified independent in ADL at his baseline. He lives with his supportive wife. Pt presents with generalized weakness, impaired standing balance, decreased activity tolerance and residual R side weakness. He requires up to min guard assist for ADL and mobility with increased time to perform. Will follow acutely, but do not anticipate need for follow up OT upon discharge. Pt is eager to go on vacation in 2 weeks with his family.     Follow Up Recommendations  No OT follow up    Equipment Recommendations  3 in 1 bedside commode    Recommendations for Other Services       Precautions / Restrictions Precautions Precautions: Fall      Mobility Bed Mobility               General bed mobility comments: received in chair    Transfers Overall transfer level: Needs assistance Equipment used: Rolling walker (2 wheeled) Transfers: Sit to/from Stand Sit to Stand: Min guard              Balance Overall balance assessment: Needs assistance   Sitting balance-Leahy Scale: Good       Standing balance-Leahy Scale: Fair Standing balance comment: can release walker in static standing to pull up underwear                           ADL either performed or assessed with clinical judgement   ADL Overall ADL's : Needs assistance/impaired Eating/Feeding: Modified independent;Sitting Eating/Feeding Details (indicate cue type and reason): opens containers with L hand and mouth Grooming: Wash/dry hands;Sitting;Set up   Upper Body Bathing: Set up;Sitting   Lower Body Bathing: Min  guard;Sit to/from stand   Upper Body Dressing : Set up;Sitting   Lower Body Dressing: Min guard;Sit to/from stand   Toilet Transfer: Min guard;Stand-pivot;RW;BSC   Toileting- Water quality scientist and Hygiene: Min guard;Sit to/from stand         General ADL Comments: Sp02 94% on RA     Vision Patient Visual Report: No change from baseline       Perception     Praxis      Pertinent Vitals/Pain Pain Assessment: No/denies pain     Hand Dominance Right (but now leads with L due to CVA)   Extremity/Trunk Assessment Upper Extremity Assessment Upper Extremity Assessment: RUE deficits/detail RUE Deficits / Details: mild tremor, incoordination from previous CVA, does not use functionally during ADL, but able to hold onto walker RUE Coordination: decreased fine motor;decreased gross motor   Lower Extremity Assessment Lower Extremity Assessment: Defer to PT evaluation   Cervical / Trunk Assessment Cervical / Trunk Assessment: Kyphotic   Communication Communication Communication: Expressive difficulties   Cognition Arousal/Alertness: Awake/alert Behavior During Therapy: WFL for tasks assessed/performed Overall Cognitive Status: Within Functional Limits for tasks assessed                                     General Comments  Exercises     Shoulder Instructions      Home Living Family/patient expects to be discharged to:: Private residence Living Arrangements: Spouse/significant other Available Help at Discharge: Family;Available 24 hours/day Type of Home: House Home Access: Level entry     Home Layout: One level     Bathroom Shower/Tub: Occupational psychologist: Handicapped height     Home Equipment: Environmental consultant - 2 wheels;Shower seat;Grab bars - toilet;Grab bars - tub/shower;Hand held shower head;Wheelchair - manual          Prior Functioning/Environment Level of Independence: Independent with assistive device(s)         Comments: Uses RW and R AFO at baseline, wife does housekeeping and meal prep        OT Problem List: Decreased strength;Decreased activity tolerance;Impaired balance (sitting and/or standing);Decreased knowledge of use of DME or AE      OT Treatment/Interventions: Self-care/ADL training;Patient/family education;Balance training;Cognitive remediation/compensation;DME and/or AE instruction;Energy conservation    OT Goals(Current goals can be found in the care plan section) Acute Rehab OT Goals Patient Stated Goal: to get stronger OT Goal Formulation: With patient Time For Goal Achievement: 06/13/21 Potential to Achieve Goals: Good ADL Goals Pt Will Perform Grooming: with supervision;standing Pt Will Perform Lower Body Bathing: with supervision;sit to/from stand Pt Will Perform Lower Body Dressing: with supervision;sit to/from stand Pt Will Transfer to Toilet: with supervision;ambulating;bedside commode (over toilet) Pt Will Perform Toileting - Clothing Manipulation and hygiene: with supervision;sit to/from stand Pt Will Perform Tub/Shower Transfer: Shower transfer;with min guard assist;ambulating;rolling walker Additional ADL Goal #1: Pt will generalize energy conservation strategies during ADL and mobility.  OT Frequency: Min 2X/week   Barriers to D/C:            Co-evaluation              AM-PAC OT "6 Clicks" Daily Activity     Outcome Measure Help from another person eating meals?: None Help from another person taking care of personal grooming?: A Little Help from another person toileting, which includes using toliet, bedpan, or urinal?: A Little Help from another person bathing (including washing, rinsing, drying)?: A Little Help from another person to put on and taking off regular upper body clothing?: None Help from another person to put on and taking off regular lower body clothing?: A Little 6 Click Score: 20   End of Session Equipment Utilized During  Treatment: Rolling walker;Gait belt  Activity Tolerance: Patient tolerated treatment well Patient left: in chair;with call bell/phone within reach;with chair alarm set;with family/visitor present  OT Visit Diagnosis: Unsteadiness on feet (R26.81);Other abnormalities of gait and mobility (R26.89);Muscle weakness (generalized) (M62.81);Hemiplegia and hemiparesis Hemiplegia - Right/Left: Right Hemiplegia - dominant/non-dominant: Dominant Hemiplegia - caused by: Cerebral infarction                Time: 1249-1318 OT Time Calculation (min): 29 min Charges:  OT General Charges $OT Visit: 1 Visit OT Evaluation $OT Eval Low Complexity: 1 Low OT Treatments $Self Care/Home Management : 8-22 mins  Nestor Lewandowsky, OTR/L Acute Rehabilitation Services Pager: [phone] Office: [phone]   Malka So 05/30/2021, 2:44 PM

## 2021-05-31 DIAGNOSIS — R001 Bradycardia, unspecified: Secondary | ICD-10-CM

## 2021-05-31 LAB — CBC WITH DIFFERENTIAL/PLATELET
Abs Immature Granulocytes: 0.05 10*3/uL (ref 0.00–0.07)
Basophils Absolute: 0 10*3/uL (ref 0.0–0.1)
Basophils Relative: 1 %
Eosinophils Absolute: 0 10*3/uL (ref 0.0–0.5)
Eosinophils Relative: 0 %
HCT: 30 % — ABNORMAL LOW (ref 39.0–52.0)
Hemoglobin: 9.9 g/dL — ABNORMAL LOW (ref 13.0–17.0)
Immature Granulocytes: 1 %
Lymphocytes Relative: 13 %
Lymphs Abs: 0.6 10*3/uL — ABNORMAL LOW (ref 0.7–4.0)
MCH: 27.5 pg (ref 26.0–34.0)
MCHC: 33 g/dL (ref 30.0–36.0)
MCV: 83.3 fL (ref 80.0–100.0)
Monocytes Absolute: 0.2 10*3/uL (ref 0.1–1.0)
Monocytes Relative: 5 %
Neutro Abs: 3.6 10*3/uL (ref 1.7–7.7)
Neutrophils Relative %: 80 %
Platelets: 471 10*3/uL — ABNORMAL HIGH (ref 150–400)
RBC: 3.6 MIL/uL — ABNORMAL LOW (ref 4.22–5.81)
RDW: 18.2 % — ABNORMAL HIGH (ref 11.5–15.5)
WBC: 4.5 10*3/uL (ref 4.0–10.5)
nRBC: 0 % (ref 0.0–0.2)

## 2021-05-31 LAB — GLUCOSE, CAPILLARY
Glucose-Capillary: 113 mg/dL — ABNORMAL HIGH (ref 70–99)
Glucose-Capillary: 221 mg/dL — ABNORMAL HIGH (ref 70–99)

## 2021-05-31 LAB — COMPREHENSIVE METABOLIC PANEL
ALT: 15 U/L (ref 0–44)
AST: 16 U/L (ref 15–41)
Albumin: 3.1 g/dL — ABNORMAL LOW (ref 3.5–5.0)
Alkaline Phosphatase: 79 U/L (ref 38–126)
Anion gap: 6 (ref 5–15)
BUN: 19 mg/dL (ref 8–23)
CO2: 25 mmol/L (ref 22–32)
Calcium: 8.4 mg/dL — ABNORMAL LOW (ref 8.9–10.3)
Chloride: 106 mmol/L (ref 98–111)
Creatinine, Ser: 0.86 mg/dL (ref 0.61–1.24)
GFR, Estimated: >60 mL/min (ref >60)
Glucose, Bld: 146 mg/dL — ABNORMAL HIGH (ref 70–99)
Potassium: 3.8 mmol/L (ref 3.5–5.1)
Sodium: 137 mmol/L (ref 135–145)
Total Bilirubin: 0.7 mg/dL (ref 0.3–1.2)
Total Protein: 5.6 g/dL — ABNORMAL LOW (ref 6.5–8.1)

## 2021-05-31 LAB — PROCALCITONIN: Procalcitonin: <0.1 ng/mL

## 2021-05-31 LAB — C-REACTIVE PROTEIN: CRP: 1.6 mg/dL — ABNORMAL HIGH (ref <1.0)

## 2021-05-31 LAB — MAGNESIUM: Magnesium: 2.1 mg/dL (ref 1.7–2.4)

## 2021-05-31 LAB — BRAIN NATRIURETIC PEPTIDE: B Natriuretic Peptide: 270.1 pg/mL — ABNORMAL HIGH (ref 0.0–100.0)

## 2021-05-31 LAB — D-DIMER, QUANTITATIVE: D-Dimer, Quant: 2.94 ug/mL-FEU — ABNORMAL HIGH (ref 0.00–0.50)

## 2021-05-31 MED ORDER — POLYETHYLENE GLYCOL 3350 17 G PO PACK
17.0000 g | PACK | Freq: Two times a day (BID) | ORAL | Status: DC
  Administered 2021-05-31: 17 g via ORAL
  Filled 2021-05-31: qty 1

## 2021-05-31 NOTE — Discharge Instructions (Addendum)
Follow with Primary MD Kristopher Glee., MD in 7 days   Get CBC, CMP, TSH, 2 view Chest X ray -  checked next visit within 1 week by Primary MD, also request your primary care physician to schedule a sleep study.  You must follow-up with your cardiologist within a week as well.  Activity: As tolerated with Full fall precautions use walker/cane & assistance as needed  Disposition Home    Diet: Heart Healthy  Low Carb, check CBGs QA CHS.  Special Instructions: If you have smoked or chewed Tobacco  in the last 2 yrs please stop smoking, stop any regular Alcohol  and or any Recreational drug use.  On your next visit with your primary care physician please Get Medicines reviewed and adjusted.  Please request your Prim.MD to go over all Hospital Tests and Procedure/Radiological results at the follow up, please get all Hospital records sent to your Prim MD by signing hospital release before you go home.  If you experience worsening of your admission symptoms, develop shortness of breath, life threatening emergency, suicidal or homicidal thoughts you must seek medical attention immediately by calling 911 or calling your MD immediately  if symptoms less severe.  You Must read complete instructions/literature along with all the possible adverse reactions/side effects for all the Medicines you take and that have been prescribed to you. Take any new Medicines after you have completely understood and accpet all the possible adverse reactions/side effects.

## 2021-05-31 NOTE — Discharge Summary (Signed)
Luke Rivera NLG:[id number] DOB: 07/02/1941 DOA: 05/28/2021  PCP: Kristopher Glee., MD  Admit date: 05/28/2021  Discharge date: 05/31/2021  Admitted From: Home   Disposition:  Home   Recommendations for Outpatient Follow-up:   Follow up with PCP in 1-2 weeks  PCP Please obtain BMP/CBC, 2 view CXR in 1week,  (see Discharge instructions)   PCP Please follow up on the following pending results: Check CBC, CMP, TSH, 2 view chest x-ray in 7 to 10 days.  Needs outpatient sleep study and follow-up with his primary cardiologist within a week.   Home Health: PT, RN Equipment/Devices: Home o2 if qualifies on ambulation Consultations: None  Discharge Condition: Stable    CODE STATUS: Full    Diet Recommendation: Heart Healthy Low Carb    Chief Complaint  Patient presents with   Covid Positive     Brief history of present illness from the day of admission and additional interim summary    Luke Rivera  is a 80 y.o. male, with history of left MCA infarct, DM type II, essential hypertension, DVT on Eliquis, CAD, chronic diastolic CHF recent EF 08%, presented to [place] after experiencing a dry cough with exertional shortness of breath for 2 to 3 days, he went to his PCP office and was found to have low oxygen levels, he was subsequently sent to [place] [facility] where he was diagnosed with COVID-19 pneumonia and acute hypoxic respiratory failure.                                                                      Hospital Course   1.  Acute hypoxic respiratory failure due to early COVID-19 pneumonia.  Patient is vaccinated, he had mild early COVID-19 infection with possible early pneumonia and mild hypoxia, he received short course of steroids and Remdesivir, now symptom-free at rest on RA and close to  his baseline, will be discharged home with PCP follow-up.  Will ambulate him on room air and if he qualifies will get oxygen for ambulation.   2.  History of CVA and DVT.  Currently on Plavix, Eliquis and statin for secondary prevention which will be continued.  Will defer to primary cardiologist & PCP as to whether he requires both Plavix and Eliquis.   3.  Dyslipidemia.  Continue statin.   4.  GERD.  On PPI.   5.  Chronic diastolic CHF EF 14% on recent echo.  Was slightly dehydrated and now better after IV fluids.   6. Hypomagnesemia - replaced.   7.  Episode of sinus bradycardia while he was sleeping at night with stable blood pressure and no symptoms.  Heart rate dropped into high 40s at times, could have undiagnosed sleep apnea, also on Zanaflex which has  been stopped.  This morning upon waking up heart rate and blood pressure stable and he is symptom-free.  Will discontinue Zanaflex, request PCP to check TSH, outpatient sleep study and have him follow-up with his primary cardiologist within 5 to 7 days.  He tells me he already has an appointment with his cardiologist within a week.    8. DM type II.  Stable continue home regimen.  Lab Results  Component Value Date   HGBA1C 7.2 (H) 05/29/2021     Discharge diagnosis     Active Problems:   Acute respiratory disease due to COVID-19 virus   COVID-19 virus infection    Discharge instructions    Discharge Instructions     Discharge instructions   Complete by: As directed    Follow with Primary MD Kristopher Glee., MD in 7 days   Get CBC, CMP, TSH, 2 view Chest X ray -  checked next visit within 1 week by Primary MD, also request your primary care physician to schedule a sleep study.  You must follow-up with your cardiologist within a week as well.  Activity: As tolerated with Full fall precautions use walker/cane & assistance as needed  Disposition Home    Diet: Heart Healthy  Low Carb, check CBGs QA CHS  Special  Instructions: If you have smoked or chewed Tobacco  in the last 2 yrs please stop smoking, stop any regular Alcohol  and or any Recreational drug use.  On your next visit with your primary care physician please Get Medicines reviewed and adjusted.  Please request your Prim.MD to go over all Hospital Tests and Procedure/Radiological results at the follow up, please get all Hospital records sent to your Prim MD by signing hospital release before you go home.  If you experience worsening of your admission symptoms, develop shortness of breath, life threatening emergency, suicidal or homicidal thoughts you must seek medical attention immediately by calling 911 or calling your MD immediately  if symptoms less severe.  You Must read complete instructions/literature along with all the possible adverse reactions/side effects for all the Medicines you take and that have been prescribed to you. Take any new Medicines after you have completely understood and accpet all the possible adverse reactions/side effects.   Increase activity slowly   Complete by: As directed    MyChart COVID-19 home monitoring program   Complete by: May 31, 2021    Is the patient willing to use the [place] for home monitoring?: Yes   Temperature monitoring   Complete by: May 31, 2021    After how many days would you like to receive a notification of this patient's flowsheet entries?: 1       Discharge Medications   Allergies as of 05/31/2021   No Known Allergies      Medication List     STOP taking these medications    tiZANidine 4 MG tablet Commonly known as: ZANAFLEX       TAKE these medications    acetaminophen 325 MG tablet Commonly known as: TYLENOL Take 1-2 tablets (325-650 mg total) by mouth every 4 (four) hours as needed for mild pain.   apixaban 5 MG Tabs tablet Commonly known as: ELIQUIS Take 1 tablet (5 mg total) by mouth 2 (two) times daily.   atorvastatin 80 MG tablet Commonly  known as: LIPITOR Take 40 mg by mouth at bedtime.   citalopram 10 MG tablet Commonly known as: CELEXA Take 1 tablet (10 mg total) by  mouth daily. What changed: how much to take   clopidogrel 75 MG tablet Commonly known as: PLAVIX Take 1 tablet (75 mg total) by mouth daily.   CYANOCOBALAMIN PO Take 5,000 Units by mouth every Sunday.   diclofenac sodium 1 % Gel Commonly known as: VOLTAREN Apply 2 g topically 4 (four) times daily. What changed:  when to take this reasons to take this   insulin glargine 100 UNIT/ML injection Commonly known as: LANTUS Inject 0.22 mLs (22 Units total) into the skin at bedtime. What changed: how much to take   latanoprost 0.005 % ophthalmic solution Commonly known as: XALATAN Place 1 drop into the right eye at bedtime.   lidocaine 5 % ointment Commonly known as: XYLOCAINE Apply 1 application topically See admin instructions. Apply twice a day to painful area on toes   metFORMIN 500 MG tablet Commonly known as: GLUCOPHAGE Take 500 mg by mouth 2 (two) times daily with a meal.   MULTIVITAMIN ADULT PO Take 1 tablet by mouth daily.   pantoprazole 40 MG tablet Commonly known as: PROTONIX Take 40 mg by mouth daily.   potassium chloride 10 MEQ tablet Commonly known as: KLOR-CON Take 10 mEq by mouth daily.   potassium chloride SA 20 MEQ tablet Commonly known as: KLOR-CON Take 20 mEq by mouth daily.   PRESCRIPTION MEDICATION Apply 1 application topically 3 (three) times daily as needed for irritation.   sodium chloride irrigation 0.9 % irrigation Apply 15 mLs topically See admin instructions. Irrigate 93ml to the affected area daily to wounds and dry surrounding skin   timolol 0.5 % ophthalmic solution Commonly known as: BETIMOL Place 1 drop into the right eye daily.   VITAMIN D PO Take 1 tablet by mouth daily.   Vitamin D3 50 MCG (2000 UT) Tabs Take 4,000 Units by mouth daily.         Follow-up Information     Kristopher Glee., MD. Schedule an appointment as soon as possible for a visit in 1 week(s).   Specialty: Internal Medicine Why: Get a sleep study scheduled by your PCP Contact information: [address] [place] [address] [phone]                 Major procedures and Radiology Reports - PLEASE review detailed and final reports thoroughly  -       DG Chest Portable 1 View  Result Date: 05/29/2021 CLINICAL DATA:  Initial evaluation for acute COVID pneumonia, cough. EXAM: PORTABLE CHEST 1 VIEW COMPARISON:  Prior radiograph from earlier the same day. FINDINGS: Transverse heart size at the upper limits of normal. Mediastinal silhouette within normal limits. Lungs normally inflated. Mild diffuse interstitial prominence, suspected to be related history of viral pneumonitis. No frank airspace consolidation. No edema or effusion. No pneumothorax. Visualized osseous structures demonstrate no acute finding. IMPRESSION: Mild diffuse interstitial prominence, likely related to history of acute viral pneumonitis. No frank airspace consolidation to suggest bronchopneumonia. Electronically Signed   By: Jeannine Boga M.D.   On: 05/29/2021 02:08    Micro Results     Recent Results (from the past 240 hour(s))  Blood culture (routine x 2)     Status: None (Preliminary result)   Collection Time: 05/28/21  5:24 PM   Specimen: BLOOD RIGHT FOREARM  Result Value Ref Range Status   Specimen Description   Final    BLOOD RIGHT FOREARM Performed at [facility], [address] [place], [place]  [address]    Special Requests   Final    BOTTLES DRAWN AEROBIC AND ANAEROBIC Blood Culture results may not be optimal due to an inadequate volume of blood received in culture bottles Performed at [facility], [address] [place], [place] [address]    Culture   Final    NO GROWTH 2 DAYS Performed at [facility], [place] [address] [place], [place] [address]    Report  Status PENDING  Incomplete  Blood culture (routine x 2)     Status: None (Preliminary result)   Collection Time: 05/28/21  5:50 PM   Specimen: BLOOD  Result Value Ref Range Status   Specimen Description   Final    BLOOD LEFT ANTECUBITAL Performed at [facility], [address] [place], [place] [address]    Special Requests   Final    BOTTLES DRAWN AEROBIC ONLY Blood Culture results may not be optimal due to an inadequate volume of blood received in culture bottles Performed at [facility], [address] [place], [place] [address]    Culture   Final    NO GROWTH 2 DAYS Performed at [facility], [place] [address], [place], [place] [address]    Report Status PENDING  Incomplete  MRSA Next Gen by PCR, Nasal     Status: None   Collection Time: 05/29/21  3:21 PM   Specimen: Nasal Mucosa; Nasal Swab  Result Value Ref Range Status   MRSA by PCR Next Gen NOT DETECTED NOT DETECTED Final    Comment: (NOTE) The GeneXpert MRSA Assay (FDA approved for NASAL specimens only), is one component of a comprehensive MRSA colonization surveillance program. It is not intended to diagnose MRSA infection nor to guide or monitor treatment for MRSA infections. Test performance is not FDA approved in patients less than 69 years old. Performed at [facility], [place] [address] [place], [place] [address]     Today   Subjective    Luke Rivera today has no headache,no chest abdominal pain,no new weakness tingling or numbness, feels much better wants to go home today.    Objective   Blood pressure 114/60, pulse 55 temperature 97.6 F (36.4 C), temperature source Oral, resp. rate 16, height 6\' 3"  (1.905 m), weight 103.9 kg, SpO2 91 %.   Intake/Output Summary (Last 24 hours) at 05/31/2021 0906 Last data filed at 05/31/2021 0500 Gross per 24 hour  Intake --  Output 900 ml  Net -900 ml    Exam  Awake Alert, No new F.N deficits, Normal  affect [place].AT,PERRAL Supple Neck,No JVD, No cervical lymphadenopathy appriciated.  Symmetrical Chest wall movement, Good air movement bilaterally, CTAB RRR,No Gallops,Rubs or new Murmurs, No Parasternal Heave +ve B.Sounds, Abd Soft, Non tender, No organomegaly appriciated, No rebound -guarding or rigidity. No Cyanosis, Clubbing or edema, No new Rash or bruise   Data Review   CBC w Diff:  Lab Results  Component Value Date   WBC 4.5 05/31/2021   HGB 9.9 (L) 05/31/2021   HCT 30.0 (L) 05/31/2021   PLT 471 (H) 05/31/2021   LYMPHOPCT 13 05/31/2021   MONOPCT 5 05/31/2021   EOSPCT 0 05/31/2021   BASOPCT 1 05/31/2021    CMP:  Lab Results  Component Value Date   NA 137 05/31/2021   K 3.8 05/31/2021   CL 106 05/31/2021   CO2 25 05/31/2021   BUN 19 05/31/2021   CREATININE 0.86  05/31/2021   PROT 5.6 (L) 05/31/2021   ALBUMIN 3.1 (L) 05/31/2021   BILITOT 0.7 05/31/2021   ALKPHOS 79 05/31/2021   AST 16 05/31/2021   ALT 15 05/31/2021  .   Total Time in preparing paper work, data evaluation and todays exam - 29 minutes  Lala Lund M.D on 05/31/2021 at 9:06 AM  [facility]

## 2021-05-31 NOTE — Progress Notes (Signed)
Physical Therapy Treatment Patient Details Name: Luke Rivera MRN: [id number] DOB: September 13, 1941 Today's Date: 05/31/2021    History of Present Illness Pt is an 80 y.o. male admitted with 05/29/21 from [place] with SOB and cough. Workup for acute hypoxic respiratory failure due to early COVID-19 PNA. PMH includes L MCA CVA (residual R side weakness), DM 2, HTN, DVT, CAD, CHF (EF 60%).   PT Comments    Pt progressing well with mobility. Today's session focused on additional gait training, pt moving well with RW and supervision for safety. SpO2 >/94% on RA with activity (see saturations qualifications note). Pt and family educ re: activity recommendations, pulse ox use, importance of mobility. Pt preparing for d/c home this afternoon; recommend follow-up with HHPT services.    Follow Up Recommendations  Home health PT;Supervision for mobility/OOB     Equipment Recommendations  None recommended by PT    Recommendations for Other Services       Precautions / Restrictions Precautions Precautions: Fall;Other (comment) Precaution Comments: R foot drop (wears AFO at home) Restrictions Weight Bearing Restrictions: No    Mobility  Bed Mobility               General bed mobility comments: Received sitting in recliner    Transfers Overall transfer level: Needs assistance Equipment used: Standard walker Transfers: Sit to/from Stand Sit to Stand: Supervision         General transfer comment: Multiple sit<>stands from recliner to RW, supervision for safety  Ambulation/Gait Ambulation/Gait assistance: Supervision Gait Distance (Feet): 52 Feet (+36) Assistive device: Rolling walker (2 wheeled) Gait Pattern/deviations: Step-through pattern;Decreased stride length;Decreased dorsiflexion - right;Decreased weight shift to right;Trunk flexed Gait velocity: decreased   General Gait Details: Slow, steady gait with RW and supervision due to fall risk. Pt reports baseline forward  flexed posture secondary to back pain; also with R foot drop and RLE weakness, pt with good compensatory strategies allowing for functionality with ambulation   Stairs             Wheelchair Mobility    Modified Rankin (Stroke Patients Only)       Balance Overall balance assessment: Needs assistance   Sitting balance-Leahy Scale: Good       Standing balance-Leahy Scale: Fair Standing balance comment: Can static stand without UE support; static and dynamic stability improved with RW                            Cognition Arousal/Alertness: Awake/alert Behavior During Therapy: WFL for tasks assessed/performed Overall Cognitive Status: Within Functional Limits for tasks assessed                                        Exercises      General Comments General comments (skin integrity, edema, etc.): Pt's family present and supportive, asking multiple questions regarding O2 and DME needs. Pt's SpO2 >/94% on RA with activity, does not require O2 at home (RN and CM notified) much to pt's wife's relief. Family also had new portable pulse ox to try; educ pt and wife on it's use. CM aware pt needing BSC delivered prior to d/c. Pt and wife report no further questions or concerns before d/c this afternoon.      Pertinent Vitals/Pain Pain Assessment: No/denies pain    Home Living  Prior Function            PT Goals (current goals can now be found in the care plan section) Progress towards PT goals: Progressing toward goals    Frequency    Min 3X/week      PT Plan Current plan remains appropriate    Co-evaluation              AM-PAC PT "6 Clicks" Mobility   Outcome Measure  Help needed turning from your back to your side while in a flat bed without using bedrails?: None Help needed moving from lying on your back to sitting on the side of a flat bed without using bedrails?: None Help needed moving to and  from a bed to a chair (including a wheelchair)?: A Little Help needed standing up from a chair using your arms (e.g., wheelchair or bedside chair)?: A Little Help needed to walk in hospital room?: A Little Help needed climbing 3-5 steps with a railing? : A Little 6 Click Score: 20    End of Session   Activity Tolerance: Patient tolerated treatment well Patient left: in chair;with call bell/phone within reach;with family/visitor present Nurse Communication: Mobility status;Other (comment) (does not require supplemental O2) PT Visit Diagnosis: Unsteadiness on feet (R26.81);Difficulty in walking, not elsewhere classified (R26.2);Muscle weakness (generalized) (M62.81);Other symptoms and signs involving the nervous system (R29.898)     Time: 2263-3354 PT Time Calculation (min) (ACUTE ONLY): 25 min  Charges:  $Gait Training: 8-22 mins $Self Care/Home Management: [place], PT, DPT Acute Rehabilitation Services  Pager [phone] Office [place] 05/31/2021, 2:06 PM

## 2021-05-31 NOTE — Progress Notes (Signed)
TRH night shift.  The staff reports that the patient has been bradycardic while sleeping ranging as low as 29 bpm to the 40s.  He gets a single drop of timolol in the morning, but also is taking tizanidine at bedtime.  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  We will continue telemetry.  Likely a combination of factors, likely including vasovagal stimulation while sleeping.  I will hold tizanidine given its mild alpha 2-adrenergic effect similar to clonidine that could be decreasing his heart rate further.  Tennis Must, MD.

## 2021-05-31 NOTE — Progress Notes (Signed)
Patient had non sustained bradycardia (between 29-40s) last night while sleeping. He is asymptomatic and denies CP. Notified MD, for further observation.

## 2021-05-31 NOTE — Progress Notes (Signed)
Luke Rivera to be D/C'd Home per MD order.  Discussed with the patient and all questions fully answered.  VSS, Skin clean, dry and intact without evidence of skin break down, no evidence of skin tears noted. IV catheter discontinued intact. Site without signs and symptoms of complications. Dressing and pressure applied.  An After Visit Summary was printed and given to the patient. Patient received prescription.  D/c education completed with patient/family including follow up instructions, medication list, d/c activities limitations if indicated, with other d/c instructions as indicated by MD - patient able to verbalize understanding, all questions fully answered.   Patient instructed to return to ED, call 911, or call MD for any changes in condition.   Patient escorted via [place], and D/C home via private auto.  [name] 05/31/2021 3:12 PM

## 2021-05-31 NOTE — TOC Initial Note (Signed)
Transition of Care [facility]) - Initial/Assessment Note    Patient Details  Name: Luke Rivera MRN: [id number] Date of Birth: [birth year]  Transition of Care [facility]) CM/SW Contact:    Ninfa Meeker, RN Phone Number: 05/31/2021, 11:43 AM  Clinical Narrative:  Case Manager spoke with patient's wife, [name], concerning discharge needs. Choice for [facility] agencyb offered. She states [facility]. Case Manager received permission to arrange for Home Health services, referral called to [place], Liaison with [facility].    Expected Discharge Plan: [place] Barriers to Discharge: No Barriers Identified   Patient Goals and CMS Choice     Choice offered to / list presented to : Spouse  Expected Discharge Plan and Services Expected Discharge Plan: [place] In-house Referral: NA Discharge Planning Services: CM Consult   Living arrangements for the past 2 months: Single Family Home Expected Discharge Date: 05/31/21               DME Arranged: N/A DME Agency: NA       HH Arranged: RN, PT HH Agency: [place] Date HH Agency Contacted: 05/31/21 Time HH Agency Contacted: 45 Representative spoke with at [place]: [place]  Prior Living Arrangements/Services Living arrangements for the past 2 months: [place] with:: Spouse Patient language and need for interpreter reviewed:: Yes Do you feel safe going back to the place where you live?: Yes      Need for Family Participation in Patient Care: Yes (Comment) Care giver support system in place?: Yes (comment)   Criminal Activity/Legal Involvement Pertinent to Current Situation/Hospitalization: No - Comment as needed  Activities of Daily Living Home Assistive Devices/Equipment: Walker (specify type) ADL Screening (condition at time of admission) Patient's cognitive ability adequate to safely complete daily activities?: Yes Is the  patient deaf or have difficulty hearing?: No Does the patient have difficulty seeing, even when wearing glasses/contacts?: No Does the patient have difficulty concentrating, remembering, or making decisions?: No Patient able to express need for assistance with ADLs?: Yes Does the patient have difficulty dressing or bathing?: No Independently performs ADLs?: Yes (appropriate for developmental age) Does the patient have difficulty walking or climbing stairs?: Yes Weakness of Legs: Right Weakness of Arms/Hands: Right  Permission Sought/Granted                  Emotional Assessment         Alcohol / Substance Use: Not Applicable Psych Involvement: No (comment)  Admission diagnosis:  Hypoxia [R09.02] Acute respiratory disease due to COVID-19 virus [U07.1, J06.9] COVID-19 [U07.1] COVID-19 virus infection [U07.1] Patient Active Problem List   Diagnosis Date Noted   COVID-19 virus infection 05/29/2021   Acute respiratory disease due to COVID-19 virus 05/28/2021   Syncope 06/28/2020   Hemiparesis affecting right side as late effect of cerebrovascular accident (CVA) ([place]) 06/28/2020   Chronic anticoagulation 06/28/2020   Nausea vomiting and diarrhea 06/28/2020   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) 06/28/2020   Chronic diastolic CHF (congestive heart failure) ([place]) 06/28/2020   Dysphagia, post-stroke    Overweight (BMI 25.0-29.9)    Acute blood loss anemia    Hypokalemia    Diabetic peripheral neuropathy (HCC)    Right knee pain    Acute ischemic right MCA stroke ([place]) 06/07/2019   AMS (altered mental status)    Incidental pulmonary nodule, > 74mm and < 66mm 06/01/2019  CVA (cerebral vascular accident) ([place]) 05/31/2019   Fibroma of foot 02/20/2015   Gout of foot 01/11/2015   Onychomycosis 01/11/2015   Pain in lower limb 01/11/2015   Hammer toe of right foot 01/02/2015   Pain in right foot 01/02/2015   PCP:  Kristopher Glee., MD Pharmacy:   [id number] [place], [address] [place] [address] Phone: [phone] Fax: [phone]  [facility] [place] [place] [facility] [address] [employer] [address] [place] [address] Phone: [phone] Fax: [phone]     Social Determinants of Health (SDOH) Interventions    Readmission Risk Interventions No flowsheet data found.

## 2021-05-31 NOTE — TOC Transition Note (Addendum)
Transition of Care [facility]) - CM/SW Discharge Note   Patient Details  Name: Luke Rivera MRN: [id number] Date of Birth: 02/28/1941  Transition of Care [facility]) CM/SW Contact:  Ninfa Meeker, RN Phone Number: 05/31/2021, 12:39 PM   Clinical Narrative:   Case manager has arranged for oxygen for home. [employer] will deliver tank for transport home to patient's room.   1:15pm: Per therapist patient doesn't need home oxygen based on sats. CM canceled oxygen request with [employer].   Final next level of care: [place] Barriers to Discharge: No Barriers Identified   Patient Goals and CMS Choice     Choice offered to / list presented to : Spouse  Discharge Placement                       Discharge Plan and Services In-house Referral: NA Discharge Planning Services: CM Consult            DME Arranged: Oxygen, 3in1 DME Agency: [employer] Date DME Agency Contacted: 05/31/21 Time DME Agency Contacted: 1238 Representative spoke with at DME Agency: Freda Munro HH Arranged: RN, PT [facility] Agency: [place] Date [place]: 05/31/21 Time [place]: [place] spoke with at [place]: [place] Determinants of Health ([place]) Interventions     Readmission Risk Interventions No flowsheet data found.

## 2021-05-31 NOTE — Progress Notes (Signed)
SATURATION QUALIFICATIONS: (This note is used to comply with regulatory documentation for home oxygen)  Patient Saturations on Room Air at Rest = 95%  Patient Saturations on Room Air while Ambulating = 80%  Patient Saturations on 4 Liters of oxygen while Ambulating = 92%  Please briefly explain why patient needs home oxygen: patient needs at home oxygen because he was not able to maintain a saturation above 88% without oxygen.

## 2021-05-31 NOTE — Progress Notes (Signed)
SATURATION QUALIFICATIONS: (This note is used to comply with regulatory documentation for home oxygen)  Patient Saturations on Room Air at Rest = 96%  Patient Saturations on Room Air while Ambulating = 94%  Patient Saturations on -- Liters of oxygen while Ambulating = N/A  Please briefly explain why patient needs home oxygen: Patient does not require supplemental oxygen at rest or with activity. Case Manager and RN notified. Pt and wife aware.  Mabeline Caras, PT, DPT Acute Rehabilitation Services  Pager [phone] Office [phone]

## 2021-06-02 LAB — CULTURE, BLOOD (ROUTINE X 2)
Culture: NO GROWTH
Culture: NO GROWTH

## 2022-01-17 ENCOUNTER — Other Ambulatory Visit: Payer: Self-pay

## 2022-01-17 ENCOUNTER — Encounter (HOSPITAL_BASED_OUTPATIENT_CLINIC_OR_DEPARTMENT_OTHER): Payer: Self-pay

## 2022-01-17 ENCOUNTER — Emergency Department (HOSPITAL_BASED_OUTPATIENT_CLINIC_OR_DEPARTMENT_OTHER)
Admission: EM | Admit: 2022-01-17 | Discharge: 2022-01-17 | Disposition: A | Discharge status: Home / Self Care | Payer: Medicare HMO | Attending: Emergency Medicine | Admitting: Emergency Medicine

## 2022-01-17 DIAGNOSIS — Z7984 Long term (current) use of oral hypoglycemic drugs: Secondary | ICD-10-CM | POA: Insufficient documentation

## 2022-01-17 DIAGNOSIS — Z794 Long term (current) use of insulin: Secondary | ICD-10-CM | POA: Insufficient documentation

## 2022-01-17 DIAGNOSIS — D649 Anemia, unspecified: Secondary | ICD-10-CM | POA: Diagnosis not present

## 2022-01-17 DIAGNOSIS — Z7902 Long term (current) use of antithrombotics/antiplatelets: Secondary | ICD-10-CM | POA: Insufficient documentation

## 2022-01-17 DIAGNOSIS — Z7901 Long term (current) use of anticoagulants: Secondary | ICD-10-CM | POA: Diagnosis not present

## 2022-01-17 DIAGNOSIS — R799 Abnormal finding of blood chemistry, unspecified: Secondary | ICD-10-CM | POA: Diagnosis present

## 2022-01-17 LAB — CBC WITH DIFFERENTIAL/PLATELET
Abs Immature Granulocytes: 0.94 10*3/uL — ABNORMAL HIGH (ref 0.00–0.07)
Basophils Absolute: 0.2 10*3/uL — ABNORMAL HIGH (ref 0.0–0.1)
Basophils Relative: 4 %
Eosinophils Absolute: 0.1 10*3/uL (ref 0.0–0.5)
Eosinophils Relative: 2 %
HCT: 35 % — ABNORMAL LOW (ref 39.0–52.0)
Hemoglobin: 11 g/dL — ABNORMAL LOW (ref 13.0–17.0)
Immature Granulocytes: 16 %
Lymphocytes Relative: 16 %
Lymphs Abs: 1 10*3/uL (ref 0.7–4.0)
MCH: 26.4 pg (ref 26.0–34.0)
MCHC: 31.4 g/dL (ref 30.0–36.0)
MCV: 83.9 fL (ref 80.0–100.0)
Monocytes Absolute: 0.4 10*3/uL (ref 0.1–1.0)
Monocytes Relative: 6 %
Neutro Abs: 3.3 10*3/uL (ref 1.7–7.7)
Neutrophils Relative %: 56 %
Platelets: 321 10*3/uL (ref 150–400)
RBC: 4.17 MIL/uL — ABNORMAL LOW (ref 4.22–5.81)
RDW: 19.8 % — ABNORMAL HIGH (ref 11.5–15.5)
Smear Review: NORMAL
WBC: 5.8 10*3/uL (ref 4.0–10.5)
nRBC: 5 % — ABNORMAL HIGH (ref 0.0–0.2)

## 2022-01-17 LAB — COMPREHENSIVE METABOLIC PANEL
ALT: 14 U/L (ref 0–44)
AST: 22 U/L (ref 15–41)
Albumin: 3.6 g/dL (ref 3.5–5.0)
Alkaline Phosphatase: 136 U/L — ABNORMAL HIGH (ref 38–126)
Anion gap: 11 (ref 5–15)
BUN: 15 mg/dL (ref 8–23)
CO2: 24 mmol/L (ref 22–32)
Calcium: 8.9 mg/dL (ref 8.9–10.3)
Chloride: 106 mmol/L (ref 98–111)
Creatinine, Ser: 0.94 mg/dL (ref 0.61–1.24)
GFR, Estimated: >60 mL/min (ref >60)
Glucose, Bld: 112 mg/dL — ABNORMAL HIGH (ref 70–99)
Potassium: 4 mmol/L (ref 3.5–5.1)
Sodium: 141 mmol/L (ref 135–145)
Total Bilirubin: 1.4 mg/dL — ABNORMAL HIGH (ref 0.3–1.2)
Total Protein: 7 g/dL (ref 6.5–8.1)

## 2022-01-17 NOTE — ED Triage Notes (Signed)
Per pt and wife-pt was seen at [facility] yesterday/has wound to right foot-had blood work drawn-wife states she was called last night and advised pt had low hgb and to go to ED-pt NAD-slow gait with own walker

## 2022-01-17 NOTE — ED Notes (Signed)
ED Provider at bedside. 

## 2022-01-17 NOTE — ED Provider Notes (Signed)
[facility] EMERGENCY DEPARTMENT Provider Note   CSN: [id number] Arrival date & time: 01/17/22  1333     History  Chief Complaint  Patient presents with   Abnormal Lab    Luke Rivera is a 81 y.o. male sent here by PCP with concern for abnormal blood tests.  His wife at bedside provides additional hx.  States they had routine bloodwork through [place] yesterday and were called today, told his hemoglobin is "10.7, which is way too low, a normal count should be 14, and go straight to the ER."  The patient is on iron for several weeks for anemia.  He is asymptomatic, feels well, no lightheadedness or SOB.  He is on eliquis and plavix for cardiac and stroke history.  He has had dark stools ever since starting iron weeks ago.  No gross blood in stools.  HPI     Home Medications Prior to Admission medications   Medication Sig Start Date End Date Taking? Authorizing Provider  ferrous sulfate 325 (65 FE) MG tablet Take 325 mg by mouth daily with breakfast.   Yes [provider]  acetaminophen (TYLENOL) 325 MG tablet Take 1-2 tablets (325-650 mg total) by mouth every 4 (four) hours as needed for mild pain. 06/15/19   Love, Ivan Anchors, PA-C  apixaban (ELIQUIS) 5 MG TABS tablet Take 1 tablet (5 mg total) by mouth 2 (two) times daily. 06/07/19   Meccariello, Bernita Raisin, DO  atorvastatin (LIPITOR) 80 MG tablet Take 40 mg by mouth at bedtime.     [provider]  Cholecalciferol (VITAMIN D3) 50 MCG (2000 UT) TABS Take 4,000 Units by mouth daily.    [provider]  citalopram (CELEXA) 10 MG tablet Take 1 tablet (10 mg total) by mouth daily. 07/08/19   Kirsteins, Luanna Salk, MD  clopidogrel (PLAVIX) 75 MG tablet Take 1 tablet (75 mg total) by mouth daily. 06/29/19   Love, Ivan Anchors, PA-C  CYANOCOBALAMIN PO Take 5,000 Units by mouth every Sunday.    [provider]  diclofenac sodium (VOLTAREN) 1 % GEL Apply 2 g topically 4 (four) times daily. 06/29/19   Love, Ivan Anchors,  PA-C  insulin glargine (LANTUS) 100 UNIT/ML injection Inject 0.22 mLs (22 Units total) into the skin at bedtime. 07/02/19   Love, Ivan Anchors, PA-C  latanoprost (XALATAN) 0.005 % ophthalmic solution Place 1 drop into the right eye at bedtime.    [provider]  lidocaine (XYLOCAINE) 5 % ointment Apply 1 application topically See admin instructions. Apply twice a day to painful area on toes 03/18/21   [provider]  metFORMIN (GLUCOPHAGE) 500 MG tablet Take 500 mg by mouth 2 (two) times daily with a meal.    [provider]  Multiple Vitamin (MULTIVITAMIN ADULT PO) Take 1 tablet by mouth daily. 09/03/07   [provider]  pantoprazole (PROTONIX) 40 MG tablet Take 40 mg by mouth daily.    [provider]  potassium chloride (KLOR-CON) 10 MEQ tablet Take 10 mEq by mouth daily.    [provider]  potassium chloride SA (KLOR-CON) 20 MEQ tablet Take 20 mEq by mouth daily.    [provider]  PRESCRIPTION MEDICATION Apply 1 application topically 3 (three) times daily as needed for irritation. 04/16/21   [provider]  sodium chloride irrigation 0.9 % irrigation Apply 15 mLs topically See admin instructions. Irrigate 61ml to the affected area daily to wounds and dry surrounding skin 11/22/20   [provider]  timolol (BETIMOL) 0.5 % ophthalmic solution Place 1 drop into the right eye daily.    [provider]  VITAMIN D PO Take 1 tablet by mouth daily. 02/28/10   [provider]      Allergies    Patient has no known allergies.    Review of Systems   Review of Systems  Physical Exam Updated Vital Signs BP (!) 158/68 (BP Location: Left Arm)    Pulse 80    Temp 98 F (36.7 C) (Oral)    Resp 18    Ht 6\' 3"  (1.905 m)    Wt 97.1 kg    SpO2 91%    BMI 26.75 kg/m  Physical Exam Constitutional:      General: He is not in acute distress. HENT:     Head: Normocephalic and atraumatic.  Eyes:      Conjunctiva/sclera: Conjunctivae normal.     Pupils: Pupils are equal, round, and reactive to light.  Cardiovascular:     Rate and Rhythm: Normal rate. Rhythm irregular.  Pulmonary:     Effort: Pulmonary effort is normal. No respiratory distress.  Abdominal:     General: There is no distension.     Tenderness: There is no abdominal tenderness.  Skin:    General: Skin is warm and dry.  Neurological:     General: No focal deficit present.     Mental Status: He is alert. Mental status is at baseline.  Psychiatric:        Mood and Affect: Mood normal.        Behavior: Behavior normal.    ED Results / Procedures / Treatments   Labs (all labs ordered are listed, but only abnormal results are displayed) Labs Reviewed  CBC WITH DIFFERENTIAL/PLATELET - Abnormal; Notable for the following components:      Result Value   RBC 4.17 (*)    Hemoglobin 11.0 (*)    HCT 35.0 (*)    RDW 19.8 (*)    nRBC 5.0 (*)    Basophils Absolute 0.2 (*)    Abs Immature Granulocytes 0.94 (*)    All other components within normal limits  COMPREHENSIVE METABOLIC PANEL - Abnormal; Notable for the following components:   Glucose, Bld 112 (*)    Alkaline Phosphatase 136 (*)    Total Bilirubin 1.4 (*)    All other components within normal limits    EKG None  Radiology No results found.  Procedures Procedures    Medications Ordered in ED Medications - No data to display  ED Course/ Medical Decision Making/ A&P Clinical Course as of 01/17/22 1820  Thu Jan 17, 2022  1557 WBC wnl, no blast crisis [MT]    Clinical Course User Index [MT] Normand Damron, Carola Rhine, MD                           Medical Decision Making Amount and/or Complexity of Data Reviewed Labs: ordered.   This patient presents to the ED with concern for abnormal labs, specifically mention of anemia and report of "blast crisis."  Hgb 11.0 - per review of external records in our system, this was his baseline level 7 months ago  (10-11).  I doubt acute GI bleed or symptomatic anemia.  Patient feels well.  I explained his levels appear stable, no indication for emergency transfusion.    WBC shows wnl.  Additional history obtained from patient's wife at bedside  The patient  was maintained on a cardiac monitor.  I personally viewed and interpreted the cardiac monitored which showed an underlying rhythm of: NSR   Test Considered: doubt acute GI hemorrhage   Dispostion:  After consideration of the diagnostic results and the patients response to treatment, I feel that the patent would benefit from outpatient PCP monitoring of HGB.  But at this time his blood level is stable, he is asymptomatic, advised to continue with his iron pills at home.  He and his wife verbalized understanding.         Final Clinical Impression(s) / ED Diagnoses Final diagnoses:  Anemia, unspecified type    Rx / DC Orders ED Discharge Orders     None         Brinsley Wence, Carola Rhine, MD 01/17/22 1820

## 2022-01-17 NOTE — Discharge Instructions (Signed)
Your hemoglobin level is 11.0, which is stable from your old tests, and not an immediate emergency requiring blood transfusion.  Continue taking your iron and other meds.  Follow up with the [facility] again.

## 2022-07-05 IMAGING — CT CT HEAD W/O CM
3 series · 15 of 47 positions shown, 18 images · non-contrast
Comparison: June 01, 2019 MRI

CLINICAL DATA: Syncope

EXAM:
CT HEAD WITHOUT CONTRAST
TECHNIQUE: Contiguous axial images were obtained from the base of the skull
through the vertex without intravenous contrast.

[Series 3: head 5.0 h30s · axial · 0.44mm/px · z∈[-157,+3]mm · 9 of 38 slices shown, 12 images]
[im 3/38  brain]
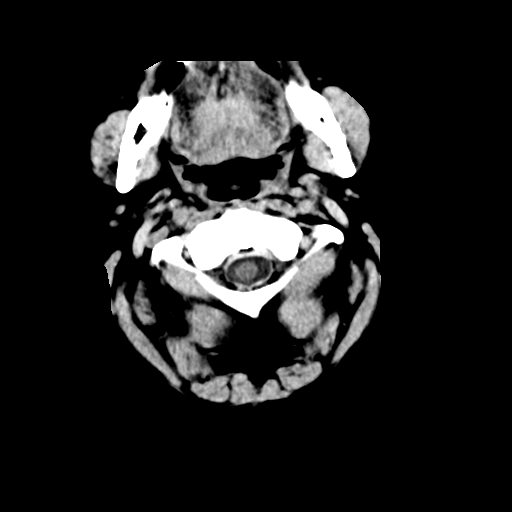
[im 3/38  bone]
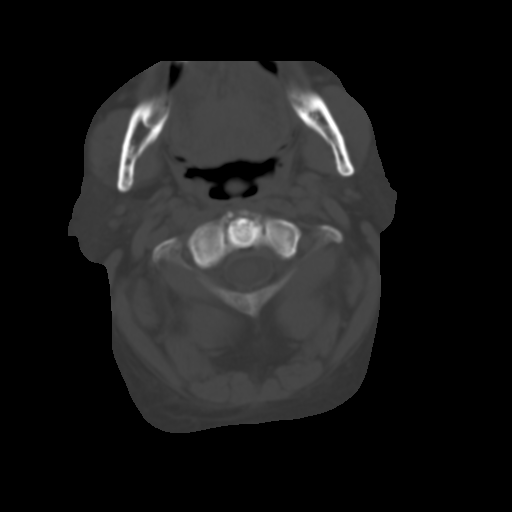
[im 7/38  brain]
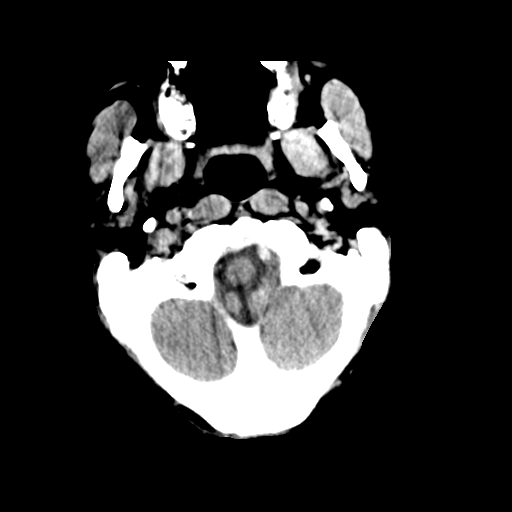
[im 11/38  brain]
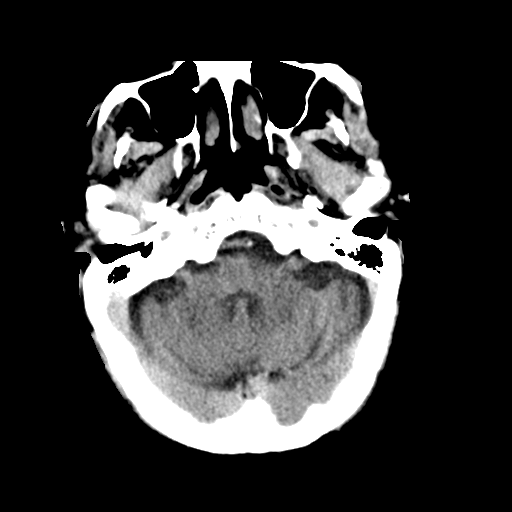
[im 15/38  brain]
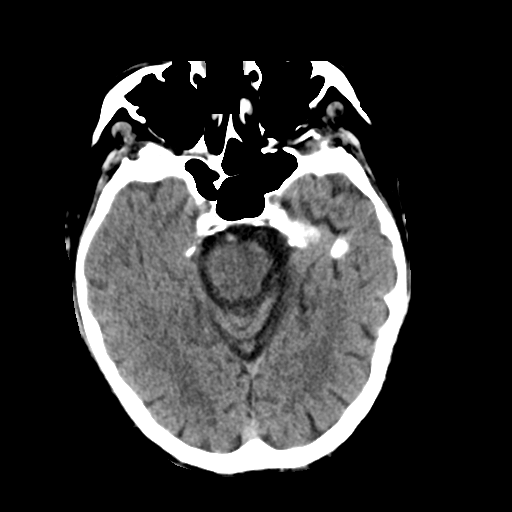
[im 20/38  brain]
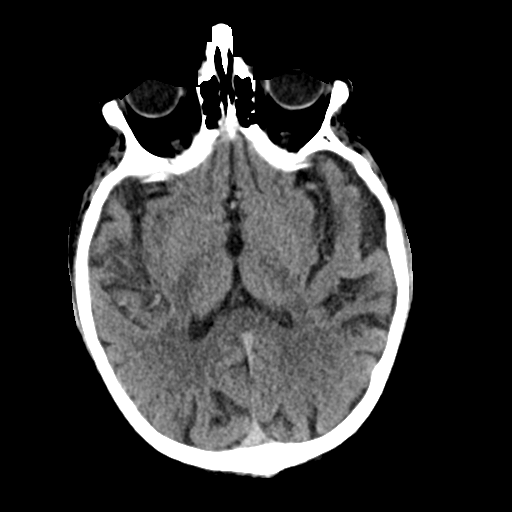
[im 20/38  bone]
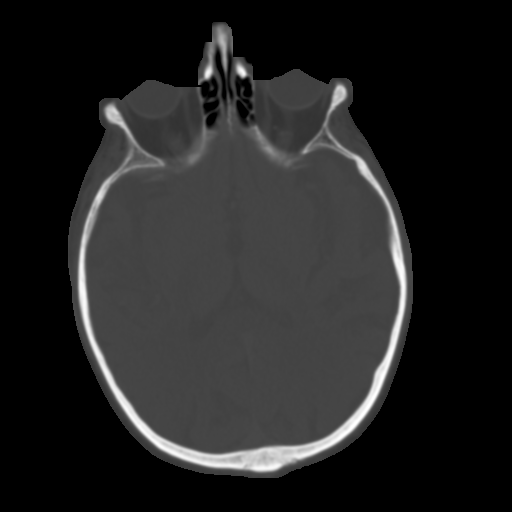
[im 23/38  brain]
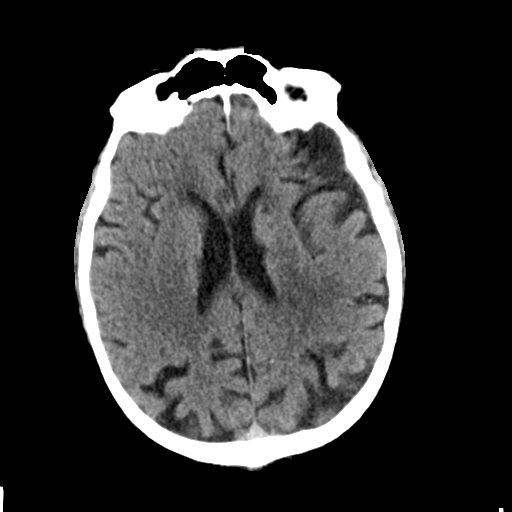
[im 27/38  brain]
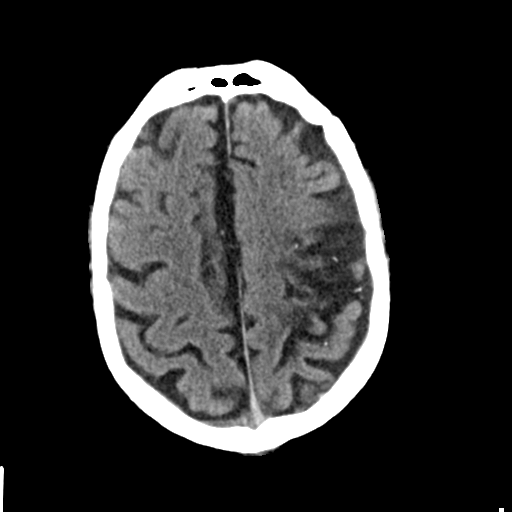
[im 31/38  brain]
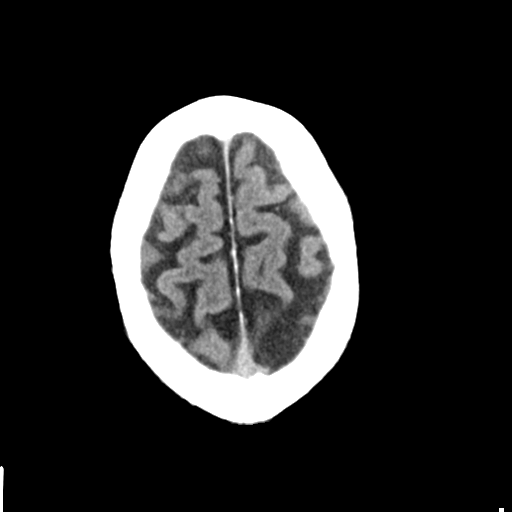
[im 35/38  brain]
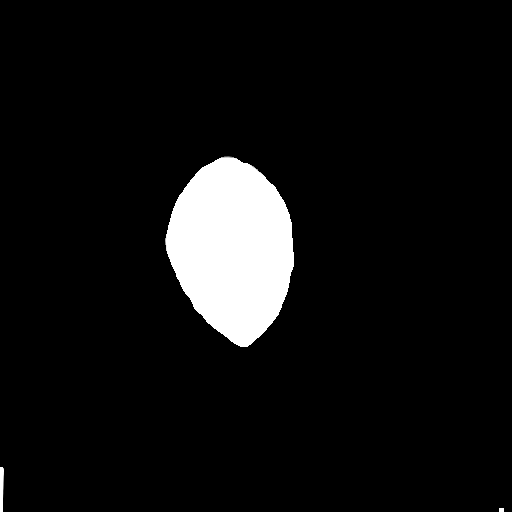
[im 35/38  bone]
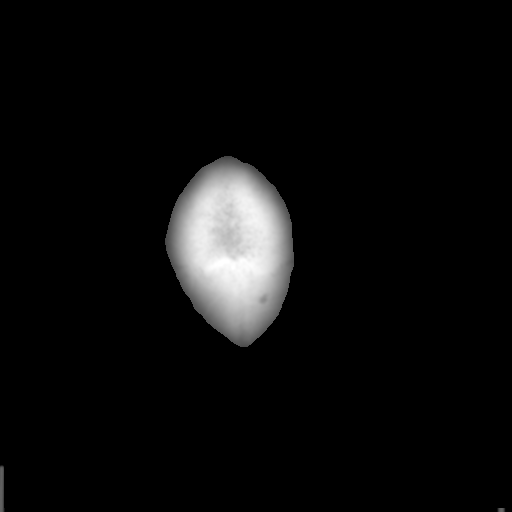

[Series 5: head 3.0 mpr cor · coronal · 0.37mm/px · 3 of 75 slices shown]
[im 25/75  brain]
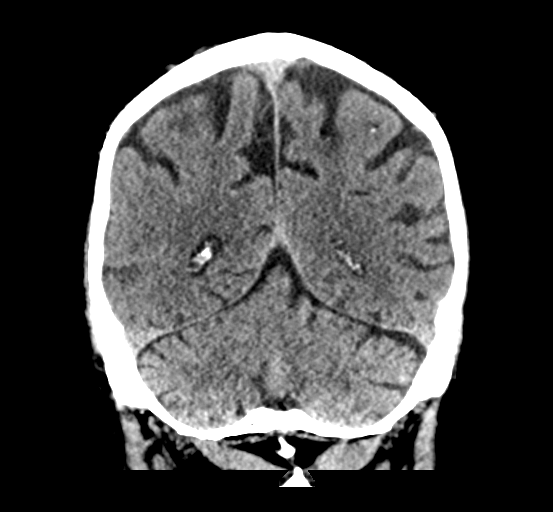
[im 33/75  brain]
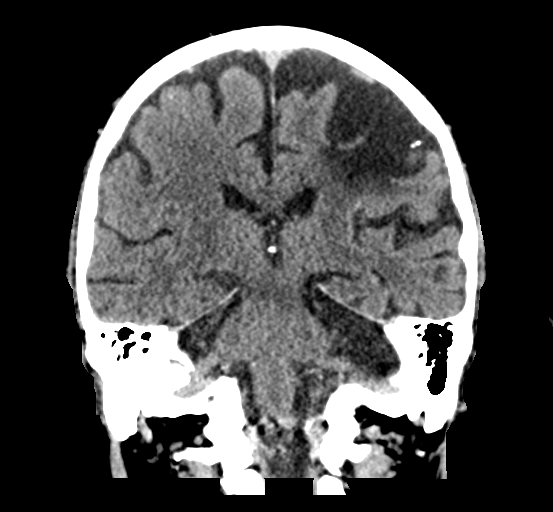
[im 42/75  brain]
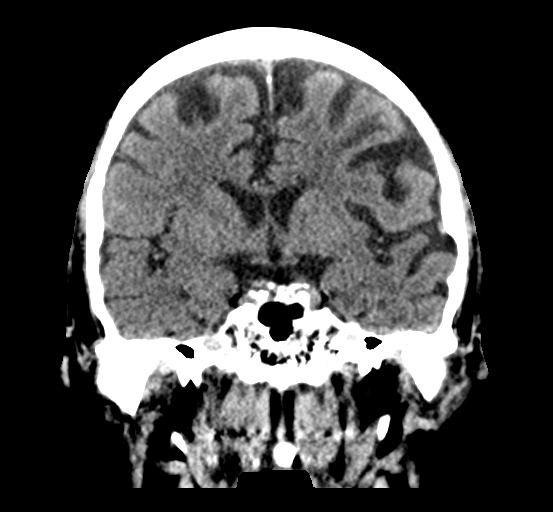

[Series 6: head 3.0 mpr sag · sagittal · 0.37mm/px · 3 of 65 slices shown]
[im 22/65  brain]
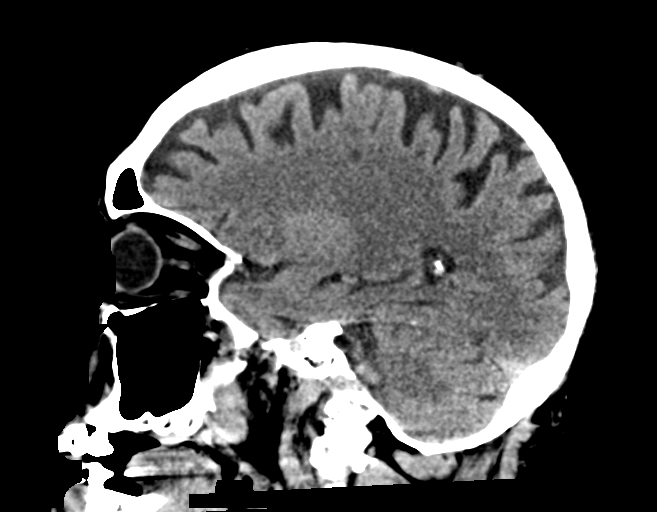
[im 33/65  brain]
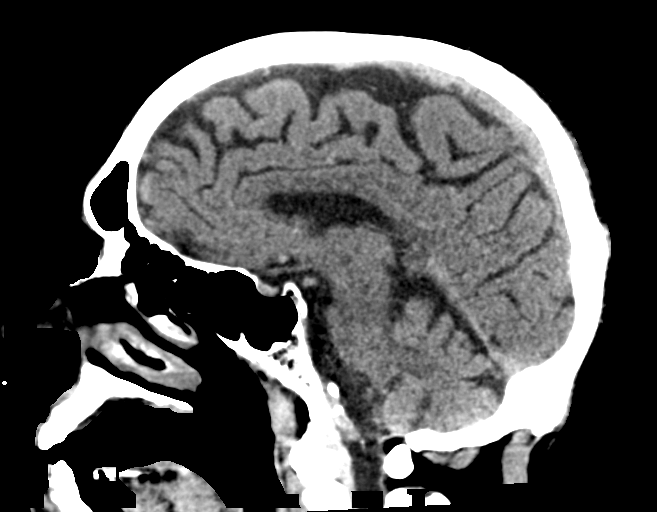
[im 43/65  brain]
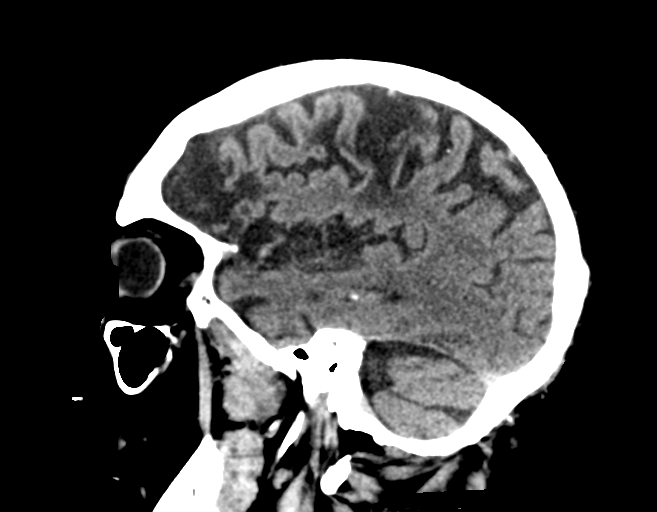

[15 of 47 positions shown; findings below may reference images not displayed]

FINDINGS: Brain: No evidence of acute territorial infarction, hemorrhage,
hydrocephalus,extra-axial collection or mass lesion/mass effect.
There is dilatation the ventricles and sulci consistent with
age-related atrophy. Low-attenuation changes in the deep white
matter consistent with small vessel ischemia. Area of
encephalomalacia involving the superior left frontoparietal lobe.

Vascular: Calcifications are seen within the internal carotid
artery.

Skull: The skull is intact. No fracture or focal lesion identified.

Sinuses/Orbits: The visualized paranasal sinuses and mastoid air
cells are clear. The orbits and globes intact.

Other: None
IMPRESSION: No acute intracranial abnormality.

Findings consistent with age related atrophy and chronic small
vessel ischemia

Area of encephalomalacia involving the left frontoparietal lobe.

## 2023-02-16 ENCOUNTER — Encounter (HOSPITAL_BASED_OUTPATIENT_CLINIC_OR_DEPARTMENT_OTHER): Payer: Self-pay | Admitting: Emergency Medicine

## 2023-02-16 ENCOUNTER — Emergency Department (HOSPITAL_BASED_OUTPATIENT_CLINIC_OR_DEPARTMENT_OTHER): Payer: Medicare HMO

## 2023-02-16 ENCOUNTER — Other Ambulatory Visit: Payer: Self-pay

## 2023-02-16 ENCOUNTER — Emergency Department (HOSPITAL_BASED_OUTPATIENT_CLINIC_OR_DEPARTMENT_OTHER)
Admission: EM | Admit: 2023-02-16 | Discharge: 2023-02-16 | Disposition: A | Discharge status: Home / Self Care | Payer: Medicare HMO | Attending: Emergency Medicine | Admitting: Emergency Medicine

## 2023-02-16 DIAGNOSIS — I4891 Unspecified atrial fibrillation: Secondary | ICD-10-CM | POA: Insufficient documentation

## 2023-02-16 DIAGNOSIS — R197 Diarrhea, unspecified: Secondary | ICD-10-CM | POA: Diagnosis present

## 2023-02-16 DIAGNOSIS — Z7901 Long term (current) use of anticoagulants: Secondary | ICD-10-CM | POA: Diagnosis not present

## 2023-02-16 DIAGNOSIS — E119 Type 2 diabetes mellitus without complications: Secondary | ICD-10-CM | POA: Diagnosis not present

## 2023-02-16 DIAGNOSIS — D709 Neutropenia, unspecified: Secondary | ICD-10-CM | POA: Diagnosis not present

## 2023-02-16 DIAGNOSIS — Z8673 Personal history of transient ischemic attack (TIA), and cerebral infarction without residual deficits: Secondary | ICD-10-CM | POA: Diagnosis not present

## 2023-02-16 DIAGNOSIS — Z7984 Long term (current) use of oral hypoglycemic drugs: Secondary | ICD-10-CM | POA: Diagnosis not present

## 2023-02-16 DIAGNOSIS — I509 Heart failure, unspecified: Secondary | ICD-10-CM

## 2023-02-16 DIAGNOSIS — D471 Chronic myeloproliferative disease: Secondary | ICD-10-CM | POA: Diagnosis not present

## 2023-02-16 DIAGNOSIS — Z1152 Encounter for screening for COVID-19: Secondary | ICD-10-CM | POA: Diagnosis not present

## 2023-02-16 DIAGNOSIS — R531 Weakness: Secondary | ICD-10-CM | POA: Insufficient documentation

## 2023-02-16 DIAGNOSIS — R112 Nausea with vomiting, unspecified: Secondary | ICD-10-CM | POA: Insufficient documentation

## 2023-02-16 HISTORY — DX: Malignant neoplasm of lymphoid, hematopoietic and related tissue, unspecified: C96.9

## 2023-02-16 LAB — CBC WITH DIFFERENTIAL/PLATELET
Abs Immature Granulocytes: 0 10*3/uL (ref 0.00–0.07)
Basophils Absolute: 0 10*3/uL (ref 0.0–0.1)
Basophils Relative: 2 %
Eosinophils Absolute: 0 10*3/uL (ref 0.0–0.5)
Eosinophils Relative: 6 %
HCT: 33.8 % — ABNORMAL LOW (ref 39.0–52.0)
Hemoglobin: 10.8 g/dL — ABNORMAL LOW (ref 13.0–17.0)
Immature Granulocytes: 0 %
Lymphocytes Relative: 35 %
Lymphs Abs: 0.2 10*3/uL — ABNORMAL LOW (ref 0.7–4.0)
MCH: 28.5 pg (ref 26.0–34.0)
MCHC: 32 g/dL (ref 30.0–36.0)
MCV: 89.2 fL (ref 80.0–100.0)
Monocytes Absolute: 0 10*3/uL — ABNORMAL LOW (ref 0.1–1.0)
Monocytes Relative: 7 %
Neutro Abs: 0.3 10*3/uL — CL (ref 1.7–7.7)
Neutrophils Relative %: 50 %
Platelets: 137 10*3/uL — ABNORMAL LOW (ref 150–400)
RBC: 3.79 MIL/uL — ABNORMAL LOW (ref 4.22–5.81)
RDW: 19.2 % — ABNORMAL HIGH (ref 11.5–15.5)
WBC Morphology: ABNORMAL
WBC: 0.6 10*3/uL — CL (ref 4.0–10.5)
nRBC: 0 % (ref 0.0–0.2)

## 2023-02-16 LAB — COMPREHENSIVE METABOLIC PANEL
ALT: 17 U/L (ref 0–44)
AST: 25 U/L (ref 15–41)
Albumin: 3.6 g/dL (ref 3.5–5.0)
Alkaline Phosphatase: 86 U/L (ref 38–126)
Anion gap: 10 (ref 5–15)
BUN: 26 mg/dL — ABNORMAL HIGH (ref 8–23)
CO2: 22 mmol/L (ref 22–32)
Calcium: 8.3 mg/dL — ABNORMAL LOW (ref 8.9–10.3)
Chloride: 105 mmol/L (ref 98–111)
Creatinine, Ser: 0.94 mg/dL (ref 0.61–1.24)
GFR, Estimated: >60 mL/min (ref >60)
Glucose, Bld: 177 mg/dL — ABNORMAL HIGH (ref 70–99)
Potassium: 3.2 mmol/L — ABNORMAL LOW (ref 3.5–5.1)
Sodium: 137 mmol/L (ref 135–145)
Total Bilirubin: 2 mg/dL — ABNORMAL HIGH (ref 0.3–1.2)
Total Protein: 6.5 g/dL (ref 6.5–8.1)

## 2023-02-16 LAB — RESP PANEL BY RT-PCR (RSV, FLU A&B, COVID)  RVPGX2
Influenza A by PCR: NEGATIVE
Influenza B by PCR: NEGATIVE
Resp Syncytial Virus by PCR: NEGATIVE
SARS Coronavirus 2 by RT PCR: NEGATIVE

## 2023-02-16 LAB — LIPASE, BLOOD: Lipase: 26 U/L (ref 11–51)

## 2023-02-16 MED ORDER — SODIUM CHLORIDE 0.9 % IV BOLUS
1000.0000 mL | Freq: Once | INTRAVENOUS | Status: AC
  Administered 2023-02-16: 1000 mL via INTRAVENOUS

## 2023-02-16 MED ORDER — IOHEXOL 300 MG/ML  SOLN
100.0000 mL | Freq: Once | INTRAMUSCULAR | Status: AC | PRN
  Administered 2023-02-16: 100 mL via INTRAVENOUS

## 2023-02-16 NOTE — ED Provider Notes (Signed)
[facility] EMERGENCY DEPARTMENT AT [place] Provider Note   CSN: [id number] Arrival date & time: 02/16/23  1100     History {Add pertinent medical, surgical, social history, OB history to HPI:1} Chief Complaint  Patient presents with   Emesis    Lenvil Gabourel is a 82 y.o. male with history of myeloproliferative disorder with JAK2 mutation, who follows at the [facility] for this, history of stroke, A-fib, on Eliquis, presenting to the ED in the company of his wife concern for nausea vomiting and diarrhea.  His wife reports that she had an episode of vomiting and felt unwell 3 days ago after eating meatloaf for lunch at a restaurant.  The patient was okay at that time.  However last night the patient began to feel nauseated and vomited and then has had about 6 loose bowel movements overnight.  He feels fatigued.  Patient follows at the [facility].  Records found on care everywhere from 02/13/23 show WBC 0.81, Hgb 10.2, neutrophils 0.15, platelets 134  The patient is diabetic he has been able to control his blood sugars predominantly with diet, is now on a single oral tablet daily, no longer on insulin.  HPI     Home Medications Prior to Admission medications   Medication Sig Start Date End Date Taking? Authorizing Provider  acetaminophen (TYLENOL) 325 MG tablet Take 1-2 tablets (325-650 mg total) by mouth every 4 (four) hours as needed for mild pain. 06/15/19   Love, Ivan Anchors, PA-C  apixaban (ELIQUIS) 5 MG TABS tablet Take 1 tablet (5 mg total) by mouth 2 (two) times daily. 06/07/19   Meccariello, Bernita Raisin, MD  atorvastatin (LIPITOR) 80 MG tablet Take 40 mg by mouth at bedtime.     [provider]  Cholecalciferol (VITAMIN D3) 50 MCG (2000 UT) TABS Take 4,000 Units by mouth daily.    [provider]  citalopram (CELEXA) 10 MG tablet Take 1 tablet (10 mg total) by mouth daily. 07/08/19   Kirsteins, Luanna Salk, MD  clopidogrel (PLAVIX) 75 MG tablet Take 1 tablet  (75 mg total) by mouth daily. 06/29/19   Love, Ivan Anchors, PA-C  CYANOCOBALAMIN PO Take 5,000 Units by mouth every Sunday.    [provider]  diclofenac sodium (VOLTAREN) 1 % GEL Apply 2 g topically 4 (four) times daily. 06/29/19   Love, Ivan Anchors, PA-C  ferrous sulfate 325 (65 FE) MG tablet Take 325 mg by mouth daily with breakfast.    [provider]  insulin glargine (LANTUS) 100 UNIT/ML injection Inject 0.22 mLs (22 Units total) into the skin at bedtime. 07/02/19   Love, Ivan Anchors, PA-C  latanoprost (XALATAN) 0.005 % ophthalmic solution Place 1 drop into the right eye at bedtime.    [provider]  lidocaine (XYLOCAINE) 5 % ointment Apply 1 application topically See admin instructions. Apply twice a day to painful area on toes 03/18/21   [provider]  metFORMIN (GLUCOPHAGE) 500 MG tablet Take 500 mg by mouth 2 (two) times daily with a meal.    [provider]  Multiple Vitamin (MULTIVITAMIN ADULT PO) Take 1 tablet by mouth daily. 09/03/07   [provider]  pantoprazole (PROTONIX) 40 MG tablet Take 40 mg by mouth daily.    [provider]  potassium chloride (KLOR-CON) 10 MEQ tablet Take 10 mEq by mouth daily.    [provider]  potassium chloride SA (KLOR-CON) 20 MEQ tablet Take 20 mEq by mouth daily.  [provider]  PRESCRIPTION MEDICATION Apply 1 application topically 3 (three) times daily as needed for irritation. 04/16/21   [provider]  sodium chloride irrigation 0.9 % irrigation Apply 15 mLs topically See admin instructions. Irrigate 28m to the affected area daily to wounds and dry surrounding skin 11/22/20   [provider]  timolol (BETIMOL) 0.5 % ophthalmic solution Place 1 drop into the right eye daily.    [provider]  VITAMIN D PO Take 1 tablet by mouth daily. 02/28/10   [provider]      Allergies    Patient has no known allergies.    Review of Systems    Review of Systems  Physical Exam Updated Vital Signs BP 126/69 (BP Location: Left Arm)   Pulse 88   Temp (!) 97.4 F (36.3 C) (Oral)   Resp (!) 22   Ht '6\' 3"'$  (1.905 m)   Wt 88.9 kg   SpO2 98%   BMI 24.50 kg/m  Physical Exam Constitutional:      General: He is not in acute distress. HENT:     Head: Normocephalic and atraumatic.  Eyes:     Conjunctiva/sclera: Conjunctivae normal.     Pupils: Pupils are equal, round, and reactive to light.  Cardiovascular:     Rate and Rhythm: Normal rate and regular rhythm.  Pulmonary:     Effort: Pulmonary effort is normal. No respiratory distress.  Abdominal:     General: There is no distension.     Tenderness: There is no abdominal tenderness.  Skin:    General: Skin is warm and dry.  Neurological:     General: No focal deficit present.     Mental Status: He is alert. Mental status is at baseline.  Psychiatric:        Mood and Affect: Mood normal.        Behavior: Behavior normal.     ED Results / Procedures / Treatments   Labs (all labs ordered are listed, but only abnormal results are displayed) Labs Reviewed  RESP PANEL BY RT-PCR (RSV, FLU A&B, COVID)  [id number]  COMPREHENSIVE METABOLIC PANEL  CBC WITH DIFFERENTIAL/PLATELET  LIPASE, BLOOD    EKG None  Radiology No results found.  Procedures Procedures  {Document cardiac monitor, telemetry assessment procedure when appropriate:1}  Medications Ordered in ED Medications  sodium chloride 0.9 % bolus 1,000 mL (has no administration in time range)    ED Course/ Medical Decision Making/ A&P   {   Click here for ABCD2, HEART and other calculatorsREFRESH Note before signing :1}                          Medical Decision Making Amount and/or Complexity of Data Reviewed Labs: ordered. Radiology: ordered.  Risk Prescription drug management.   This patient presents to the ED with concern for diarrhea, nausea. This involves an extensive number of treatment options,  and is a complaint that carries with it a high risk of complications and morbidity.  The differential diagnosis includes viral gastroenteritis versus colitis versus foodborne illness versus other  Co-morbidities that complicate the patient evaluation: Myeloproliferative disorder  Additional history obtained from patient's wife at bedside  I ordered and personally interpreted labs.  The pertinent results include: Patient has neutropenia which is largely [facility]  Hemoglobin stable at 10.8.  Platelets are also stable at 137.  He has very  mild hypokalemia with potassium 3.2.  Lipase within normal limits.  COVID and flu are negative.  I ordered imaging studies including xray chest & CT abdomen pelvis, which was pending at the time of signout  The patient was maintained on a cardiac monitor.  I personally viewed and interpreted the cardiac monitored which showed an underlying rhythm of: NSR  I ordered medication including IV fluids for hydration I have reviewed the patients home medicines and have made adjustments as needed  After the interventions noted above, I reevaluated the patient and found that they have: improved -patient reports that he feels better after his IV fluid bolus.  He has not had diarrhea the initial 4 hours here in the ED.   Dispostion:  Patient is signed out to Dr. Raliegh Ip. [place] EDP at 3 pm pending f/u on CT imaging.   If there are no emergent findings on the patient's radiographic imaging regarding the possibility of infectious source, I think would be reasonable to discharge the patient home, if he and his wife are comfortable going home.    He may be experiencing a viral illness which caused nausea vomiting and diarrhea, but is typically short last.  He has noted to have chronic [facility]rop[facility], and is immunocompromise, but I do not see evidence otherwise of infection, his rectal  temperature is normal at 98.68F.   {Document critical care time when appropriate:1} {Document review of labs and clinical decision tools ie heart score, Chads2Vasc2 etc:1}  {Document your independent review of radiology images, and any outside records:1} {Document your discussion with family members, caretakers, and with consultants:1} {Document social determinants of health affecting pt's care:1} {Document your decision making why or why not admission, treatments were needed:1} Final Clinical Impression(s) / ED Diagnoses Final diagnoses:  None    Rx / DC Orders ED Discharge Orders     None

## 2023-02-16 NOTE — Discharge Instructions (Addendum)
You have been seen and discharged from the emergency department.  Your flu, COVID and RSV swab were negative.  Your blood work was baseline for you.  Your CT showed mild splenomegaly but no other acute findings.  Stay well-hydrated.  Follow-up bland diet.  Follow-up with your primary provider for further evaluation and further care. Take home medications as prescribed. If you have any worsening symptoms or further concerns for your health please return to an emergency department for further evaluation.

## 2023-02-16 NOTE — ED Provider Notes (Signed)
Patient signed out to me by previous provider. Please refer to their note for full HPI.  Briefly this is an 82 year old male with past medical history of myeloproliferative disorder currently following at the [facility] who presented with weakness, nausea/vomiting/diarrhea.  There has been GI illness in the family.  Workup thus far is consistent with leukopenia which is baseline for the patient as well as other mild abnormalities.  Signout is pending CT scan to rule out any other acute abdominal pathology.  CT shows mild splenomegaly with no other acute finding.  Patient is been able to eat and drink the department out difficulty.  He is requesting to be discharged, has no further complaints. Most likely viral process.  Patient at this time appears safe and stable for discharge and close outpatient follow up. Discharge plan and strict return to ED precautions discussed, patient verbalizes understanding and agreement.   Lorelle Gibbs, DO 02/16/23 1742

## 2023-02-16 NOTE — ED Triage Notes (Signed)
Pt in with c/o vomiting, diarrhea and weakness onset last night. Pt had a birthday party yesterday and is immunocompromised.

## 2023-02-16 NOTE — ED Notes (Signed)
ED Provider at bedside. 

## 2023-02-18 LAB — PATHOLOGIST SMEAR REVIEW: Path Review: NEGATIVE

## 2023-05-10 DEATH — deceased

## 2023-06-05 IMAGING — DX DG CHEST 1V PORT
1 series · 1 of 1 positions shown · non-contrast
Comparison: Prior radiograph from earlier the same day.

CLINICAL DATA: Initial evaluation for acute COVID pneumonia, cough.

EXAM:
PORTABLE CHEST 1 VIEW

[chest ap]
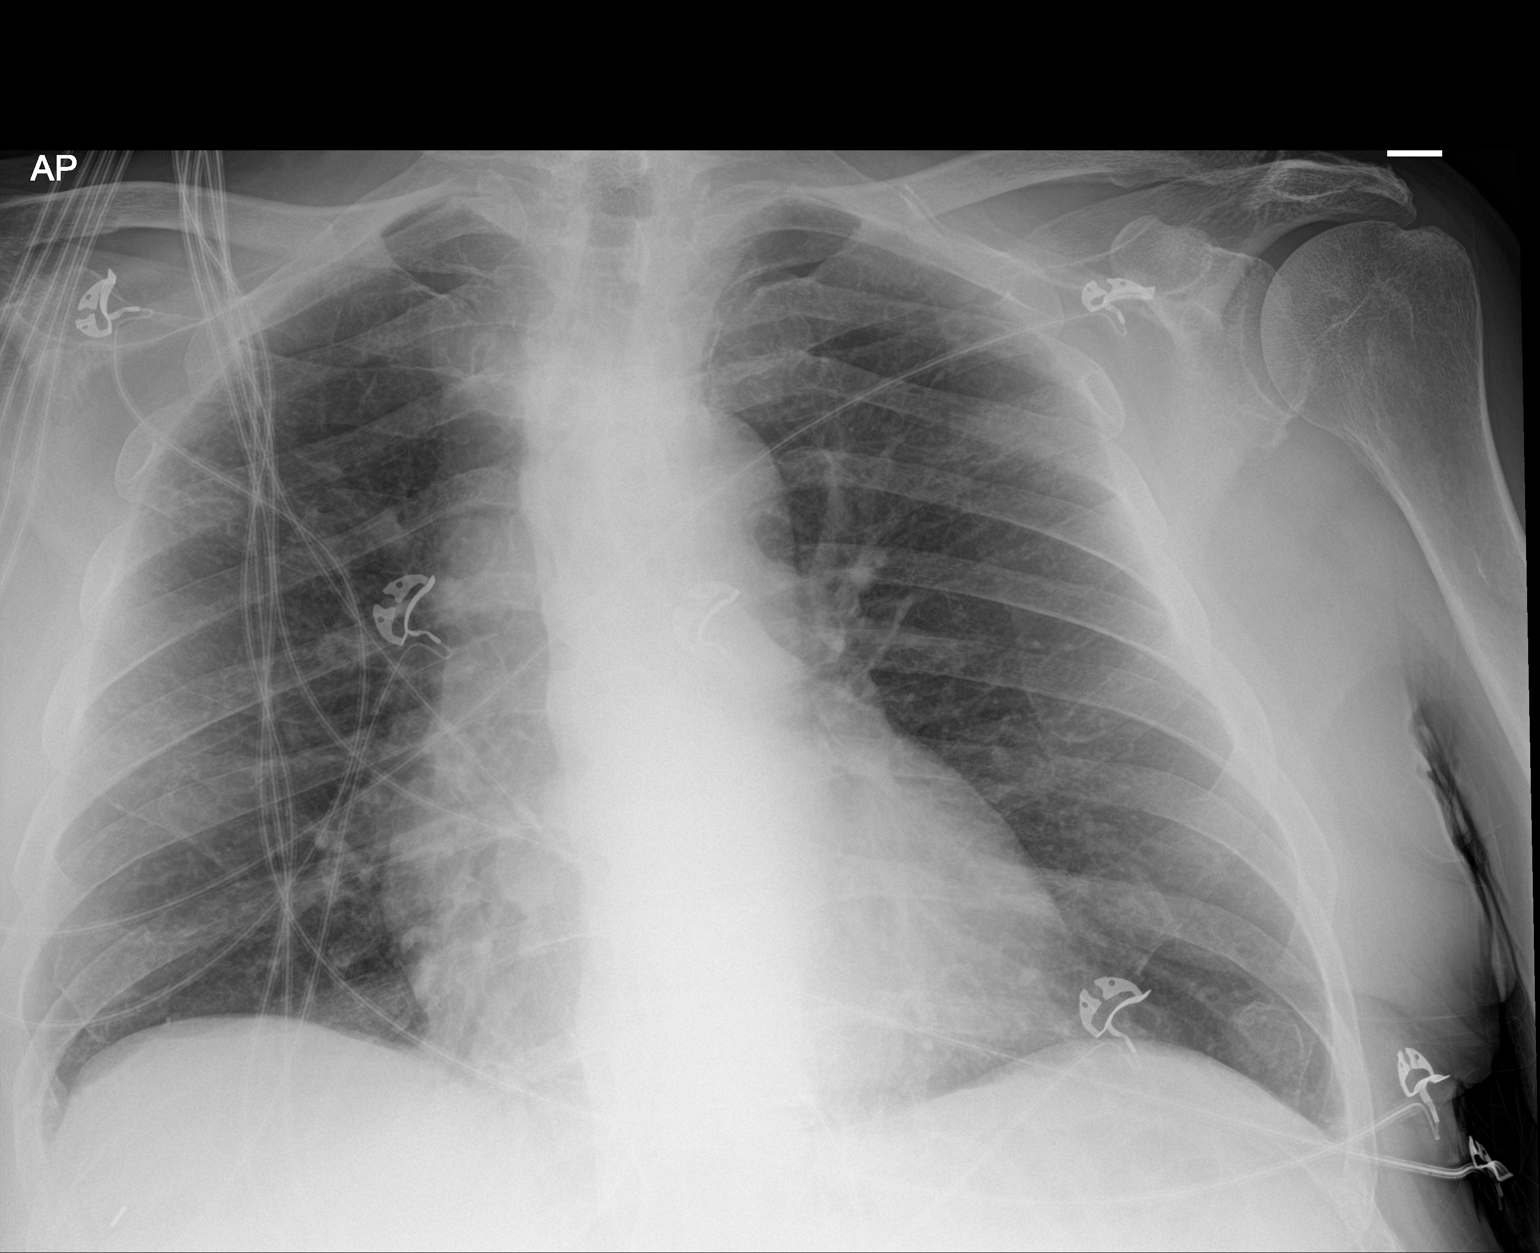

[1 of 1 positions shown; findings below may reference images not displayed]

FINDINGS: Transverse heart size at the upper limits of normal. Mediastinal
silhouette within normal limits.

Lungs normally inflated. Mild diffuse interstitial prominence,
suspected to be related history of viral pneumonitis. No frank
airspace consolidation. No edema or effusion. No pneumothorax.

Visualized osseous structures demonstrate no acute finding.
IMPRESSION: Mild diffuse interstitial prominence, likely related to history of
acute viral pneumonitis. No frank airspace consolidation to suggest
bronchopneumonia.
